# Patient Record
Sex: Male | Born: 1950
Health system: Southern US, Community
[De-identification: ages and names within clinical notes are randomized; demographics above are authoritative.]

## PROBLEM LIST (undated history)

## (undated) DIAGNOSIS — C61 Malignant neoplasm of prostate: Secondary | ICD-10-CM

## (undated) DIAGNOSIS — J189 Pneumonia, unspecified organism: Secondary | ICD-10-CM

## (undated) DIAGNOSIS — E785 Hyperlipidemia, unspecified: Secondary | ICD-10-CM

## (undated) DIAGNOSIS — Z87891 Personal history of nicotine dependence: Secondary | ICD-10-CM

## (undated) DIAGNOSIS — I2510 Atherosclerotic heart disease of native coronary artery without angina pectoris: Secondary | ICD-10-CM

## (undated) DIAGNOSIS — B019 Varicella without complication: Secondary | ICD-10-CM

## (undated) DIAGNOSIS — I219 Acute myocardial infarction, unspecified: Secondary | ICD-10-CM

## (undated) DIAGNOSIS — S022XXA Fracture of nasal bones, initial encounter for closed fracture: Secondary | ICD-10-CM

## (undated) DIAGNOSIS — I10 Essential (primary) hypertension: Secondary | ICD-10-CM

## (undated) DIAGNOSIS — R55 Syncope and collapse: Secondary | ICD-10-CM

## (undated) DIAGNOSIS — E876 Hypokalemia: Secondary | ICD-10-CM

## (undated) DIAGNOSIS — M1990 Unspecified osteoarthritis, unspecified site: Secondary | ICD-10-CM

## (undated) DIAGNOSIS — S14109A Unspecified injury at unspecified level of cervical spinal cord, initial encounter: Secondary | ICD-10-CM

## (undated) DIAGNOSIS — M5430 Sciatica, unspecified side: Secondary | ICD-10-CM

## (undated) DIAGNOSIS — Z800 Family history of malignant neoplasm of digestive organs: Secondary | ICD-10-CM

## (undated) DIAGNOSIS — Z803 Family history of malignant neoplasm of breast: Secondary | ICD-10-CM

## (undated) HISTORY — DX: Pneumonia, unspecified organism: J18.9

## (undated) HISTORY — DX: Atherosclerotic heart disease of native coronary artery without angina pectoris: I25.10

## (undated) HISTORY — DX: Family history of malignant neoplasm of breast: Z80.3

## (undated) HISTORY — DX: Hypokalemia: E87.6

## (undated) HISTORY — DX: Essential (primary) hypertension: I10

## (undated) HISTORY — DX: Hyperlipidemia, unspecified: E78.5

## (undated) HISTORY — DX: Varicella without complication: B01.9

## (undated) HISTORY — DX: Acute myocardial infarction, unspecified: I21.9

## (undated) HISTORY — PX: OTHER SURGICAL HISTORY: SHX169

## (undated) HISTORY — DX: Family history of malignant neoplasm of digestive organs: Z80.0

---

## 1987-08-01 HISTORY — PX: ACHILLES TENDON SURGERY: SHX542

## 1987-08-01 HISTORY — PX: OTHER SURGICAL HISTORY: SHX169

## 1998-06-27 ENCOUNTER — Inpatient Hospital Stay (HOSPITAL_COMMUNITY): Admission: EM | Admit: 1998-06-27 | Discharge: 1998-06-29 | Payer: Self-pay | Admitting: Emergency Medicine

## 1998-06-27 ENCOUNTER — Encounter: Payer: Self-pay | Admitting: Cardiology

## 1998-06-28 HISTORY — PX: CARDIAC CATHETERIZATION: SHX172

## 1998-09-15 ENCOUNTER — Encounter: Payer: Self-pay | Admitting: Cardiovascular Disease

## 1998-09-15 ENCOUNTER — Ambulatory Visit (HOSPITAL_COMMUNITY): Admission: RE | Admit: 1998-09-15 | Discharge: 1998-09-15 | Payer: Self-pay | Admitting: Cardiovascular Disease

## 2001-06-12 ENCOUNTER — Emergency Department (HOSPITAL_COMMUNITY): Admission: EM | Admit: 2001-06-12 | Discharge: 2001-06-12 | Payer: Self-pay | Admitting: Emergency Medicine

## 2001-07-09 ENCOUNTER — Ambulatory Visit (HOSPITAL_COMMUNITY): Admission: RE | Admit: 2001-07-09 | Discharge: 2001-07-09 | Payer: Self-pay | Admitting: Gastroenterology

## 2003-04-07 ENCOUNTER — Emergency Department (HOSPITAL_COMMUNITY): Admission: EM | Admit: 2003-04-07 | Discharge: 2003-04-07 | Payer: Self-pay

## 2008-05-15 ENCOUNTER — Emergency Department (HOSPITAL_BASED_OUTPATIENT_CLINIC_OR_DEPARTMENT_OTHER): Admission: EM | Admit: 2008-05-15 | Discharge: 2008-05-15 | Payer: Self-pay | Admitting: Emergency Medicine

## 2009-05-17 HISTORY — PX: NM MYOVIEW LTD: HXRAD82

## 2010-12-16 NOTE — Procedures (Signed)
[facility]  Patient:    Edward [name], Edward [name] Visit Number: [id number] MRN: [id number]          Service Type: END Location: ENDO Attending Physician:  Louie Bun Dictated by:   Everardo All Madilyn Fireman, M.D. Proc. Date: 07/09/01 Admit Date:  07/09/2001   CC:         Majerus Dull, M.D.   Procedure Report  PROCEDURE:  Colonoscopy.  ENDOSCOPIST:  Everardo All. Madilyn Fireman, M.D.  INDICATIONS:  Rectal bleeding in a 60 year old patient with no previous colon screening.  DESCRIPTION OF PROCEDURE:  The patient was placed in the left lateral decubitus position and placed on the pulse monitor with continuous low flow oxygen delivered by nasal cannula.  He was sedated with 70 mg of IV Demerol and 6 mg of IV Versed.  The Olympus video colonoscope was inserted into the rectum and advanced to the cecum, confirmed by transillumination of McBurneys point and visualization of the ileocecal valve and appendiceal orifice.  The prep was excellent.  The cecum, ascending, transverse, descending and sigmoid colon all appeared normal with no masses, polyps, diverticula or other mucosal abnormalities.  The rectum likewise appeared normal, and retroflexed view of the anus revealed only small internal hemorrhoids.  The colonoscope was then withdrawn, and the patient returned to the recovery room in stable condition. He tolerated the procedure well, and there were no immediate complications.  IMPRESSION:  Small internal hemorrhoids, otherwise normal colonoscopy.  PLAN: Average risk colon cancer screening with Hemoccults and flexible sigmoidoscopy in five years. Dictated by:   Everardo All Madilyn Fireman, M.D. Attending Physician:  Louie Bun DD:  07/09/01 TD:  07/10/01 Job: [id number]

## 2011-02-24 ENCOUNTER — Other Ambulatory Visit: Payer: Self-pay | Admitting: Cardiovascular Disease

## 2011-03-15 ENCOUNTER — Other Ambulatory Visit: Payer: Self-pay | Admitting: Cardiovascular Disease

## 2011-05-02 LAB — COMPREHENSIVE METABOLIC PANEL
ALT: 17
AST: 29
Albumin: 4.2
Alkaline Phosphatase: 52
BUN: 13
CO2: 28
Calcium: 9
Chloride: 102
Creatinine, Ser: 1.1
GFR calc Af Amer: >60
GFR calc non Af Amer: >60
Glucose, Bld: 121 — ABNORMAL HIGH
Potassium: 2.9 — ABNORMAL LOW
Sodium: 139
Total Bilirubin: 0.4
Total Protein: 6.9

## 2011-05-02 LAB — DIFFERENTIAL
Basophils Absolute: 0
Basophils Relative: 1
Eosinophils Absolute: 0.1
Eosinophils Relative: 3
Lymphocytes Relative: 49 — ABNORMAL HIGH
Lymphs Abs: 2.4
Monocytes Absolute: 0.4
Monocytes Relative: 9
Neutro Abs: 1.7
Neutrophils Relative %: 38 — ABNORMAL LOW

## 2011-05-02 LAB — URINALYSIS, ROUTINE W REFLEX MICROSCOPIC
Bilirubin Urine: NEGATIVE
Glucose, UA: NEGATIVE
Hgb urine dipstick: NEGATIVE
Ketones, ur: NEGATIVE
Leukocytes, UA: NEGATIVE
Nitrite: NEGATIVE
Protein, ur: 30 — AB
Specific Gravity, Urine: 1.014
Urobilinogen, UA: 1
pH: 7

## 2011-05-02 LAB — CBC
HCT: 39
Hemoglobin: 13.3
MCHC: 34.2
MCV: 92.1
Platelets: 215
RBC: 4.24
RDW: 12.3
WBC: 4.6

## 2011-05-02 LAB — URINE MICROSCOPIC-ADD ON

## 2011-05-02 LAB — LIPASE, BLOOD: Lipase: 170

## 2011-11-02 ENCOUNTER — Encounter: Payer: Self-pay | Admitting: *Deleted

## 2012-01-05 ENCOUNTER — Encounter: Payer: Self-pay | Admitting: Cardiovascular Disease

## 2012-03-10 ENCOUNTER — Encounter (HOSPITAL_COMMUNITY): Payer: Self-pay | Admitting: Physical Medicine and Rehabilitation

## 2012-03-10 ENCOUNTER — Inpatient Hospital Stay (HOSPITAL_COMMUNITY)
Admission: EM | Admit: 2012-03-10 | Discharge: 2012-03-15 | DRG: 837 | Disposition: A | Discharge status: Skilled Nursing Facility | Payer: Federal, State, Local not specified - PPO | Attending: Neurosurgery | Admitting: Neurosurgery

## 2012-03-10 ENCOUNTER — Emergency Department (HOSPITAL_COMMUNITY): Payer: Federal, State, Local not specified - PPO

## 2012-03-10 DIAGNOSIS — M79609 Pain in unspecified limb: Secondary | ICD-10-CM | POA: Diagnosis present

## 2012-03-10 DIAGNOSIS — S13161A Dislocation of C5/C6 cervical vertebrae, initial encounter: Secondary | ICD-10-CM | POA: Diagnosis present

## 2012-03-10 DIAGNOSIS — Z9861 Coronary angioplasty status: Secondary | ICD-10-CM

## 2012-03-10 DIAGNOSIS — W1809XA Striking against other object with subsequent fall, initial encounter: Secondary | ICD-10-CM | POA: Diagnosis present

## 2012-03-10 DIAGNOSIS — I10 Essential (primary) hypertension: Secondary | ICD-10-CM | POA: Diagnosis present

## 2012-03-10 DIAGNOSIS — Z87891 Personal history of nicotine dependence: Secondary | ICD-10-CM

## 2012-03-10 DIAGNOSIS — S14101A Unspecified injury at C1 level of cervical spinal cord, initial encounter: Principal | ICD-10-CM | POA: Diagnosis present

## 2012-03-10 DIAGNOSIS — R209 Unspecified disturbances of skin sensation: Secondary | ICD-10-CM | POA: Diagnosis present

## 2012-03-10 DIAGNOSIS — E785 Hyperlipidemia, unspecified: Secondary | ICD-10-CM | POA: Diagnosis present

## 2012-03-10 DIAGNOSIS — R55 Syncope and collapse: Secondary | ICD-10-CM | POA: Diagnosis present

## 2012-03-10 DIAGNOSIS — G9520 Unspecified cord compression: Secondary | ICD-10-CM

## 2012-03-10 DIAGNOSIS — S13151A Dislocation of C4/C5 cervical vertebrae, initial encounter: Secondary | ICD-10-CM | POA: Diagnosis present

## 2012-03-10 DIAGNOSIS — S022XXA Fracture of nasal bones, initial encounter for closed fracture: Secondary | ICD-10-CM | POA: Diagnosis present

## 2012-03-10 DIAGNOSIS — Y92009 Unspecified place in unspecified non-institutional (private) residence as the place of occurrence of the external cause: Secondary | ICD-10-CM

## 2012-03-10 DIAGNOSIS — S14105A Unspecified injury at C5 level of cervical spinal cord, initial encounter: Secondary | ICD-10-CM | POA: Diagnosis present

## 2012-03-10 DIAGNOSIS — I2510 Atherosclerotic heart disease of native coronary artery without angina pectoris: Secondary | ICD-10-CM | POA: Diagnosis present

## 2012-03-10 DIAGNOSIS — R197 Diarrhea, unspecified: Secondary | ICD-10-CM | POA: Diagnosis present

## 2012-03-10 DIAGNOSIS — Z8249 Family history of ischemic heart disease and other diseases of the circulatory system: Secondary | ICD-10-CM

## 2012-03-10 DIAGNOSIS — R110 Nausea: Secondary | ICD-10-CM | POA: Diagnosis present

## 2012-03-10 DIAGNOSIS — I252 Old myocardial infarction: Secondary | ICD-10-CM

## 2012-03-10 DIAGNOSIS — M47812 Spondylosis without myelopathy or radiculopathy, cervical region: Secondary | ICD-10-CM | POA: Diagnosis present

## 2012-03-10 HISTORY — DX: Personal history of nicotine dependence: Z87.891

## 2012-03-10 HISTORY — DX: Fracture of nasal bones, initial encounter for closed fracture: S02.2XXA

## 2012-03-10 HISTORY — DX: Syncope and collapse: R55

## 2012-03-10 HISTORY — DX: Unspecified injury at unspecified level of cervical spinal cord, initial encounter: S14.109A

## 2012-03-10 LAB — CBC WITH DIFFERENTIAL/PLATELET
Basophils Absolute: 0 10*3/uL (ref 0.0–0.1)
Basophils Relative: 0 % (ref 0–1)
Eosinophils Absolute: 0.1 10*3/uL (ref 0.0–0.7)
Eosinophils Relative: 1 % (ref 0–5)
HCT: 38.5 % — ABNORMAL LOW (ref 39.0–52.0)
Hemoglobin: 13.5 g/dL (ref 13.0–17.0)
Lymphocytes Relative: 21 % (ref 12–46)
Lymphs Abs: 1.3 10*3/uL (ref 0.7–4.0)
MCH: 31.8 pg (ref 26.0–34.0)
MCHC: 35.1 g/dL (ref 30.0–36.0)
MCV: 90.8 fL (ref 78.0–100.0)
Monocytes Absolute: 0.5 10*3/uL (ref 0.1–1.0)
Monocytes Relative: 7 % (ref 3–12)
Neutro Abs: 4.3 10*3/uL (ref 1.7–7.7)
Neutrophils Relative %: 70 % (ref 43–77)
Platelets: 185 10*3/uL (ref 150–400)
RBC: 4.24 MIL/uL (ref 4.22–5.81)
RDW: 12.6 % (ref 11.5–15.5)
WBC: 6.1 10*3/uL (ref 4.0–10.5)

## 2012-03-10 LAB — COMPREHENSIVE METABOLIC PANEL
ALT: 16 U/L (ref 0–53)
AST: 26 U/L (ref 0–37)
Albumin: 3.8 g/dL (ref 3.5–5.2)
Alkaline Phosphatase: 39 U/L (ref 39–117)
BUN: 16 mg/dL (ref 6–23)
CO2: 23 mEq/L (ref 19–32)
Calcium: 9.3 mg/dL (ref 8.4–10.5)
Chloride: 106 mEq/L (ref 96–112)
Creatinine, Ser: 1.35 mg/dL (ref 0.50–1.35)
GFR calc Af Amer: 63 mL/min — ABNORMAL LOW (ref >90)
GFR calc non Af Amer: 55 mL/min — ABNORMAL LOW (ref >90)
Glucose, Bld: 117 mg/dL — ABNORMAL HIGH (ref 70–99)
Potassium: 3.9 mEq/L (ref 3.5–5.1)
Sodium: 138 mEq/L (ref 135–145)
Total Bilirubin: 0.5 mg/dL (ref 0.3–1.2)
Total Protein: 6.7 g/dL (ref 6.0–8.3)

## 2012-03-10 LAB — POCT I-STAT TROPONIN I: Troponin i, poc: 0.01 ng/mL (ref 0.00–0.08)

## 2012-03-10 LAB — TROPONIN I: Troponin I: <0.3 ng/mL (ref <0.30)

## 2012-03-10 MED ORDER — PHENOL 1.4 % MT LIQD: 1.0000 | OROMUCOSAL | Status: DC | PRN

## 2012-03-10 MED ORDER — KCL IN DEXTROSE-NACL 20-5-0.45 MEQ/L-%-% IV SOLN
INTRAVENOUS | Status: DC
  Administered 2012-03-10: 23:00:00 via INTRAVENOUS
  Filled 2012-03-10 (×6): qty 1000

## 2012-03-10 MED ORDER — NITROGLYCERIN 0.4 MG SL SUBL: 0.4000 mg | SUBLINGUAL_TABLET | SUBLINGUAL | Status: DC | PRN

## 2012-03-10 MED ORDER — HYDROCODONE-ACETAMINOPHEN 5-325 MG PO TABS: 1.0000 | ORAL_TABLET | ORAL | Status: DC | PRN

## 2012-03-10 MED ORDER — ASPIRIN EC 81 MG PO TBEC
81.0000 mg | DELAYED_RELEASE_TABLET | Freq: Every day | ORAL | Status: DC
  Administered 2012-03-11 – 2012-03-12 (×2): 81 mg via ORAL
  Filled 2012-03-10 (×3): qty 1

## 2012-03-10 MED ORDER — ADULT MULTIVITAMIN W/MINERALS CH
1.0000 | ORAL_TABLET | Freq: Every day | ORAL | Status: DC
  Administered 2012-03-11 – 2012-03-12 (×2): 1 via ORAL
  Filled 2012-03-10 (×3): qty 1

## 2012-03-10 MED ORDER — DEXAMETHASONE 4 MG PO TABS
4.0000 mg | ORAL_TABLET | Freq: Four times a day (QID) | ORAL | Status: DC
  Administered 2012-03-11 – 2012-03-12 (×7): 4 mg via ORAL
  Filled 2012-03-10 (×16): qty 1

## 2012-03-10 MED ORDER — SODIUM CHLORIDE 0.9 % IJ SOLN: 3.0000 mL | INTRAMUSCULAR | Status: DC | PRN

## 2012-03-10 MED ORDER — HYDROCHLOROTHIAZIDE 25 MG PO TABS
25.0000 mg | ORAL_TABLET | Freq: Every day | ORAL | Status: DC
  Administered 2012-03-11: 25 mg via ORAL
  Filled 2012-03-10 (×2): qty 1

## 2012-03-10 MED ORDER — HYDROMORPHONE HCL PF 1 MG/ML IJ SOLN
1.0000 mg | Freq: Once | INTRAMUSCULAR | Status: AC
  Administered 2012-03-10: 1 mg via INTRAVENOUS
  Filled 2012-03-10: qty 1

## 2012-03-10 MED ORDER — LISINOPRIL-HYDROCHLOROTHIAZIDE 20-25 MG PO TABS: 1.0000 | ORAL_TABLET | Freq: Every day | ORAL | Status: DC

## 2012-03-10 MED ORDER — TETANUS-DIPHTH-ACELL PERTUSSIS 5-2.5-18.5 LF-MCG/0.5 IM SUSP
0.5000 mL | Freq: Once | INTRAMUSCULAR | Status: AC
  Administered 2012-03-10: 0.5 mL via INTRAMUSCULAR
  Filled 2012-03-10: qty 0.5

## 2012-03-10 MED ORDER — POTASSIUM CHLORIDE CRYS ER 10 MEQ PO TBCR
10.0000 meq | EXTENDED_RELEASE_TABLET | Freq: Every day | ORAL | Status: DC
  Administered 2012-03-11 – 2012-03-12 (×2): 10 meq via ORAL
  Filled 2012-03-10 (×3): qty 1

## 2012-03-10 MED ORDER — ONDANSETRON HCL 4 MG/2ML IJ SOLN
4.0000 mg | Freq: Once | INTRAMUSCULAR | Status: AC
  Administered 2012-03-10: 4 mg via INTRAVENOUS
  Filled 2012-03-10: qty 2

## 2012-03-10 MED ORDER — ACETAMINOPHEN 325 MG PO TABS: 650.0000 mg | ORAL_TABLET | ORAL | Status: DC | PRN

## 2012-03-10 MED ORDER — DIAZEPAM 5 MG PO TABS: 5.0000 mg | ORAL_TABLET | Freq: Four times a day (QID) | ORAL | Status: DC | PRN

## 2012-03-10 MED ORDER — PANTOPRAZOLE SODIUM 40 MG IV SOLR
40.0000 mg | Freq: Every day | INTRAVENOUS | Status: DC
  Administered 2012-03-11 (×2): 40 mg via INTRAVENOUS
  Filled 2012-03-10 (×3): qty 40

## 2012-03-10 MED ORDER — MORPHINE SULFATE 2 MG/ML IJ SOLN
1.0000 mg | INTRAMUSCULAR | Status: DC | PRN
  Administered 2012-03-10: 2 mg via INTRAVENOUS
  Administered 2012-03-11: 4 mg via INTRAVENOUS
  Administered 2012-03-11: 2 mg via INTRAVENOUS
  Administered 2012-03-11: 4 mg via INTRAVENOUS
  Administered 2012-03-11 (×2): 2 mg via INTRAVENOUS
  Administered 2012-03-11: 4 mg via INTRAVENOUS
  Administered 2012-03-12: 2 mg via INTRAVENOUS
  Administered 2012-03-12 – 2012-03-13 (×4): 4 mg via INTRAVENOUS
  Filled 2012-03-10: qty 2
  Filled 2012-03-10: qty 1
  Filled 2012-03-10 (×3): qty 2
  Filled 2012-03-10 (×2): qty 1
  Filled 2012-03-10: qty 2
  Filled 2012-03-10: qty 1
  Filled 2012-03-10 (×4): qty 2
  Filled 2012-03-10: qty 1
  Filled 2012-03-10: qty 2

## 2012-03-10 MED ORDER — SODIUM CHLORIDE 0.9 % IV SOLN: 250.0000 mL | INTRAVENOUS | Status: DC

## 2012-03-10 MED ORDER — MORPHINE SULFATE 4 MG/ML IJ SOLN
4.0000 mg | Freq: Once | INTRAMUSCULAR | Status: AC
  Administered 2012-03-10: 4 mg via INTRAVENOUS
  Filled 2012-03-10: qty 1

## 2012-03-10 MED ORDER — SODIUM CHLORIDE 0.9 % IJ SOLN
3.0000 mL | Freq: Two times a day (BID) | INTRAMUSCULAR | Status: DC
  Administered 2012-03-11 – 2012-03-12 (×4): 3 mL via INTRAVENOUS

## 2012-03-10 MED ORDER — SODIUM CHLORIDE 0.9 % IV BOLUS (SEPSIS)
1000.0000 mL | Freq: Once | INTRAVENOUS | Status: AC
  Administered 2012-03-10: 1000 mL via INTRAVENOUS

## 2012-03-10 MED ORDER — MENTHOL 3 MG MT LOZG: 1.0000 | LOZENGE | OROMUCOSAL | Status: DC | PRN

## 2012-03-10 MED ORDER — ATORVASTATIN CALCIUM 40 MG PO TABS
40.0000 mg | ORAL_TABLET | Freq: Every day | ORAL | Status: DC
  Administered 2012-03-11 – 2012-03-12 (×2): 40 mg via ORAL
  Filled 2012-03-10 (×3): qty 1

## 2012-03-10 MED ORDER — ONDANSETRON HCL 4 MG/2ML IJ SOLN: 4.0000 mg | INTRAMUSCULAR | Status: DC | PRN

## 2012-03-10 MED ORDER — OXYCODONE-ACETAMINOPHEN 5-325 MG PO TABS
1.0000 | ORAL_TABLET | ORAL | Status: DC | PRN
  Administered 2012-03-11 – 2012-03-12 (×6): 2 via ORAL
  Filled 2012-03-10 (×6): qty 2

## 2012-03-10 MED ORDER — DEXAMETHASONE SODIUM PHOSPHATE 4 MG/ML IJ SOLN
4.0000 mg | Freq: Four times a day (QID) | INTRAMUSCULAR | Status: DC
  Administered 2012-03-12 – 2012-03-13 (×3): 4 mg via INTRAVENOUS
  Filled 2012-03-10 (×12): qty 1

## 2012-03-10 MED ORDER — ACETAMINOPHEN 650 MG RE SUPP: 650.0000 mg | RECTAL | Status: DC | PRN

## 2012-03-10 MED ORDER — METOPROLOL SUCCINATE ER 50 MG PO TB24
50.0000 mg | ORAL_TABLET | Freq: Every day | ORAL | Status: DC
  Administered 2012-03-11 – 2012-03-12 (×2): 50 mg via ORAL
  Filled 2012-03-10 (×3): qty 1

## 2012-03-10 MED ORDER — LISINOPRIL 20 MG PO TABS
20.0000 mg | ORAL_TABLET | Freq: Every day | ORAL | Status: DC
  Administered 2012-03-11: 20 mg via ORAL
  Filled 2012-03-10 (×2): qty 1

## 2012-03-10 MED ORDER — AMLODIPINE BESYLATE 5 MG PO TABS
5.0000 mg | ORAL_TABLET | Freq: Every day | ORAL | Status: DC
  Administered 2012-03-11 – 2012-03-12 (×2): 5 mg via ORAL
  Filled 2012-03-10 (×3): qty 1

## 2012-03-10 NOTE — ED Notes (Addendum)
Pt presents to department for evaluation of fall. States he became lightheaded then fell and hit the corner of the bathtub. States "I felt my legs go out on me." pt also states he was incontinent of urine and stool. Upon arrival he is conscious alert and oriented x4. V-shaped laceration to bridge of nose, bleeding controlled. Also states pain and tingling to both hands. c-collar remains in place at the time. 5/10 pain at present.

## 2012-03-10 NOTE — ED Provider Notes (Addendum)
History     CSN: [id number]  Arrival date & time 03/10/12  1422   First MD Initiated Contact with Patient 03/10/12 1423      Chief Complaint  Patient presents with  . Fall    (Consider location/radiation/quality/duration/timing/severity/associated sxs/prior treatment) Patient is a 61 y.o. male presenting with fall and diarrhea. The history is provided by the patient and the spouse.  Fall The accident occurred less than 1 hour ago. The fall occurred while walking (felt dizzy and fell with possible LOC). He fell from a height of 1 to 2 ft. Impact surface: hit his face on the wooden rail of the bed. The volume of blood lost was minimal. The point of impact was the head and neck. The pain is present in the head and neck (bilateral hands). The pain is at a severity of 6/10. The pain is moderate. He was not ambulatory at the scene. Associated symptoms include nausea and tingling. Pertinent negatives include no fever and no abdominal pain. Associated symptoms comments: Possible LOC. The symptoms are aggravated by standing. Treatment on scene includes a c-collar and a backboard. He has tried nothing for the symptoms. The treatment provided no relief.  Diarrhea The primary symptoms include nausea and diarrhea. Primary symptoms do not include fever or abdominal pain. The illness began yesterday. The onset was sudden. The problem has not changed since onset. The diarrhea is watery. The diarrhea occurs 5 to 10 times per day.  The illness is also significant for anorexia. The illness does not include chills.    Past Medical History  Diagnosis Date  . Measles   . Mumps   . Chicken pox   . Whooping cough   . Pneumonia   . Hypertension   . Coronary artery disease   . Myocardial infarction   . Dyslipidemia   . Hypokalemia     Past Surgical History  Procedure Date  . [place] tendon surgery 1989  . Achilles tendon surgery 1989  . Cardiac catheterization 06/28/1998    single vessel CAD  involving the distal RCA/PTCA and stenting of the distal RCA//EF- 50-55%  . Nm myoview ltd 05/17/2009    normal stress nuclear study/no evidence of ischemia/EF- 68%    Family History  Problem Relation Age of Onset  . Heart attack Father   . Cancer Mother     History  Substance Use Topics  . Smoking status: Former Smoker    Types: Cigarettes    Quit date: 11/02/1986  . Smokeless tobacco: Not on file  . Alcohol Use: Yes      Review of Systems  Constitutional: Negative for fever and chills.  Gastrointestinal: Positive for nausea, diarrhea and anorexia. Negative for abdominal pain.  Neurological: Positive for dizziness and tingling.       Numbness in feet and pain in the hands  All other systems reviewed and are negative.    Allergies  Review of patient's allergies indicates no known allergies.  Home Medications   Current Outpatient Rx  Name Route Sig Dispense Refill  . AMLODIPINE BESYLATE 5 MG PO TABS Oral Take 5 mg by mouth daily.    . ASPIRIN 81 MG PO TABS Oral Take 81 mg by mouth daily.    . OMEGA-3 FATTY ACIDS 1000 MG PO CAPS Oral Take 3 g by mouth daily. Take three tablets by mouth once daily    . LISINOPRIL-HYDROCHLOROTHIAZIDE 20-25 MG PO TABS Oral Take 1 tablet by mouth daily.    Marland Kitchen METOPROLOL SUCCINATE ER  50 MG PO TB24 Oral Take 50 mg by mouth daily. Take with or immediately following a meal.    . ONE-DAILY MULTI VITAMINS PO TABS Oral Take 1 tablet by mouth daily.    Marland Kitchen POTASSIUM CHLORIDE CRYS ER 10 MEQ PO TBCR Oral Take 10 mEq by mouth daily.    Marland Kitchen SIMVASTATIN 80 MG PO TABS Oral Take 80 mg by mouth at bedtime.    Marland Kitchen NITROGLYCERIN 0.4 MG SL SUBL  place 1 tablet under the tongue if needed for chest pain 25 tablet PRN    BP 100/60  Pulse 61  Temp 97.7 F (36.5 C) (Oral)  Resp 19  SpO2 99%  Physical Exam  Nursing note and vitals reviewed. Constitutional: He is oriented to person, place, and time. He appears well-developed and well-nourished. No distress.    HENT:  Head: Normocephalic. Head is with laceration.    Nose: Sinus tenderness present.  Mouth/Throat: Oropharynx is clear and moist.       Dried blood from bilateral epistasis  Eyes: Conjunctivae and EOM are normal. Pupils are equal, round, and reactive to light.  Neck: Trachea normal. Spinous process tenderness present.  Cardiovascular: Normal rate, regular rhythm and intact distal pulses.   No murmur heard. Pulmonary/Chest: Effort normal and breath sounds normal. No respiratory distress. He has no wheezes. He has no rales.  Abdominal: Soft. He exhibits no distension. There is no tenderness. There is no rebound and no guarding.  Musculoskeletal: Normal range of motion. He exhibits no edema and no tenderness.       Right ankle: He exhibits normal pulse.       Left ankle: He exhibits normal pulse.       Cervical back: He exhibits bony tenderness.       2+ radial pulses bilaterally.  Normal cap refill.  c-collar in place  Neurological: He is alert and oriented to person, place, and time. He has normal strength. No sensory deficit.       Pt c/o of numbness in the feet but gross sensation intact bilaterally  Skin: Skin is warm and dry. No rash noted. No erythema.  Psychiatric: He has a normal mood and affect. His behavior is normal.    ED Course  Procedures (including critical care time)  Labs Reviewed  CBC WITH DIFFERENTIAL - Abnormal; Notable for the following:    HCT 38.5 (*)     All other components within normal limits  COMPREHENSIVE METABOLIC PANEL - Abnormal; Notable for the following:    Glucose, Bld 117 (*)     GFR calc non Af Amer 55 (*)     GFR calc Af Amer 63 (*)     All other components within normal limits  TROPONIN I   Ct Head Wo Contrast  03/10/2012  *RADIOLOGY REPORT*  Clinical Data:  Fall, head injury, nose laceration, history of seizures  CT HEAD WITHOUT CONTRAST CT MAXILLOFACIAL WITHOUT CONTRAST CT CERVICAL SPINE WITHOUT CONTRAST  Technique:  Multidetector CT  imaging of the head, cervical spine, and maxillofacial structures were performed using the standard protocol without intravenous contrast. Multiplanar CT image reconstructions of the cervical spine and maxillofacial structures were also generated.  Comparison:  None.  CT HEAD  Findings: No evidence of parenchymal hemorrhage or extra-axial fluid collection. No mass lesion, mass effect, or midline shift.  No CT evidence of acute infarction.  Subcortical white matter and periventricular small vessel ischemic changes.  Cerebral volume is age appropriate.  No ventriculomegaly.  The visualized  paranasal sinuses are essentially clear. The mastoid air cells are unopacified.  No evidence of calvarial fracture.  IMPRESSION: No evidence of acute intracranial abnormality.  Small vessel ischemic changes.  See below for maxillofacial findings.  CT MAXILLOFACIAL  Findings:  Comminuted, depressed bilateral nasal bone fractures. Additional fractures of the anterior/mid nasal septum, which bows to the right.  Overlying soft tissue swelling/laceration of the nasal bridge.  Small amount of layering fluid/hemorrhage in the left sphenoid sinus (series 4/image 59).  The visualized paranasal sinuses are otherwise clear.  The mastoid air cells are unopacified.  The bilateral orbits, including the retroconal soft tissues, are within normal limits.  Small bilateral cervical lymph nodes.  6 mm intraparotid lymph node on the left (series 3/image 38).  IMPRESSION: Comminuted, depressed bilateral nasal bone fractures.  Additional fractures of the anterior/mid nasal septum, which bows to the right.  Overlying soft tissue swelling/laceration of the nasal bridge.  CT CERVICAL SPINE  Findings:   Straightening of the cervical spine, positive positional.  No evidence of fracture or dislocation.  Vertebral body heights are maintained.  The dens appears intact.  No prevertebral soft tissue swelling.  Moderate multilevel degenerative changes.  Visualized  thyroid is unremarkable.  Visualized lung apices are clear.  IMPRESSION: No evidence of traumatic injury to the cervical spine.  Moderate multilevel degenerative changes.  Original Report Authenticated By: Charline Bills, M.D.   Ct Cervical Spine Wo Contrast  03/10/2012  *RADIOLOGY REPORT*  Clinical Data:  Fall, head injury, nose laceration, history of seizures  CT HEAD WITHOUT CONTRAST CT MAXILLOFACIAL WITHOUT CONTRAST CT CERVICAL SPINE WITHOUT CONTRAST  Technique:  Multidetector CT imaging of the head, cervical spine, and maxillofacial structures were performed using the standard protocol without intravenous contrast. Multiplanar CT image reconstructions of the cervical spine and maxillofacial structures were also generated.  Comparison:  None.  CT HEAD  Findings: No evidence of parenchymal hemorrhage or extra-axial fluid collection. No mass lesion, mass effect, or midline shift.  No CT evidence of acute infarction.  Subcortical white matter and periventricular small vessel ischemic changes.  Cerebral volume is age appropriate.  No ventriculomegaly.  The visualized paranasal sinuses are essentially clear. The mastoid air cells are unopacified.  No evidence of calvarial fracture.  IMPRESSION: No evidence of acute intracranial abnormality.  Small vessel ischemic changes.  See below for maxillofacial findings.  CT MAXILLOFACIAL  Findings:  Comminuted, depressed bilateral nasal bone fractures. Additional fractures of the anterior/mid nasal septum, which bows to the right.  Overlying soft tissue swelling/laceration of the nasal bridge.  Small amount of layering fluid/hemorrhage in the left sphenoid sinus (series 4/image 59).  The visualized paranasal sinuses are otherwise clear.  The mastoid air cells are unopacified.  The bilateral orbits, including the retroconal soft tissues, are within normal limits.  Small bilateral cervical lymph nodes.  6 mm intraparotid lymph node on the left (series 3/image 38).   IMPRESSION: Comminuted, depressed bilateral nasal bone fractures.  Additional fractures of the anterior/mid nasal septum, which bows to the right.  Overlying soft tissue swelling/laceration of the nasal bridge.  CT CERVICAL SPINE  Findings:   Straightening of the cervical spine, positive positional.  No evidence of fracture or dislocation.  Vertebral body heights are maintained.  The dens appears intact.  No prevertebral soft tissue swelling.  Moderate multilevel degenerative changes.  Visualized thyroid is unremarkable.  Visualized lung apices are clear.  IMPRESSION: No evidence of traumatic injury to the cervical spine.  Moderate multilevel degenerative changes.  Original Report Authenticated By: Charline Bills, M.D.   Ct Maxillofacial Wo Cm  03/10/2012  *RADIOLOGY REPORT*  Clinical Data:  Fall, head injury, nose laceration, history of seizures  CT HEAD WITHOUT CONTRAST CT MAXILLOFACIAL WITHOUT CONTRAST CT CERVICAL SPINE WITHOUT CONTRAST  Technique:  Multidetector CT imaging of the head, cervical spine, and maxillofacial structures were performed using the standard protocol without intravenous contrast. Multiplanar CT image reconstructions of the cervical spine and maxillofacial structures were also generated.  Comparison:  None.  CT HEAD  Findings: No evidence of parenchymal hemorrhage or extra-axial fluid collection. No mass lesion, mass effect, or midline shift.  No CT evidence of acute infarction.  Subcortical white matter and periventricular small vessel ischemic changes.  Cerebral volume is age appropriate.  No ventriculomegaly.  The visualized paranasal sinuses are essentially clear. The mastoid air cells are unopacified.  No evidence of calvarial fracture.  IMPRESSION: No evidence of acute intracranial abnormality.  Small vessel ischemic changes.  See below for maxillofacial findings.  CT MAXILLOFACIAL  Findings:  Comminuted, depressed bilateral nasal bone fractures. Additional fractures of the  anterior/mid nasal septum, which bows to the right.  Overlying soft tissue swelling/laceration of the nasal bridge.  Small amount of layering fluid/hemorrhage in the left sphenoid sinus (series 4/image 59).  The visualized paranasal sinuses are otherwise clear.  The mastoid air cells are unopacified.  The bilateral orbits, including the retroconal soft tissues, are within normal limits.  Small bilateral cervical lymph nodes.  6 mm intraparotid lymph node on the left (series 3/image 38).  IMPRESSION: Comminuted, depressed bilateral nasal bone fractures.  Additional fractures of the anterior/mid nasal septum, which bows to the right.  Overlying soft tissue swelling/laceration of the nasal bridge.  CT CERVICAL SPINE  Findings:   Straightening of the cervical spine, positive positional.  No evidence of fracture or dislocation.  Vertebral body heights are maintained.  The dens appears intact.  No prevertebral soft tissue swelling.  Moderate multilevel degenerative changes.  Visualized thyroid is unremarkable.  Visualized lung apices are clear.  IMPRESSION: No evidence of traumatic injury to the cervical spine.  Moderate multilevel degenerative changes.  Original Report Authenticated By: Charline Bills, M.D.     Date: 03/10/2012  Rate: 63  Rhythm: normal sinus rhythm  QRS Axis: normal  Intervals: normal  ST/T Wave abnormalities: normal  Conduction Disutrbances: none  Narrative Interpretation: unremarkable      No diagnosis found.    MDM   Patient with an episode of near-syncope today that occurred after he felt dizzy. Patient has had diarrhea for the last 24 hours and was coming back from the bathroom when he started to feel lightheaded and dizzy. He abruptly fell hitting his face on the bed. His wife was present and states that he was able to speak with her at all times however when EMS arrived patient was mildly confused and had urinated on himself. Wife states that in the past he has had 2  seizures with hypokalemia. Patient denies any chest pain or shortness of breath but is complaining of bilateral and pain and states his feet feel numb. When EMS arrived the patient was mildly hypotensive which has resolved with IV fluids. EKG is within normal limits. Bilateral extremities have normal capillary refill and pulses without any evidence of injury and unclear why they are painful. Concern for possible c-spine injury. Concern for possible electrolyte abnormality versus dehydration and hypotension causing near syncope. Did not appear to be a cardiac cause as patient  had no chest pain, shortness of breath has a normal EKG.  CBC, CMP, troponin, CT of the head and neck pending.  Patient given IV fluids, Zofran and morphine.  If CT neg then will get MRI.  3:50 PM All labs wnl.  Plain films showed comminuted nasal bone fracture but neg head and neck.  However based on the symptoms will get MRI of the spine for further evaluation.    Gwyneth Sprout, MD 03/10/12 1559  Gwyneth Sprout, MD 03/10/12 [phone]

## 2012-03-10 NOTE — ED Notes (Signed)
Pt remains in CT scan, feeling nauseated at the time. RN to CT to medicate.

## 2012-03-10 NOTE — ED Notes (Addendum)
Placed aspen collar to c-spine per neurosurgeon with assistance of Magda Paganini, [employer]. Pt tolerated well and reports comfortable fit.

## 2012-03-10 NOTE — ED Notes (Signed)
Report called to Magda Paganini, [employer] fund[employer]. Pt to be moved to CDU 3.

## 2012-03-10 NOTE — ED Notes (Signed)
Neuro MD at bedside

## 2012-03-10 NOTE — ED Notes (Signed)
Pt presents to department via [facility] for evaluation of fall. Pt was walking out of bathroom when he fell and stuck head on bed. States he became lightheaded then fell to floor. Bladder and bowel incontinence. History of x2 seizures related to hypokalemia. Pt is alert and able to answer questions upon arrival to ED. 18g L forearm. Received NS, BP 100/64. CBG 135. States pain to both hands.

## 2012-03-10 NOTE — H&P (Signed)
History    Patient is a 61 y.o. male presenting with fall and diarrhea. The history is provided by the patient and the spouse.  Fall  The accident occurred less than 1 hour ago. The fall occurred while walking (felt dizzy and fell with possible LOC). He fell from a height of 1 to 2 ft. Impact surface: hit his face on the wooden rail of the bed. The volume of blood lost was minimal. The point of impact was the head and neck. The pain is present in the head and neck (bilateral hands). The pain is at a severity of 6/10. The pain is moderate. He was not ambulatory at the scene. Associated symptoms include nausea and tingling. Pertinent negatives include no fever and no abdominal pain. Associated symptoms comments: Possible LOC. The symptoms are aggravated by standing. Treatment on scene includes a c-collar and a backboard. He has tried nothing for the symptoms. The treatment provided no relief.  Diarrhea  The primary symptoms include nausea and diarrhea. Primary symptoms do not include fever or abdominal pain. The illness began yesterday. The onset was sudden. The problem has not changed since onset.  The diarrhea is watery. The diarrhea occurs 5 to 10 times per day.  The illness is also significant for anorexia. The illness does not include chills.   Past Medical History   Diagnosis  Date   .  Measles    .  Mumps    .  Chicken pox    .  Whooping cough    .  Pneumonia    .  Hypertension    .  Coronary artery disease    .  Myocardial infarction    .  Dyslipidemia    .  Hypokalemia     Past Surgical History   Procedure  Date   .  [place] tendon surgery  1989   .  Achilles tendon surgery  1989   .  Cardiac catheterization  06/28/1998     single vessel CAD involving the distal RCA/PTCA and stenting of the distal RCA//EF- 50-55%   .  [facility] myoview ltd  05/17/2009     normal stress nuclear study/no evidence of ischemia/EF- 68%    Family History   Problem  Relation  Age of Onset   .  Heart attack   Father    .  Cancer  Mother     History   Substance Use Topics   .  Smoking status:  Former Smoker     Types:  Cigarettes     Quit date:  11/02/1986   .  Smokeless tobacco:  Not on file   .  Alcohol Use:  Yes     Review of Systems  Constitutional: Negative for fever and chills.  Gastrointestinal: Positive for nausea, diarrhea and anorexia. Negative for abdominal pain.  Neurological: Positive for dizziness and tingling.  Numbness in feet and pain in the hands  All other systems reviewed and are negative.   Allergies   Review of patient's allergies indicates no known allergies.  Home Medications    Current Outpatient Rx   Name  Route  Sig  Dispense  Refill   .  AMLODIPINE BESYLATE 5 MG PO TABS  Oral  Take 5 mg by mouth daily.     .  ASPIRIN 81 MG PO TABS  Oral  Take 81 mg by mouth daily.     .  OMEGA-3 FATTY ACIDS 1000 MG PO CAPS  Oral  Take 3 g  by mouth daily. Take three tablets by mouth once daily     .  LISINOPRIL-HYDROCHLOROTHIAZIDE 20-25 MG PO TABS  Oral  Take 1 tablet by mouth daily.     Marland Kitchen  METOPROLOL SUCCINATE ER 50 MG PO TB24  Oral  Take 50 mg by mouth daily. Take with or immediately following a meal.     .  ONE-DAILY MULTI VITAMINS PO TABS  Oral  Take 1 tablet by mouth daily.     Marland Kitchen  POTASSIUM CHLORIDE CRYS ER 10 MEQ PO TBCR  Oral  Take 10 mEq by mouth daily.     Marland Kitchen  SIMVASTATIN 80 MG PO TABS  Oral  Take 80 mg by mouth at bedtime.     Marland Kitchen  NITROGLYCERIN 0.4 MG SL SUBL   place 1 tablet under the tongue if needed for chest pain  25 tablet  PRN    BP 100/60  Pulse 61  Temp 97.7 F (36.5 C) (Oral)  Resp 19  SpO2 99%  Physical Exam  Nursing note and vitals reviewed.  Constitutional: He is oriented to person, place, and time. He appears well-developed and well-nourished. No distress.  HENT:  Head: Normocephalic. Head is with laceration, which has been sutured. Neck is nontender, currently immobilized in a cervical collar. Nose: Sinus tenderness present.  Mouth/Throat:  Oropharynx is clear and moist.  Dried blood from bilateral epistasis  Eyes: Conjunctivae and EOM are normal. Pupils are equal, round, and reactive to light.  Neck: Trachea normal. Spinous process tenderness present.  Cardiovascular: Normal rate, regular rhythm and intact distal pulses.  No murmur heard.  Pulmonary/Chest: Effort normal and breath sounds normal. No respiratory distress. He has no wheezes. He has no rales.  Abdominal: Soft. He exhibits no distension. There is no tenderness. There is no rebound and no guarding.  Musculoskeletal: Normal range of motion. He exhibits no edema and no tenderness.  Right ankle: He exhibits normal pulse.  Left ankle: He exhibits normal pulse.  Cervical back: He exhibits minimal bony tenderness.  2+ radial pulses bilaterally. Normal cap refill. c-collar in place  Neurological: He is alert and oriented to person, place, and time. He has full strength in all motor groups with the exception of bilateral triceps 0/5 and hand intrinsics (also 0/5).  He has no movement in either hand.  He cannot grip.  His lower extremity strength is full in all motor groups.  He notes numbness in both arms, most densely in the hands.  He describes this as burning and also that he has "poor circulation." He denies numbness in his lower extremities gross sensation intact bilaterally. Rectal tone and perineal sensation is normal. Skin: Skin is warm and dry. No rash noted. No erythema.  Psychiatric: He has a normal mood and affect. His behavior is normal.   ED Course   Procedures (including critical care time)  Labs Reviewed   CBC WITH DIFFERENTIAL - Abnormal; Notable for the following:    HCT  38.5 (*)      All other components within normal limits   COMPREHENSIVE METABOLIC PANEL - Abnormal; Notable for the following:    Glucose, Bld  117 (*)      GFR calc non Af Amer  55 (*)      GFR calc Af Amer  63 (*)      All other components within normal limits   TROPONIN I    Ct  Head Wo Contrast  03/10/2012 *RADIOLOGY REPORT* Clinical Data: Fall, head injury,  nose laceration, history of seizures CT HEAD WITHOUT CONTRAST CT MAXILLOFACIAL WITHOUT CONTRAST CT CERVICAL SPINE WITHOUT CONTRAST Technique: Multidetector CT imaging of the head, cervical spine, and maxillofacial structures were performed using the standard protocol without intravenous contrast. Multiplanar CT image reconstructions of the cervical spine and maxillofacial structures were also generated. Comparison: None. CT HEAD Findings: No evidence of parenchymal hemorrhage or extra-axial fluid collection. No mass lesion, mass effect, or midline shift. No CT evidence of acute infarction. Subcortical white matter and periventricular small vessel ischemic changes. Cerebral volume is age appropriate. No ventriculomegaly. The visualized paranasal sinuses are essentially clear. The mastoid air cells are unopacified. No evidence of calvarial fracture. IMPRESSION: No evidence of acute intracranial abnormality. Small vessel ischemic changes. See below for maxillofacial findings. CT MAXILLOFACIAL Findings: Comminuted, depressed bilateral nasal bone fractures. Additional fractures of the anterior/mid nasal septum, which bows to the right. Overlying soft tissue swelling/laceration of the nasal bridge. Small amount of layering fluid/hemorrhage in the left sphenoid sinus (series 4/image 59). The visualized paranasal sinuses are otherwise clear. The mastoid air cells are unopacified. The bilateral orbits, including the retroconal soft tissues, are within normal limits. Small bilateral cervical lymph nodes. 6 mm intraparotid lymph node on the left (series 3/image 38). IMPRESSION: Comminuted, depressed bilateral nasal bone fractures. Additional fractures of the anterior/mid nasal septum, which bows to the right. Overlying soft tissue swelling/laceration of the nasal bridge. CT CERVICAL SPINE Findings: Straightening of the cervical spine, positive  positional. No evidence of fracture or dislocation. Vertebral body heights are maintained. The dens appears intact. No prevertebral soft tissue swelling. Moderate multilevel degenerative changes. Visualized thyroid is unremarkable. Visualized lung apices are clear. IMPRESSION: No evidence of traumatic injury to the cervical spine. Moderate multilevel degenerative changes. Original Report Authenticated By: Charline Bills, M.D.  Ct Cervical Spine Wo Contrast  03/10/2012 *RADIOLOGY REPORT* Clinical Data: Fall, head injury, nose laceration, history of seizures CT HEAD WITHOUT CONTRAST CT MAXILLOFACIAL WITHOUT CONTRAST CT CERVICAL SPINE WITHOUT CONTRAST Technique: Multidetector CT imaging of the head, cervical spine, and maxillofacial structures were performed using the standard protocol without intravenous contrast. Multiplanar CT image reconstructions of the cervical spine and maxillofacial structures were also generated. Comparison: None. CT HEAD Findings: No evidence of parenchymal hemorrhage or extra-axial fluid collection. No mass lesion, mass effect, or midline shift. No CT evidence of acute infarction. Subcortical white matter and periventricular small vessel ischemic changes. Cerebral volume is age appropriate. No ventriculomegaly. The visualized paranasal sinuses are essentially clear. The mastoid air cells are unopacified. No evidence of calvarial fracture. IMPRESSION: No evidence of acute intracranial abnormality. Small vessel ischemic changes. See below for maxillofacial findings. CT MAXILLOFACIAL Findings: Comminuted, depressed bilateral nasal bone fractures. Additional fractures of the anterior/mid nasal septum, which bows to the right. Overlying soft tissue swelling/laceration of the nasal bridge. Small amount of layering fluid/hemorrhage in the left sphenoid sinus (series 4/image 59). The visualized paranasal sinuses are otherwise clear. The mastoid air cells are unopacified. The bilateral orbits,  including the retroconal soft tissues, are within normal limits. Small bilateral cervical lymph nodes. 6 mm intraparotid lymph node on the left (series 3/image 38). IMPRESSION: Comminuted, depressed bilateral nasal bone fractures. Additional fractures of the anterior/mid nasal septum, which bows to the right. Overlying soft tissue swelling/laceration of the nasal bridge. CT CERVICAL SPINE Findings: Straightening of the cervical spine, positive positional. No evidence of fracture or dislocation. Vertebral body heights are maintained. The dens appears intact. No prevertebral soft tissue swelling. Moderate multilevel degenerative changes. Visualized  thyroid is unremarkable. Visualized lung apices are clear. IMPRESSION: No evidence of traumatic injury to the cervical spine. Moderate multilevel degenerative changes. Original Report Authenticated By: Charline Bills, M.D.  Ct Maxillofacial Wo Cm  03/10/2012 *RADIOLOGY REPORT* Clinical Data: Fall, head injury, nose laceration, history of seizures CT HEAD WITHOUT CONTRAST CT MAXILLOFACIAL WITHOUT CONTRAST CT CERVICAL SPINE WITHOUT CONTRAST Technique: Multidetector CT imaging of the head, cervical spine, and maxillofacial structures were performed using the standard protocol without intravenous contrast. Multiplanar CT image reconstructions of the cervical spine and maxillofacial structures were also generated. Comparison: None. CT HEAD Findings: No evidence of parenchymal hemorrhage or extra-axial fluid collection. No mass lesion, mass effect, or midline shift. No CT evidence of acute infarction. Subcortical white matter and periventricular small vessel ischemic changes. Cerebral volume is age appropriate. No ventriculomegaly. The visualized paranasal sinuses are essentially clear. The mastoid air cells are unopacified. No evidence of calvarial fracture. IMPRESSION: No evidence of acute intracranial abnormality. Small vessel ischemic changes. See below for maxillofacial  findings. CT MAXILLOFACIAL Findings: Comminuted, depressed bilateral nasal bone fractures. Additional fractures of the anterior/mid nasal septum, which bows to the right. Overlying soft tissue swelling/laceration of the nasal bridge. Small amount of layering fluid/hemorrhage in the left sphenoid sinus (series 4/image 59). The visualized paranasal sinuses are otherwise clear. The mastoid air cells are unopacified. The bilateral orbits, including the retroconal soft tissues, are within normal limits. Small bilateral cervical lymph nodes. 6 mm intraparotid lymph node on the left (series 3/image 38). IMPRESSION: Comminuted, depressed bilateral nasal bone fractures. Additional fractures of the anterior/mid nasal septum, which bows to the right. Overlying soft tissue swelling/laceration of the nasal bridge. CT CERVICAL SPINE Findings: Straightening of the cervical spine, positive positional. No evidence of fracture or dislocation. Vertebral body heights are maintained. The dens appears intact. No prevertebral soft tissue swelling. Moderate multilevel degenerative changes. Visualized thyroid is unremarkable. Visualized lung apices are clear. IMPRESSION: No evidence of traumatic injury to the cervical spine. Moderate multilevel degenerative changes. Original Report Authenticated By: Charline Bills, M.D.   Date: 03/10/2012  Rate: 63  Rhythm: normal sinus rhythm  QRS Axis: normal  Intervals: normal  ST/T Wave abnormalities: normal  Conduction Disutrbances: none  Narrative Interpretation: unremarkable  No diagnosis found.  MDM   Patient with an episode of near-syncope today that occurred after he felt dizzy. Patient has had diarrhea for the last 24 hours and was coming back from the bathroom when he started to feel lightheaded and dizzy. He abruptly fell hitting his face on the bed. His wife was present and states that he was able to speak with her at all times however when EMS arrived patient was mildly confused  and had urinated on himself. Wife states that in the past he has had 2 seizures with hypokalemia. Patient denies any chest pain or shortness of breath but is complaining of bilateral and pain and states his feet feel numb. When EMS arrived the patient was mildly hypotensive which has resolved with IV fluids.  EKG is within normal limits. Bilateral extremities have normal capillary refill and pulses without any evidence of injury and unclear why they are painful. Concern for possible c-spine injury.  Concern for possible electrolyte abnormality versus dehydration and hypotension causing near syncope. Did not appear to be a cardiac cause as patient had no chest pain, shortness of breath has a normal EKG.  CBC, CMP, troponin, CT of the head and neck pending. Patient given IV fluids, Zofran and morphine. If CT  neg then will get MRI.  3:50 PM  All labs wnl. Plain films showed comminuted nasal bone fracture but neg head and neck. However based on the symptoms will get MRI of the spine for further evaluation.   Cervical MRI shows canal stenosis at C4/5 with cord compression and ligamentous injury with posterior soft tissue edema C3-7 levels.  He has spondylosis at multiple other levels in the cervical spine, but without cord compression.  There is high signal in the disc at C6/7, of unclear significance.  This is worrisome for ligamentous injury at this level.  There does not appear to be a fracture of the cervical spine at any level on the CT scan.  Based on his examination, the patient has a central cervical cord injury with significant bilateral upper extremity weakness.  He will be immobilized in a cervical collar and will require surgery at the C4/5 level with decompression of his cervical spinal cord.  I will admit him to the NICU.  Dr. Kelly Splinter will assess him in the morning with regard to his nasal fracture.  He has had a previous MI and Dr. Elease Hashimoto is his Cardiologist.  I will ask him to assess the safety of  progressing with cervical spinal surgery from a cardiac standpoint.

## 2012-03-10 NOTE — ED Provider Notes (Signed)
Patient to be moved to CDU for holding, MRI brain.  Sign out received from Dr Anitra Lauth.  Pt with diarrhea x 24 hrs, dizziness, fall, near syncope.  Complaining of bilateral hand pain, bilateral foot numbness.  Plan is for MRI c-spine, suture laceration, pain control.    6:39 PM Pt currently in MRI.    7:05 PM Pain now 9/10 in bilateral hands.  Requests pain medication.  States numbness in his feet is improving.    7:58 PM Pain well controlled with dilaudid.  Received call from Dr Constance Goltz stating pt does have some injury around C4-5, will publish in report.    8:30 PM I discussed MRI results with Dr Weldon Inches who has now seen and examined the patient.  I have spoken with Dr Venetia Maxon who will admit the patient.  Dr Weldon Inches has discussed the results with both patient and has wife.  9:13 PM Dr Venetia Maxon has seen and will admit the patient.  He has asked me to call maxillofacial on call, which I have done.  I have spoken with Dr Kelly Splinter about the patient and his results and she states she will see the patient in the morning.  Dr Venetia Maxon agrees to this plan.    10:38 PM I have spoken with [facility] at Dr Fredrich Birks request.  He would like the patient seen tomorrow and cleared for surgery, which I have asked the on call [facility] cardiology fellow to do.  He states that he will have a hard time passing that message on in the morning but he will try and asks that we also try to recontact [place] [facility] in the morning to ask them to come consult.  I have spoken with the nurse who is currently taking care of the patient ([id number], Tresa Endo) and she will pass this on to her relief in the morning.     LACERATION REPAIR Performed by: Trixie Dredge B Authorized by: Trixie Dredge B Consent: Verbal consent obtained. Risks and benefits: risks, benefits and alternatives were discussed Consent given by: patient Patient identity confirmed: provided demographic data Prepped and Draped in normal sterile fashion Wound  explored  Laceration Location: nose  Laceration Length: 1.5cm, irregular  No Foreign Bodies seen or palpated  Anesthesia: local infiltration  Local anesthetic: lidocaine 2% with epinephrine  Anesthetic total: 3 ml  Irrigation method: syringe Amount of cleaning: standard  Skin closure: ethilon 5-0 and 6-0  Number of sutures: 6  Technique: simple interrupted  Patient tolerance: Patient tolerated the procedure well with no immediate complications.   Results for orders placed during the hospital encounter of 03/10/12  CBC WITH DIFFERENTIAL      Component Value Range   WBC 6.1  4.0 - 10.5 K/uL   RBC 4.24  4.22 - 5.81 MIL/uL   Hemoglobin 13.5  13.0 - 17.0 g/dL   HCT 16.1 (*) 09.6 - 04.5 %   MCV 90.8  78.0 - 100.0 fL   MCH 31.8  26.0 - 34.0 pg   MCHC 35.1  30.0 - 36.0 g/dL   RDW 40.9  81.1 - 91.4 %   Platelets 185  150 - 400 K/uL   Neutrophils Relative 70  43 - 77 %   Neutro Abs 4.3  1.7 - 7.7 K/uL   Lymphocytes Relative 21  12 - 46 %   Lymphs Abs 1.3  0.7 - 4.0 K/uL   Monocytes Relative 7  3 - 12 %   Monocytes Absolute 0.5  0.1 - 1.0 K/uL  Eosinophils Relative 1  0 - 5 %   Eosinophils Absolute 0.1  0.0 - 0.7 K/uL   Basophils Relative 0  0 - 1 %   Basophils Absolute 0.0  0.0 - 0.1 K/uL  COMPREHENSIVE METABOLIC PANEL      Component Value Range   Sodium 138  135 - 145 mEq/L   Potassium 3.9  3.5 - 5.1 mEq/L   Chloride 106  96 - 112 mEq/L   CO2 23  19 - 32 mEq/L   Glucose, Bld 117 (*) 70 - 99 mg/dL   BUN 16  6 - 23 mg/dL   Creatinine, Ser 1.61  0.50 - 1.35 mg/dL   Calcium 9.3  8.4 - 09.6 mg/dL   Total Protein 6.7  6.0 - 8.3 g/dL   Albumin 3.8  3.5 - 5.2 g/dL   AST 26  0 - 37 U/L   ALT 16  0 - 53 U/L   Alkaline Phosphatase 39  39 - 117 U/L   Total Bilirubin 0.5  0.3 - 1.2 mg/dL   GFR calc non Af Amer 55 (*) >90 mL/min   GFR calc Af Amer 63 (*) >90 mL/min  TROPONIN I      Component Value Range   Troponin I <0.30  <0.30 ng/mL   Ct Head Wo Contrast  03/10/2012   *RADIOLOGY REPORT*  Clinical Data:  Fall, head injury, nose laceration, history of seizures  CT HEAD WITHOUT CONTRAST CT MAXILLOFACIAL WITHOUT CONTRAST CT CERVICAL SPINE WITHOUT CONTRAST  Technique:  Multidetector CT imaging of the head, cervical spine, and maxillofacial structures were performed using the standard protocol without intravenous contrast. Multiplanar CT image reconstructions of the cervical spine and maxillofacial structures were also generated.  Comparison:  None.  CT HEAD  Findings: No evidence of parenchymal hemorrhage or extra-axial fluid collection. No mass lesion, mass effect, or midline shift.  No CT evidence of acute infarction.  Subcortical white matter and periventricular small vessel ischemic changes.  Cerebral volume is age appropriate.  No ventriculomegaly.  The visualized paranasal sinuses are essentially clear. The mastoid air cells are unopacified.  No evidence of calvarial fracture.  IMPRESSION: No evidence of acute intracranial abnormality.  Small vessel ischemic changes.  See below for maxillofacial findings.  CT MAXILLOFACIAL  Findings:  Comminuted, depressed bilateral nasal bone fractures. Additional fractures of the anterior/mid nasal septum, which bows to the right.  Overlying soft tissue swelling/laceration of the nasal bridge.  Small amount of layering fluid/hemorrhage in the left sphenoid sinus (series 4/image 59).  The visualized paranasal sinuses are otherwise clear.  The mastoid air cells are unopacified.  The bilateral orbits, including the retroconal soft tissues, are within normal limits.  Small bilateral cervical lymph nodes.  6 mm intraparotid lymph node on the left (series 3/image 38).  IMPRESSION: Comminuted, depressed bilateral nasal bone fractures.  Additional fractures of the anterior/mid nasal septum, which bows to the right.  Overlying soft tissue swelling/laceration of the nasal bridge.  CT CERVICAL SPINE  Findings:   Straightening of the cervical spine,  positive positional.  No evidence of fracture or dislocation.  Vertebral body heights are maintained.  The dens appears intact.  No prevertebral soft tissue swelling.  Moderate multilevel degenerative changes.  Visualized thyroid is unremarkable.  Visualized lung apices are clear.  IMPRESSION: No evidence of traumatic injury to the cervical spine.  Moderate multilevel degenerative changes.  Original Report Authenticated By: Charline Bills, M.D.   Ct Cervical Spine Wo Contrast  03/10/2012  *  RADIOLOGY REPORT*  Clinical Data:  Fall, head injury, nose laceration, history of seizures  CT HEAD WITHOUT CONTRAST CT MAXILLOFACIAL WITHOUT CONTRAST CT CERVICAL SPINE WITHOUT CONTRAST  Technique:  Multidetector CT imaging of the head, cervical spine, and maxillofacial structures were performed using the standard protocol without intravenous contrast. Multiplanar CT image reconstructions of the cervical spine and maxillofacial structures were also generated.  Comparison:  None.  CT HEAD  Findings: No evidence of parenchymal hemorrhage or extra-axial fluid collection. No mass lesion, mass effect, or midline shift.  No CT evidence of acute infarction.  Subcortical white matter and periventricular small vessel ischemic changes.  Cerebral volume is age appropriate.  No ventriculomegaly.  The visualized paranasal sinuses are essentially clear. The mastoid air cells are unopacified.  No evidence of calvarial fracture.  IMPRESSION: No evidence of acute intracranial abnormality.  Small vessel ischemic changes.  See below for maxillofacial findings.  CT MAXILLOFACIAL  Findings:  Comminuted, depressed bilateral nasal bone fractures. Additional fractures of the anterior/mid nasal septum, which bows to the right.  Overlying soft tissue swelling/laceration of the nasal bridge.  Small amount of layering fluid/hemorrhage in the left sphenoid sinus (series 4/image 59).  The visualized paranasal sinuses are otherwise clear.  The mastoid air  cells are unopacified.  The bilateral orbits, including the retroconal soft tissues, are within normal limits.  Small bilateral cervical lymph nodes.  6 mm intraparotid lymph node on the left (series 3/image 38).  IMPRESSION: Comminuted, depressed bilateral nasal bone fractures.  Additional fractures of the anterior/mid nasal septum, which bows to the right.  Overlying soft tissue swelling/laceration of the nasal bridge.  CT CERVICAL SPINE  Findings:   Straightening of the cervical spine, positive positional.  No evidence of fracture or dislocation.  Vertebral body heights are maintained.  The dens appears intact.  No prevertebral soft tissue swelling.  Moderate multilevel degenerative changes.  Visualized thyroid is unremarkable.  Visualized lung apices are clear.  IMPRESSION: No evidence of traumatic injury to the cervical spine.  Moderate multilevel degenerative changes.  Original Report Authenticated By: Charline Bills, M.D.   Mr Cervical Spine Wo Contrast  03/10/2012  *RADIOLOGY REPORT*  Clinical Data: Fall.  Hyperextension injury.  Hand and feet pain.  MRI CERVICAL SPINE WITHOUT CONTRAST  Technique:  Multiplanar and multiecho pulse sequences of the cervical spine, to include the craniocervical junction and cervicothoracic junction, were obtained according to standard protocol without intravenous contrast.  Comparison: CT cervical spine 03/10/2012.  Findings: Intraspinous edema C3-C7 suggesting soft tissue injury. Additionally, there is slight increased signal within the anterior aspect of the C6-7 disc space and injury at this level is not excluded.  Multilevel cervical spondylotic changes most prominent C4-5 level as detailed below.  Within the compressed C4-5 cord, there is increased signal suggestive of edema and / or gliosis.  This does not appear hemorrhagic on gradient sequence.  This may contribute to the patient's findings.  Both vertebral arteries are patent.  C2-3:  No significant spinal  stenosis or foraminal narrowing.  C3-4:  Broad-based disc osteophyte.  Spinal stenosis with mild cord contact/flattening.  Uncinate bony overgrowth with moderate foraminal narrowing.  C4-5:  Prominent broad-based disc osteophyte complex.  Cord compression.  Increased signal within the compressed cord as discussed above.  Uncinate hypertrophy.  Marked bilateral foraminal narrowing greater on the right.  C5-6:  Broad-based disc osteophyte complex slightly greater to the right.  Minimal cord contact.  Minimal foraminal narrowing.  C6-7:  Broad-based disc osteophyte slightly  more notable to the left.  No cord compression.  Uncinate hypertrophy greater on the left.  Marked left foraminal narrowing.  Minimal right foraminal narrowing.  C7-T1:  Disc degeneration.  Broad-based disc osteophyte complex. Mild spinal stenosis.  Uncinate hypertrophy greater on the left. Moderate to marked left-sided  and minimal right-sided foraminal narrowing.  IMPRESSION: Intraspinous edema C3-C7 suggesting soft tissue injury. Additionally, there is slight increased signal within the anterior aspect of the C6-7 disc space and injury at this level is not excluded.  Multilevel cervical spondylotic changes most prominent C4-5 level as detailed below.  Within the compressed C4-5 cord, there is increased signal suggestive of edema and / or gliosis.  This does not appear hemorrhagic on gradient sequence.  This may contribute to the patient's findings.  C3-4:  Broad-based disc osteophyte.  Spinal stenosis with mild cord contact/flattening.  Uncinate bony overgrowth with moderate foraminal narrowing.  C4-5:  Prominent broad-based disc osteophyte complex.  Cord compression.  Increased signal within the compressed cord as discussed above.  Uncinate hypertrophy.  Marked bilateral foraminal narrowing greater on the right.  C5-6:  Broad-based disc osteophyte complex slightly greater to the right.  Minimal cord contact.  Minimal foraminal narrowing.  C6-7:   Broad-based disc osteophyte slightly more notable to the left.  No cord compression.  Uncinate hypertrophy greater on the left.  Marked left foraminal narrowing.  Minimal right foraminal narrowing.  C7-T1:  Disc degeneration.  Broad-based disc osteophyte complex. Mild spinal stenosis.  Uncinate hypertrophy greater on the left. Moderate to marked left-sided  and minimal right-sided foraminal narrowing.  Critical Value/emergent results were called by telephone at the time of interpretation on 03/10/2012 at 7:58 p.m. to [facility] physician's assistant., who verbally acknowledged these results.  Original Report Authenticated By: Fuller Canada, M.D.   Ct Maxillofacial Wo Cm  03/10/2012  *RADIOLOGY REPORT*  Clinical Data:  Fall, head injury, nose laceration, history of seizures  CT HEAD WITHOUT CONTRAST CT MAXILLOFACIAL WITHOUT CONTRAST CT CERVICAL SPINE WITHOUT CONTRAST  Technique:  Multidetector CT imaging of the head, cervical spine, and maxillofacial structures were performed using the standard protocol without intravenous contrast. Multiplanar CT image reconstructions of the cervical spine and maxillofacial structures were also generated.  Comparison:  None.  CT HEAD  Findings: No evidence of parenchymal hemorrhage or extra-axial fluid collection. No mass lesion, mass effect, or midline shift.  No CT evidence of acute infarction.  Subcortical white matter and periventricular small vessel ischemic changes.  Cerebral volume is age appropriate.  No ventriculomegaly.  The visualized paranasal sinuses are essentially clear. The mastoid air cells are unopacified.  No evidence of calvarial fracture.  IMPRESSION: No evidence of acute intracranial abnormality.  Small vessel ischemic changes.  See below for maxillofacial findings.  CT MAXILLOFACIAL  Findings:  Comminuted, depressed bilateral nasal bone fractures. Additional fractures of the anterior/mid nasal septum, which bows to the right.  Overlying soft tissue  swelling/laceration of the nasal bridge.  Small amount of layering fluid/hemorrhage in the left sphenoid sinus (series 4/image 59).  The visualized paranasal sinuses are otherwise clear.  The mastoid air cells are unopacified.  The bilateral orbits, including the retroconal soft tissues, are within normal limits.  Small bilateral cervical lymph nodes.  6 mm intraparotid lymph node on the left (series 3/image 38).  IMPRESSION: Comminuted, depressed bilateral nasal bone fractures.  Additional fractures of the anterior/mid nasal septum, which bows to the right.  Overlying soft tissue swelling/laceration of the nasal bridge.  CT CERVICAL  SPINE  Findings:   Straightening of the cervical spine, positive positional.  No evidence of fracture or dislocation.  Vertebral body heights are maintained.  The dens appears intact.  No prevertebral soft tissue swelling.  Moderate multilevel degenerative changes.  Visualized thyroid is unremarkable.  Visualized lung apices are clear.  IMPRESSION: No evidence of traumatic injury to the cervical spine.  Moderate multilevel degenerative changes.  Original Report Authenticated By: Charline Bills, M.D.      [place], [place] 03/10/12 [id number]

## 2012-03-10 NOTE — ED Notes (Signed)
I gave the patient a half a cup of ice chips.

## 2012-03-11 ENCOUNTER — Encounter (HOSPITAL_COMMUNITY): Payer: Self-pay | Admitting: Plastic Surgery

## 2012-03-11 DIAGNOSIS — I2510 Atherosclerotic heart disease of native coronary artery without angina pectoris: Secondary | ICD-10-CM | POA: Diagnosis present

## 2012-03-11 DIAGNOSIS — M4712 Other spondylosis with myelopathy, cervical region: Secondary | ICD-10-CM

## 2012-03-11 DIAGNOSIS — S022XXA Fracture of nasal bones, initial encounter for closed fracture: Secondary | ICD-10-CM | POA: Diagnosis present

## 2012-03-11 NOTE — Consult Note (Signed)
CARDIOLOGY CONSULT NOTE  Patient ID: Edward [name] MRN: [id number], DOB/AGE: May 16, 1951   Admit date: 03/10/2012 Date of Consult: 03/11/2012   Primary Physician: Hollice Espy, MD Primary Cardiologist: Katherina Right, MD  Pt. Profile  61 y/o male with h/o CAD s/p MI and stenting in 1999 whom we've been asked to eval secondary to fall with traumatic cervical spine injury pending surgical decompression.  Problem List  Past Medical History  Diagnosis Date  . Measles   . Mumps   . Chicken pox   . Whooping cough   . Pneumonia   . Hypertension   . Coronary artery disease     a. 1999 s/p MI with cath/PCI;  b. 05/1999 Ex Cardiolite EF 68%, no ishcemia.  . Dyslipidemia   . Hypokalemia   . History of tobacco abuse   . Near syncope   . Nasal bones, closed fracture     a. 02/2012 in setting of presyncope/fall  . Injury of cervical spine     a. 02/2012 C4/5    Past Surgical History  Procedure Date  . [place] tendon surgery 1989  . Achilles tendon surgery 1989  . Cardiac catheterization 06/28/1998    single vessel CAD involving the distal RCA/PTCA and stenting of the distal RCA//EF- 50-55%  . Nm myoview ltd 05/17/2009    normal stress nuclear study/no evidence of ischemia/EF- 68%     Allergies  No Known Allergies  HPI   61 y/o male with the above problem list.  He is s/p MI and stenting in 1999.  His last stress test was in 2000 and was non-ischemic.  He is very active @ home.  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  Further, he uses an elliptical 2-3x/wk (last used - last Monday).  He has not had any chest pain or doe to speak of.  Beginning on Saturday, pt noted intermittent diarrhea.  This became more frequent overnight and into Sunday morning.  On Sunday, while sitting on the commode, following a loose BM, pt began to feel dizzy/lightheaded.  He denies c/p, sob, diaphoresis.  He got up and began walking back to his bed.  His  dizziness worsened and he says his "legs gave-out."  He remembers falling and does not believe that he lost consciousness.  He tried putting his arms out but says he couldn't do it quickly enough.  As a result, he fell face-first and struck the wooden rail on his bed with resultant facial, nasal, and neck pain with upper extremity weakness.  EMS was called and a C-collar was applied.  He was taken to the [facility] ED where CT's/MRI were performed and revealed  canal stenosis at C4/5 with cord compression and ligamentous injury with posterior soft tissue edema C3-7 levels.  It is felt that he has a central cervical cord injury and will require c4/5 decompression along with reduction of a nasal fracture.  We have been asked to eval for cardiac preoperative risk assessment.  Inpatient Medications    . amLODipine  5 mg Oral Daily  . aspirin EC  81 mg Oral Daily  . atorvastatin  40 mg Oral q1800  . dexamethasone  4 mg Intravenous Q6H   Or  . dexamethasone  4 mg Oral Q6H  . lisinopril  20 mg Oral Daily   And  . hydrochlorothiazide  25 mg Oral Daily  .  HYDROmorphone (DILAUDID) injection  1 mg Intravenous Once  .  HYDROmorphone (DILAUDID) injection  1 mg Intravenous Once  . metoprolol succinate  50 mg Oral Daily  .  morphine injection  4 mg Intravenous Once  . multivitamin with minerals  1 tablet Oral Daily  . ondansetron  4 mg Intravenous Once  . pantoprazole (PROTONIX) IV  40 mg Intravenous QHS  . potassium chloride  10 mEq Oral Daily  . sodium chloride  1,000 mL Intravenous Once  . sodium chloride  3 mL Intravenous Q12H  . TDaP  0.5 mL Intramuscular Once  . DISCONTD: lisinopril-hydrochlorothiazide  1 tablet Oral Daily    Family History Family History  Problem Relation Age of Onset  . Heart attack Father     71  . Cancer Mother     74    Social History History   Social History  . Marital Status: Married    Spouse Name: N/A    Number of Children: N/A  . Years of Education: N/A    Occupational History  . Paramedic    Social History Main Topics  . Smoking status: Former Smoker -- 1.0 packs/day for 20 years    Types: Cigarettes    Quit date: 11/02/1986  . Smokeless tobacco: Not on file  . Alcohol Use: Yes     occasional alcoholic beverage.  . Drug Use: No  . Sexually Active: Yes   Other Topics Concern  . Not on file   Social History Narrative   Married and lives with wife in [place].  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  Exercises 2-3 days/wk without limitations.     Review of Systems  General:  No chills, fever, night sweats or weight changes.  + dizziness as outlined above. Cardiovascular:  No chest pain, dyspnea on exertion, edema, orthopnea, palpitations, paroxysmal nocturnal dyspnea. Dermatological: No rash, lesions/masses Respiratory: No cough, dyspnea Urologic: No hematuria, dysuria Abdominal:   +++ diarrhea as outlined above.  No nausea, vomiting, bright red blood per rectum, melena, or hematemesis Neurologic:  +++ bilat UE wkns.  Dizziness as outlined above - now resolved.  No visual changes, changes in mental status. All other systems reviewed and are otherwise negative except as noted above.  Physical Exam  Blood pressure 145/78, pulse 89, temperature 98.7 F (37.1 C), temperature source Oral, resp. rate 14, height 6' (1.829 m), weight 208 lb (94.348 kg), SpO2 99.00%.  General: Pleasant, NAD Psych: Normal affect. Neuro: Alert and oriented X 3. bilat UE wkns. HEENT: Normal  Neck: Supple - unable to fully evaluate 2/2 C collar in place. Lungs:  Resp regular and unlabored, CTA. Heart: RRR no s3, s4, or murmurs. Abdomen: Soft, non-tender, non-distended, BS + x 4.  Extremities: No clubbing, cyanosis or edema. DP/PT/Radials 2+ and equal bilaterally.  Labs   Basename 03/10/12 1430  CKTOTAL --  CKMB --  TROPONINI <0.30   Lab Results  Component Value Date   WBC 6.1 03/10/2012   HGB 13.5 03/10/2012   HCT 38.5* 03/10/2012   MCV 90.8  03/10/2012   PLT 185 03/10/2012     Lab 03/10/12 1430  NA 138  K 3.9  CL 106  CO2 23  BUN 16  CREATININE 1.35  CALCIUM 9.3  PROT 6.7  BILITOT 0.5  ALKPHOS 39  ALT 16  AST 26  GLUCOSE 117*   Radiology/Studies  Mr Cervical Spine Wo Contrast  03/10/2012  *RADIOLOGY REPORT*  Clinical Data: Fall.  Hyperextension injury.  Hand and feet pain.  MRI CERVICAL SPINE WITHOUT CONTRAST  IMPRESSION: Intraspinous edema C3-C7 suggesting soft tissue injury. Additionally, there is slight increased signal within the anterior aspect of the C6-7 disc space and injury at this level is not excluded.  Multilevel cervical spondylotic changes most prominent C4-5 level as detailed below.  Within the compressed C4-5 cord, there is increased signal suggestive of edema and / or gliosis.  This does not appear hemorrhagic on gradient sequence.  This may contribute to the patient's findings.  C3-4:  Broad-based disc osteophyte.  Spinal stenosis with mild cord contact/flattening.  Uncinate bony overgrowth with moderate foraminal narrowing.  C4-5:  Prominent broad-based disc osteophyte complex.  Cord compression.  Increased signal within the compressed cord as discussed above.  Uncinate hypertrophy.  Marked bilateral foraminal narrowing greater on the right.  C5-6:  Broad-based disc osteophyte complex slightly greater to the right.  Minimal cord contact.  Minimal foraminal narrowing.  C6-7:  Broad-based disc osteophyte slightly more notable to the left.  No cord compression.  Uncinate hypertrophy greater on the left.  Marked left foraminal narrowing.  Minimal right foraminal narrowing.  C7-T1:  Disc degeneration.  Broad-based disc osteophyte complex. Mild spinal stenosis.  Uncinate hypertrophy greater on the left. Moderate to marked left-sided  and minimal right-sided foraminal narrowing.  Critical Value/emergent results were called by telephone at the time of interpretation on 03/10/2012 at 7:58 p.m. to [facility] physician's  assistant., who verbally acknowledged these results.  Original Report Authenticated By: Fuller Canada, M.D.   Ct Maxillofacial Wo Cm  03/10/2012  *RADIOLOGY REPORT*  Clinical Data:  Fall, head injury, nose laceration, history of seizures  CT HEAD WITHOUT CONTRAST CT MAXILLOFACIAL WITHOUT CONTRAST CT CERVICAL SPINE WITHOUT CONTRAST   None.  CT HEAD  Findings:   IMPRESSION: No evidence of acute intracranial abnormality.  Small vessel ischemic changes.  See below for maxillofacial findings.  CT MAXILLOFACIAL  Findings:  IMPRESSION: Comminuted, depressed bilateral nasal bone fractures.  Additional fractures of the anterior/mid nasal septum, which bows to the right.  Overlying soft tissue swelling/laceration of the nasal bridge.  CT CERVICAL SPINE  Findings:     IMPRESSION: No evidence of traumatic injury to the cervical spine.  Moderate multilevel degenerative changes.  Original Report Authenticated By: Charline Bills, M.D.    ECG  RSR, 63, pac, twi III  ASSESSMENT AND PLAN  1.  Cervical Spine injury pending decompression:  Per NSU.  From cardiac standpoint, pt has been doing exceptionally well and will not require further cardiac testing prior to surgery.  Cont bb/statin throughout peri-operative period.  Resume ASA post-op when felt to be feasible.  2.  Nasal Fracture:  Pending reduction tomorrow.  As above, cont bb/statin.  3.  CAD:  He exercises regularly w/o c/p or limitations.  No further cardiac work-up prior to surgery.  Cont bb/statin throughout peri-operative period.  Resume ASA post-op when felt to be feasible.  4.  HTN:  bp's trending up.  Follow today.  If this trend persists, would push Toprol XL to 100mg  daily (HR's in 80's).  5.  HL:  Cont statin.  Signed, Nicolasa Ducking, NP 03/11/2012, 11:31 AM Patient seen and examined. I agree with the assessment and plan as detailed above. See also my additional thoughts below.   Saw the patient and discussed the entire case with  Edward [name] before he completed the note above. The patient's cardiac status is stable. He has been exercising regularly and has not had symptoms. His fall was not a cardiac event. He was clearly dehydrated  and had a fall from this. He is stable for this surgical procedures that are proposed for his nose and C-spine. No further cardiac workup is needed. He is cleared for his surgery. Willa Rough, MD, [facility] 03/11/2012 12:32 PM

## 2012-03-11 NOTE — Consult Note (Signed)
Reason for Consult:Facial trauma Referring Physician: Neurosurgery  Edward [name] is an 61 y.o. male.  HPI: The patient is a 61 yrs old bm presented to the ED after he fell from 1-2 feet.  His face hit the wooden bed frame when he became dizzy.  LOC is unclear.  There was a small amount of blood lost. He complained of head and neck pain, nausea and tingling in his hands.  He was not ambulatory at the scene. He denied any fever or abdominal pain but states he had diarrhea for the past day. or recent illness.  The symptoms are worse with standing. Treatment on scene included a c-collar and a backboard.     Past Medical History  Diagnosis Date  . Measles   . Mumps   . Chicken pox   . Whooping cough   . Pneumonia   . Hypertension   . Coronary artery disease   . Myocardial infarction   . Dyslipidemia   . Hypokalemia     Past Surgical History  Procedure Date  . [place] tendon surgery 1989  . Achilles tendon surgery 1989  . Cardiac catheterization 06/28/1998    single vessel CAD involving the distal RCA/PTCA and stenting of the distal RCA//EF- 50-55%  . Nm myoview ltd 05/17/2009    normal stress nuclear study/no evidence of ischemia/EF- 68%    Family History  Problem Relation Age of Onset  . Heart attack Father   . Cancer Mother     Social History:  reports that he quit smoking about 25 years ago. His smoking use included Cigarettes. He does not have any smokeless tobacco history on file. He reports that he drinks alcohol. His drug history not on file.  Allergies: No Known Allergies  Medications: I have reviewed the patient's current medications.  Results for orders placed during the hospital encounter of 03/10/12 (from the past 48 hour(s))  CBC WITH DIFFERENTIAL     Status: Abnormal   Collection Time   03/10/12  2:30 PM      Component Value Range Comment   WBC 6.1  4.0 - 10.5 K/uL    RBC 4.24  4.22 - 5.81 MIL/uL    Hemoglobin 13.5  13.0 - 17.0 g/dL    HCT 19.1 (*) 47.8  - 52.0 %    MCV 90.8  78.0 - 100.0 fL    MCH 31.8  26.0 - 34.0 pg    MCHC 35.1  30.0 - 36.0 g/dL    RDW 29.5  62.1 - 30.8 %    Platelets 185  150 - 400 K/uL    Neutrophils Relative 70  43 - 77 %    Neutro Abs 4.3  1.7 - 7.7 K/uL    Lymphocytes Relative 21  12 - 46 %    Lymphs Abs 1.3  0.7 - 4.0 K/uL    Monocytes Relative 7  3 - 12 %    Monocytes Absolute 0.5  0.1 - 1.0 K/uL    Eosinophils Relative 1  0 - 5 %    Eosinophils Absolute 0.1  0.0 - 0.7 K/uL    Basophils Relative 0  0 - 1 %    Basophils Absolute 0.0  0.0 - 0.1 K/uL   COMPREHENSIVE METABOLIC PANEL     Status: Abnormal   Collection Time   03/10/12  2:30 PM      Component Value Range Comment   Sodium 138  135 - 145 mEq/L    Potassium 3.9  3.5 - 5.1 mEq/L    Chloride 106  96 - 112 mEq/L    CO2 23  19 - 32 mEq/L    Glucose, Bld 117 (*) 70 - 99 mg/dL    BUN 16  6 - 23 mg/dL    Creatinine, Ser 1.61  0.50 - 1.35 mg/dL    Calcium 9.3  8.4 - 09.6 mg/dL    Total Protein 6.7  6.0 - 8.3 g/dL    Albumin 3.8  3.5 - 5.2 g/dL    AST 26  0 - 37 U/L    ALT 16  0 - 53 U/L    Alkaline Phosphatase 39  39 - 117 U/L    Total Bilirubin 0.5  0.3 - 1.2 mg/dL    GFR calc non Af Amer 55 (*) >90 mL/min    GFR calc Af Amer 63 (*) >90 mL/min   TROPONIN I     Status: Normal   Collection Time   03/10/12  2:30 PM      Component Value Range Comment   Troponin I <0.30  <0.30 ng/mL   POCT I-STAT TROPONIN I     Status: Normal   Collection Time   03/10/12 10:03 PM      Component Value Range Comment   Troponin i, poc 0.01  0.00 - 0.08 ng/mL    Comment 3              Ct Head Wo Contrast  03/10/2012  *RADIOLOGY REPORT*  Clinical Data:  Fall, head injury, nose laceration, history of seizures  CT HEAD WITHOUT CONTRAST CT MAXILLOFACIAL WITHOUT CONTRAST CT CERVICAL SPINE WITHOUT CONTRAST  Technique:  Multidetector CT imaging of the head, cervical spine, and maxillofacial structures were performed using the standard protocol without intravenous contrast.  Multiplanar CT image reconstructions of the cervical spine and maxillofacial structures were also generated.  Comparison:  None.  CT HEAD  Findings: No evidence of parenchymal hemorrhage or extra-axial fluid collection. No mass lesion, mass effect, or midline shift.  No CT evidence of acute infarction.  Subcortical white matter and periventricular small vessel ischemic changes.  Cerebral volume is age appropriate.  No ventriculomegaly.  The visualized paranasal sinuses are essentially clear. The mastoid air cells are unopacified.  No evidence of calvarial fracture.  IMPRESSION: No evidence of acute intracranial abnormality.  Small vessel ischemic changes.  See below for maxillofacial findings.  CT MAXILLOFACIAL  Findings:  Comminuted, depressed bilateral nasal bone fractures. Additional fractures of the anterior/mid nasal septum, which bows to the right.  Overlying soft tissue swelling/laceration of the nasal bridge.  Small amount of layering fluid/hemorrhage in the left sphenoid sinus (series 4/image 59).  The visualized paranasal sinuses are otherwise clear.  The mastoid air cells are unopacified.  The bilateral orbits, including the retroconal soft tissues, are within normal limits.  Small bilateral cervical lymph nodes.  6 mm intraparotid lymph node on the left (series 3/image 38).  IMPRESSION: Comminuted, depressed bilateral nasal bone fractures.  Additional fractures of the anterior/mid nasal septum, which bows to the right.  Overlying soft tissue swelling/laceration of the nasal bridge.  CT CERVICAL SPINE  Findings:   Straightening of the cervical spine, positive positional.  No evidence of fracture or dislocation.  Vertebral body heights are maintained.  The dens appears intact.  No prevertebral soft tissue swelling.  Moderate multilevel degenerative changes.  Visualized thyroid is unremarkable.  Visualized lung apices are clear.  IMPRESSION: No evidence of traumatic injury to the cervical  spine.  Moderate  multilevel degenerative changes.  Original Report Authenticated By: Charline Bills, M.D.   Ct Cervical Spine Wo Contrast  03/10/2012  *RADIOLOGY REPORT*  Clinical Data:  Fall, head injury, nose laceration, history of seizures  CT HEAD WITHOUT CONTRAST CT MAXILLOFACIAL WITHOUT CONTRAST CT CERVICAL SPINE WITHOUT CONTRAST  Technique:  Multidetector CT imaging of the head, cervical spine, and maxillofacial structures were performed using the standard protocol without intravenous contrast. Multiplanar CT image reconstructions of the cervical spine and maxillofacial structures were also generated.  Comparison:  None.  CT HEAD  Findings: No evidence of parenchymal hemorrhage or extra-axial fluid collection. No mass lesion, mass effect, or midline shift.  No CT evidence of acute infarction.  Subcortical white matter and periventricular small vessel ischemic changes.  Cerebral volume is age appropriate.  No ventriculomegaly.  The visualized paranasal sinuses are essentially clear. The mastoid air cells are unopacified.  No evidence of calvarial fracture.  IMPRESSION: No evidence of acute intracranial abnormality.  Small vessel ischemic changes.  See below for maxillofacial findings.  CT MAXILLOFACIAL  Findings:  Comminuted, depressed bilateral nasal bone fractures. Additional fractures of the anterior/mid nasal septum, which bows to the right.  Overlying soft tissue swelling/laceration of the nasal bridge.  Small amount of layering fluid/hemorrhage in the left sphenoid sinus (series 4/image 59).  The visualized paranasal sinuses are otherwise clear.  The mastoid air cells are unopacified.  The bilateral orbits, including the retroconal soft tissues, are within normal limits.  Small bilateral cervical lymph nodes.  6 mm intraparotid lymph node on the left (series 3/image 38).  IMPRESSION: Comminuted, depressed bilateral nasal bone fractures.  Additional fractures of the anterior/mid nasal septum, which bows to the right.   Overlying soft tissue swelling/laceration of the nasal bridge.  CT CERVICAL SPINE  Findings:   Straightening of the cervical spine, positive positional.  No evidence of fracture or dislocation.  Vertebral body heights are maintained.  The dens appears intact.  No prevertebral soft tissue swelling.  Moderate multilevel degenerative changes.  Visualized thyroid is unremarkable.  Visualized lung apices are clear.  IMPRESSION: No evidence of traumatic injury to the cervical spine.  Moderate multilevel degenerative changes.  Original Report Authenticated By: Charline Bills, M.D.   Mr Cervical Spine Wo Contrast  03/10/2012  *RADIOLOGY REPORT*  Clinical Data: Fall.  Hyperextension injury.  Hand and feet pain.  MRI CERVICAL SPINE WITHOUT CONTRAST  Technique:  Multiplanar and multiecho pulse sequences of the cervical spine, to include the craniocervical junction and cervicothoracic junction, were obtained according to standard protocol without intravenous contrast.  Comparison: CT cervical spine 03/10/2012.  Findings: Intraspinous edema C3-C7 suggesting soft tissue injury. Additionally, there is slight increased signal within the anterior aspect of the C6-7 disc space and injury at this level is not excluded.  Multilevel cervical spondylotic changes most prominent C4-5 level as detailed below.  Within the compressed C4-5 cord, there is increased signal suggestive of edema and / or gliosis.  This does not appear hemorrhagic on gradient sequence.  This may contribute to the patient's findings.  Both vertebral arteries are patent.  C2-3:  No significant spinal stenosis or foraminal narrowing.  C3-4:  Broad-based disc osteophyte.  Spinal stenosis with mild cord contact/flattening.  Uncinate bony overgrowth with moderate foraminal narrowing.  C4-5:  Prominent broad-based disc osteophyte complex.  Cord compression.  Increased signal within the compressed cord as discussed above.  Uncinate hypertrophy.  Marked bilateral  foraminal narrowing greater on the right.  C5-6:  Broad-based disc osteophyte complex slightly greater to the right.  Minimal cord contact.  Minimal foraminal narrowing.  C6-7:  Broad-based disc osteophyte slightly more notable to the left.  No cord compression.  Uncinate hypertrophy greater on the left.  Marked left foraminal narrowing.  Minimal right foraminal narrowing.  C7-T1:  Disc degeneration.  Broad-based disc osteophyte complex. Mild spinal stenosis.  Uncinate hypertrophy greater on the left. Moderate to marked left-sided  and minimal right-sided foraminal narrowing.  IMPRESSION: Intraspinous edema C3-C7 suggesting soft tissue injury. Additionally, there is slight increased signal within the anterior aspect of the C6-7 disc space and injury at this level is not excluded.  Multilevel cervical spondylotic changes most prominent C4-5 level as detailed below.  Within the compressed C4-5 cord, there is increased signal suggestive of edema and / or gliosis.  This does not appear hemorrhagic on gradient sequence.  This may contribute to the patient's findings.  C3-4:  Broad-based disc osteophyte.  Spinal stenosis with mild cord contact/flattening.  Uncinate bony overgrowth with moderate foraminal narrowing.  C4-5:  Prominent broad-based disc osteophyte complex.  Cord compression.  Increased signal within the compressed cord as discussed above.  Uncinate hypertrophy.  Marked bilateral foraminal narrowing greater on the right.  C5-6:  Broad-based disc osteophyte complex slightly greater to the right.  Minimal cord contact.  Minimal foraminal narrowing.  C6-7:  Broad-based disc osteophyte slightly more notable to the left.  No cord compression.  Uncinate hypertrophy greater on the left.  Marked left foraminal narrowing.  Minimal right foraminal narrowing.  C7-T1:  Disc degeneration.  Broad-based disc osteophyte complex. Mild spinal stenosis.  Uncinate hypertrophy greater on the left. Moderate to marked left-sided   and minimal right-sided foraminal narrowing.  Critical Value/emergent results were called by telephone at the time of interpretation on 03/10/2012 at 7:58 p.m. to [facility] physician's assistant., who verbally acknowledged these results.  Original Report Authenticated By: Fuller Canada, M.D.   Ct Maxillofacial Wo Cm  03/10/2012  *RADIOLOGY REPORT*  Clinical Data:  Fall, head injury, nose laceration, history of seizures  CT HEAD WITHOUT CONTRAST CT MAXILLOFACIAL WITHOUT CONTRAST CT CERVICAL SPINE WITHOUT CONTRAST  Technique:  Multidetector CT imaging of the head, cervical spine, and maxillofacial structures were performed using the standard protocol without intravenous contrast. Multiplanar CT image reconstructions of the cervical spine and maxillofacial structures were also generated.  Comparison:  None.  CT HEAD  Findings: No evidence of parenchymal hemorrhage or extra-axial fluid collection. No mass lesion, mass effect, or midline shift.  No CT evidence of acute infarction.  Subcortical white matter and periventricular small vessel ischemic changes.  Cerebral volume is age appropriate.  No ventriculomegaly.  The visualized paranasal sinuses are essentially clear. The mastoid air cells are unopacified.  No evidence of calvarial fracture.  IMPRESSION: No evidence of acute intracranial abnormality.  Small vessel ischemic changes.  See below for maxillofacial findings.  CT MAXILLOFACIAL  Findings:  Comminuted, depressed bilateral nasal bone fractures. Additional fractures of the anterior/mid nasal septum, which bows to the right.  Overlying soft tissue swelling/laceration of the nasal bridge.  Small amount of layering fluid/hemorrhage in the left sphenoid sinus (series 4/image 59).  The visualized paranasal sinuses are otherwise clear.  The mastoid air cells are unopacified.  The bilateral orbits, including the retroconal soft tissues, are within normal limits.  Small bilateral cervical lymph nodes.  6 mm  intraparotid lymph node on the left (series 3/image 38).  IMPRESSION: Comminuted, depressed bilateral nasal bone fractures.  Additional fractures of the anterior/mid nasal septum, which bows to the right.  Overlying soft tissue swelling/laceration of the nasal bridge.  CT CERVICAL SPINE  Findings:   Straightening of the cervical spine, positive positional.  No evidence of fracture or dislocation.  Vertebral body heights are maintained.  The dens appears intact.  No prevertebral soft tissue swelling.  Moderate multilevel degenerative changes.  Visualized thyroid is unremarkable.  Visualized lung apices are clear.  IMPRESSION: No evidence of traumatic injury to the cervical spine.  Moderate multilevel degenerative changes.  Original Report Authenticated By: Charline Bills, M.D.    Review of Systems  Constitutional: Negative.   HENT: Positive for nosebleeds and neck pain. Negative for hearing loss, ear pain, tinnitus and ear discharge.   Eyes: Negative.   Cardiovascular: Negative.   Gastrointestinal: Negative.   Genitourinary: Negative.   Skin: Negative.   Psychiatric/Behavioral: Negative.    Blood pressure 166/81, pulse 83, temperature 98.7 F (37.1 C), temperature source Oral, resp. rate 19, height 6' (1.829 m), weight 94.348 kg (208 lb), SpO2 99.00%. Physical Exam  Constitutional: He is oriented to person, place, and time. He appears well-developed and well-nourished.  HENT:  Head: Normocephalic.    Right Ear: External ear normal.  Left Ear: External ear normal.  Eyes: Conjunctivae and EOM are normal. Pupils are equal, round, and reactive to light.  Cardiovascular: Normal rate.   Respiratory: Effort normal.  GI: Soft.  Neurological: He is alert and oriented to person, place, and time.  Skin: Skin is warm.  Psychiatric: He has a normal mood and affect.    Assessment/Plan: Bilateral nasal fracture - patient is waiting on cardiac clearance for neurosurgery.  If able, we can reduce the  nasal fracture in coordination with the neuro case.  No nose blowing and keep head of the bed elevated as able.  SANGER,Stephie Xu 03/11/2012, 7:56 AM

## 2012-03-11 NOTE — Progress Notes (Signed)
Therapy postoperatively will need to be initiated so that I can seek authorization with insurance for an inpt rehab admission prior to d/c home. I will meet with patient and wife tomorrow to discuss rehab venue. Please call me with any questions. [phone]

## 2012-03-11 NOTE — Progress Notes (Signed)
Trish Cardmaster notified of patients location and need for cardiology clearance. She stated that someone would see him today.

## 2012-03-11 NOTE — Evaluation (Signed)
Occupational Therapy Evaluation Patient Details Name: Edward [name] MRN: [id number] DOB: [birth year] Today's Date: 03/11/2012 Time: 4098-1191 OT Time Calculation (min): 37 min  OT Assessment / Plan / Recommendation Clinical Impression  61 yo male s/p fall Cervical MRI shows canal stenosis at C4/5 with cord compression and ligamentous injury with posterior soft tissue edema C3-7 levels. Pt with pending surg for stablization and bil nasal fxs. Ot to follow acutely and recommend CIR    OT Assessment  Patient needs continued OT Services    Follow Up Recommendations  Inpatient Rehab    Barriers to Discharge      Equipment Recommendations  Defer to next venue    Recommendations for Other Services Rehab consult  Frequency  Min 3X/week    Precautions / Restrictions Precautions Precautions: Cervical Required Braces or Orthoses: Cervical Brace Restrictions Weight Bearing Restrictions: No   Pertinent Vitals/Pain 8 out 10 ue pain    ADL  Grooming: Performed;Wash/dry face;Moderate assistance Where Assessed - Grooming: Unsupported sitting Lower Body Dressing: Performed;+1 Total assistance Where Assessed - Lower Body Dressing: Supine, head of bed up Toilet Transfer: Simulated;Maximal assistance Toilet Transfer Method: Squat pivot Toilet Transfer Equipment: Regular height toilet Toileting - Clothing Manipulation and Hygiene: Simulated;+1 Total assistance Where Assessed - Toileting Clothing Manipulation and Hygiene: Sit to stand from 3-in-1 or toilet Equipment Used: Gait belt Transfers/Ambulation Related to ADLs: Pt is unable to ambulate safely at this time due to motor planning deficits. Pt with strong anterior lean. ADL Comments: Pt supine on arrival and Ox4. pt c/o tingling stinging and burning in BIL UE. Pt completed supine<>sit and grooming adl sitting. Pt requires (A) for shoulder flexion against gravity to wipe face and hold wash cloth. Pt with slight posterior lean with shoulder  flexion. pt sitting eob with supervision . Pt completed sit<>stand with knee buckling first attempt and required blocking. Pt naturally attempting to widen base of support and requires hand over hand to facilitate good LE alignment. Pt with knee extension and hip flexion with standing. Pt with facilitation at sternum for upright posture and using bed against bil LE to provided tactile input. Pt static standing with total +2 (A).    OT Diagnosis: Generalized weakness;Acute pain  OT Problem List: Decreased strength;Decreased range of motion;Decreased activity tolerance;Impaired balance (sitting and/or standing);Decreased coordination;Decreased safety awareness;Decreased knowledge of use of DME or AE;Pain;Impaired UE functional use OT Treatment Interventions: Self-care/ADL training;Therapeutic exercise;Neuromuscular education;DME and/or AE instruction;Therapeutic activities;Balance training;Patient/family education   OT Goals Acute Rehab OT Goals OT Goal Formulation: With patient Time For Goal Achievement: 03/25/12 Potential to Achieve Goals: Good ADL Goals Pt Will Perform Grooming: with min assist;Sitting, chair;Supported Pt Will Perform Upper Body Bathing: with mod assist;Sitting, chair;Supported Pt Will Perform Upper Body Dressing: with mod assist;Sitting, chair;Supported;with cueing (comment type and amount) Pt Will Transfer to Toilet: with mod assist;Stand pivot transfer;3-in-1 Pt Will Perform Toileting - Clothing Manipulation: with max assist;Sitting on 3-in-1 or toilet Miscellaneous OT Goals Miscellaneous OT Goal #1: Pt will perform UE HEP x 10 reps (shoulder flexion, supination/pronation, elbow flexion / extension)  Visit Information  Last OT Received On: 03/11/12 Assistance Needed: +2 PT/OT Co-Evaluation/Treatment: Yes    Subjective Data  Subjective: "my hands and my feet" - describing where pain is located Patient Stated Goal: Cervical MRI shows canal stenosis at C4/5 with cord  compression and ligamentous injury with posterior soft tissue edema C3-7 levels.   Prior Functioning  Vision/Perception  Home Living Lives With: Spouse;Other (Comment) (x3 kids) Available Help at  Discharge: Family Type of Home: House Home Access: Stairs to enter [employer] of Steps: 10 Entrance Stairs-Rails: Can reach both Home Layout: Two level;1/2 bath on main level Alternate Level Stairs-Number of Steps: 15 Alternate Level Stairs-Rails: Right Bathroom Shower/Tub: Walk-in shower;Door [employer]: Standard (small room for bathroom) Bathroom Accessibility: Yes How Accessible: Accessible via walker Home Adaptive Equipment: Straight cane;Shower chair without back;Walker - rolling Additional Comments: work description: Doctor, hospital, drive fork lift,  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 Prior Function Level of Independence: Independent Able to Take Stairs?: Yes Driving: Yes Vocation: Full time employment Communication Communication: No difficulties Dominant Hand: Right      Cognition  Overall Cognitive Status: Appears within functional limits for tasks assessed/performed Arousal/Alertness: Awake/alert Orientation Level: Appears intact for tasks assessed Behavior During Session: [facility] for tasks performed    Extremity/Trunk Assessment Right Upper Extremity Assessment RUE ROM/Strength/Tone: Deficits RUE ROM/Strength/Tone Deficits: shoulder flexion 90 degrees (WFL not assessed high due to cervical injury), wrist extension present slightly less than WFL, elbow flexion 4 out 5 AROM, elbow extension 2- out 5 using compensatory strategies to extend., tricep 4 out 5  RUE Sensation: WFL - Light Touch RUE Coordination: Deficits RUE Coordination Deficits: grasp of hand 2 - out 5 and unable to perform fine motor task Left Upper Extremity Assessment LUE ROM/Strength/Tone: Deficits LUE ROM/Strength/Tone Deficits: shoulder flexion 90 degrees (WFL not assessed high due to cervical injury),  wrist extension presents WFL, elbow flexion 4 out 5 AROM, elbow extension 2- out 5 using compensatory strategies to extend., tricep 4 out 5  LUE Sensation: WFL - Light Touch LUE Coordination: Deficits LUE Coordination Deficits: grasp 2- out 5 and deficits with fine motor Right Lower Extremity Assessment RLE ROM/Strength/Tone: [facility] for tasks assessed;Deficits RLE ROM/Strength/Tone Deficits: weakness at Bil hip flexors at 4/5 otherwise>=4+/5 ;ankles and intrinsics appear less coordinated RLE Sensation: Deficits RLE Sensation Deficits: "pins and needles" Left Lower Extremity Assessment LLE ROM/Strength/Tone: Deficits Trunk Assessment Trunk Assessment: Normal   Mobility Bed Mobility Bed Mobility: Rolling Right;Right Sidelying to Sit;Sitting - Scoot to [employer] of Bed Rolling Right: 3: Mod assist Right Sidelying to Sit: 3: Mod assist Sitting - Scoot to Edge of Bed: 4: Min guard Details for Bed Mobility Assistance:   vc tactile cues to assist through roll with coordination of UE's ; truncal assist  Transfers Transfers: Sit to Stand Sit to Stand: 1: +2 Total assist;Without upper extremity assist;From bed Sit to Stand: Patient Percentage: 50% Stand to Sit: 1: +2 Total assist;To bed Stand to Sit: Patient Percentage: 50% Details for Transfer Assistance: multimodal cues for safe technique;   vc/tc's to assist through roll with coordination of UE's ; truncal assist    Exercise    Balance Balance Balance Assessed: Yes Static Sitting Balance Static Sitting - Balance Support: No upper extremity supported Static Sitting - Level of Assistance: 5: Stand by assistance Dynamic Sitting Balance Dynamic Sitting - Balance Support: Feet supported Dynamic Sitting - Level of Assistance: 5: Stand by assistance Dynamic Sitting - Balance Activities: Forward lean/weight shifting;Lateral lean/weight shifting  End of Session OT - End of Session Equipment Utilized During Treatment: Gait belt Activity Tolerance:  Patient tolerated treatment well Patient left: in chair;with call bell/phone within reach Nurse Communication: Mobility status  GO     Lucile Shutters 03/11/2012, 1:35 PM Pager: [phone]

## 2012-03-11 NOTE — Consult Note (Signed)
Physical Medicine and Rehabilitation Consult Reason for Consult: SCI with central cord syndrome Referring Physician:  Dr. Venetia Maxon.   HPI: Edward [name] is a 61 y.o. male with history of CAD, 24 hour history of diarrhea who got dizzy with near syncope and fall on 03/10/12. Patient with hypotension and complaints of pain in head and neck with tingling bilateral hands.  MRI cervical spine revealed intraspinous edema C3-C7 with multilevel spondylosis most prominent at C4-C5 with edema/and or gliosis and cord compression.  CT head negative for acute changes.  CT maxillofacial  With comminuted depressed bilateral nasal bone fractues with bowing of nasal septum to right. Drs Venetia Maxon and Sanger consulted for input. Patient with central cord syndrome and will require cervical decompression pending cardiac clearance.  Nasal fracture repair to be coordinated with NS. PT/OT evaluations ordered. MD recommending CIR.   Review of Systems  HENT: Positive for neck pain. Negative for hearing loss.   Eyes: Negative for blurred vision and double vision.  Respiratory: Negative for shortness of breath.   Cardiovascular: Negative for chest pain and palpitations.  Gastrointestinal: Negative for nausea, vomiting, diarrhea and constipation.  Genitourinary: Negative for dysuria and urgency.  Musculoskeletal: Positive for myalgias.  Neurological: Positive for tingling (bilateral hands) and focal weakness. Negative for dizziness and headaches.       Chronic intermittent RLE instability.   Psychiatric/Behavioral: The patient is not nervous/anxious and does not have insomnia.    Past Medical History  Diagnosis Date  . Measles   . Mumps   . Chicken pox   . Whooping cough   . Pneumonia   . Hypertension   . Coronary artery disease     a. 1999 s/p MI with cath/PCI;  b. 05/1999 Ex Cardiolite EF 68%, no ishcemia.  . Dyslipidemia   . Hypokalemia   . History of tobacco abuse   . Near syncope   . Nasal bones, closed  fracture     a. 02/2012 in setting of presyncope/fall  . Injury of cervical spine     a. 02/2012 C4/5   Past Surgical History  Procedure Date  . [place] tendon surgery 1989  . Achilles tendon surgery 1989  . Cardiac catheterization 06/28/1998    single vessel CAD involving the distal RCA/PTCA and stenting of the distal RCA//EF- 50-55%  . Nm myoview ltd 05/17/2009    normal stress nuclear study/no evidence of ischemia/EF- 68%   Family History  Problem Relation Age of Onset  . Heart attack Father   . Cancer Mother    Social History:  Married.  Works as a Doctor, hospital.  He reports that he quit smoking about 25 years ago. His smoking use included Cigarettes. He has a 20 pack-year smoking history. He does not have any smokeless tobacco history on file. He reports that he drinks alcohol- 1-2 glasses of wine at nights.  His drug history not on file. Wife does not work and can provide supervision past discharge.   Allergies: No Known Allergies  Medications Prior to Admission  Medication Sig Dispense Refill  . amLODipine (NORVASC) 5 MG tablet Take 5 mg by mouth daily.      Marland Kitchen aspirin 81 MG tablet Take 81 mg by mouth daily.      . fish oil-omega-3 fatty acids 1000 MG capsule Take 3 g by mouth daily. Take three tablets by mouth once daily      . lisinopril-hydrochlorothiazide (PRINZIDE,ZESTORETIC) 20-25 MG per tablet Take 1 tablet by mouth daily.      Marland Kitchen  metoprolol succinate (TOPROL-XL) 50 MG 24 hr tablet Take 50 mg by mouth daily. Take with or immediately following a meal.      . Multiple Vitamin (MULTIVITAMIN) tablet Take 1 tablet by mouth daily.      . potassium chloride (K-DUR,KLOR-CON) 10 MEQ tablet Take 10 mEq by mouth daily.      . simvastatin (ZOCOR) 80 MG tablet Take 80 mg by mouth at bedtime.      . nitroGLYCERIN (NITROSTAT) 0.4 MG SL tablet place 1 tablet under the tongue if needed for chest pain  25 tablet  PRN    Home: Home Living Lives With: Spouse;Other (Comment) (x3 kids) Type  of Home: House Home Access: Stairs to enter [employer] of Steps: 10 Entrance Stairs-Rails: Can reach both Home Layout: Two level;1/2 bath on main level Alternate Level Stairs-Number of Steps: 15 Alternate Level Stairs-Rails: Right Bathroom Shower/Tub: Walk-in shower;Door [employer]: Standard (small room for bathroom) Bathroom Accessibility: Yes How Accessible: Accessible via walker Home Adaptive Equipment: Straight cane;Shower chair without back;Walker - rolling Additional Comments: work description: Doctor, hospital, drive fork lift,  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 rate 14, height 6' (1.829 m), weight 94.348 kg (208 lb), SpO2 99.00%. Physical Exam  Nursing note and vitals reviewed. Constitutional: He is oriented to person, place, and time. He appears well-developed and well-nourished.  HENT:  Head: Normocephalic and atraumatic.       Sutured laceration nasal bridge.   Eyes: Pupils are equal, round, and reactive to light.  Neck:       Cervical collar in place.   Cardiovascular: Normal rate and regular rhythm.   Pulmonary/Chest: Effort normal and breath sounds normal.  Abdominal: Soft. Bowel sounds are normal.  Musculoskeletal: Normal range of motion. He exhibits no edema.       Well healed old incisions bilateral patella.   Neurological: He is alert and oriented to person, place, and time.       Distal  weakness BUE.  Skin: Skin is warm and dry.  Psychiatric: He has a normal mood and affect. His behavior is normal. Judgment and thought content normal.  motor strength: 2 minus/5 in biceps triceps finger flexors finger extensors 3 minus/5 in the deltoids 3/5 in the right hip flexor 4/5 knee extensor 5/5 ankle dorsiflexor and plantar flexor bilateral 4/5 left hip flexor and knee extensor Sensation is intact to light touch in both upper and lower extremities  Results for orders placed during the hospital encounter of 03/10/12 (from the past 24 hour(s))  CBC WITH DIFFERENTIAL     Status: Abnormal   Collection Time   03/10/12  2:30 PM      Component Value Range   WBC 6.1  4.0 - 10.5 K/uL   RBC 4.24  4.22 - 5.81 MIL/uL   Hemoglobin 13.5  13.0 - 17.0 g/dL   HCT 40.9 (*) 81.1 - 91.4 %   MCV 90.8  78.0 - 100.0 fL   MCH 31.8  26.0 - 34.0 pg   MCHC  35.1  30.0 - 36.0 g/dL   RDW 16.1  09.6 - 04.5 %   Platelets 185  150 - 400 K/uL   Neutrophils Relative 70  43 - 77 %   Neutro Abs 4.3  1.7 - 7.7 K/uL   Lymphocytes Relative 21  12 - 46 %   Lymphs Abs 1.3  0.7 - 4.0 K/uL   Monocytes Relative 7  3 - 12 %   Monocytes Absolute 0.5  0.1 - 1.0 K/uL   Eosinophils Relative 1  0 - 5 %   Eosinophils Absolute 0.1  0.0 - 0.7 K/uL   Basophils Relative 0  0 - 1 %   Basophils Absolute 0.0  0.0 - 0.1 K/uL  COMPREHENSIVE METABOLIC PANEL     Status: Abnormal   Collection Time   03/10/12  2:30 PM      Component Value Range   Sodium 138  135 - 145 mEq/L   Potassium 3.9  3.5 - 5.1 mEq/L   Chloride 106  96 - 112 mEq/L   CO2 23  19 - 32 mEq/L   Glucose, Bld 117 (*) 70 - 99 mg/dL   BUN 16  6 - 23 mg/dL   Creatinine, Ser 4.09  0.50 - 1.35 mg/dL   Calcium 9.3  8.4 - 81.1 mg/dL   Total Protein 6.7  6.0 - 8.3 g/dL   Albumin 3.8  3.5 - 5.2 g/dL   AST 26  0 - 37 U/L   ALT 16  0 - 53 U/L   Alkaline Phosphatase 39  39 - 117 U/L   Total Bilirubin 0.5  0.3 - 1.2 mg/dL   GFR calc non Af Amer 55 (*) >90 mL/min   GFR  calc Af Amer 63 (*) >90 mL/min  TROPONIN I     Status: Normal   Collection Time   03/10/12  2:30 PM      Component Value Range   Troponin I <0.30  <0.30 ng/mL  POCT I-STAT TROPONIN I     Status: Normal   Collection Time   03/10/12 10:03 PM      Component Value Range   Troponin i, poc 0.01  0.00 - 0.08 ng/mL   Comment 3               Assessment/Plan: Diagnosis: cervical myelopathy with central cord syndrome 1. Does the need for close, 24 hr/day medical supervision in concert with the patient's rehab needs make it unreasonable for this patient to be served in a less intensive setting? Potentially 2. Co-Morbidities requiring supervision/potential complications: hypertension, pain control, 3. Due to bladder management, bowel management, safety, skin/wound care, medication administration and pain management, does the patient require 24 hr/day rehab nursing? Potentially 4. Does the patient require coordinated care of a physician, rehab nurse, PT (1-2 hrs/day, 55 days/week) and OT (1-2 hrs/day, 5 days/week) to address physical and functional deficits in the context of the above medical diagnosis(es)? Potentially Addressing deficits in the following areas: balance, endurance, locomotion, strength, transferring, bowel/bladder control, bathing, dressing and toileting 5. Can the patient actively participate in an intensive therapy program of at least 3 hrs of therapy per day at least 5 days per week? Yes 6. The potential for patient to make measurable gains while on inpatient rehab is good 7. Anticipated functional outcomes upon discharge from inpatient rehab are supervision mobility with PT, min assist with ADLs with OT, not applicable with SLP. 8. Estimated rehab length of stay to reach the above  functional goals is: 2 weeks 9. Does the patient have adequate social supports to accommodate these discharge functional goals? Potentially 10. Anticipated D/C setting: Home 11. Anticipated post D/C  treatments: Outpt therapy 12. Overall Rehab/Functional Prognosis: excellent  RECOMMENDATIONS: This patient's condition is appropriate for continued rehabilitative care in the following setting: the patient will undergo anterior cervical decompression and fusion on Wednesday, August 14. Rehabilitation RN will reevaluate postoperative. It is anticipated that he will require rehabilitation at a CIR level once he recovers from his surgery. Patient has agreed to participate in recommended program. Yes Note that insurance prior authorization may be required for reimbursement for recommended care.  Comment:    03/11/2012

## 2012-03-11 NOTE — Progress Notes (Signed)
Subjective: Patient reports a bit better in right arm  Objective: Vital signs in last 24 hours: Temp:  [97 F (36.1 C)-99.6 F (37.6 C)] 98.7 F (37.1 C) (08/12 0740) Pulse Rate:  [61-95] 89  (08/12 0800) Resp:  [13-24] 14  (08/12 0800) BP: (100-166)/(60-91) 145/78 mmHg (08/12 0800) SpO2:  [93 %-100 %] 99 % (08/12 0800) Weight:  [94.348 kg (208 lb)] 94.348 kg (208 lb) (08/11 2300)  Intake/Output from previous day: 08/11 0701 - 08/12 0700 In: 2065 [P.O.:480; I.V.:1575; IV Piggyback:10] Out: 375 [Urine:375] Intake/Output this shift: Total I/O In: 120 [P.O.:120] Out: -   Physical Exam: Some right triceps strength has returned, although still poor (2/5), left arm strength stable.  Still no useful hand function.  Lab Results:  Basename 03/10/12 1430  WBC 6.1  HGB 13.5  HCT 38.5*  PLT 185   BMET  Basename 03/10/12 1430  NA 138  K 3.9  CL 106  CO2 23  GLUCOSE 117*  BUN 16  CREATININE 1.35  CALCIUM 9.3    Studies/Results: Ct Head Wo Contrast  03/10/2012  *RADIOLOGY REPORT*  Clinical Data:  Fall, head injury, nose laceration, history of seizures  CT HEAD WITHOUT CONTRAST CT MAXILLOFACIAL WITHOUT CONTRAST CT CERVICAL SPINE WITHOUT CONTRAST  Technique:  Multidetector CT imaging of the head, cervical spine, and maxillofacial structures were performed using the standard protocol without intravenous contrast. Multiplanar CT image reconstructions of the cervical spine and maxillofacial structures were also generated.  Comparison:  None.  CT HEAD  Findings: No evidence of parenchymal hemorrhage or extra-axial fluid collection. No mass lesion, mass effect, or midline shift.  No CT evidence of acute infarction.  Subcortical white matter and periventricular small vessel ischemic changes.  Cerebral volume is age appropriate.  No ventriculomegaly.  The visualized paranasal sinuses are essentially clear. The mastoid air cells are unopacified.  No evidence of calvarial fracture.   IMPRESSION: No evidence of acute intracranial abnormality.  Small vessel ischemic changes.  See below for maxillofacial findings.  CT MAXILLOFACIAL  Findings:  Comminuted, depressed bilateral nasal bone fractures. Additional fractures of the anterior/mid nasal septum, which bows to the right.  Overlying soft tissue swelling/laceration of the nasal bridge.  Small amount of layering fluid/hemorrhage in the left sphenoid sinus (series 4/image 59).  The visualized paranasal sinuses are otherwise clear.  The mastoid air cells are unopacified.  The bilateral orbits, including the retroconal soft tissues, are within normal limits.  Small bilateral cervical lymph nodes.  6 mm intraparotid lymph node on the left (series 3/image 38).  IMPRESSION: Comminuted, depressed bilateral nasal bone fractures.  Additional fractures of the anterior/mid nasal septum, which bows to the right.  Overlying soft tissue swelling/laceration of the nasal bridge.  CT CERVICAL SPINE  Findings:   Straightening of the cervical spine, positive positional.  No evidence of fracture or dislocation.  Vertebral body heights are maintained.  The dens appears intact.  No prevertebral soft tissue swelling.  Moderate multilevel degenerative changes.  Visualized thyroid is unremarkable.  Visualized lung apices are clear.  IMPRESSION: No evidence of traumatic injury to the cervical spine.  Moderate multilevel degenerative changes.  Original Report Authenticated By: Charline Bills, M.D.   Ct Cervical Spine Wo Contrast  03/10/2012  *RADIOLOGY REPORT*  Clinical Data:  Fall, head injury, nose laceration, history of seizures  CT HEAD WITHOUT CONTRAST CT MAXILLOFACIAL WITHOUT CONTRAST CT CERVICAL SPINE WITHOUT CONTRAST  Technique:  Multidetector CT imaging of the head, cervical spine, and maxillofacial structures were  performed using the standard protocol without intravenous contrast. Multiplanar CT image reconstructions of the cervical spine and maxillofacial  structures were also generated.  Comparison:  None.  CT HEAD  Findings: No evidence of parenchymal hemorrhage or extra-axial fluid collection. No mass lesion, mass effect, or midline shift.  No CT evidence of acute infarction.  Subcortical white matter and periventricular small vessel ischemic changes.  Cerebral volume is age appropriate.  No ventriculomegaly.  The visualized paranasal sinuses are essentially clear. The mastoid air cells are unopacified.  No evidence of calvarial fracture.  IMPRESSION: No evidence of acute intracranial abnormality.  Small vessel ischemic changes.  See below for maxillofacial findings.  CT MAXILLOFACIAL  Findings:  Comminuted, depressed bilateral nasal bone fractures. Additional fractures of the anterior/mid nasal septum, which bows to the right.  Overlying soft tissue swelling/laceration of the nasal bridge.  Small amount of layering fluid/hemorrhage in the left sphenoid sinus (series 4/image 59).  The visualized paranasal sinuses are otherwise clear.  The mastoid air cells are unopacified.  The bilateral orbits, including the retroconal soft tissues, are within normal limits.  Small bilateral cervical lymph nodes.  6 mm intraparotid lymph node on the left (series 3/image 38).  IMPRESSION: Comminuted, depressed bilateral nasal bone fractures.  Additional fractures of the anterior/mid nasal septum, which bows to the right.  Overlying soft tissue swelling/laceration of the nasal bridge.  CT CERVICAL SPINE  Findings:   Straightening of the cervical spine, positive positional.  No evidence of fracture or dislocation.  Vertebral body heights are maintained.  The dens appears intact.  No prevertebral soft tissue swelling.  Moderate multilevel degenerative changes.  Visualized thyroid is unremarkable.  Visualized lung apices are clear.  IMPRESSION: No evidence of traumatic injury to the cervical spine.  Moderate multilevel degenerative changes.  Original Report Authenticated By: Charline Bills, M.D.   Mr Cervical Spine Wo Contrast  03/10/2012  *RADIOLOGY REPORT*  Clinical Data: Fall.  Hyperextension injury.  Hand and feet pain.  MRI CERVICAL SPINE WITHOUT CONTRAST  Technique:  Multiplanar and multiecho pulse sequences of the cervical spine, to include the craniocervical junction and cervicothoracic junction, were obtained according to standard protocol without intravenous contrast.  Comparison: CT cervical spine 03/10/2012.  Findings: Intraspinous edema C3-C7 suggesting soft tissue injury. Additionally, there is slight increased signal within the anterior aspect of the C6-7 disc space and injury at this level is not excluded.  Multilevel cervical spondylotic changes most prominent C4-5 level as detailed below.  Within the compressed C4-5 cord, there is increased signal suggestive of edema and / or gliosis.  This does not appear hemorrhagic on gradient sequence.  This may contribute to the patient's findings.  Both vertebral arteries are patent.  C2-3:  No significant spinal stenosis or foraminal narrowing.  C3-4:  Broad-based disc osteophyte.  Spinal stenosis with mild cord contact/flattening.  Uncinate bony overgrowth with moderate foraminal narrowing.  C4-5:  Prominent broad-based disc osteophyte complex.  Cord compression.  Increased signal within the compressed cord as discussed above.  Uncinate hypertrophy.  Marked bilateral foraminal narrowing greater on the right.  C5-6:  Broad-based disc osteophyte complex slightly greater to the right.  Minimal cord contact.  Minimal foraminal narrowing.  C6-7:  Broad-based disc osteophyte slightly more notable to the left.  No cord compression.  Uncinate hypertrophy greater on the left.  Marked left foraminal narrowing.  Minimal right foraminal narrowing.  C7-T1:  Disc degeneration.  Broad-based disc osteophyte complex. Mild spinal stenosis.  Uncinate hypertrophy greater  on the left. Moderate to marked left-sided  and minimal right-sided foraminal  narrowing.  IMPRESSION: Intraspinous edema C3-C7 suggesting soft tissue injury. Additionally, there is slight increased signal within the anterior aspect of the C6-7 disc space and injury at this level is not excluded.  Multilevel cervical spondylotic changes most prominent C4-5 level as detailed below.  Within the compressed C4-5 cord, there is increased signal suggestive of edema and / or gliosis.  This does not appear hemorrhagic on gradient sequence.  This may contribute to the patient's findings.  C3-4:  Broad-based disc osteophyte.  Spinal stenosis with mild cord contact/flattening.  Uncinate bony overgrowth with moderate foraminal narrowing.  C4-5:  Prominent broad-based disc osteophyte complex.  Cord compression.  Increased signal within the compressed cord as discussed above.  Uncinate hypertrophy.  Marked bilateral foraminal narrowing greater on the right.  C5-6:  Broad-based disc osteophyte complex slightly greater to the right.  Minimal cord contact.  Minimal foraminal narrowing.  C6-7:  Broad-based disc osteophyte slightly more notable to the left.  No cord compression.  Uncinate hypertrophy greater on the left.  Marked left foraminal narrowing.  Minimal right foraminal narrowing.  C7-T1:  Disc degeneration.  Broad-based disc osteophyte complex. Mild spinal stenosis.  Uncinate hypertrophy greater on the left. Moderate to marked left-sided  and minimal right-sided foraminal narrowing.  Critical Value/emergent results were called by telephone at the time of interpretation on 03/10/2012 at 7:58 p.m. to [facility] physician's assistant., who verbally acknowledged these results.  Original Report Authenticated By: Fuller Canada, M.D.   Ct Maxillofacial Wo Cm  03/10/2012  *RADIOLOGY REPORT*  Clinical Data:  Fall, head injury, nose laceration, history of seizures  CT HEAD WITHOUT CONTRAST CT MAXILLOFACIAL WITHOUT CONTRAST CT CERVICAL SPINE WITHOUT CONTRAST  Technique:  Multidetector CT imaging of the head,  cervical spine, and maxillofacial structures were performed using the standard protocol without intravenous contrast. Multiplanar CT image reconstructions of the cervical spine and maxillofacial structures were also generated.  Comparison:  None.  CT HEAD  Findings: No evidence of parenchymal hemorrhage or extra-axial fluid collection. No mass lesion, mass effect, or midline shift.  No CT evidence of acute infarction.  Subcortical white matter and periventricular small vessel ischemic changes.  Cerebral volume is age appropriate.  No ventriculomegaly.  The visualized paranasal sinuses are essentially clear. The mastoid air cells are unopacified.  No evidence of calvarial fracture.  IMPRESSION: No evidence of acute intracranial abnormality.  Small vessel ischemic changes.  See below for maxillofacial findings.  CT MAXILLOFACIAL  Findings:  Comminuted, depressed bilateral nasal bone fractures. Additional fractures of the anterior/mid nasal septum, which bows to the right.  Overlying soft tissue swelling/laceration of the nasal bridge.  Small amount of layering fluid/hemorrhage in the left sphenoid sinus (series 4/image 59).  The visualized paranasal sinuses are otherwise clear.  The mastoid air cells are unopacified.  The bilateral orbits, including the retroconal soft tissues, are within normal limits.  Small bilateral cervical lymph nodes.  6 mm intraparotid lymph node on the left (series 3/image 38).  IMPRESSION: Comminuted, depressed bilateral nasal bone fractures.  Additional fractures of the anterior/mid nasal septum, which bows to the right.  Overlying soft tissue swelling/laceration of the nasal bridge.  CT CERVICAL SPINE  Findings:   Straightening of the cervical spine, positive positional.  No evidence of fracture or dislocation.  Vertebral body heights are maintained.  The dens appears intact.  No prevertebral soft tissue swelling.  Moderate multilevel degenerative changes.  Visualized  thyroid is  unremarkable.  Visualized lung apices are clear.  IMPRESSION: No evidence of traumatic injury to the cervical spine.  Moderate multilevel degenerative changes.  Original Report Authenticated By: Charline Bills, M.D.    Assessment/Plan: Awaiting cardiology clearance for surgery. Plan ACDF C4/5 on 03/13/2012. Begin PT.  Will also need Rehab.    LOS: 1 day    Dorian Heckle, MD 03/11/2012, 8:33 AM

## 2012-03-11 NOTE — ED Provider Notes (Signed)
Medical screening examination/treatment/procedure(s) were conducted as a shared visit with non-physician practitioner(s) and myself.  I personally evaluated the patient during the encounter   Gwyneth Sprout, MD 03/11/12 [id number]

## 2012-03-11 NOTE — Progress Notes (Signed)
  Unable to complete MRSA pcr upon admission d/t bilateral nasal fx.    Will continue to monitor.

## 2012-03-11 NOTE — Progress Notes (Addendum)
Attempt to reach Dr Kelly Splinter. Given # [phone]. Left message at that #. Operator does not have another number for this MD. Attempted to get # of Shawn Rayburn, PA, and was given her old # with trauma services. Unable to contact this MD, no # on Amion. I wanted to let her know that Dr Venetia Maxon now plans to rake pt. To OR Wed instead of Tues, as the patient states Dr Kelly Splinter wanted to perform her surgery in tandem with Dr. Rush Farmer, if possible. I called neuro OR, they state pt is scheduled for both neck and nasal surgeries on Wed 03/13/12 morning.

## 2012-03-11 NOTE — Progress Notes (Signed)
Initial review for inpatient status is complete. 

## 2012-03-11 NOTE — Progress Notes (Signed)
On repeat exam, patient's motor exam is unchanged.  His sensory exam is also unchanged.

## 2012-03-12 ENCOUNTER — Encounter (HOSPITAL_COMMUNITY): Payer: Self-pay | Admitting: Anesthesiology

## 2012-03-12 MED ORDER — HYDROCHLOROTHIAZIDE 25 MG PO TABS
25.0000 mg | ORAL_TABLET | Freq: Every day | ORAL | Status: DC
  Filled 2012-03-12 (×2): qty 1

## 2012-03-12 MED ORDER — PANTOPRAZOLE SODIUM 40 MG PO TBEC
40.0000 mg | DELAYED_RELEASE_TABLET | Freq: Every day | ORAL | Status: DC
  Administered 2012-03-12: 40 mg via ORAL
  Filled 2012-03-12: qty 1

## 2012-03-12 MED ORDER — LISINOPRIL 20 MG PO TABS
20.0000 mg | ORAL_TABLET | Freq: Every day | ORAL | Status: DC
  Filled 2012-03-12 (×2): qty 1

## 2012-03-12 NOTE — Progress Notes (Signed)
Physical Therapy Treatment Patient Details Name: Edward [name] MRN: [id number] DOB: 03-13-1951 Today's Date: 03/12/2012 Time: 4098-1191 PT Time Calculation (min): 24 min  PT Assessment / Plan / Recommendation Comments on Treatment Session  pt's mobility improved from eval in bed mobility standing and his ability to participate in pregait and gait activities today    Follow Up Recommendations  Inpatient Rehab    Barriers to Discharge        Equipment Recommendations  Defer to next venue    Recommendations for Other Services Rehab consult  Frequency Min 3X/week   Plan Discharge plan remains appropriate    Precautions / Restrictions Precautions Precautions: Cervical Required Braces or Orthoses: Cervical Brace   Pertinent Vitals/Pain     Mobility  Bed Mobility Bed Mobility: Rolling Right;Right Sidelying to Sit;Sitting - Scoot to Edge of Bed Rolling Right: 4: Min assist;With rail Right Sidelying to Sit: 4: Min guard;With rails;HOB flat Sitting - Scoot to Edge of Bed: 4: Min assist;With rail Details for Bed Mobility Assistance: pt initiated movement this session and only required (A) side<>sit to get Lt UE onto elbow to push up. Pt with proprioception deficits affecting bed mobility and hand placement Transfers Transfers: Sit to Stand;Stand to Sit Sit to Stand: 1: +2 Total assist;Without upper extremity assist;From bed Sit to Stand: Patient Percentage: 70% Stand to Sit: 1: +2 Total assist;To bed Stand to Sit: Patient Percentage: 70% Details for Transfer Assistance: pt with multimodal cue and safety. pt able to position bil le in correct position with min v/c Ambulation/Gait Ambulation/Gait Assistance: 1: +2 Total assist Ambulation/Gait: Patient Percentage: 60% Ambulation Distance (Feet): 20 Feet Assistive device: 2 person hand held assist Ambulation/Gait Assistance Details: Ataxic/incoordinated gait with variable BOS and some scissoring.  Moderate support of trunk and  assist through arms needed to control his gait and balance in an upright posture Gait Pattern: Step-through pattern;Decreased step length - right;Decreased step length - left;Decreased stride length;Ataxic;Scissoring Stairs: No    Exercises     PT Diagnosis:    PT Problem List:   PT Treatment Interventions:     PT Goals Acute Rehab PT Goals Time For Goal Achievement: 03/18/12 Potential to Achieve Goals: Good PT Goal: Supine/Side to Sit - Progress: Partly met PT Goal: Sit to Stand - Progress: Progressing toward goal PT Transfer Goal: Bed to Chair/Chair to Bed - Progress: Progressing toward goal PT Goal: Ambulate - Progress: Progressing toward goal  Visit Information  Last PT Received On: 03/12/12 Assistance Needed: +2 PT/OT Co-Evaluation/Treatment: Yes    Subjective Data  Subjective: My hands and feet are about the same   Cognition  Overall Cognitive Status: Appears within functional limits for tasks assessed/performed Arousal/Alertness: Awake/alert Orientation Level: Appears intact for tasks assessed Behavior During Session: [facility] for tasks performed    Balance  Balance Balance Assessed: Yes Static Sitting Balance Static Sitting - Balance Support: No upper extremity supported Static Sitting - Level of Assistance: 5: Stand by assistance Static Standing Balance Static Standing - Balance Support: Bilateral upper extremity supported Static Standing - Level of Assistance: 1: +2 Total assist;Patient percentage (comment);Other (comment) (60%) Static Standing - Comment/# of Minutes: pt using UE's to press against therapist to stabilize himself Dynamic Standing Balance Dynamic Standing - Balance Support: Bilateral upper extremity supported;During functional activity (pregait activity incl. w/shift and stepping) Dynamic Standing - Level of Assistance: 1: +2 Total assist;Patient percentage (comment);Other (comment) (60%)  End of Session PT - End of Session Equipment Utilized During  Treatment: Gait belt  Activity Tolerance: Patient tolerated treatment well Patient left: in chair;with call bell/phone within reach Nurse Communication: Mobility status   GP     Tiarah Shisler, Eliseo Gum 03/12/2012, 2:47 PM

## 2012-03-12 NOTE — Progress Notes (Signed)
03/11/12 1100  PT Visit Information  Last PT Received On 03/11/12  Assistance Needed +2  PT/OT Co-Evaluation/Treatment Yes  PT Time Calculation  PT Start Time 0949  PT Stop Time 1026  PT Time Calculation (min) 37 min  Subjective Data  Subjective My hand and feet feel like "pins and needles"  Patient Stated Goal back to my normal [place]  Precautions  Precautions Cervical  Required Braces or Orthoses Cervical Brace  Restrictions  Weight Bearing Restrictions No  Home Living  Lives With Spouse;Other (Comment) (x3 kids)  Available Help at Discharge Family  Type of Home House  Home Access Stairs to enter  Entrance Stairs-Number of Steps 10  Entrance Stairs-Rails Can reach both  Home Layout Two level;1/2 bath on main level  Alternate Level Stairs-Number of Steps 15  Alternate Level Stairs-Rails Right  Bathroom Shower/Tub Walk-in shower;Door  Horticulturist, commercial Yes  How Accessible Accessible via walker  Home Adaptive Equipment Straight cane;Shower chair without back;Walker - rolling  Additional Comments work description: Doctor, hospital, drive fork lift,  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 Yes  Driving Yes  Vocation Full time employment  Communication  Communication No difficulties  Cognition  Overall Cognitive Status Appears within functional limits for tasks assessed/performed  Arousal/Alertness Awake/alert  Orientation Level Appears intact for tasks assessed  Behavior During Session [facility] for tasks performed  Right Lower Extremity Assessment  RLE ROM/Strength/Tone [facility] for tasks assessed;Deficits  RLE ROM/Strength/Tone Deficits weakness at Bil hip flexors at 4/5 otherwise>=4+/5 ;ankles and intrinsics appear less coordinated  RLE Sensation Deficits  RLE Sensation Deficits "pins and needles"  Left Lower Extremity Assessment  LLE ROM/Strength/Tone Deficits  Trunk Assessment  Trunk  Assessment Normal  Bed Mobility  Bed Mobility Rolling Right;Right Sidelying to Sit;Sitting - Scoot to Edge of Bed  Rolling Right 3: Mod assist  Right Sidelying to Sit 3: Mod assist  Sitting - Scoot to Edge of Bed 4: Min guard  Details for Bed Mobility Assistance vc/tc's to assist through roll with coordination of UE's ; truncal assist  Transfers  Transfers Sit to Stand;Stand to Sit;Squat Pivot Transfers (x3)  Sit to Stand 1: +2 Total assist;Without upper extremity assist;From bed  Sit to Stand: Patient Percentage 50%  Stand to Sit 1: +2 Total assist;To bed  Stand to Sit: Patient Percentage 50%  Squat Pivot Transfers 3: Mod assist;Without upper extremity assistance  Details for Transfer Assistance v/tc's for safe technique ; stability assist, bil knee and truncal support to attain erect posture; pt had difficulty coordinating truncal and extremities to stand in midline and had tendency to list forward greater than posterior  Ambulation/Gait  Ambulation/Gait Assistance Not tested (comment)  Stairs No  Balance  Balance Assessed Yes  Static Sitting Balance  Static Sitting - Balance Support No upper extremity supported;Feet supported  Static Sitting - Level of Assistance 5: Stand by assistance  Dynamic Sitting Balance  Dynamic Sitting - Balance Support Feet supported;No upper extremity supported;During functional activity  Dynamic Sitting - Level of Assistance 5: Stand by assistance  Dynamic Sitting - Balance Activities Forward lean/weight shifting;Lateral lean/weight shifting  PT - End of Session  Equipment Utilized During Treatment Gait belt  Activity Tolerance Patient tolerated treatment well  Patient left in chair;with call bell/phone within reach  Nurse Communication Mobility status  PT Assessment  Clinical Impression Statement pt adm after fall sustaining nasal bone fx and central cord injury,  scheduled for [place] 8/14.  Mobility significantly afffected by UE weakness and  inccordination esp at distal extremities as well as inccordination at distal > proximal LE's affecting standing balance/stability. Recommend CIR.  PT Recommendation/Assessment Patient needs continued PT services  PT Problem List Decreased strength;Decreased activity tolerance;Decreased balance;Decreased mobility;Decreased coordination;Decreased knowledge of use of DME;Impaired sensation  PT Therapy Diagnosis  Difficulty walking;Generalized weakness;Acute pain  PT Plan  PT Frequency Min 3X/week  PT Treatment/Interventions DME instruction;Gait training;Functional mobility training;Therapeutic activities;Balance training;Neuromuscular re-education;Patient/family education  PT Recommendation  Recommendations for Other Services Rehab consult  Follow Up Recommendations Inpatient Rehab  Equipment Recommended Defer to next venue  Individuals Consulted  Consulted and Agree with Results and Recommendations Patient  Acute Rehab PT Goals  PT Goal Formulation With patient  Time For Goal Achievement 03/11/12  Potential to Achieve Goals Good  Pt will go Supine/Side to Sit with min assist  PT Goal: Supine/Side to Sit - Progress Goal set today  Pt will go Sit to Stand with min assist  PT Goal: Sit to Stand - Progress Goal set today  Pt will Transfer Bed to Chair/Chair to Bed with min assist  PT Transfer Goal: Bed to Chair/Chair to Bed - Progress Goal set today  Pt will Ambulate 1 - 15 feet;with min assist;with least restrictive assistive device  PT Goal: Ambulate - Progress Goal set today  PT General Charges  $$ ACUTE PT VISIT 1 Procedure  PT Evaluation  $Initial PT Evaluation Tier II 1 Procedure  PT Treatments  $Therapeutic Activity 23-37 mins  Written Expression  Dominant Hand Right   03/12/2012   Bing, PT [phone] (pager)

## 2012-03-12 NOTE — Progress Notes (Signed)
Occupational Therapy Treatment Patient Details Name: Edward [name] MRN: [id number] DOB: [birth year] Today's Date: 03/12/2012 Time: 2130-8657 OT Time Calculation (min): 24 min  OT Assessment / Plan / Recommendation Comments on Treatment Session Pt progressing well this session and has increase independence with bed mobility. Recommend CIR for d/c    Follow Up Recommendations  Inpatient Rehab    Barriers to Discharge       Equipment Recommendations  Defer to next venue    Recommendations for Other Services Rehab consult  Frequency Min 3X/week   Plan Discharge plan remains appropriate    Precautions / Restrictions Precautions Precautions: Cervical Required Braces or Orthoses: Cervical Brace   Pertinent Vitals/Pain Pain in bil hands    ADL  Grooming: Performed;Teeth care;Maximal assistance (used a built up handle on tooth brush hand over hand) Where Assessed - Grooming: Supported sitting (pillows to (A) with elevating BIL UEs) Toilet Transfer: Simulated;+2 Total assistance Toilet Transfer: Patient Percentage: 70% Toilet Transfer Method: Sit to Barista: Raised toilet seat with arms (or 3-in-1 over toilet) Equipment Used: Gait belt Transfers/Ambulation Related to ADLs: Pt ambulated ~ 20 ft around the bed. pt with increased ability to motor plan and initiate movement this session compared to evaluation 03/11/12 ADL Comments: Pt with increased bed mobility and initiation this session. Pt completed bed mobility, sit<>Stand x 2 ambulation and grooming sitting support in chair         OT Goals Acute Rehab OT Goals OT Goal Formulation: With patient Time For Goal Achievement: 03/25/12 Potential to Achieve Goals: Good ADL Goals Pt Will Perform Grooming: with min assist;Sitting, chair;Supported ADL Goal: Grooming - Progress: Progressing toward goals Pt Will Perform Upper Body Bathing: with mod assist;Sitting, chair;Supported ADL Goal: Product manager -  Progress: Progressing toward goals Pt Will Perform Upper Body Dressing: with mod assist;Sitting, chair;Supported;with cueing (comment type and amount) Pt Will Transfer to Toilet: with mod assist;Stand pivot transfer;3-in-1 ADL Goal: Toilet Transfer - Progress: Progressing toward goals Pt Will Perform Toileting - Clothing Manipulation: with max assist;Sitting on 3-in-1 or toilet Miscellaneous OT Goals Miscellaneous OT Goal #1: Pt will perform UE HEP x 10 reps (shoulder flexion, supination/pronation, elbow flexion / extension)  Visit Information  Last OT Received On: 03/12/12 Assistance Needed: +2 PT/OT Co-Evaluation/Treatment: Yes    Subjective Data      Prior Functioning       Cognition  Overall Cognitive Status: Appears within functional limits for tasks assessed/performed Arousal/Alertness: Awake/alert Orientation Level: Appears intact for tasks assessed    Mobility Bed Mobility Bed Mobility: Rolling Right;Right Sidelying to Sit;Sitting - Scoot to Edge of Bed Rolling Right: 4: Min assist;With rail Right Sidelying to Sit: 4: Min guard;With rails;HOB flat Sitting - Scoot to Edge of Bed: 4: Min assist;With rail Details for Bed Mobility Assistance: pt initiated movement this session and only required (A) side<>sit to get Lt UE onto elbow to push up. Pt with proprioception deficits affecting bed mobility adn hand placement Transfers Sit to Stand: 1: +2 Total assist;Without upper extremity assist;From bed Sit to Stand: Patient Percentage: 70% Stand to Sit: 1: +2 Total assist;To bed Stand to Sit: Patient Percentage: 70% Details for Transfer Assistance: pt with multimodal cue and safety. pt able to position bil le in correct position with min v/c   Exercises    Balance Static Standing Balance Static Standing - Balance Support: Bilateral upper extremity supported Static Standing - Level of Assistance: 1: +2 Total assist;Patient percentage (comment) (70) Static Standing -  Comment/# of  Minutes: 8  End of Session OT - End of Session Activity Tolerance: Patient tolerated treatment well Patient left: in chair;with call bell/phone within reach Nurse Communication: Mobility status  GO     Lucile Shutters 03/12/2012, 1:59 PM Pager: [phone]

## 2012-03-12 NOTE — Progress Notes (Signed)
Clinical Social Worker received referral for new snf. Clinical Social Worker reviewed chart and noted that pt planned for surgery tomorrow. Clinical Social Worker noted PT and OT evaluation anticipating that pt will be good CIR candidate. Clinical Social Worker will complete full psychosocial assessment following surgery when pt is re-evaluated for rehab needs.   Jacklynn Lewis, MSW, LCSWA  Clinical Social Work [phone]

## 2012-03-12 NOTE — Progress Notes (Signed)
I met with patient and wife at bedside. I will follow pt's progress postoperatively and pursue insurance approval for inpt rehab. [phone].

## 2012-03-12 NOTE — Progress Notes (Signed)
Subjective: Patient reports "I'm doing ok, my hands just hurt."  Objective: Vital signs in last 24 hours: Temp:  [97.6 F (36.4 C)-98.6 F (37 C)] 97.6 F (36.4 C) (08/13 0700) Pulse Rate:  [57-86] 63  (08/13 0700) Resp:  [8-22] 17  (08/13 0700) BP: (114-165)/(60-110) 165/86 mmHg (08/13 0700) SpO2:  [83 %-100 %] 96 % (08/13 0700)  Intake/Output from previous day: 08/12 0701 - 08/13 0700 In: 2400 [P.O.:600; I.V.:1800] Out: 3525 [Urine:3525] Intake/Output this shift:    Alert, conversant. R>L UE weakness persists, with no hand function bilaterally.  Lab Results:  Basename 03/10/12 1430  WBC 6.1  HGB 13.5  HCT 38.5*  PLT 185   BMET  Basename 03/10/12 1430  NA 138  K 3.9  CL 106  CO2 23  GLUCOSE 117*  BUN 16  CREATININE 1.35  CALCIUM 9.3    Studies/Results: Ct Head Wo Contrast  03/10/2012  *RADIOLOGY REPORT*  Clinical Data:  Fall, head injury, nose laceration, history of seizures  CT HEAD WITHOUT CONTRAST CT MAXILLOFACIAL WITHOUT CONTRAST CT CERVICAL SPINE WITHOUT CONTRAST  Technique:  Multidetector CT imaging of the head, cervical spine, and maxillofacial structures were performed using the standard protocol without intravenous contrast. Multiplanar CT image reconstructions of the cervical spine and maxillofacial structures were also generated.  Comparison:  None.  CT HEAD  Findings: No evidence of parenchymal hemorrhage or extra-axial fluid collection. No mass lesion, mass effect, or midline shift.  No CT evidence of acute infarction.  Subcortical white matter and periventricular small vessel ischemic changes.  Cerebral volume is age appropriate.  No ventriculomegaly.  The visualized paranasal sinuses are essentially clear. The mastoid air cells are unopacified.  No evidence of calvarial fracture.  IMPRESSION: No evidence of acute intracranial abnormality.  Small vessel ischemic changes.  See below for maxillofacial findings.  CT MAXILLOFACIAL  Findings:  Comminuted,  depressed bilateral nasal bone fractures. Additional fractures of the anterior/mid nasal septum, which bows to the right.  Overlying soft tissue swelling/laceration of the nasal bridge.  Small amount of layering fluid/hemorrhage in the left sphenoid sinus (series 4/image 59).  The visualized paranasal sinuses are otherwise clear.  The mastoid air cells are unopacified.  The bilateral orbits, including the retroconal soft tissues, are within normal limits.  Small bilateral cervical lymph nodes.  6 mm intraparotid lymph node on the left (series 3/image 38).  IMPRESSION: Comminuted, depressed bilateral nasal bone fractures.  Additional fractures of the anterior/mid nasal septum, which bows to the right.  Overlying soft tissue swelling/laceration of the nasal bridge.  CT CERVICAL SPINE  Findings:   Straightening of the cervical spine, positive positional.  No evidence of fracture or dislocation.  Vertebral body heights are maintained.  The dens appears intact.  No prevertebral soft tissue swelling.  Moderate multilevel degenerative changes.  Visualized thyroid is unremarkable.  Visualized lung apices are clear.  IMPRESSION: No evidence of traumatic injury to the cervical spine.  Moderate multilevel degenerative changes.  Original Report Authenticated By: Charline Bills, M.D.   Ct Cervical Spine Wo Contrast  03/10/2012  *RADIOLOGY REPORT*  Clinical Data:  Fall, head injury, nose laceration, history of seizures  CT HEAD WITHOUT CONTRAST CT MAXILLOFACIAL WITHOUT CONTRAST CT CERVICAL SPINE WITHOUT CONTRAST  Technique:  Multidetector CT imaging of the head, cervical spine, and maxillofacial structures were performed using the standard protocol without intravenous contrast. Multiplanar CT image reconstructions of the cervical spine and maxillofacial structures were also generated.  Comparison:  None.  CT HEAD  Findings: No evidence of parenchymal hemorrhage or extra-axial fluid collection. No mass lesion, mass effect, or  midline shift.  No CT evidence of acute infarction.  Subcortical white matter and periventricular small vessel ischemic changes.  Cerebral volume is age appropriate.  No ventriculomegaly.  The visualized paranasal sinuses are essentially clear. The mastoid air cells are unopacified.  No evidence of calvarial fracture.  IMPRESSION: No evidence of acute intracranial abnormality.  Small vessel ischemic changes.  See below for maxillofacial findings.  CT MAXILLOFACIAL  Findings:  Comminuted, depressed bilateral nasal bone fractures. Additional fractures of the anterior/mid nasal septum, which bows to the right.  Overlying soft tissue swelling/laceration of the nasal bridge.  Small amount of layering fluid/hemorrhage in the left sphenoid sinus (series 4/image 59).  The visualized paranasal sinuses are otherwise clear.  The mastoid air cells are unopacified.  The bilateral orbits, including the retroconal soft tissues, are within normal limits.  Small bilateral cervical lymph nodes.  6 mm intraparotid lymph node on the left (series 3/image 38).  IMPRESSION: Comminuted, depressed bilateral nasal bone fractures.  Additional fractures of the anterior/mid nasal septum, which bows to the right.  Overlying soft tissue swelling/laceration of the nasal bridge.  CT CERVICAL SPINE  Findings:   Straightening of the cervical spine, positive positional.  No evidence of fracture or dislocation.  Vertebral body heights are maintained.  The dens appears intact.  No prevertebral soft tissue swelling.  Moderate multilevel degenerative changes.  Visualized thyroid is unremarkable.  Visualized lung apices are clear.  IMPRESSION: No evidence of traumatic injury to the cervical spine.  Moderate multilevel degenerative changes.  Original Report Authenticated By: Charline Bills, M.D.   Mr Cervical Spine Wo Contrast  03/10/2012  *RADIOLOGY REPORT*  Clinical Data: Fall.  Hyperextension injury.  Hand and feet pain.  MRI CERVICAL SPINE WITHOUT  CONTRAST  Technique:  Multiplanar and multiecho pulse sequences of the cervical spine, to include the craniocervical junction and cervicothoracic junction, were obtained according to standard protocol without intravenous contrast.  Comparison: CT cervical spine 03/10/2012.  Findings: Intraspinous edema C3-C7 suggesting soft tissue injury. Additionally, there is slight increased signal within the anterior aspect of the C6-7 disc space and injury at this level is not excluded.  Multilevel cervical spondylotic changes most prominent C4-5 level as detailed below.  Within the compressed C4-5 cord, there is increased signal suggestive of edema and / or gliosis.  This does not appear hemorrhagic on gradient sequence.  This may contribute to the patient's findings.  Both vertebral arteries are patent.  C2-3:  No significant spinal stenosis or foraminal narrowing.  C3-4:  Broad-based disc osteophyte.  Spinal stenosis with mild cord contact/flattening.  Uncinate bony overgrowth with moderate foraminal narrowing.  C4-5:  Prominent broad-based disc osteophyte complex.  Cord compression.  Increased signal within the compressed cord as discussed above.  Uncinate hypertrophy.  Marked bilateral foraminal narrowing greater on the right.  C5-6:  Broad-based disc osteophyte complex slightly greater to the right.  Minimal cord contact.  Minimal foraminal narrowing.  C6-7:  Broad-based disc osteophyte slightly more notable to the left.  No cord compression.  Uncinate hypertrophy greater on the left.  Marked left foraminal narrowing.  Minimal right foraminal narrowing.  C7-T1:  Disc degeneration.  Broad-based disc osteophyte complex. Mild spinal stenosis.  Uncinate hypertrophy greater on the left. Moderate to marked left-sided  and minimal right-sided foraminal narrowing.  IMPRESSION: Intraspinous edema C3-C7 suggesting soft tissue injury. Additionally, there is slight increased signal within the  anterior aspect of the C6-7 disc space  and injury at this level is not excluded.  Multilevel cervical spondylotic changes most prominent C4-5 level as detailed below.  Within the compressed C4-5 cord, there is increased signal suggestive of edema and / or gliosis.  This does not appear hemorrhagic on gradient sequence.  This may contribute to the patient's findings.  C3-4:  Broad-based disc osteophyte.  Spinal stenosis with mild cord contact/flattening.  Uncinate bony overgrowth with moderate foraminal narrowing.  C4-5:  Prominent broad-based disc osteophyte complex.  Cord compression.  Increased signal within the compressed cord as discussed above.  Uncinate hypertrophy.  Marked bilateral foraminal narrowing greater on the right.  C5-6:  Broad-based disc osteophyte complex slightly greater to the right.  Minimal cord contact.  Minimal foraminal narrowing.  C6-7:  Broad-based disc osteophyte slightly more notable to the left.  No cord compression.  Uncinate hypertrophy greater on the left.  Marked left foraminal narrowing.  Minimal right foraminal narrowing.  C7-T1:  Disc degeneration.  Broad-based disc osteophyte complex. Mild spinal stenosis.  Uncinate hypertrophy greater on the left. Moderate to marked left-sided  and minimal right-sided foraminal narrowing.  Critical Value/emergent results were called by telephone at the time of interpretation on 03/10/2012 at 7:58 p.m. to [facility] physician's assistant., who verbally acknowledged these results.  Original Report Authenticated By: Fuller Canada, M.D.   Ct Maxillofacial Wo Cm  03/10/2012  *RADIOLOGY REPORT*  Clinical Data:  Fall, head injury, nose laceration, history of seizures  CT HEAD WITHOUT CONTRAST CT MAXILLOFACIAL WITHOUT CONTRAST CT CERVICAL SPINE WITHOUT CONTRAST  Technique:  Multidetector CT imaging of the head, cervical spine, and maxillofacial structures were performed using the standard protocol without intravenous contrast. Multiplanar CT image reconstructions of the cervical spine and  maxillofacial structures were also generated.  Comparison:  None.  CT HEAD  Findings: No evidence of parenchymal hemorrhage or extra-axial fluid collection. No mass lesion, mass effect, or midline shift.  No CT evidence of acute infarction.  Subcortical white matter and periventricular small vessel ischemic changes.  Cerebral volume is age appropriate.  No ventriculomegaly.  The visualized paranasal sinuses are essentially clear. The mastoid air cells are unopacified.  No evidence of calvarial fracture.  IMPRESSION: No evidence of acute intracranial abnormality.  Small vessel ischemic changes.  See below for maxillofacial findings.  CT MAXILLOFACIAL  Findings:  Comminuted, depressed bilateral nasal bone fractures. Additional fractures of the anterior/mid nasal septum, which bows to the right.  Overlying soft tissue swelling/laceration of the nasal bridge.  Small amount of layering fluid/hemorrhage in the left sphenoid sinus (series 4/image 59).  The visualized paranasal sinuses are otherwise clear.  The mastoid air cells are unopacified.  The bilateral orbits, including the retroconal soft tissues, are within normal limits.  Small bilateral cervical lymph nodes.  6 mm intraparotid lymph node on the left (series 3/image 38).  IMPRESSION: Comminuted, depressed bilateral nasal bone fractures.  Additional fractures of the anterior/mid nasal septum, which bows to the right.  Overlying soft tissue swelling/laceration of the nasal bridge.  CT CERVICAL SPINE  Findings:   Straightening of the cervical spine, positive positional.  No evidence of fracture or dislocation.  Vertebral body heights are maintained.  The dens appears intact.  No prevertebral soft tissue swelling.  Moderate multilevel degenerative changes.  Visualized thyroid is unremarkable.  Visualized lung apices are clear.  IMPRESSION: No evidence of traumatic injury to the cervical spine.  Moderate multilevel degenerative changes.  Original Report Authenticated  By: Charline Bills, M.D.    Assessment/Plan: Without change.  LOS: 2 days  ACDF C4-5 planned for tomorrow am. Pt aware.    Georgiann Cocker 03/12/2012, 8:06 AM

## 2012-03-12 NOTE — Progress Notes (Signed)
As above.

## 2012-03-13 ENCOUNTER — Inpatient Hospital Stay (HOSPITAL_COMMUNITY): Payer: Federal, State, Local not specified - PPO

## 2012-03-13 ENCOUNTER — Encounter (HOSPITAL_COMMUNITY): Payer: Self-pay | Admitting: Plastic Surgery

## 2012-03-13 ENCOUNTER — Encounter (HOSPITAL_COMMUNITY): Payer: Self-pay | Admitting: Anesthesiology

## 2012-03-13 ENCOUNTER — Inpatient Hospital Stay (HOSPITAL_COMMUNITY): Payer: Federal, State, Local not specified - PPO | Admitting: Anesthesiology

## 2012-03-13 ENCOUNTER — Encounter (HOSPITAL_COMMUNITY)
Admission: EM | Disposition: A | Discharge status: Rehabilitation Facility | Payer: Self-pay | Source: Home / Self Care | Attending: Neurosurgery

## 2012-03-13 HISTORY — PX: CLOSED REDUCTION NASAL FRACTURE: SHX5365

## 2012-03-13 HISTORY — PX: ANTERIOR CERVICAL DECOMP/DISCECTOMY FUSION: SHX1161

## 2012-03-13 SURGERY — ANTERIOR CERVICAL DECOMPRESSION/DISCECTOMY FUSION 1 LEVEL
Anesthesia: General | Site: Spine Cervical | Wound class: Clean

## 2012-03-13 MED ORDER — EPHEDRINE SULFATE 50 MG/ML IJ SOLN
INTRAMUSCULAR | Status: DC | PRN
  Administered 2012-03-13 (×2): 5 mg via INTRAVENOUS

## 2012-03-13 MED ORDER — VECURONIUM BROMIDE 10 MG IV SOLR
INTRAVENOUS | Status: DC | PRN
  Administered 2012-03-13: 7 mg via INTRAVENOUS

## 2012-03-13 MED ORDER — BACITRACIN 50000 UNITS IM SOLR
INTRAMUSCULAR | Status: AC
  Filled 2012-03-13: qty 1

## 2012-03-13 MED ORDER — THROMBIN 5000 UNITS EX SOLR
CUTANEOUS | Status: DC | PRN
  Administered 2012-03-13 (×2): 5000 [IU] via TOPICAL

## 2012-03-13 MED ORDER — NEOSTIGMINE METHYLSULFATE 1 MG/ML IJ SOLN
INTRAMUSCULAR | Status: DC | PRN
  Administered 2012-03-13: 4 mg via INTRAVENOUS

## 2012-03-13 MED ORDER — BACITRACIN ZINC 500 UNIT/GM EX OINT
TOPICAL_OINTMENT | CUTANEOUS | Status: DC | PRN
  Administered 2012-03-13: 1 via TOPICAL

## 2012-03-13 MED ORDER — ONDANSETRON HCL 4 MG/2ML IJ SOLN: 4.0000 mg | Freq: Once | INTRAMUSCULAR | Status: DC | PRN

## 2012-03-13 MED ORDER — SUCCINYLCHOLINE CHLORIDE 20 MG/ML IJ SOLN
INTRAMUSCULAR | Status: DC | PRN
  Administered 2012-03-13: 100 mg via INTRAVENOUS

## 2012-03-13 MED ORDER — PANTOPRAZOLE SODIUM 40 MG IV SOLR
40.0000 mg | Freq: Every day | INTRAVENOUS | Status: DC
  Administered 2012-03-13 – 2012-03-14 (×2): 40 mg via INTRAVENOUS
  Filled 2012-03-13 (×4): qty 40

## 2012-03-13 MED ORDER — HYDROCODONE-ACETAMINOPHEN 5-325 MG PO TABS
1.0000 | ORAL_TABLET | ORAL | Status: DC | PRN
  Administered 2012-03-13 (×2): 1 via ORAL
  Administered 2012-03-15 (×2): 2 via ORAL
  Filled 2012-03-13: qty 2
  Filled 2012-03-13 (×2): qty 1
  Filled 2012-03-13: qty 2

## 2012-03-13 MED ORDER — PHENOL 1.4 % MT LIQD: 1.0000 | OROMUCOSAL | Status: DC | PRN

## 2012-03-13 MED ORDER — CEFAZOLIN SODIUM 1-5 GM-% IV SOLN
1.0000 g | Freq: Three times a day (TID) | INTRAVENOUS | Status: AC
  Administered 2012-03-13 (×2): 1 g via INTRAVENOUS
  Filled 2012-03-13 (×2): qty 50

## 2012-03-13 MED ORDER — MORPHINE SULFATE 2 MG/ML IJ SOLN
1.0000 mg | INTRAMUSCULAR | Status: DC | PRN
  Administered 2012-03-13: 2 mg via INTRAVENOUS
  Administered 2012-03-13 (×2): 4 mg via INTRAVENOUS
  Administered 2012-03-14 – 2012-03-15 (×8): 2 mg via INTRAVENOUS
  Filled 2012-03-13: qty 2
  Filled 2012-03-13 (×3): qty 1
  Filled 2012-03-13: qty 2
  Filled 2012-03-13 (×6): qty 1
  Filled 2012-03-13: qty 2
  Filled 2012-03-13: qty 1

## 2012-03-13 MED ORDER — FENTANYL CITRATE 0.05 MG/ML IJ SOLN
INTRAMUSCULAR | Status: DC | PRN
  Administered 2012-03-13 (×2): 50 ug via INTRAVENOUS
  Administered 2012-03-13: 150 ug via INTRAVENOUS
  Administered 2012-03-13: 50 ug via INTRAVENOUS
  Administered 2012-03-13: 100 ug via INTRAVENOUS

## 2012-03-13 MED ORDER — ACETAMINOPHEN 650 MG RE SUPP: 650.0000 mg | RECTAL | Status: DC | PRN

## 2012-03-13 MED ORDER — CEFAZOLIN SODIUM-DEXTROSE 2-3 GM-% IV SOLR
INTRAVENOUS | Status: AC
  Filled 2012-03-13: qty 50

## 2012-03-13 MED ORDER — BUPIVACAINE HCL (PF) 0.5 % IJ SOLN
INTRAMUSCULAR | Status: DC | PRN
  Administered 2012-03-13: 10 mL

## 2012-03-13 MED ORDER — SODIUM CHLORIDE 0.9 % IJ SOLN
3.0000 mL | Freq: Two times a day (BID) | INTRAMUSCULAR | Status: DC
  Administered 2012-03-13: 3 mL via INTRAVENOUS

## 2012-03-13 MED ORDER — LABETALOL HCL 5 MG/ML IV SOLN
5.0000 mg | INTRAVENOUS | Status: AC | PRN
  Administered 2012-03-14: 5 mg via INTRAVENOUS
  Filled 2012-03-13: qty 4

## 2012-03-13 MED ORDER — OXYCODONE-ACETAMINOPHEN 5-325 MG PO TABS
1.0000 | ORAL_TABLET | ORAL | Status: DC | PRN
  Administered 2012-03-13 – 2012-03-15 (×6): 2 via ORAL
  Filled 2012-03-13 (×6): qty 2

## 2012-03-13 MED ORDER — HYDROMORPHONE HCL PF 1 MG/ML IJ SOLN
0.2500 mg | INTRAMUSCULAR | Status: DC | PRN
  Administered 2012-03-13: 0.5 mg via INTRAVENOUS

## 2012-03-13 MED ORDER — GLYCOPYRROLATE 0.2 MG/ML IJ SOLN
INTRAMUSCULAR | Status: DC | PRN
  Administered 2012-03-13: .4 mg via INTRAVENOUS
  Administered 2012-03-13: .7 mg via INTRAVENOUS

## 2012-03-13 MED ORDER — LIDOCAINE HCL 4 % MT SOLN
OROMUCOSAL | Status: DC | PRN
  Administered 2012-03-13: 4 mL via TOPICAL

## 2012-03-13 MED ORDER — CEFAZOLIN SODIUM 1-5 GM-% IV SOLN
INTRAVENOUS | Status: AC
  Filled 2012-03-13: qty 50

## 2012-03-13 MED ORDER — LACTATED RINGERS IV SOLN
INTRAVENOUS | Status: DC | PRN
  Administered 2012-03-13 (×2): via INTRAVENOUS

## 2012-03-13 MED ORDER — DEXAMETHASONE SODIUM PHOSPHATE 4 MG/ML IJ SOLN
4.0000 mg | Freq: Four times a day (QID) | INTRAMUSCULAR | Status: DC
  Administered 2012-03-13 (×2): 4 mg via INTRAVENOUS
  Filled 2012-03-13 (×4): qty 1

## 2012-03-13 MED ORDER — ACETAMINOPHEN 325 MG PO TABS: 650.0000 mg | ORAL_TABLET | ORAL | Status: DC | PRN

## 2012-03-13 MED ORDER — DOCUSATE SODIUM 100 MG PO CAPS
100.0000 mg | ORAL_CAPSULE | Freq: Two times a day (BID) | ORAL | Status: DC
  Administered 2012-03-13 – 2012-03-15 (×4): 100 mg via ORAL
  Filled 2012-03-13 (×6): qty 1

## 2012-03-13 MED ORDER — HEMOSTATIC AGENTS (NO CHARGE) OPTIME
TOPICAL | Status: DC | PRN
  Administered 2012-03-13: 1 via TOPICAL

## 2012-03-13 MED ORDER — MENTHOL 3 MG MT LOZG: 1.0000 | LOZENGE | OROMUCOSAL | Status: DC | PRN

## 2012-03-13 MED ORDER — SODIUM CHLORIDE 0.9 % IV SOLN
INTRAVENOUS | Status: AC
  Filled 2012-03-13: qty 500

## 2012-03-13 MED ORDER — PROPOFOL 10 MG/ML IV EMUL
INTRAVENOUS | Status: DC | PRN
  Administered 2012-03-13: 200 mg via INTRAVENOUS

## 2012-03-13 MED ORDER — ONDANSETRON HCL 4 MG/2ML IJ SOLN: 4.0000 mg | INTRAMUSCULAR | Status: DC | PRN

## 2012-03-13 MED ORDER — DEXAMETHASONE 4 MG PO TABS
4.0000 mg | ORAL_TABLET | Freq: Four times a day (QID) | ORAL | Status: DC
  Administered 2012-03-13 – 2012-03-14 (×2): 4 mg via ORAL
  Filled 2012-03-13 (×5): qty 1

## 2012-03-13 MED ORDER — SODIUM CHLORIDE 0.9 % IR SOLN
Status: DC | PRN
  Administered 2012-03-13: 10:00:00

## 2012-03-13 MED ORDER — ONDANSETRON HCL 4 MG/2ML IJ SOLN
INTRAMUSCULAR | Status: DC | PRN
  Administered 2012-03-13: 4 mg via INTRAVENOUS

## 2012-03-13 MED ORDER — SODIUM CHLORIDE 0.9 % IV SOLN: 250.0000 mL | INTRAVENOUS | Status: DC

## 2012-03-13 MED ORDER — LIDOCAINE HCL (CARDIAC) 20 MG/ML IV SOLN
INTRAVENOUS | Status: DC | PRN
  Administered 2012-03-13: 80 mg via INTRAVENOUS

## 2012-03-13 MED ORDER — SODIUM CHLORIDE 0.9 % IJ SOLN: 3.0000 mL | INTRAMUSCULAR | Status: DC | PRN

## 2012-03-13 MED ORDER — HYDROMORPHONE HCL PF 1 MG/ML IJ SOLN
INTRAMUSCULAR | Status: AC
  Filled 2012-03-13: qty 1

## 2012-03-13 MED ORDER — ROCURONIUM BROMIDE 100 MG/10ML IV SOLN
INTRAVENOUS | Status: DC | PRN
  Administered 2012-03-13: 30 mg via INTRAVENOUS
  Administered 2012-03-13: 20 mg via INTRAVENOUS

## 2012-03-13 MED ORDER — LIDOCAINE-EPINEPHRINE 1 %-1:100000 IJ SOLN
INTRAMUSCULAR | Status: DC | PRN
  Administered 2012-03-13: 10 mL
  Administered 2012-03-13: 4 mL

## 2012-03-13 MED ORDER — OXYMETAZOLINE HCL 0.05 % NA SOLN
NASAL | Status: DC | PRN
  Administered 2012-03-13: 1 via NASAL

## 2012-03-13 MED ORDER — DEXAMETHASONE SODIUM PHOSPHATE 4 MG/ML IJ SOLN
INTRAMUSCULAR | Status: DC | PRN
  Administered 2012-03-13: 10 mg via INTRAVENOUS

## 2012-03-13 MED ORDER — DIAZEPAM 5 MG PO TABS
5.0000 mg | ORAL_TABLET | Freq: Four times a day (QID) | ORAL | Status: DC | PRN
  Administered 2012-03-13 – 2012-03-15 (×5): 5 mg via ORAL
  Filled 2012-03-13 (×5): qty 1

## 2012-03-13 MED ORDER — KCL IN DEXTROSE-NACL 20-5-0.45 MEQ/L-%-% IV SOLN
INTRAVENOUS | Status: DC
  Administered 2012-03-13 – 2012-03-15 (×2): via INTRAVENOUS
  Filled 2012-03-13 (×8): qty 1000

## 2012-03-13 MED ORDER — DEXTROSE 5 % IV SOLN
3.0000 g | INTRAVENOUS | Status: DC | PRN
  Administered 2012-03-13: 3 g via INTRAVENOUS

## 2012-03-13 MED ORDER — ZOLPIDEM TARTRATE 5 MG PO TABS
5.0000 mg | ORAL_TABLET | Freq: Every evening | ORAL | Status: DC | PRN
  Administered 2012-03-13 – 2012-03-14 (×2): 5 mg via ORAL
  Filled 2012-03-13 (×2): qty 1

## 2012-03-13 MED ORDER — 0.9 % SODIUM CHLORIDE (POUR BTL) OPTIME
TOPICAL | Status: DC | PRN
  Administered 2012-03-13 (×2): 1000 mL

## 2012-03-13 SURGICAL SUPPLY — 85 items
BAG DECANTER FOR FLEXI CONT (MISCELLANEOUS) ×3 IMPLANT
BANDAGE GAUZE ELAST BULKY 4 IN (GAUZE/BANDAGES/DRESSINGS) IMPLANT
BENZOIN TINCTURE PRP APPL 2/3 (GAUZE/BANDAGES/DRESSINGS) IMPLANT
BIT DRILL 14MM (INSTRUMENTS) ×2 IMPLANT
BIT DRILL NEURO 2X3.1 SFT TUCH (MISCELLANEOUS) ×2 IMPLANT
BLADE ULTRA TIP 2M (BLADE) IMPLANT
BUR BARREL STRAIGHT FLUTE 4.0 (BURR) ×3 IMPLANT
CAGE CERVICAL 8 (Cage) ×1 IMPLANT
CANISTER SUCTION 2500CC (MISCELLANEOUS) ×6 IMPLANT
CLOTH BEACON ORANGE TIMEOUT ST (SAFETY) ×6 IMPLANT
CONT SPEC 4OZ CLIKSEAL STRL BL (MISCELLANEOUS) ×3 IMPLANT
COVER MAYO STAND STRL (DRAPES) ×6 IMPLANT
COVER TABLE BACK 60X90 (DRAPES) ×3 IMPLANT
DENVER SPLINT ×3 IMPLANT
DERMABOND ADVANCED (GAUZE/BANDAGES/DRESSINGS) ×1
DERMABOND ADVANCED .7 DNX12 (GAUZE/BANDAGES/DRESSINGS) ×2 IMPLANT
DRAPE LAPAROTOMY 100X72 PEDS (DRAPES) ×3 IMPLANT
DRAPE MICROSCOPE LEICA (MISCELLANEOUS) ×3 IMPLANT
DRAPE POUCH INSTRU U-SHP 10X18 (DRAPES) ×3 IMPLANT
DRAPE PROXIMA HALF (DRAPES) IMPLANT
DRESSING TELFA 8X3 (GAUZE/BANDAGES/DRESSINGS) IMPLANT
DRILL 14MM (INSTRUMENTS) ×3
DRILL NEURO 2X3.1 SOFT TOUCH (MISCELLANEOUS) ×3
DURAPREP 6ML APPLICATOR 50/CS (WOUND CARE) ×3 IMPLANT
ELECT COATED BLADE 2.86 ST (ELECTRODE) ×3 IMPLANT
ELECT REM PT RETURN 9FT ADLT (ELECTROSURGICAL) ×3
ELECTRODE REM PT RTRN 9FT ADLT (ELECTROSURGICAL) ×2 IMPLANT
GAUZE SPONGE 2X2 8PLY STRL LF (GAUZE/BANDAGES/DRESSINGS) ×2 IMPLANT
GAUZE SPONGE 4X4 16PLY XRAY LF (GAUZE/BANDAGES/DRESSINGS) ×3 IMPLANT
GLOVE BIO SURGEON STRL SZ 6.5 (GLOVE) ×3 IMPLANT
GLOVE BIO SURGEON STRL SZ8 (GLOVE) ×3 IMPLANT
GLOVE BIOGEL PI IND STRL 7.0 (GLOVE) ×4 IMPLANT
GLOVE BIOGEL PI IND STRL 8 (GLOVE) ×4 IMPLANT
GLOVE BIOGEL PI IND STRL 8.5 (GLOVE) ×2 IMPLANT
GLOVE BIOGEL PI INDICATOR 7.0 (GLOVE) ×2
GLOVE BIOGEL PI INDICATOR 8 (GLOVE) ×2
GLOVE BIOGEL PI INDICATOR 8.5 (GLOVE) ×1
GLOVE ECLIPSE 7.5 STRL STRAW (GLOVE) ×3 IMPLANT
GLOVE ECLIPSE 8.0 STRL XLNG CF (GLOVE) ×3 IMPLANT
GLOVE EXAM NITRILE LRG STRL (GLOVE) IMPLANT
GLOVE EXAM NITRILE MD LF STRL (GLOVE) IMPLANT
GLOVE EXAM NITRILE XL STR (GLOVE) IMPLANT
GLOVE EXAM NITRILE XS STR PU (GLOVE) IMPLANT
GLOVE SURG SS PI 6.5 STRL IVOR (GLOVE) ×9 IMPLANT
GLOVE SURG SS PI 8.0 STRL IVOR (GLOVE) ×6 IMPLANT
GOWN BRE IMP SLV AUR LG STRL (GOWN DISPOSABLE) ×9 IMPLANT
GOWN BRE IMP SLV AUR XL STRL (GOWN DISPOSABLE) ×6 IMPLANT
GOWN STRL NON-REIN LRG LVL3 (GOWN DISPOSABLE) ×6 IMPLANT
GOWN STRL REIN 2XL LVL4 (GOWN DISPOSABLE) ×6 IMPLANT
HEAD HALTER (SOFTGOODS) ×3 IMPLANT
KIT BASIN OR (CUSTOM PROCEDURE TRAY) ×6 IMPLANT
KIT ROOM TURNOVER OR (KITS) ×6 IMPLANT
NEEDLE HYPO 18GX1.5 BLUNT FILL (NEEDLE) IMPLANT
NEEDLE HYPO 25GX1X1/2 BEV (NEEDLE) ×3 IMPLANT
NEEDLE HYPO 25X1 1.5 SAFETY (NEEDLE) ×3 IMPLANT
NEEDLE SPNL 22GX3.5 QUINCKE BK (NEEDLE) ×3 IMPLANT
NS IRRIG 1000ML POUR BTL (IV SOLUTION) ×6 IMPLANT
PACK LAMINECTOMY NEURO (CUSTOM PROCEDURE TRAY) ×3 IMPLANT
PAD ARMBOARD 7.5X6 YLW CONV (MISCELLANEOUS) ×15 IMPLANT
PATTIES SURGICAL .5 X3 (DISPOSABLE) ×3 IMPLANT
PIN DISTRACTION 14MM (PIN) ×6 IMPLANT
PLATE 14MM (Plate) ×3 IMPLANT
PROFUSE BONE SIZE 2 (Bone Implant) ×3 IMPLANT
RUBBERBAND STERILE (MISCELLANEOUS) ×6 IMPLANT
SCREW 14MM (Screw) ×3 IMPLANT
SPACER SPNL 7D LRG 16X14X8XNS (Cage) ×2 IMPLANT
SPCR SPNL 7D LRG 16X14X8XNS (Cage) ×2 IMPLANT
SPLINT NASAL DOYLE BI-VL (GAUZE/BANDAGES/DRESSINGS) ×3 IMPLANT
SPLINT NASAL THERMO PLAST (MISCELLANEOUS) IMPLANT
SPONGE GAUZE 2X2 STER 10/PKG (GAUZE/BANDAGES/DRESSINGS) ×1
SPONGE GAUZE 4X4 12PLY (GAUZE/BANDAGES/DRESSINGS) IMPLANT
SPONGE INTESTINAL PEANUT (DISPOSABLE) ×3 IMPLANT
SPONGE SURGIFOAM ABS GEL SZ50 (HEMOSTASIS) ×3 IMPLANT
STAPLER SKIN PROX WIDE 3.9 (STAPLE) IMPLANT
STRIP CLOSURE SKIN 1/2X4 (GAUZE/BANDAGES/DRESSINGS) IMPLANT
SUT ETHILON 3 0 FSL (SUTURE) ×3 IMPLANT
SUT VIC AB 3-0 SH 8-18 (SUTURE) ×6 IMPLANT
SYR 20ML ECCENTRIC (SYRINGE) ×3 IMPLANT
SYR 3ML LL SCALE MARK (SYRINGE) IMPLANT
SYR CONTROL 10ML LL (SYRINGE) ×3 IMPLANT
TOWEL OR 17X24 6PK STRL BLUE (TOWEL DISPOSABLE) ×6 IMPLANT
TOWEL OR 17X26 10 PK STRL BLUE (TOWEL DISPOSABLE) ×3 IMPLANT
TRAP SPECIMEN MUCOUS 40CC (MISCELLANEOUS) ×3 IMPLANT
TUBE CONNECTING 12X1/4 (SUCTIONS) ×3 IMPLANT
WATER STERILE IRR 1000ML POUR (IV SOLUTION) ×6 IMPLANT

## 2012-03-13 NOTE — OR Nursing (Signed)
Restless/ up-down/ side to side with legs coming off bed thru rails as sits up / managed to keep in bed/safe....repositioned in bed with assist or tech [facility]

## 2012-03-13 NOTE — Preoperative (Signed)
Beta Blockers   Reason not to administer Beta Blockers:Not Applicable, given at 1000 03/12/12

## 2012-03-13 NOTE — Anesthesia Postprocedure Evaluation (Signed)
  Anesthesia Post-op Note  Patient: Edward [name]  Procedure(s) Performed: Procedure(s) (LRB): ANTERIOR CERVICAL DECOMPRESSION/DISCECTOMY FUSION 1 LEVEL (N/A) CLOSED REDUCTION NASAL FRACTURE (N/A)  Patient Location: PACU  Anesthesia Type: General  Level of Consciousness: awake, alert , oriented and patient cooperative  Airway and Oxygen Therapy: Patient Spontanous Breathing and Patient connected to nasal cannula oxygen  Post-op Pain: mild  Post-op Assessment: Post-op Vital signs reviewed, Patient's Cardiovascular Status Stable, Respiratory Function Stable, Patent Airway, No signs of Nausea or vomiting and Pain level controlled  Post-op Vital Signs: stable  Complications: No apparent anesthesia complications

## 2012-03-13 NOTE — Brief Op Note (Signed)
03/10/2012 - 03/13/2012  12:24 PM  PATIENT:  [name]  61 y.o. male  PRE-OPERATIVE DIAGNOSIS:  Nasal bone fracture   POST-OPERATIVE DIAGNOSIS:  Nasal bone fracture  PROCEDURE:  Procedure(s) (LRB): CLOSED REDUCTION NASAL FRACTURE (N/A) WITH INTERNAL AND EXTERNAL SPLINTING  SURGEON:  Surgeon(s) and Role:   * Tracy Gerken Sanger, DO - Primary  PHYSICIAN ASSISTANT: None  ASSISTANTS: none   ANESTHESIA:   general  EBL:  Total I/O In: 1275 [I.V.:1275] Out: 260 [Urine:260]  BLOOD ADMINISTERED:none  DRAINS: none   LOCAL MEDICATIONS USED:  LIDOCAINE   SPECIMEN:  No Specimen  DISPOSITION OF SPECIMEN:  N/A  COUNTS:  YES  TOURNIQUET:  * No tourniquets in log *  DICTATION: dictated  PLAN OF CARE: return to the unit  PATIENT DISPOSITION:  PACU - hemodynamically stable.   Delay start of Pharmacological VTE agent (>24hrs) due to surgical blood loss or risk of bleeding: no

## 2012-03-13 NOTE — OR Nursing (Signed)
Restless, asking to sit up then immed thereafter to lie down,  Legs out of bed/ repositionmed, reassured of surroundins and safety

## 2012-03-13 NOTE — Op Note (Signed)
03/10/2012 - 03/13/2012  10:48 AM  PATIENT:  Edward [name]  61 y.o. male  PRE-OPERATIVE DIAGNOSIS:  Spinal Cord Injury with central cord injury C4/5 with HNP, spondylosis, stenosis  POST-OPERATIVE DIAGNOSIS:  Spinal Cord Injury with central cord injury C4/5 with HNP, spondylosis, stenosis  PROCEDURE:  Procedure(s) (LRB): ANTERIOR CERVICAL DECOMPRESSION/DISCECTOMY FUSION 1 LEVEL C4/5 with PEEK cage, autograft and allograft, anterior cervical plate (N/A) CLOSED REDUCTION NASAL FRACTURE (N/A)  SURGEON:  Surgeon(s) and Role: Panel 1:    * Maeola Harman, MD - Primary    * Hewitt Shorts, MD - Assisting  Panel 2:    * Alan Ripper Sanger, DO - Primary  PHYSICIAN ASSISTANT:   ASSISTANTS: [place], RN   ANESTHESIA:   general  EBL:  Total I/O In: 1075 [I.V.:1075] Out: 260 [Urine:260]  BLOOD ADMINISTERED:none  DRAINS: none   LOCAL MEDICATIONS USED:  LIDOCAINE   SPECIMEN:  No Specimen  DISPOSITION OF SPECIMEN:  N/A  COUNTS:  YES  TOURNIQUET:  * No tourniquets in log *  DICTATION:   Patient was brought to operating room and following the smooth and uncomplicated induction of general endotracheal anesthesia, Dr. Kelly Splinter performed reduction and fixation of nasal bone fracture, which she will dictate separately. After the conclusion of her portion of the procedure, patient's head was placed on a horseshoe head holder he was placed in 5 pounds of Holter traction and his anterior neck was prepped and draped in usual sterile fashion. An incision was made on the left side of midline after infiltrating the skin and subcutaneous tissues with local lidocaine. The platysmal layer was incised and subplatysmal dissection was performed exposing the anterior border sternocleidomastoid muscle. Using blunt dissection the carotid sheath was kept lateral and trachea and esophagus kept medial exposing the anterior cervical spine. A bent spinal needle was placed it was felt to be the C4/5 level and this was  confirmed on intraoperative x-ray. Longus coli muscles were taken down from the anterior cervical spine using electrocautery and key elevator and self-retaining retractor was placed. Ventral osteophytes were harvested for later bone grafting. The interspace was incised and a thorough discectomy was performed. Distraction pins were placed. Uncinate spurs and central spondylitic ridges were drilled down with a high-speed drill. The spinal cord dura and both C5 nerve roots were decompressed. The significant spondylotic ridges causing spinal cord compression were removed with resultant decompression of the cervical spinal cord dura. Hemostasis was assured. After trial sizing an 8 mm peek interbody cage was selected and packed with profuse block and autograft. Tamped into position and countersunk appropriately. Distraction weight was removed. A 14 mm trestle luxe anterior cervical plate was affixed to the cervical spine with 14 mm variable-angle screws 2 at C4 and 2 at C5. All screws were well-positioned and locking mechanisms were engaged. Soft tissues were inspected and found to be in good repair. The wound was irrigated. The platysma layer was closed with 3-0 Vicryl stitches and the skin was reapproximated with 3-0 Vicryl subcuticular stitches. The wound was dressed with Dermabond. Counts were correct at the end of the case. Patient was extubated and taken to recovery in stable and satisfactory condition.  PLAN OF CARE: Admit to inpatient   PATIENT DISPOSITION:  PACU - hemodynamically stable.   Delay start of Pharmacological VTE agent (>24hrs) due to surgical blood loss or risk of bleeding: yes

## 2012-03-13 NOTE — Transfer of Care (Signed)
Immediate Anesthesia Transfer of Care Note  Patient: Edward [name]  Procedure(s) Performed: Procedure(s) (LRB): ANTERIOR CERVICAL DECOMPRESSION/DISCECTOMY FUSION 1 LEVEL (N/A) CLOSED REDUCTION NASAL FRACTURE (N/A)  Patient Location: PACU  Anesthesia Type: General  Level of Consciousness: patient cooperative and responds to stimulation  Airway & Oxygen Therapy: Patient Spontanous Breathing and Patient connected to face mask oxygen  Post-op Assessment: Report given to PACU RN, Post -op Vital signs reviewed and stable and Patient moving all extremities X 4  Post vital signs: Reviewed and stable  Complications: No apparent anesthesia complications

## 2012-03-13 NOTE — Progress Notes (Signed)
Pt transported to PACU, report given to CRNA.

## 2012-03-13 NOTE — H&P (Signed)
Edward [name] is an 61 y.o. male.   Chief Complaint: nasal fracture HPI: The patient is a 61 yrs old bm here after a fall.  He hit his face on a bed frame and sustained a nasal fracture.  He is going to have cervical spine surgery and we will repair his nose at the same time.  Past Medical History  Diagnosis Date  . Measles   . Mumps   . Chicken pox   . Whooping cough   . Pneumonia   . Hypertension   . Coronary artery disease     a. 1999 s/p MI with cath/PCI;  b. 05/1999 Ex Cardiolite EF 68%, no ishcemia.  . Dyslipidemia   . Hypokalemia   . History of tobacco abuse   . Near syncope   . Nasal bones, closed fracture     a. 02/2012 in setting of presyncope/fall  . Injury of cervical spine     a. 02/2012 C4/5    Past Surgical History  Procedure Date  . [place] tendon surgery 1989  . Achilles tendon surgery 1989  . Cardiac catheterization 06/28/1998    single vessel CAD involving the distal RCA/PTCA and stenting of the distal RCA//EF- 50-55%  . Nm myoview ltd 05/17/2009    normal stress nuclear study/no evidence of ischemia/EF- 68%    Family History  Problem Relation Age of Onset  . Heart attack Father     36  . Cancer Mother     27   Social History:  reports that he quit smoking about 25 years ago. His smoking use included Cigarettes. He has a 20 pack-year smoking history. He does not have any smokeless tobacco history on file. He reports that he drinks alcohol. He reports that he does not use illicit drugs.  Allergies: No Known Allergies  Medications Prior to Admission  Medication Sig Dispense Refill  . amLODipine (NORVASC) 5 MG tablet Take 5 mg by mouth daily.      Marland Kitchen aspirin 81 MG tablet Take 81 mg by mouth daily.      . fish oil-omega-3 fatty acids 1000 MG capsule Take 3 g by mouth daily. Take three tablets by mouth once daily      . lisinopril-hydrochlorothiazide (PRINZIDE,ZESTORETIC) 20-25 MG per tablet Take 1 tablet by mouth daily.      . metoprolol succinate  (TOPROL-XL) 50 MG 24 hr tablet Take 50 mg by mouth daily. Take with or immediately following a meal.      . Multiple Vitamin (MULTIVITAMIN) tablet Take 1 tablet by mouth daily.      . potassium chloride (K-DUR,KLOR-CON) 10 MEQ tablet Take 10 mEq by mouth daily.      . simvastatin (ZOCOR) 80 MG tablet Take 80 mg by mouth at bedtime.      . nitroGLYCERIN (NITROSTAT) 0.4 MG SL tablet place 1 tablet under the tongue if needed for chest pain  25 tablet  PRN    No results found for this or any previous visit (from the past 48 hour(s)). No results found.  Review of Systems  Constitutional: Negative.   HENT: Negative.   Eyes: Negative.   Respiratory: Negative.   Cardiovascular: Negative.   Gastrointestinal: Negative.   Genitourinary: Negative.   Skin: Negative.     Blood pressure 147/89, pulse 58, temperature 98.2 F (36.8 C), temperature source Oral, resp. rate 8, height 6' (1.829 m), weight 94.348 kg (208 lb), SpO2 98.00%. Physical Exam  Constitutional: He appears well-developed and well-nourished.  HENT:  Head: Normocephalic.  Eyes: Conjunctivae and EOM are normal. Pupils are equal, round, and reactive to light.  Cardiovascular: Normal rate.   Respiratory: Effort normal.  GI: Soft.  Neurological: He is alert.  Skin: Skin is warm.  Psychiatric: He has a normal mood and affect. His behavior is normal. Judgment and thought content normal.     Assessment/Plan Nasal fracture - closed nasal reduction with internal and external splinting.  Consent obtained and risks discussed and included bleeding, pain, scar, risk of anesthesia.  SANGER,Meloney Feld 03/13/2012, 8:09 AM

## 2012-03-13 NOTE — Progress Notes (Signed)
PT Cancellation Note  Treatment cancelled today due to pt has been taken to sx for cord decompression. 03/13/2012  [place] Bing, PT [phone] (pager)   Kristopher Delk, Eliseo Gum 03/13/2012, 9:24 AM

## 2012-03-13 NOTE — Anesthesia Preprocedure Evaluation (Addendum)
Anesthesia Evaluation  Patient identified by MRN, date of birth, ID band Patient awake    Reviewed: Allergy & Precautions, H&P , NPO status , Patient's Chart, lab work & pertinent test results  Airway Mallampati: I TM Distance: >3 FB Neck ROM: full    Dental  (+) Dental Advisory Given and Teeth Intact   Pulmonary former smoker,          Cardiovascular hypertension, + CAD and + Cardiac Stents Rhythm:regular Rate:Normal     Neuro/Psych    GI/Hepatic   Endo/Other    Renal/GU      Musculoskeletal   Abdominal   Peds  Hematology   Anesthesia Other Findings   Reproductive/Obstetrics                          Anesthesia Physical Anesthesia Plan  ASA: III  Anesthesia Plan: General   Post-op Pain Management:    Induction: Intravenous  Airway Management Planned: Oral ETT  Additional Equipment:   Intra-op Plan:   Post-operative Plan: Extubation in OR  Informed Consent: I have reviewed the patients History and Physical, chart, labs and discussed the procedure including the risks, benefits and alternatives for the proposed anesthesia with the patient or authorized representative who has indicated his/her understanding and acceptance.     Plan Discussed with: CRNA, Anesthesiologist and Surgeon  Anesthesia Plan Comments:         Anesthesia Quick Evaluation

## 2012-03-14 ENCOUNTER — Encounter (HOSPITAL_COMMUNITY): Payer: Self-pay | Admitting: Neurosurgery

## 2012-03-14 DIAGNOSIS — G959 Disease of spinal cord, unspecified: Secondary | ICD-10-CM

## 2012-03-14 MED ORDER — GABAPENTIN 100 MG PO CAPS
100.0000 mg | ORAL_CAPSULE | Freq: Three times a day (TID) | ORAL | Status: DC
  Administered 2012-03-14 – 2012-03-15 (×4): 100 mg via ORAL
  Filled 2012-03-14 (×6): qty 1

## 2012-03-14 NOTE — Progress Notes (Signed)
Physical Therapy Treatment Patient Details Name: Edward [name] MRN: [id number] DOB: 1950/08/21 Today's Date: 03/14/2012 Time: 4098-1191 PT Time Calculation (min): 16 min  PT Assessment / Plan / Recommendation Comments on Treatment Session  Mobility, specifically due to decr R>L knee/LE control has declined minutely.  Pt still has lots of potential. Great rehab candidate.    Follow Up Recommendations  Inpatient Rehab    Barriers to Discharge        Equipment Recommendations  Defer to next venue    Recommendations for Other Services Rehab consult  Frequency Min 3X/week   Plan Discharge plan remains appropriate    Precautions / Restrictions Precautions Precautions: Cervical Required Braces or Orthoses: Cervical Brace Cervical Brace: Hard collar;Applied in supine position Restrictions Weight Bearing Restrictions: No   Pertinent Vitals/Pain     Mobility  Bed Mobility Bed Mobility: Not assessed Transfers Transfers: Sit to Stand;Stand to Sit Sit to Stand: 1: +2 Total assist;Without upper extremity assist;From chair/3-in-1 Sit to Stand: Patient Percentage: 50% (to 60%) Stand to Sit: 1: +2 Total assist;Without upper extremity assist;To chair/3-in-1 Stand to Sit: Patient Percentage: 50% Details for Transfer Assistance: vc/tc's for technique and safety Ambulation/Gait Stairs: No    Exercises     PT Diagnosis:    PT Problem List:   PT Treatment Interventions:     PT Goals Acute Rehab PT Goals PT Goal: Sit to Stand - Progress: Progressing toward goal PT Transfer Goal: Bed to Chair/Chair to Bed - Progress: Progressing toward goal  Visit Information  Last PT Received On: 03/14/12 Assistance Needed: +2    Subjective Data  Subjective: my hands are still hurting   Cognition  Overall Cognitive Status: Appears within functional limits for tasks assessed/performed Arousal/Alertness: Awake/alert Orientation Level: Appears intact for tasks assessed Behavior During  Session: [facility] for tasks performed    Balance  Static Sitting Balance Static Sitting - Balance Support: No upper extremity supported Static Sitting - Level of Assistance: 5: Stand by assistance Static Standing Balance Static Standing - Balance Support: Bilateral upper extremity supported Static Standing - Level of Assistance: 1: +2 Total assist;Patient percentage (comment);Other (comment) (pt =50%) Static Standing - Comment/# of Minutes: 12 minuest working on knee control and w/shifting  End of Session PT - End of Session Activity Tolerance: Patient tolerated treatment well Patient left: in chair;with call bell/phone within reach Nurse Communication: Mobility status   GP     Allicia Culley, Eliseo Gum 03/14/2012, 11:02 AM  03/14/2012  [place] Bing, PT [phone] (pager)

## 2012-03-14 NOTE — Progress Notes (Signed)
Clinical Social Worker received referral for new snf placement. Clinical Social Worker reviewed chart and spoke with inpatient rehab RN, Ottie Glazier. Pt has been recommended and accepted to inpatient rehab and per Ottie Glazier plan is to sent PT/OT notes to pt insurance for authorization for rehab stay. Per Ottie Glazier, inpatient rehab should not have difficulty gaining approval from pt insurance therefore no need to pursue SNF as secondary option. No further social work needs identified at this time. Please re-consult if needs arise. Clinical Social Worker signing off.   Jacklynn Lewis, MSW, LCSWA  Clinical Social Work [phone]

## 2012-03-14 NOTE — Progress Notes (Addendum)
I await PT and OT post op so that I can get insurance authorization with [employer] to admit to inpt [phone] I await insurance approval to admit patient to inpt rehab hopefully tomorrow.

## 2012-03-14 NOTE — Progress Notes (Signed)
Occupational Therapy Treatment Patient Details Name: Edward [name] MRN: [id number] DOB: 23-Feb-1951 Today's Date: 03/14/2012 Time: 8295-6213 OT Time Calculation (min): 16 min  OT Assessment / Plan / Recommendation Comments on Treatment Session Pt progressing well this session s/p ACDF. Pt with decreased coordination and motor planning. Recommend using BIL UE plateform RW next session to allow more facilitation of neutral trunk tacitle cues.    Follow Up Recommendations  Inpatient Rehab    Barriers to Discharge       Equipment Recommendations  Defer to next venue    Recommendations for Other Services Rehab consult  Frequency Min 3X/week   Plan Discharge plan remains appropriate    Precautions / Restrictions Precautions Precautions: Cervical Required Braces or Orthoses: Cervical Brace Cervical Brace: Hard collar;Applied in supine position Restrictions Weight Bearing Restrictions: No   Pertinent Vitals/Pain 8 out 10 hands    ADL  Upper Body Dressing: Performed;Maximal assistance Where Assessed - Upper Body Dressing: Supported sitting (don new gown) Toilet Transfer: Chief of Staff: Patient Percentage: 50% Statistician Method: Sit to Barista: Raised toilet seat with arms (or 3-in-1 over toilet) ADL Comments: Pt positioned in chair on arrival. Pt with rotation of trunk to the Rt side. Pt sit<>stand with hand over hand to allow pt to push up with BIL hands for input. Pt in static standing with Rt LE hyperextended and trunk rotated at hips toward rt side. Pt with facilitation of NDT for neutral trunk and blocking RT LE to allow pt to bend Rt LE to prevent hyperextension. Pt with anterior lean and LOB. Pt demonstrates ataxic movement      OT Goals Acute Rehab OT Goals OT Goal Formulation: With patient Time For Goal Achievement: 03/25/12 Potential to Achieve Goals: Good ADL Goals Pt Will Perform Grooming: with min  assist;Sitting, chair;Supported Pt Will Perform Upper Body Bathing: with mod assist;Sitting, chair;Supported Pt Will Perform Upper Body Dressing: with mod assist;Sitting, chair;Supported;with cueing (comment type and amount) ADL Goal: Upper Body Dressing - Progress: Progressing toward goals Pt Will Transfer to Toilet: with mod assist;Stand pivot transfer;3-in-1 ADL Goal: Toilet Transfer - Progress: Progressing toward goals Pt Will Perform Toileting - Clothing Manipulation: with max assist;Sitting on 3-in-1 or toilet Miscellaneous OT Goals Miscellaneous OT Goal #1: Pt will perform UE HEP x 10 reps (shoulder flexion, supination/pronation, elbow flexion / extension)  Visit Information  Last OT Received On: 03/14/12 Assistance Needed: +2 PT/OT Co-Evaluation/Treatment: Yes               Cognition  Overall Cognitive Status: Appears within functional limits for tasks assessed/performed Arousal/Alertness: Awake/alert Orientation Level: Appears intact for tasks assessed Behavior During Session: [facility] for tasks performed    Mobility Transfers Sit to Stand: 1: +2 Total assist;Without upper extremity assist;From chair/3-in-1 Sit to Stand: Patient Percentage: 50% Stand to Sit: 1: +2 Total assist;To chair/3-in-1;With upper extremity assist Stand to Sit: Patient Percentage: 50% Details for Transfer Assistance: Pt required tactile cues and hand over hand for hand placement. Pt required facilitation for upright posture. Pt with widen base of support and required facilitaiton of weight shifting to adjust BIL LE. Pt unsafe to ambulate this session   Exercises    Balance Static Standing Balance Static Standing - Balance Support: Bilateral upper extremity supported Static Standing - Level of Assistance: 1: +2 Total assist;Patient percentage (comment);Other (comment) (50%) Static Standing - Comment/# of Minutes: 12  End of Session OT - End of Session Activity Tolerance: Patient tolerated treatment  well Patient left: in chair;with call bell/phone within reach Nurse Communication: Mobility status  GO     Excellent rehab potential- RECOMMEND CIR  Lucile Shutters 03/14/2012, 10:59 AM Pager: [phone]

## 2012-03-14 NOTE — Progress Notes (Signed)
Subjective: Patient reports doing well  Objective: Vital signs in last 24 hours: Temp:  [97.4 F (36.3 C)-98.1 F (36.7 C)] 98.1 F (36.7 C) (08/14 2000) Pulse Rate:  [61-92] 68  (08/15 0800) Resp:  [7-20] 12  (08/15 0800) BP: (137-198)/(60-135) 156/60 mmHg (08/15 0800) SpO2:  [95 %-100 %] 98 % (08/15 0800)  Intake/Output from previous day: 08/14 0701 - 08/15 0700 In: 2250 [I.V.:2250] Out: 1365 [Urine:1365] Intake/Output this shift: Total I/O In: 75 [I.V.:75] Out: -   Physical Exam: Full Bilateral D/B/LE's.  Persistent weakness in both triceps and hand intrinsics.  A bit better in terms of strength today from preop.  Complains of burning dysesthesias in right> left hand.  Dressing C/DI.  Lab Results: No results found for this basename: WBC:2,HGB:2,HCT:2,PLT:2 in the last 72 hours BMET No results found for this basename: NA:2,K:2,CL:2,CO2:2,GLUCOSE:2,BUN:2,CREATININE:2,CALCIUM:2 in the last 72 hours  Studies/Results: Dg Cervical Spine 2-3 Views  03/13/2012  *RADIOLOGY REPORT*  Clinical Data: ACDF C4-5.  OPERATIVE CERVICAL SPINE - 2-3 VIEW  Comparison: Cervical spine MRI 03/10/2012.  CT cervical spine 03/10/2012.  Findings: Images were submitted for interpretation post- operatively.  Initial image at 0955 hours demonstrates the localizer needle projected over the anterior C4-5 disc space. Second image at 1030 hours demonstrates ACDF with hardware and interbody fusion with plugs at C4-5.  Anatomic alignment through C5.  Fusion plugs appropriately positioned in the disc space.  IMPRESSION: ACDF C4-5.  Original Report Authenticated By: Arnell Sieving, M.D.    Assessment/Plan: Transfer to floor or Rehab today.    LOS: 4 days    Dorian Heckle, MD 03/14/2012, 8:56 AM

## 2012-03-15 ENCOUNTER — Encounter (HOSPITAL_COMMUNITY): Payer: Self-pay

## 2012-03-15 ENCOUNTER — Inpatient Hospital Stay (HOSPITAL_COMMUNITY)
Admission: RE | Admit: 2012-03-15 | Discharge: 2012-04-03 | DRG: 462 | Disposition: A | Discharge status: Home / Self Care | Payer: Federal, State, Local not specified - PPO | Source: Ambulatory Visit | Attending: Physical Medicine & Rehabilitation | Admitting: Physical Medicine & Rehabilitation

## 2012-03-15 DIAGNOSIS — Z9889 Other specified postprocedural states: Secondary | ICD-10-CM

## 2012-03-15 DIAGNOSIS — S14125A Central cord syndrome at C5 level of cervical spinal cord, initial encounter: Secondary | ICD-10-CM

## 2012-03-15 DIAGNOSIS — Z79899 Other long term (current) drug therapy: Secondary | ICD-10-CM

## 2012-03-15 DIAGNOSIS — S14121A Central cord syndrome at C1 level of cervical spinal cord, initial encounter: Secondary | ICD-10-CM

## 2012-03-15 DIAGNOSIS — E785 Hyperlipidemia, unspecified: Secondary | ICD-10-CM

## 2012-03-15 DIAGNOSIS — G959 Disease of spinal cord, unspecified: Secondary | ICD-10-CM

## 2012-03-15 DIAGNOSIS — I10 Essential (primary) hypertension: Secondary | ICD-10-CM

## 2012-03-15 DIAGNOSIS — Z5189 Encounter for other specified aftercare: Principal | ICD-10-CM

## 2012-03-15 DIAGNOSIS — Z87891 Personal history of nicotine dependence: Secondary | ICD-10-CM

## 2012-03-15 DIAGNOSIS — I2510 Atherosclerotic heart disease of native coronary artery without angina pectoris: Secondary | ICD-10-CM

## 2012-03-15 DIAGNOSIS — I959 Hypotension, unspecified: Secondary | ICD-10-CM

## 2012-03-15 DIAGNOSIS — IMO0002 Reserved for concepts with insufficient information to code with codable children: Secondary | ICD-10-CM

## 2012-03-15 DIAGNOSIS — K5900 Constipation, unspecified: Secondary | ICD-10-CM

## 2012-03-15 DIAGNOSIS — S022XXA Fracture of nasal bones, initial encounter for closed fracture: Secondary | ICD-10-CM | POA: Diagnosis present

## 2012-03-15 DIAGNOSIS — G8250 Quadriplegia, unspecified: Secondary | ICD-10-CM

## 2012-03-15 DIAGNOSIS — W19XXXA Unspecified fall, initial encounter: Secondary | ICD-10-CM

## 2012-03-15 DIAGNOSIS — R209 Unspecified disturbances of skin sensation: Secondary | ICD-10-CM

## 2012-03-15 DIAGNOSIS — M47812 Spondylosis without myelopathy or radiculopathy, cervical region: Secondary | ICD-10-CM

## 2012-03-15 DIAGNOSIS — Z981 Arthrodesis status: Secondary | ICD-10-CM

## 2012-03-15 DIAGNOSIS — I252 Old myocardial infarction: Secondary | ICD-10-CM

## 2012-03-15 MED ORDER — GABAPENTIN 100 MG PO CAPS
100.0000 mg | ORAL_CAPSULE | Freq: Three times a day (TID) | ORAL | Status: DC
  Administered 2012-03-15 – 2012-03-19 (×11): 100 mg via ORAL
  Filled 2012-03-15 (×14): qty 1

## 2012-03-15 MED ORDER — PHENOL 1.4 % MT LIQD
1.0000 | OROMUCOSAL | Status: DC | PRN
  Filled 2012-03-15: qty 177

## 2012-03-15 MED ORDER — METOPROLOL TARTRATE 12.5 MG HALF TABLET
12.5000 mg | ORAL_TABLET | Freq: Two times a day (BID) | ORAL | Status: DC
  Administered 2012-03-15 – 2012-03-19 (×8): 12.5 mg via ORAL
  Filled 2012-03-15 (×11): qty 1

## 2012-03-15 MED ORDER — MENTHOL 3 MG MT LOZG
1.0000 | LOZENGE | OROMUCOSAL | Status: DC | PRN
  Filled 2012-03-15: qty 9

## 2012-03-15 MED ORDER — DOCUSATE SODIUM 100 MG PO CAPS
100.0000 mg | ORAL_CAPSULE | Freq: Two times a day (BID) | ORAL | Status: DC
  Administered 2012-03-15 – 2012-03-16 (×2): 100 mg via ORAL
  Filled 2012-03-15 (×4): qty 1

## 2012-03-15 MED ORDER — METOPROLOL SUCCINATE ER 50 MG PO TB24
50.0000 mg | ORAL_TABLET | Freq: Every day | ORAL | Status: DC
  Filled 2012-03-15: qty 1

## 2012-03-15 MED ORDER — OXYCODONE HCL 5 MG PO TABS
5.0000 mg | ORAL_TABLET | ORAL | Status: DC | PRN
  Administered 2012-03-15 – 2012-03-22 (×31): 10 mg via ORAL
  Administered 2012-03-22: 5 mg via ORAL
  Administered 2012-03-23 – 2012-04-03 (×52): 10 mg via ORAL
  Filled 2012-03-15 (×4): qty 2
  Filled 2012-03-15: qty 1
  Filled 2012-03-15 (×28): qty 2
  Filled 2012-03-15: qty 1
  Filled 2012-03-15 (×11): qty 2
  Filled 2012-03-15: qty 1
  Filled 2012-03-15 (×21): qty 2
  Filled 2012-03-15: qty 1
  Filled 2012-03-15 (×19): qty 2

## 2012-03-15 MED ORDER — ACETAMINOPHEN 325 MG PO TABS
650.0000 mg | ORAL_TABLET | ORAL | Status: DC | PRN
  Administered 2012-03-15 – 2012-03-20 (×2): 650 mg via ORAL
  Filled 2012-03-15 (×2): qty 2

## 2012-03-15 MED ORDER — AMLODIPINE BESYLATE 5 MG PO TABS
5.0000 mg | ORAL_TABLET | Freq: Every day | ORAL | Status: DC
  Filled 2012-03-15: qty 1

## 2012-03-15 MED ORDER — TRAZODONE HCL 50 MG PO TABS: 50.0000 mg | ORAL_TABLET | Freq: Every evening | ORAL | Status: DC | PRN

## 2012-03-15 MED ORDER — ALUM & MAG HYDROXIDE-SIMETH 200-200-20 MG/5ML PO SUSP: 30.0000 mL | ORAL | Status: DC | PRN

## 2012-03-15 MED ORDER — LISINOPRIL 20 MG PO TABS
20.0000 mg | ORAL_TABLET | Freq: Every day | ORAL | Status: DC
  Filled 2012-03-15 (×2): qty 1

## 2012-03-15 NOTE — Progress Notes (Signed)
I continue to await insurance approval to admit patient to CIR today. Please clal for questions. [phone]

## 2012-03-15 NOTE — H&P (Signed)
Physical Medicine and Rehabilitation Admission H&P    Chief Complaint  Patient presents with  . Fall with BUE weakness and tingling Bilateral hands  : HPI: Edward [name] is a 61 y.o. male with history of CAD, 24 hour history of diarrhea who got dizzy with near syncope and fall on 03/10/12. Patient with hypotension and complaints of pain in head and neck with tingling bilateral hands. MRI cervical spine revealed intraspinous edema C3-C7 with multilevel spondylosis most prominent at C4-C5 with edema/and or gliosis and cord compression. CT head negative for acute changes. CT maxillofacial With comminuted depressed bilateral nasal bone fractues with bowing of nasal septum to right. Drs Venetia Maxon and Sanger consulted for input. Cervical decompression recommended for central cord syndrome. Treated with steroid protocol.  Cleared for surgery by Dr. Myrtis Ser.  Patient underwent ACDF with PEEK cages C4/5 by Dr. Venetia Maxon and CR of nasal fracture by Dr. Kelly Splinter on 03/13/12. Post op continues with decreased coordination and motor planning BUE with persistent weakness in triceps and hand intrinsics with dysesthesias.  BLE instability continues.  Therapy team recommending CIR.   Pt denies pain but has persistent numbness in hands greater than feet  Review of Systems  HENT: Positive for neck pain. Negative for hearing loss.   Eyes: Negative for blurred vision and double vision.  Respiratory: Negative for cough and shortness of breath.   Cardiovascular: Negative for chest pain and palpitations.  Gastrointestinal: Positive for constipation. Negative for heartburn and nausea.  Genitourinary: Negative for dysuria and urgency.  Musculoskeletal:       Bilateral shoulder pain  Neurological: Positive for sensory change and focal weakness. Negative for headaches.  Psychiatric/Behavioral: The patient is not nervous/anxious and does not have insomnia.    Past Medical History  Diagnosis Date  . Measles   . Mumps   . Chicken  pox   . Whooping cough   . Pneumonia   . Hypertension   . Coronary artery disease     a. 1999 s/p MI with cath/PCI;  b. 05/1999 Ex Cardiolite EF 68%, no ishcemia.  . Dyslipidemia   . Hypokalemia   . History of tobacco abuse   . Near syncope   . Nasal bones, closed fracture     a. 02/2012 in setting of presyncope/fall  . Injury of cervical spine     a. 02/2012 C4/5   Past Surgical History  Procedure Date  . Pet[place] tendon surgery 1989  . Achilles tendon surgery 1989  . Cardiac catheterization 06/28/1998    single vessel CAD involving the distal RCA/PTCA and stenting of the distal RCA//EF- 50-55%  . Nm myoview ltd 05/17/2009    normal stress nuclear study/no evidence of ischemia/EF- 68%  . Anterior cervical decomp/discectomy fusion 03/13/2012    Procedure: ANTERIOR CERVICAL DECOMPRESSION/DISCECTOMY FUSION 1 LEVEL;  Surgeon: Maeola Harman, MD;  Location: [facility];  Service: Neurosurgery;  Laterality: N/A;  Anterior Cervical Decompression/Fusion. Cervical four-five.  . Closed reduction nasal fracture 03/13/2012    Procedure: CLOSED REDUCTION NASAL FRACTURE;  Surgeon: Wayland Denis, DO;  Location: [facility];  Service: Plastics;  Laterality: N/A;  Internal and external splinting of nasal fracture   Family History  Problem Relation Age of Onset  . Heart attack Father     80  . Cancer Mother     24   Social History: Married. Works as a Doctor, hospital. He reports that he quit smoking about 25 years ago. His smoking use included Cigarettes. He has a 20  pack-year smoking history. He does not have any smokeless tobacco history on file. He reports that he drinks alcohol- 1-2 glasses of wine at nights. His drug history not on file. Wife does not work and can provide supervision past discharge.   Allergies: No Known Allergies  Scheduled Meds:    . docusate sodium  100 mg Oral BID  . gabapentin  100 mg Oral TID  . metoprolol tartrate  12.5 mg Oral BID   Medications Prior to Admission    Medication Sig Dispense Refill  . amLODipine (NORVASC) 5 MG tablet Take 5 mg by mouth daily.      Marland Kitchen aspirin 81 MG tablet Take 81 mg by mouth daily.      . fish oil-omega-3 fatty acids 1000 MG capsule Take 3 g by mouth daily. Take three tablets by mouth once daily      . lisinopril-hydrochlorothiazide (PRINZIDE,ZESTORETIC) 20-25 MG per tablet Take 1 tablet by mouth daily.      . metoprolol succinate (TOPROL-XL) 50 MG 24 hr tablet Take 50 mg by mouth daily. Take with or immediately following a meal.      . Multiple Vitamin (MULTIVITAMIN) tablet Take 1 tablet by mouth daily.      . nitroGLYCERIN (NITROSTAT) 0.4 MG SL tablet place 1 tablet under the tongue if needed for chest pain  25 tablet  PRN  . potassium chloride (K-DUR,KLOR-CON) 10 MEQ tablet Take 10 mEq by mouth daily.      . simvastatin (ZOCOR) 80 MG tablet Take 80 mg by mouth at bedtime.        Home:     Functional History:    Functional Status:  Mobility:          ADL:    Cognition:       There were no vitals taken for this visit. Physical Exam  Nursing note and vitals reviewed. Constitutional: He is oriented to person, place, and time. He appears well-developed and well-nourished.  HENT:  Head: Normocephalic and atraumatic.       Nasal splint on nose.   Eyes: Pupils are equal, round, and reactive to light.  Neck:       Cervical incision clean and dry. Cervical collar in place.   Cardiovascular: Normal rate and regular rhythm.   Pulmonary/Chest: Effort normal and breath sounds normal.  Abdominal: Soft. Bowel sounds are normal.  Neurological: He is alert and oriented to person, place, and time. Coordination abnormal.       Decreased sensation BUE and bilateral feet. BUE with distal weakness intrinsics>triceps>biceps.  Follows commands without difficulty.  Voice slightly hoarse.   Skin: Skin is warm and dry.  Motor strength 2-/5 wrist ext, 0/5 finger flexor, 3-/5 in B delt and biceps, 4/5 B HF,KE,Ankle  DF/PF Able to distinguish lt touch to hands and feet but indicate hands feel less sensitive than feet  No results found for this or any previous visit (from the past 48 hour(s)). No results found.  Post Admission Physician Evaluation: 1. Functional deficits secondary  to central cord syndrome, incomplete spinal cord injury following a fall now s/p ACDF C4-5 POD #2. 2. Patient is admitted to receive collaborative, interdisciplinary care between the physiatrist, rehab nursing staff, and therapy team. 3. Patient's level of medical complexity and substantial therapy needs in context of that medical necessity cannot be provided at a lesser intensity of care such as a SNF. 4. Patient has experienced substantial functional loss from his/her baseline which was documented above under the "  Functional History" and "Functional Status" headings.  Judging by the patient's diagnosis, physical exam, and functional history, the patient has potential for functional progress which will result in measurable gains while on inpatient rehab.  These gains will be of substantial and practical use upon discharge  in facilitating mobility and self-care at the household level. 5. Physiatrist will provide 24 hour management of medical needs as well as oversight of the therapy plan/treatment and provide guidance as appropriate regarding the interaction of the two. 6. 24 hour rehab nursing will assist with bladder management, bowel management, safety, skin/wound care, disease management, medication administration, pain management and patient education  and help integrate therapy concepts, techniques,education, etc. 7. PT will assess and treat for:  Pre gait, gait, endurance safety, equipment, N-M re education.  Goals are: Mod I mobility. 8. OT will assess and treat for: ADL,Neuromuscular re education, safety , endurance , balance during ADL equipment.   Goals are: Supervision ADLs. 9. SLP will assess and treat for: NA.  Goals are:  NA. 10. Case Management and Social Worker will assess and treat for psychological issues and discharge planning. 11. Team conference will be held weekly to assess progress toward goals and to determine barriers to discharge. 12. Patient will receive at least 3 hours of therapy per day at least 5 days per week. 13. ELOS: 10-14 days      Prognosis:  good   Medical Problem List and Plan: 1. DVT Prophylaxis/Anticoagulation: Mechanical: Sequential compression devices, below knee Bilateral lower extremities 2. Pain Management: reasonable on prn medications.  3. Mood: motivated. Monitor for now.  4. Neuropsych: This patient is capable of making decisions on his/her own behalf. 5. HTN: BP has been variable.  Monitor with bid checks.  Resume BB at lower dose to avoid rebound tachycardia.  Hold off on resuming  prinizide and Norvasc.  6. CAD: monitor for symptoms. Resume low dose toprol and zocor.  7. Dysesthesias: continue low dose neurontin and titrate as needed.  8. Constipation: start miralax daily.   03/15/2012, 7:10 PM

## 2012-03-15 NOTE — Progress Notes (Signed)
Report given to rehab nurse. Pt transferred. Pt under no s/s distress.

## 2012-03-15 NOTE — Progress Notes (Signed)
Subjective: Patient reports "I'm ok...some better"  Objective: Vital signs in last 24 hours: Temp:  [97.4 F (36.3 C)-98.8 F (37.1 C)] 97.6 F (36.4 C) (08/16 0602) Pulse Rate:  [61-68] 66  (08/16 0602) Resp:  [8-18] 18  (08/16 0602) BP: (143-174)/(86-100) 143/98 mmHg (08/16 0602) SpO2:  [98 %-100 %] 98 % (08/16 0602)  Intake/Output from previous day: 08/15 0701 - 08/16 0700 In: 150 [I.V.:150] Out: 150 [Urine:150] Intake/Output this shift:    Alert, conversant. Good strength BLE. Weakness bilat triceps persists, some improvement AROM (gross motor only)both wrists/hands. Dermabond intact cervical incision. No erythema, swelling, or drainage. Nasal splints intact.    Lab Results: No results found for this basename: WBC:2,HGB:2,HCT:2,PLT:2 in the last 72 hours BMET No results found for this basename: NA:2,K:2,CL:2,CO2:2,GLUCOSE:2,BUN:2,CREATININE:2,CALCIUM:2 in the last 72 hours  Studies/Results: Dg Cervical Spine 2-3 Views  03/13/2012  *RADIOLOGY REPORT*  Clinical Data: ACDF C4-5.  OPERATIVE CERVICAL SPINE - 2-3 VIEW  Comparison: Cervical spine MRI 03/10/2012.  CT cervical spine 03/10/2012.  Findings: Images were submitted for interpretation post- operatively.  Initial image at 0955 hours demonstrates the localizer needle projected over the anterior C4-5 disc space. Second image at 1030 hours demonstrates ACDF with hardware and interbody fusion with plugs at C4-5.  Anatomic alignment through C5.  Fusion plugs appropriately positioned in the disc space.  IMPRESSION: ACDF C4-5.  Original Report Authenticated By: Arnell Sieving, M.D.    Assessment/Plan: Improving  LOS: 5 days  Hopeful of CIR - pending insurance approval.    Georgiann Cocker 03/15/2012, 9:14 AM

## 2012-03-15 NOTE — Progress Notes (Signed)
Physical Therapy Treatment Patient Details Name: Edward [name] MRN: [id number] DOB: 07/03/1951 Today's Date: 03/15/2012 Time: 4098-1191 PT Time Calculation (min): 15 min  PT Assessment / Plan / Recommendation Comments on Treatment Session  Focused session on control of quads concentrically and eccentrically during sit/stand. Pt with improvements with repetitions. Pt still anteriorly leaning. Continue per plan.     Follow Up Recommendations    Inpatient Rehab    Barriers to Discharge        Equipment Recommendations  Defer to next venue    Recommendations for Other Services Rehab consult  Frequency Min 3X/week   Plan Discharge plan remains appropriate    Precautions / Restrictions Precautions Precautions: Cervical Required Braces or Orthoses: Cervical Brace Cervical Brace: Hard collar;Applied in supine position Restrictions Weight Bearing Restrictions: No   Pertinent Vitals/Pain No pain throughout session    Mobility  Bed Mobility Bed Mobility: Supine to Sit;Sitting - Scoot to Edge of Bed Supine to Sit: 4: Min assist;With rails Sitting - Scoot to [employer] of Bed: 4: Min assist;With rail Details for Bed Mobility Assistance: Min assist through trunk into sitting as pt had difficulty bearing weight through UEs.  Transfers Transfers: Sit to Stand;Stand to [employer] Transfers Sit to Stand: 3: Mod assist;4: Min assist;With upper extremity assist;From bed;From chair/3-in-1 Stand to Sit: 4: Min assist;3: Mod assist;With upper extremity assist;To chair/3-in-1 Stand Pivot Transfers: 1: +2 Total assist Stand Pivot Transfers: Patient Percentage: 70% Details for Transfer Assistance: VC for safe technique as well as hand placement throughout transfer. Completed sit to/from stand 5 x for upright posture as pt with anterior lean upon standing. Min-Mod assist for controlled ascent/descent Ambulation/Gait Ambulation/Gait Assistance: Not tested (comment)     PT Goals Acute Rehab PT  Goals PT Goal: Supine/Side to Sit - Progress: Met PT Goal: Sit to Stand - Progress: Progressing toward goal PT Transfer Goal: Bed to Chair/Chair to Bed - Progress: Progressing toward goal  Visit Information  Last PT Received On: 03/15/12 Assistance Needed: +2       Cognition  Overall Cognitive Status: Appears within functional limits for tasks assessed/performed Arousal/Alertness: Awake/alert Orientation Level: Appears intact for tasks assessed Behavior During Session: [facility] for tasks performed       End of Session PT - End of Session Equipment Utilized During Treatment: Gait belt Activity Tolerance: Patient tolerated treatment well Patient left: in chair;with call bell/phone within reach;with family/visitor present Nurse Communication: Mobility status     [name] 03/15/2012, 4:29 PM

## 2012-03-15 NOTE — PMR Pre-admission (Signed)
PMR Admission Coordinator Pre-Admission Assessment  Patient: Edward [name] is an 61 y.o., male MRN: [id number] DOB: [birth year] Height: 6' (182.9 cm) Weight: 94.348 kg (208 lb)  Insurance Information HMO:     PPO: yes     PCP:      IPA:      80/20:      OTHER:  PRIMARY: Federal BCBS      Policy#: [id number]      Subscriber: pt CM Name: Amy      Phone#: [phone]     Fax#: [phone] Pre-Cert#: [id number] cert through 8/27 after team conference    Employer: [id number] Benefits:  Phone #: [phone]     Name: Purnell Shoemaker 03/12/12 [phone] Eff. Date: 08/07/92 active     Deduct: $700    met  Out of Pocket Max: $5000 met $1984.71      Life Max: unlimited CIR: $250 copay per admit then 85% coverage      SNF: no standard benefit Outpatient: $20 copay per visit with 75 visits combined      Home Health: 85%      Co-Pay: 15% 25 visits combined DME: 85%     Co-Pay: 15% Providers: in network  SECONDARY: none       Medicaid Application Date:       Case Manager:  Disability Application Date:       Case Worker:   Emergency Conservator, museum/gallery Information    Name Relation Home Work Mobile   [place] Spouse [phone]     [place], Carpenter [phone]       Current Medical History  Patient Admitting Diagnosis: cervical myelopathy with central cord syndrome  History of Present Illness:[name] is a 61 y.o. male with history of CAD, 24 hour history of diarrhea who got dizzy with near syncope and fall on 03/10/12. Patient with hypotension and complaints of pain in head and neck with tingling bilateral hands. MRI cervical spine revealed intraspinous edema C3-C7 with multilevel spondylosis most prominent at C4-C5 with edema/and or gliosis and cord compression. CT head negative for acute changes. CT maxillofacial With comminuted depressed bilateral nasal bone fractues with bowing of nasal septum to right. Drs Venetia Maxon and Sanger consulted for input. Patient with central cord syndrome  and will require cervical decompression pending cardiac clearance. Nasal fracture repair to be coordinated with NS. 8/13/13ANTERIOR CERVICAL DECOMPRESSION/DISCECTOMY FUSION 1 LEVEL C4/5 with PEEK cage, autograft and allograft, anterior cervical plate  CLOSED REDUCTION NASAL FRACTURE    Past Medical History  Past Medical History  Diagnosis Date  . Measles   . Mumps   . Chicken pox   . Whooping cough   . Pneumonia   . Hypertension   . Coronary artery disease     a. 1999 s/p MI with cath/PCI;  b. 05/1999 Ex Cardiolite EF 68%, no ishcemia.  . Dyslipidemia   . Hypokalemia   . History of tobacco abuse   . Near syncope   . Nasal bones, closed fracture     a. 02/2012 in setting of presyncope/fall  . Injury of cervical spine     a. 02/2012 C4/5    Family History  family history includes Cancer in his mother and Heart attack in his father.  Prior Rehab/Hospitalizations: none  Current Medications  Current facility-administered medications:acetaminophen (TYLENOL) suppository 650 mg, 650 mg, Rectal, Q4H PRN, Maeola Harman, MD;  acetaminophen (TYLENOL) tablet 650 mg, 650 mg, Oral, Q4H PRN, Maeola Harman, MD;  amLODipine (NORVASC) tablet 5 mg, 5 mg,  Oral, Daily, Maeola Harman, MD;  dextrose 5 % and 0.45 % NaCl with KCl 20 mEq/L infusion, , Intravenous, Continuous, Maeola Harman, MD, Last Rate: 75 mL/hr at 03/15/12 0945 diazepam (VALIUM) tablet 5 mg, 5 mg, Oral, Q6H PRN, Maeola Harman, MD, 5 mg at 03/15/12 1142;  docusate sodium (COLACE) capsule 100 mg, 100 mg, Oral, BID, Maeola Harman, MD, 100 mg at 03/15/12 0945;  gabapentin (NEURONTIN) capsule 100 mg, 100 mg, Oral, TID, Maeola Harman, MD, 100 mg at 03/15/12 0945;  HYDROcodone-acetaminophen (NORCO/VICODIN) 5-325 MG per tablet 1-2 tablet, 1-2 tablet, Oral, Q4H PRN, Maeola Harman, MD, 2 tablet at 03/15/12 1142 labetalol (NORMODYNE,TRANDATE) injection 5-20 mg, 5-20 mg, Intravenous, Q1H PRN, Hewitt Shorts, MD, 5 mg at 03/14/12 0203;  lisinopril  (PRINIVIL,ZESTRIL) tablet 20 mg, 20 mg, Oral, Daily, Maeola Harman, MD;  menthol-cetylpyridinium (CEPACOL) lozenge 3 mg, 1 lozenge, Oral, PRN, Maeola Harman, MD;  metoprolol succinate (TOPROL-XL) 24 hr tablet 50 mg, 50 mg, Oral, Daily, Maeola Harman, MD morphine 2 MG/ML injection 1-4 mg, 1-4 mg, Intravenous, Q3H PRN, Maeola Harman, MD, 2 mg at 03/15/12 0950;  ondansetron [facility]) injection 4 mg, 4 mg, Intravenous, Q4H PRN, Maeola Harman, MD;  oxyCODONE-acetaminophen (PERCOCET/ROXICET) 5-325 MG per tablet 1-2 tablet, 1-2 tablet, Oral, Q4H PRN, Maeola Harman, MD, 2 tablet at 03/15/12 4098;  pantoprazole (PROTONIX) injection 40 mg, 40 mg, Intravenous, QHS, Maeola Harman, MD, 40 mg at 03/14/12 2335 phenol (CHLORASEPTIC) mouth spray 1 spray, 1 spray, Mouth/Throat, PRN, Maeola Harman, MD;  zolpidem (AMBIEN) tablet 5 mg, 5 mg, Oral, QHS PRN,MR X 1, Maeola Harman, MD, 5 mg at 03/14/12 0207  Patients Current Diet: Clear Liquid  Precautions / Restrictions Precautions Precautions: Cervical Cervical Brace: Hard collar;Applied in supine position Restrictions Weight Bearing Restrictions: No   Prior Activity Level Community (5-7x/wk): [employer] employee Journalist, newspaper / Equipment Home Assistive Devices/Equipment: None Home Adaptive Equipment: Straight cane;Shower chair without back;Walker - rolling  Prior Functional Level Prior Function Level of Independence: Independent Able to Take Stairs?: Yes Driving: Yes Vocation: Full time employment  Current Functional Level Cognition  Arousal/Alertness: Awake/alert Overall Cognitive Status: Appears within functional limits for tasks assessed/performed Orientation Level: Oriented X4    Extremity Assessment (includes Sensation/Coordination)  RUE ROM/Strength/Tone: Deficits RUE ROM/Strength/Tone Deficits: shoulder flexion 90 degrees (WFL not assessed high due to cervical injury), wrist extension present slightly less than WFL, elbow flexion 4 out 5 AROM,  elbow extension 2- out 5 using compensatory strategies to extend., tricep 4 out 5  RUE Sensation: WFL - Light Touch RUE Coordination: Deficits RUE Coordination Deficits: grasp of hand 2 - out 5 and unable to perform fine motor task  RLE ROM/Strength/Tone: [facility] for tasks assessed;Deficits RLE ROM/Strength/Tone Deficits: weakness at Bil hip flexors at 4/5 otherwise>=4+/5 ;ankles and intrinsics appear less coordinated RLE Sensation: Deficits RLE Sensation Deficits: "pins and needles"    ADLs  Grooming: Performed;Teeth care;Maximal assistance (used a built up handle on tooth brush hand over hand) Where Assessed - Grooming: Supported sitting (pillows to (A) with elevating BIL UEs) Upper Body Dressing: Performed;Maximal assistance Where Assessed - Upper Body Dressing: Supported sitting (don new gown) Lower Body Dressing: Performed;+1 Total assistance Where Assessed - Lower Body Dressing: Supine, head of bed up Toilet Transfer: Simulated;+2 Total assistance Toilet Transfer: Patient Percentage: 50% Toilet Transfer Method: Sit to Barista: Raised toilet seat with arms (or 3-in-1 over toilet) Toileting - Clothing Manipulation and Hygiene: Simulated;+1 Total assistance Where Assessed - Glass blower/designer Manipulation and Hygiene: Sit  to stand from 3-in-1 or toilet Equipment Used: Gait belt Transfers/Ambulation Related to ADLs: Pt ambulated ~ 20 ft around the bed. pt with increased ability to motor plan and initiate movement this session compared to evaluation 03/11/12 ADL Comments: Pt positioned in chair on arrival. Pt with rotation of trunk to the Rt side. Pt sit<>stand with hand over hand to allow pt to push up with BIL hands for input. Pt in static standing with Rt LE hyperextended and trunk rotated at hips toward rt side. Pt with facilitation of NDT for neutral trunk and blocking RT LE to allow pt to bend Rt LE to prevent hyperextension. Pt with anterior lean and LOB. Pt  demonstrates ataxic movement     Mobility  Bed Mobility: Not assessed Rolling Right: 4: Min assist;With rail Right Sidelying to Sit: 4: Min guard;With rails;HOB flat Sitting - Scoot to Edge of Bed: 4: Min assist;With rail    Transfers  Transfers: Sit to Stand;Stand to Sit Sit to Stand: 1: +2 Total assist;Without upper extremity assist;From chair/3-in-1 Sit to Stand: Patient Percentage: 50% (to 60%) Stand to Sit: 1: +2 Total assist;Without upper extremity assist;To chair/3-in-1 Stand to Sit: Patient Percentage: 50% Squat Pivot Transfers: 3: Mod assist;Without upper extremity assistance    Ambulation / Gait / Stairs / Wheelchair Mobility  Ambulation/Gait Ambulation/Gait Assistance: 1: +2 Total assist Ambulation/Gait: Patient Percentage: 60% Ambulation Distance (Feet): 20 Feet Assistive device: 2 person hand held assist Ambulation/Gait Assistance Details: Ataxic/incoordinated gait with variable BOS and some scissoring.  Moderate support of trunk and assist through arms needed to control his gait and balance in an upright posture Gait Pattern: Step-through pattern;Decreased step length - right;Decreased step length - left;Decreased stride length;Ataxic;Scissoring Stairs: No    Posture / Balance Static Sitting Balance Static Sitting - Balance Support: No upper extremity supported Static Sitting - Level of Assistance: 5: Stand by assistance Dynamic Sitting Balance Dynamic Sitting - Balance Support: Feet supported Dynamic Sitting - Level of Assistance: 5: Stand by assistance Dynamic Sitting - Balance Activities: Forward lean/weight shifting;Lateral lean/weight shifting Static Standing Balance Static Standing - Balance Support: Bilateral upper extremity supported Static Standing - Level of Assistance: 1: +2 Total assist;Patient percentage (comment);Other (comment) (pt =50%) Static Standing - Comment/# of Minutes: 12 minuest working on knee control and w/shifting Dynamic Standing  Balance Dynamic Standing - Balance Support: Bilateral upper extremity supported;During functional activity (pregait activity incl. w/shift and stepping) Dynamic Standing - Level of Assistance: 1: +2 Total assist;Patient percentage (comment);Other (comment) (60%)     Previous Home Environment Living Arrangements: Spouse/significant other Lives With: Spouse;Other (Comment) (x3 kids) Available Help at Discharge: Family Type of Home: House Home Layout: Two level;1/2 bath on main level Alternate Level Stairs-Rails: Right Alternate Level Stairs-Number of Steps: 15 Home Access: Stairs to enter Entrance Stairs-Rails: Can reach both Entrance Stairs-Number of Steps: 10 Bathroom Shower/Tub: Walk-in shower;Door [employer]: Standard (small room for bathroom) Bathroom Accessibility: Yes How Accessible: Accessible via walker Home Care Services: No Additional Comments: work description: Doctor, hospital, drive fork lift,  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  Discharge Living Setting Plans for Discharge Living Setting: Patient's home;Lives with (comment) (wife) Type of Home at Discharge: House Discharge Home Layout: Two level;1/2 bath on main level Alternate Level Stairs-Rails: Right Alternate Level Stairs-Number of Steps: 15 steps Discharge Home Access: Stairs to enter Entrance Stairs-Rails: Can reach both Entrance Stairs-Number of Steps: 10 steps Discharge Bathroom Shower/Tub: Walk-in shower Discharge Bathroom Toilet: Standard Discharge Bathroom Accessibility: Yes How Accessible: Accessible via  walker Do you have any problems obtaining your medications?: No  Social/Family/Support Systems Patient Roles: Spouse;Parent;Other (Comment) (employee) Contact Information: [place] , wife Anticipated Caregiver: wife Anticipated Caregiver's Contact Information: see above Ability/Limitations of Caregiver: none Caregiver Availability: 24/7 Discharge Plan Discussed with Primary Caregiver: Yes Is Caregiver  In Agreement with Plan?: Yes Does Caregiver/Family have Issues with Lodging/Transportation while Pt is in Rehab?: No  Goals/Additional Needs Patient/Family Goal for Rehab: supervision with PT, supervision to min assist OT Expected length of stay: ELOS 2 to 3 weeks Pt/Family Agrees to Admission and willing to participate: Yes Program Orientation Provided & Reviewed with Pt/Caregiver Including Roles  & Responsibilities: Yes  Patient Condition: Please see physician update to information in consult dated 03/11/12. Preadmission Screen Completed By:  Clois Dupes, 03/15/2012 4:18 PM ______________________________________________________________________   Discussed status with Dr. Wynn Banker on 03/15/12 at  1616 and received telephone approval for admission today.  Admission Coordinator:  Clois Dupes, time  1324 Date 03/15/12.

## 2012-03-15 NOTE — Progress Notes (Signed)
As above.

## 2012-03-15 NOTE — Op Note (Signed)
NAME:  Edward [name], Edward [name] NO.:  [id number]  MEDICAL RECORD NO.:  [id number]  LOCATION: [facility]        FACILITY:[facility]  PHYSICIAN:  Wayland Denis, DO      DATE OF BIRTH:  Apr 10, 1951  DATE OF PROCEDURE:  03/13/2012 DATE OF DISCHARGE:                              OPERATIVE REPORT   PREOPERATIVE DIAGNOSIS:  Bilateral nasal fractures.  POSTOPERATIVE DIAGNOSIS:  Bilateral nasal fractures.  PROCEDURE:  Closed nasal reduction with internal and external fixation for closed nasal fracture.  ATTENDING:  Wayland Denis, DO.  ANESTHESIA:  General.  INDICATION FOR PROCEDURE:  The patient is a 61 year old gentleman who states that he fell and hit his head board with his face sustaining a nasal fracture and cervical spine injuries.  He is being worked up for the spinal by Dr. Venetia Maxon in Neurosurgery, consent was signed and confirmed.  The risks and complications were reviewed and included bleeding, scar, risk of anesthesia, the nasal bleeding.  Description of procedure, the patient taken to the operating room, placed on the operating room table in the supine position.  General anesthesia was administered.  Once adequate, a time-out was called.  All information was confirmed to be correct.  He was prepped and draped in the usual sterile fashion.  Lidocaine 1% with epinephrine was injected around the nose for intraoperative hemostasis and postop pain management.  Afrin pledgets were placed in the nose after waiting several minutes for the Afrin and epinephrine to take effect, the pledgets were removed.  The speculum and knife handle were used to reduce the nasal fracture with lifting and outfracturing from the injury location.  The septum was repositioned as well.  Internal splints were placed and secured with 3-0 nylon.  An external splint was applied after the Steri-Strips.  The patient tolerated the procedure well.  There were no complications.  He was awoken and  taken to recovery room in stable condition.     Wayland Denis, DO     CS/MEDQ  D:  03/14/2012  T:  03/14/2012  Job:  [id number]

## 2012-03-15 NOTE — Progress Notes (Signed)
Overall Plan of Care [facility]) Patient Details Name: Edward [name] MRN: [id number] DOB: March 14, 1951  Diagnosis:  Rehabilitation for central cord syndrome after a fall  Primary Diagnosis:    Quadriparesis due to central cord syndrome status post ACDF C4-C5 Co-morbidities: Pneumonia  .  Hypertension  .  Coronary artery disease  a. 1999 s/p MI with cath/PCI; b. 05/1999 Ex Cardiolite EF 68%, no ishcemia.  .  Dyslipidemia  .  Hypokalemia  .  History of tobacco abuse  .  Near syncope  .  Nasal bones, closed fracture  a. 02/2012 in setting    Functional Problem List  Patient demonstrates impairments in the following areas: Balance, Bladder, Bowel, Motor, Pain and Sensory   Basic ADL's: eating, grooming, bathing, dressing and toileting Advanced ADL's: no current advanced ADL goals  Transfers:  bed mobility, bed to chair, toilet, tub/shower and car Locomotion:  ambulation and stairs  Additional Impairments:  Functional use of upper extremity  Anticipated Outcomes Item Anticipated Outcome  Eating/Swallowing    Basic self-care    Tolieting  Mod assist  Bowel/Bladder  Urinal need help holding urinal  Transfers    Locomotion    Communication    Cognition    Pain  < 3  Safety/Judgment  Rail x 3  Other     Therapy Plan:         Team Interventions: Item RN PT OT SLP SW TR Other  Self Care/Advanced ADL Retraining         Neuromuscular Re-Education         Therapeutic Activities         UE/LE Strength Training/ROM         UE/LE Coordination Activities         Visual/Perceptual Remediation/Compensation         DME/Adaptive Equipment Instruction         Therapeutic Exercise         Balance/Vestibular Training         Patient/Family Education         Cognitive Remediation/Compensation         Functional Mobility Training         Ambulation/Gait Tour manager Facilitation         Bladder Management x        Bowel Management x        Disease Management/Prevention         Pain Management x        Medication Management x        Skin Care/Wound Management         Splinting/Orthotics         Discharge Planning         Psychosocial Support                            Team Discharge Planning: Destination:  Home Projected Follow-up:  Nursing, PT, OT and SLP Projected Equipment Needs:  Elevated Engineer, drilling Patient/family involved in discharge planning:  Yes  MD ELOS: 2 weeks Medical Rehab Prognosis:  Excellent Assessment: 61 year old male fell  sustaining a neck injury with onset of quadriparesis. Underwent ACDF at C4-C5 levels. Now requiring 24 7 rehabilitation M.D., RN, CIR level PT, OT

## 2012-03-15 NOTE — Progress Notes (Signed)
Pt arrived to room 4007 from 4No.01 via W/C, accompanied by daughter; alert and oriented x4.  Max assist of 3, scoot pvt transfer w/c-bed per NT report. No fine motor. VSS. C/O pain all ext's,neck,upper back; 10mg  Oxy IR given; report to 7pm shift.

## 2012-03-16 ENCOUNTER — Inpatient Hospital Stay (HOSPITAL_COMMUNITY): Payer: Federal, State, Local not specified - PPO | Admitting: Physical Therapy

## 2012-03-16 ENCOUNTER — Inpatient Hospital Stay (HOSPITAL_COMMUNITY): Payer: Federal, State, Local not specified - PPO

## 2012-03-16 DIAGNOSIS — G8250 Quadriplegia, unspecified: Secondary | ICD-10-CM

## 2012-03-16 DIAGNOSIS — Z5189 Encounter for other specified aftercare: Secondary | ICD-10-CM

## 2012-03-16 MED ORDER — SORBITOL 70 % SOLN
30.0000 mL | Freq: Every day | Status: DC | PRN
  Administered 2012-03-16 – 2012-03-30 (×6): 30 mL via ORAL
  Filled 2012-03-16 (×6): qty 30

## 2012-03-16 MED ORDER — BISACODYL 10 MG RE SUPP
10.0000 mg | Freq: Every day | RECTAL | Status: DC | PRN
  Administered 2012-03-21 – 2012-04-01 (×3): 10 mg via RECTAL
  Filled 2012-03-16 (×3): qty 1

## 2012-03-16 MED ORDER — SENNOSIDES-DOCUSATE SODIUM 8.6-50 MG PO TABS
2.0000 | ORAL_TABLET | Freq: Two times a day (BID) | ORAL | Status: DC
  Administered 2012-03-16 – 2012-04-03 (×34): 2 via ORAL
  Filled 2012-03-16 (×4): qty 2
  Filled 2012-03-16: qty 1
  Filled 2012-03-16 (×22): qty 2
  Filled 2012-03-16: qty 1
  Filled 2012-03-16 (×10): qty 2

## 2012-03-16 NOTE — Evaluation (Signed)
Occupational Therapy Assessment and Plan  Patient Details  Name: Edward [name] MRN: [id number] Date of Birth: Nov 20, 1950  OT Diagnosis: abnormal posture and muscle weakness (generalized) Rehab Potential: Rehab Potential: Good ELOS: 2-3 weeks   Today's Date: 03/16/2012 Time: 0905-1005 Time Calculation (min): 60 min  Problem List:  Patient Active Problem List  Diagnosis  . Nasal bone fracture  . CAD (coronary artery disease)    Past Medical History:  Past Medical History  Diagnosis Date  . Measles   . Mumps   . Chicken pox   . Whooping cough   . Pneumonia   . Hypertension   . Coronary artery disease     a. 1999 s/p MI with cath/PCI;  b. 05/1999 Ex Cardiolite EF 68%, no ishcemia.  . Dyslipidemia   . Hypokalemia   . History of tobacco abuse   . Near syncope   . Nasal bones, closed fracture     a. 02/2012 in setting of presyncope/fall  . Injury of cervical spine     a. 02/2012 C4/5   Past Surgical History:  Past Surgical History  Procedure Date  . [place] tendon surgery 1989  . Achilles tendon surgery 1989  . Cardiac catheterization 06/28/1998    single vessel CAD involving the distal RCA/PTCA and stenting of the distal RCA//EF- 50-55%  . Nm myoview ltd 05/17/2009    normal stress nuclear study/no evidence of ischemia/EF- 68%  . Anterior cervical decomp/discectomy fusion 03/13/2012    Procedure: ANTERIOR CERVICAL DECOMPRESSION/DISCECTOMY FUSION 1 LEVEL;  Surgeon: Maeola Harman, MD;  Location: [facility];  Service: Neurosurgery;  Laterality: N/A;  Anterior Cervical Decompression/Fusion. Cervical four-five.  . Closed reduction nasal fracture 03/13/2012    Procedure: CLOSED REDUCTION NASAL FRACTURE;  Surgeon: Wayland Denis, DO;  Location: [facility];  Service: Plastics;  Laterality: N/A;  Internal and external splinting of nasal fracture    Assessment & Plan Clinical Impression: Patient is a 61 y.o. male with history of CAD, 24 hour history of diarrhea who got dizzy  with near syncope and fall on 03/10/12.   Patient complained of pain in head and neck with tingling bilateral hands. MRI cervical spine revealed intraspinous edema C3-C7 with multilevel spondylosis most prominent at C4-C5 with edema/and or gliosis and cord compression. CT head negative for acute changes. CT maxillofacial With comminuted depressed bilateral nasal bone fractues with bowing of nasal septum to right. Drs Venetia Maxon and Sanger consulted for input. Cervical decompression recommended for central cord syndrome. Treated with steroid protocol. Cleared for surgery by Dr. Myrtis Ser. Patient underwent ACDF with PEEK cages C4/5 by Dr. Venetia Maxon and CR of nasal fracture by Dr. Kelly Splinter on 03/13/12. Post op continues with decreased coordination and motor planning BUE with persistent weakness in triceps and hand intrinsics with dysesthesias. BLE instability continues. Therapy team recommending CIR.  Patient transferred to CIR on 03/15/2012 .    Patient currently requires max with basic self-care skills secondary to unbalanced muscle activation and decreased coordination.  Prior to hospitalization, patient could complete BADL and IADL independently.  Patient will benefit from skilled intervention to increase independence with basic self-care skills and increase level of independence with iADL prior to discharge home with care partner.  Anticipate patient will require minimal physical assistance and follow up outpatient.  OT - End of Session Activity Tolerance: Improving Endurance Deficit: No OT Assessment Rehab Potential: Good Barriers to Discharge: Inaccessible home environment Barriers to Discharge Comments: 10 steps in to home. OT Plan OT Frequency: 1-2 X/day,  60-90 minutes Estimated Length of Stay: 2-3 weeks OT Treatment/Interventions: Balance/vestibular training;DME/adaptive equipment instruction;Functional mobility training;Patient/family education;Pain management;Neuromuscular  re-education;Splinting/orthotics;UE/LE Strength taining/ROM;Therapeutic Exercise;Therapeutic Activities;UE/LE Coordination activities;Self Care/advanced ADL retraining OT Recommendation Equipment Recommended: Wheelchair cushion (measurements)  OT Evaluation Precautions/Restrictions  Precautions Precautions: Cervical;Fall Precaution Comments: s/p ACDF and has weakness in B UE's and  Required Braces or Orthoses: Cervical Brace Cervical Brace: Hard collar Restrictions Weight Bearing Restrictions: No General Chart Reviewed: Yes  Vital Signs Therapy Vitals Temp: 98.5 F (36.9 C) Temp src: Oral Pulse Rate: 65  Resp: 16  BP: 135/78 mmHg Patient Position, if appropriate: Lying Oxygen Therapy SpO2: 98 % O2 Device: None (Room air)  Pain Pain Assessment Pain Assessment: 0-10 Pain Score:   7 Pain Type: Neuropathic pain Pain Location: Hand Pain Orientation: Right;Left Pain Descriptors: Sharp Pain Onset: On-going Pain Intervention(s): Medication (See eMAR)  Home Living/Prior Functioning Home Living Lives With: Spouse Available Help at Discharge: Other (Comment) (wife and 3 adult children at home able to assist) Type of Home: House Home Access: Stairs to enter [employer] of Steps: 10 Entrance Stairs-Rails: Right Home Layout: Two level Alternate Level Stairs-Number of Steps: 15 Alternate Level Stairs-Rails: Right Bathroom Shower/Tub: Health visitor: Standard Bathroom Accessibility: Yes How Accessible: Accessible via walker Home Adaptive Equipment: Shower chair without back;Walker - rolling Additional Comments: equipment at hme was wife's from spinal surgery for scoliosis. IADL History Homemaking Responsibilities: Yes Meal Prep Responsibility: Secondary Laundry Responsibility: Secondary Cleaning Responsibility: Secondary Bill Paying/Finance Responsibility: Primary Shopping Responsibility: Secondary Child Care Responsibility:  Secondary Homemaking Comments: grandchild at home Current License: Yes Mode of Transportation: Car Occupation: Full time employment Type of Occupation: Doctor, hospital, Radiographer, therapeutic Leisure and Hobbies: exercises at [employer] "workout", ride bike Prior Function Level of Independence: Independent with basic ADLs;Independent with homemaking with ambulation;Independent with gait;Independent with transfers Able to Take Stairs?: Yes Driving: Yes Vocation: Full time employment 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 Leisure: Hobbies-yes (Comment) (goes to gym to ride bike and works out with [employer])  Vision/Perception  Vision - History Baseline Vision: Bifocals Patient Visual Report: No change from baseline Vision - Assessment Eye Alignment: Within Functional Limits Perception Perception: Within Functional Limits Praxis Praxis: Intact   Cognition Overall Cognitive Status: Appears within functional limits for tasks assessed Arousal/Alertness: Awake/alert Orientation Level: Oriented X4 Attention: Alternating Alternating Attention: Appears intact Memory: Appears intact Awareness: Appears intact Problem Solving: Appears intact Executive Function: Initiating Initiating: Appears intact Safety/Judgment: Appears intact  Sensation Sensation Light Touch: Impaired by gross assessment Stereognosis: Impaired by gross assessment Proprioception: Impaired by gross assessment Coordination Gross Motor Movements are Fluid and Coordinated: No Fine Motor Movements are Fluid and Coordinated: No Coordination and Movement Description: impaired dexterity, unable to grasp/release bilaterally  Motor  Motor Motor: Abnormal postural alignment and control Motor - Skilled Clinical Observations: during standing at sink  Mobility      Trunk/Postural Assessment  Cervical Assessment Cervical Assessment: Exceptions to [facility] (wearing hard c-collar) Postural Control Postural  Control: Deficits on evaluation (standing with trunk and knee flexion)   Balance Balance Balance Assessed: Yes Static Sitting Balance Static Sitting - Balance Support: No upper extremity supported;Feet supported Static Sitting - Level of Assistance: 5: Stand by assistance Dynamic Sitting Balance Dynamic Sitting - Balance Support: Feet supported Dynamic Sitting - Level of Assistance: 5: Stand by assistance Dynamic Sitting - Balance Activities: Forward lean/weight shifting;Lateral lean/weight shifting Static Standing Balance Static Standing - Balance Support: Bilateral upper extremity supported Static Standing - Level  of Assistance: 1: +2 Total assist Static Standing - Comment/# of Minutes: 2 minutes Dynamic Standing Balance Dynamic Standing - Balance Support: Bilateral upper extremity supported Dynamic Standing - Level of Assistance: 1: +2 Total assist  Extremity/Trunk Assessment RUE Strength Right Wrist Flexion: 2+/5 Right Wrist Extension: 2/5 Right Wrist Radial Deviation: 2/5 Right Wrist Ulnar Deviation: 2/5 Gross Grasp: Impaired LUE Strength Left Wrist Flexion: 2+/5 Left Wrist Extension: 2+/5 Left Wrist Radial Deviation: 2/5 Left Wrist Ulnar Deviation: 2/5 Gross Grasp: Impaired  See FIM for current functional status Refer to Care Plan for Long Term Goals  Recommendations for other services: None  Discharge Criteria: Patient will be discharged from OT if patient refuses treatment 3 consecutive times without medical reason, if treatment goals not met, if there is a change in medical status, if patient makes no progress towards goals or if patient is discharged from hospital.  The above assessment, treatment plan, treatment alternatives and goals were discussed and mutually agreed upon: by patient and by family  SESSION NOTES  Session #1  0905-1005 - 60 Minutes  Individual Therapy   Patient with 8/10 complaints of pain; RN aware  Initial 1:1 occupational therapy  evaluation completed. Engaged in bed mobility, UB/LB bathing & dressing at sink level, functional use of bilateral UEs, washing face at sink level, functional transfer training. Patient functioning at an overall mod assist ->max assist  for functional tasks at this time.   Session #2  1610-9604 - 28 Minutes  Individual Therapy  Patient with 7/10 complaint of pain  Patient educated and trained on functional hand exercises, safe transfers with family (daughter and spouse present; dtr is CNA). Wife states that her father had similar condition but did not recovery lower extremity function.   OT provided initial training on 2 person family-assisted transfers (dtr and spouse) but skills demonstrated were not endorsed as adequate for independently assisting patient due to fall risk from incomplete performance of transfers.  OT noted that patient's energy was quite depleted and spouse clarified that patient is use to working 3rd shift and usually sleeps in early afternoon.  Georgeanne Nim 03/16/2012, 4:07 PM

## 2012-03-16 NOTE — Progress Notes (Signed)
Physical Therapy Session Note  Patient Details  Name: Edward [name] MRN: [id number] Date of Birth: May 13, 1951  Today's Date: 03/16/2012 Time: 7829-5621 Time Calculation (min): 45 min  Short Term Goals: Week 1:  PT Short Term Goal 1 (Week 1): Patient will be able to perform bed mobility with min-A PT Short Term Goal 2 (Week 1): Patient will be able to perform transfers with min-A PT Short Term Goal 3 (Week 1): Patient will be able to ambulate x 30' using LRAD with Mod-Assist x 2  Skilled Therapeutic Interventions/Progress Updates:  Ambulation/gait training;Balance/vestibular training;DME/adaptive equipment instruction;Functional mobility training;Pain management;Patient/family education;Stair training;Therapeutic Activities;Therapeutic Exercise;UE/LE Strength taining/ROM;UE/LE Coordination activities   Therapy Documentation Precautions:  Precautions Precautions: Cervical;Fall Precaution Comments: s/p ACDF and has weakness in B UE's and  Required Braces or Orthoses: Cervical Brace Cervical Brace: Hard collar Restrictions Weight Bearing Restrictions: No Pain: Pain Score:   7 Pain Type: Neuropathic pain in B hands and notes pain in shoulders Patients Stated Pain Goal: 3 Pain Intervention(s): Medication (See eMAR) Multiple Pain Sites: Yes  Therapeutic Exercise:(15') B LE's in supine and in sitting Therapeutic Activity:(30') bed mobility with emphasis on logroll but patient finding it hard to perform in sequence but is demonstrating safety with in/out of bed                                          Transfers sit<->stand with handheld/Mod-Assist with verbal cues to stand upright and straighten knees and when sitting to control descent                                                           Multiple sit<->stands with standing mini-squats x 3 reps at a time.                                                           Bed<->w/c with 4 steps while pivoting (6 x with rest breaks in  between) Patient tends to step/ambulate with flexed knees and is unsure of strength to sustain weight bearing.  Recommend extra person assist with ambulation away from w/c or bed initially.  See FIM for current functional status  Therapy/Group: Individual Therapy  Rex Kras 03/16/2012, 2:39 PM

## 2012-03-16 NOTE — Plan of Care (Signed)
Problem: RH PAIN MANAGEMENT Goal: RH STG PAIN MANAGED AT OR BELOW PT'S PAIN GOAL Keep pain at less than 3 out of 10  Outcome: Not Progressing Patient lowest pain score today was 7 our of 10. Patient pain reported mostly in hands- sharp tingling.

## 2012-03-16 NOTE — Evaluation (Signed)
Physical Therapy Assessment and Plan  Patient Details  Name: Edward [name] MRN: [id number] Date of Birth: 02-27-1951  PT Diagnosis: Abnormal posture, Abnormality of gait, Ataxia, Difficulty walking and Muscle weakness Rehab Potential: Good ELOS: 2-3 weeks   Today's Date: 03/16/2012 Time: 1100-1200 Time Calculation (min): 60 min  Problem List:  Patient Active Problem List  Diagnosis  . Nasal bone fracture  . CAD (coronary artery disease)    Past Medical History:  Past Medical History  Diagnosis Date  . Measles   . Mumps   . Chicken pox   . Whooping cough   . Pneumonia   . Hypertension   . Coronary artery disease     a. 1999 s/p MI with cath/PCI;  b. 05/1999 Ex Cardiolite EF 68%, no ishcemia.  . Dyslipidemia   . Hypokalemia   . History of tobacco abuse   . Near syncope   . Nasal bones, closed fracture     a. 02/2012 in setting of presyncope/fall  . Injury of cervical spine     a. 02/2012 C4/5   Past Surgical History:  Past Surgical History  Procedure Date  . [place] tendon surgery 1989  . Achilles tendon surgery 1989  . Cardiac catheterization 06/28/1998    single vessel CAD involving the distal RCA/PTCA and stenting of the distal RCA//EF- 50-55%  . Nm myoview ltd 05/17/2009    normal stress nuclear study/no evidence of ischemia/EF- 68%  . Anterior cervical decomp/discectomy fusion 03/13/2012    Procedure: ANTERIOR CERVICAL DECOMPRESSION/DISCECTOMY FUSION 1 LEVEL;  Surgeon: Maeola Harman, MD;  Location: [facility];  Service: Neurosurgery;  Laterality: N/A;  Anterior Cervical Decompression/Fusion. Cervical four-five.  . Closed reduction nasal fracture 03/13/2012    Procedure: CLOSED REDUCTION NASAL FRACTURE;  Surgeon: Wayland Denis, DO;  Location: [facility];  Service: Plastics;  Laterality: N/A;  Internal and external splinting of nasal fracture    Assessment & Plan Clinical Impression:Edward [name] is a 61 y.o. male with history of CAD, 24 hour history of  diarrhea who got dizzy with near syncope and fall on 03/10/12. Patient with hypotension and complaints of pain in head and neck with tingling bilateral hands. MRI cervical spine revealed intraspinous edema C3-C7 with multilevel spondylosis most prominent at C4-C5 with edema/and or gliosis and cord compression. CT head negative for acute changes. CT maxillofacial With comminuted depressed bilateral nasal bone fractues with bowing of nasal septum to right. Drs Venetia Maxon and Sanger consulted for input. Cervical decompression recommended for central cord syndrome. Treated with steroid protocol. Cleared for surgery by Dr. Myrtis Ser. Patient underwent ACDF with PEEK cages C4/5 by Dr. Venetia Maxon and CR of nasal fracture by Dr. Kelly Splinter on 03/13/12. Post op continues with decreased coordination and motor planning BUE with persistent weakness in triceps and hand intrinsics with dysesthesias. BLE instability continues.    Patient transferred to CIR on 03/15/2012 .   Patient currently requires min with mobility secondary to ataxia and weakness.  Prior to hospitalization, patient was Independent with mobility and lived with Spouse in a House home.  Home access is 10Stairs to enter.  Patient will benefit from skilled PT intervention to maximize safe functional mobility, minimize fall risk and decrease caregiver burden for planned discharge home with 24 hour assist.  Anticipate patient will benefit from follow up [facility] at discharge.  PT - End of Session Endurance Deficit: No PT Assessment Rehab Potential: Good PT Plan PT Frequency: 1-2 X/day, 60-90 minutes PT Treatment/Interventions: Ambulation/gait training;Balance/vestibular training;DME/adaptive equipment instruction;Functional  mobility training;Pain management;Patient/family education;Stair training;Therapeutic Activities;Therapeutic Exercise;UE/LE Strength taining/ROM;UE/LE Coordination activities PT Recommendation Follow Up Recommendations: Home health PT  PT  Evaluation Precautions/Restrictions Precautions Precautions: Cervical;Fall Precaution Comments: s/p ACDF and has weakness in B UE's and  Required Braces or Orthoses: Cervical Brace Cervical Brace: Hard collar Restrictions Weight Bearing Restrictions: No Pain Pain Assessment Pain Assessment: 0-10 Pain Score:   8 Pain Type: Neuropathic pain Pain Location: Hand Pain Orientation: Right;Left Pain Descriptors: Sharp Pain Onset: On-going Patients Stated Pain Goal: 3 Pain Intervention(s): Medication (See eMAR);Repositioned Multiple Pain Sites: Yes 2nd Pain Site Pain Location: Scapula (B shoulder blade pains from lying on back too long) Home Living/Prior Functioning Home Living Lives With: Spouse Available Help at Discharge: Other (Comment) (wife and 3 adult children at home able to assist) Type of Home: House Home Access: Stairs to enter [employer] of Steps: 10 Entrance Stairs-Rails: Right Home Layout: Two level Alternate Level Stairs-Number of Steps: 15 Alternate Level Stairs-Rails: Right Bathroom Shower/Tub: Health visitor: Standard Bathroom Accessibility: Yes How Accessible: Accessible via walker Home Adaptive Equipment: Shower chair without back;Walker - rolling Additional Comments: equipment at hme was wife's from spinal surgery for scoliosis. 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 Leisure: Hobbies-yes (Comment) (goes to gym to ride bike and works out with [employer]) Vision/Perception  Vision - History Baseline Vision: Bifocals Patient Visual Report: No change from baseline  Cognition Overall Cognitive Status: Appears within functional limits for tasks assessed Arousal/Alertness:  Awake/alert Orientation Level: Oriented X4 Sensation Sensation Light Touch: Impaired by gross assessment (diminished sensation B hands and fingers) Coordination Heel Shin Test: mildly impaired due to weakness Motor  Motor Motor: Within Functional Limits  Mobility Bed Mobility Bed Mobility: Rolling Left;Left Sidelying to Sit;Sitting - Scoot to Edge of Bed Rolling Left: 4: Min assist Rolling Left Details: Verbal cues for technique;Manual facilitation for placement Left Sidelying to Sit: 4: Min guard;With rails;HOB flat Supine to Sit: 4: Min assist;With rails;HOB flat Supine to Sit Details: Verbal cues for precautions/safety;Tactile cues for placement Sitting - Scoot to Edge of Bed: 4: Min assist Sitting - Scoot to [place] of Bed Details: Tactile cues for placement;Verbal cues for precautions/safety Transfers Sit to Stand: 3: Mod assist;With upper extremity assist;From bed Sit to Stand Details: Tactile cues for weight shifting;Tactile cues for placement;Verbal cues for technique;Verbal cues for precautions/safety Stand to Sit: 4: Min assist Stand Pivot Transfers: 1: +1 Total assist Stand Pivot Transfer Details: Tactile cues for placement;Verbal cues for precautions/safety Squat Pivot Transfers: Not tested (comment) Locomotion  Ambulation Ambulation: Yes Ambulation/Gait Assistance: 2: Max assist Ambulation Distance (Feet): 3 Feet Assistive device: Eva walker Ambulation/Gait Assistance Details: Tactile cues for weight shifting;Tactile cues for posture;Verbal cues for sequencing;Verbal cues for technique;Verbal cues for precautions/safety;Verbal cues for gait pattern;Manual facilitation for weight shifting;Manual facilitation for placement Ambulation/Gait Assistance Details: ataxic gait with variable BOS. trunk and knees tend to flex but will straighten with verbal cues.  Will recommend 2-person assist for ambulation. Gait Gait: Yes Gait Pattern: Impaired Gait Pattern: Step-through  pattern;Decreased step length - right;Decreased step length - left;Ataxic Stairs / Additional Locomotion Stairs: No Wheelchair Mobility Wheelchair Mobility: No (unable to grip with hands B and triceps 3-/5)  Trunk/Postural Assessment  Cervical Assessment Cervical Assessment: Exceptions to [facility] (wearing hard c-collar) Postural Control Postural Control: Deficits on evaluation (standing  with trunk and knee flexion)  Balance Balance Balance Assessed: Yes Static Sitting Balance Static Sitting - Balance Support: No upper extremity supported;Feet supported Static Sitting - Level of Assistance: 5: Stand by assistance Dynamic Sitting Balance Dynamic Sitting - Balance Support: Feet supported Dynamic Sitting - Level of Assistance: 5: Stand by assistance Dynamic Sitting - Balance Activities: Forward lean/weight shifting;Lateral lean/weight shifting Static Standing Balance Static Standing - Balance Support: Bilateral upper extremity supported Static Standing - Level of Assistance: 1: +2 Total assist Static Standing - Comment/# of Minutes: 2 minutes Dynamic Standing Balance Dynamic Standing - Balance Support: Bilateral upper extremity supported Dynamic Standing - Level of Assistance: 1: +2 Total assist Extremity Assessment  RLE Assessment RLE Assessment: Within Functional Limits LLE Assessment LLE Assessment: Within Functional Limits  See FIM for current functional status Refer to Care Plan for Long Term Goals  Recommendations for other services: None  Discharge Criteria: Patient will be discharged from PT if patient refuses treatment 3 consecutive times without medical reason, if treatment goals not met, if there is a change in medical status, if patient makes no progress towards goals or if patient is discharged from hospital.  The above assessment, treatment plan, treatment alternatives and goals were discussed and mutually agreed upon: by patient  Rex Kras 03/16/2012, 12:23  PM

## 2012-03-16 NOTE — Progress Notes (Signed)
Patient ID: Edward [name], male   DOB: 1951-06-08, 61 y.o.   MRN: [id number]  Subjective/Complaints: Mouth is dry. No other c/o  Objective: Vital Signs: Blood pressure 160/91, pulse 61, temperature 97.3 F (36.3 C), temperature source Oral, resp. rate 18, weight 100 kg (220 lb 7.4 oz), SpO2 99.00%. No results found. No results found for this or any previous visit (from the past 72 hour(s)).   HEENT: normal Cardio: RRR Resp: CTA B/L GI: BS positive Extremity:  No Edema Skin:   Intact Neuro: Alert/Oriented, Abnormal Sensory reduced to lt touch in hands, Abnormal Motor 2-/5 in wrist ext,0/5 in grtip, 3/5 in biceps and triceps, 4/5 in BLE, Abnormal FMC Tone  increased ext tone in LEs reduce dtome in arms and Reflexes: 3+ Musc/Skel:  Swelling dorsum of hands   Assessment/Plan: 1. Functional deficits secondary to Central cord syndrome after fall s/p ACDF 4-5 POD #3 which require 3+ hours per day of interdisciplinary therapy in a comprehensive inpatient rehab setting. Physiatrist is providing close team supervision and 24 hour management of active medical problems listed below. Physiatrist and rehab team continue to assess barriers to discharge/monitor patient progress toward functional and medical goals. FIM:       FIM - Toileting Toileting: 1: Two helpers           Comprehension Comprehension Mode: Auditory Comprehension: 6-Follows complex conversation/direction: With extra time/assistive device  Expression Expression Mode: Verbal Expression: 6-Expresses complex ideas: With extra time/assistive device  Social Interaction Social Interaction: 6-Interacts appropriately with others with medication or extra time (anti-anxiety, antidepressant).  Problem Solving Problem Solving: 5-Solves complex 90% of the time/cues < 10% of the time  Memory Memory: 5-Recognizes or recalls 90% of the time/requires cueing < 10% of the time   Medical Problem List and Plan:  1. DVT  Prophylaxis/Anticoagulation: Mechanical: Sequential compression devices, below knee Bilateral lower extremities  2. Pain Management: reasonable on prn medications.  3. Mood: motivated. Monitor for now.  4. Neuropsych: This patient is capable of making decisions on his/her own behalf.  5. HTN: BP has been variable. Monitor with bid checks. Resume BB at lower dose to avoid rebound tachycardia. Hold off on resuming prinizide and Norvasc.  6. CAD: monitor for symptoms. Resume low dose toprol and zocor.  7. Dysesthesias: continue low dose neurontin and titrate as needed.  8. Constipation: start miralax    LOS (Days) 1 A FACE TO FACE EVALUATION WAS PERFORMED  Yehya Brendle E 03/16/2012, 6:40 AM

## 2012-03-17 ENCOUNTER — Inpatient Hospital Stay (HOSPITAL_COMMUNITY): Payer: Federal, State, Local not specified - PPO

## 2012-03-17 MED ORDER — AMLODIPINE BESYLATE 2.5 MG PO TABS
2.5000 mg | ORAL_TABLET | Freq: Every day | ORAL | Status: DC
  Administered 2012-03-17 – 2012-03-22 (×6): 2.5 mg via ORAL
  Filled 2012-03-17 (×8): qty 1

## 2012-03-17 NOTE — Progress Notes (Signed)
Patient ID: Edward [name], male   DOB: Oct 25, 1950, 61 y.o.   MRN: [id number]  Subjective/Complaints: Mouth is dry. Burning pain but not bad enough to increase meds-per pt  Objective: Vital Signs: Blood pressure 162/91, pulse 70, temperature 98.2 F (36.8 C), temperature source Oral, resp. rate 18, weight 100 kg (220 lb 7.4 oz), SpO2 98.00%. No results found. No results found for this or any previous visit (from the past 72 hour(s)).   HEENT: normal Cardio: RRR Resp: CTA B/L GI: BS positive Extremity:  No Edema Skin:   Intact Neuro: Alert/Oriented, Abnormal Sensory reduced to lt touch in hands, Abnormal Motor 2-/5 in wrist ext,0/5 in grtip, 3/5 in biceps and triceps, 4/5 in BLE, Abnormal FMC Tone  increased ext tone in LEs reduce dtome in arms and Reflexes: 3+ Musc/Skel:  Swelling dorsum of hands   Assessment/Plan: 1. Functional deficits secondary to Central cord syndrome after fall s/p ACDF 4-5 POD #5 which require 3+ hours per day of interdisciplinary therapy in a comprehensive inpatient rehab setting. Physiatrist is providing close team supervision and 24 hour management of active medical problems listed below. Physiatrist and rehab team continue to assess barriers to discharge/monitor patient progress toward functional and medical goals. FIM: FIM - Bathing Bathing Steps Patient Completed: Abdomen;Chest Bathing: 1: Total-Patient completes 0-2 of 10 parts or less than 25%  FIM - Upper Body Dressing/Undressing Upper body dressing/undressing: 0: Wears gown/pajamas-no public clothing FIM - Lower Body Dressing/Undressing Lower body dressing/undressing: 0: Wears gown/pajamas-no public clothing  FIM - Toileting Toileting: 0: Activity did not occur  FIM - Diplomatic Services operational officer Devices: Grab bars Toilet Transfers: 3-To toilet/BSC: Mod A (lift or lower assist);3-From toilet/BSC: Mod A (lift or lower assist)  FIM - Bed/Chair Transfer Bed/Chair Transfer  Assistive Devices: Bed rails Bed/Chair Transfer: 5: Supine > Sit: Supervision (verbal cues/safety issues);5: Sit > Supine: Supervision (verbal cues/safety issues);2: Bed > Chair or W/C: Max A (lift and lower assist);2: Chair or W/C > Bed: Max A (lift and lower assist)  FIM - Locomotion: Wheelchair Locomotion: Wheelchair: 0: Activity did not occur (unable to grip with hands/fingers) FIM - Locomotion: Ambulation Locomotion: Ambulation Assistive Devices: Other (comment) (hand held assist x 3 feet) Ambulation/Gait Assistance: 2: Max assist  Comprehension Comprehension Mode: Auditory Comprehension: 6-Follows complex conversation/direction: With extra time/assistive device  Expression Expression Mode: Verbal Expression: 6-Expresses complex ideas: With extra time/assistive device  Social Interaction Social Interaction: 6-Interacts appropriately with others with medication or extra time (anti-anxiety, antidepressant).  Problem Solving Problem Solving: 5-Solves complex 90% of the time/cues < 10% of the time  Memory Memory: 5-Recognizes or recalls 90% of the time/requires cueing < 10% of the time   Medical Problem List and Plan:  1. DVT Prophylaxis/Anticoagulation: Mechanical: Sequential compression devices, below knee Bilateral lower extremities  2. Pain Management: reasonable on prn medications.  3. Mood: motivated. Monitor for now.  4. Neuropsych: This patient is capable of making decisions on his/her own behalf.  5. HTN: BP has been variable. Monitor with bid checks. Resume BB at lower dose to avoid rebound tachycardia. Resume low dose amlodipine, monitor orthostatics 6. CAD: monitor for symptoms. Resume low dose toprol and zocor.  7. Dysesthesias: continue low dose neurontin and titrate as needed.  8. Constipation: start senna S, supp today if no BM    LOS (Days) 2 A FACE TO FACE EVALUATION WAS PERFORMED  Tahmid Stonehocker E 03/17/2012, 7:21 AM

## 2012-03-17 NOTE — Plan of Care (Signed)
Problem: RH BOWEL ELIMINATION Goal: RH STG MANAGE BOWEL WITH ASSISTANCE STG Manage Bowel with mod Assistance.  Outcome: Not Progressing LBM 03/10/12 per patient

## 2012-03-17 NOTE — Progress Notes (Signed)
Occupational Therapy Session Note  Patient Details  Name: Edward [name] MRN: [id number] Date of Birth: 1950/12/02  Today's Date: 03/17/2012 Time: 2841-3244 Time Calculation (min): 45 min  Short Term Goals: Week 1:  OT Short Term Goal 1 (Week 1): Patient will complete functional transfers on/off toilet with min assist (steadying) OT Short Term Goal 2 (Week 1): Patient will complete upper body dressing with modified independence OT Short Term Goal 3 (Week 1): Patient will complete lower body dressing with min assist OT Short Term Goal 4 (Week 1): Patient will demo ability to complete functional hand exercises with supervision. OT Short Term Goal 5 (Week 1): Patient will complete bathing, sitting and standing, with modified independence  Skilled Therapeutic Intervention: Therapeutic activities with emphasis on general conditioning, 15 min for LE strengthening using NuStep, level 3-4, 5 min using SCIFIT (acewrap placed in pt's w/c bag), and 20 min patient/family ed on use of assistive devices for ADL and self-feeding. OT provided bath mitt and loaner universal cuff this date (from [employer] cart).   Advised patient to purchase cuff as advised by IR staff. Patient reported exertion as 11 on BORG scale.  Spouse available for training on use of devices and encouraged to prompt patient to self-feed. Recommend to [employer] for reinforcement of self-feeding skills.     Therapy Documentation Precautions:  Precautions Precautions: Cervical;Fall Precaution Comments: s/p ACDF and has weakness in B UE's and  Required Braces or Orthoses: Cervical Brace Cervical Brace: Hard collar Restrictions Weight Bearing Restrictions: No  Pain: Pain Assessment Pain Assessment: 0-10 Pain Score:   7 ("getting better") Pain Type: Neuropathic pain Pain Location: Hand Pain Orientation: Right;Left Pain Descriptors: Tingling;Aching Pain Frequency: Intermittent Pain Onset: On-going Pain  Intervention(s): Medication (See eMAR)  See FIM for current functional status  Therapy/Group: Individual Therapy  Georgeanne Nim 03/17/2012, 4:09 PM

## 2012-03-17 NOTE — Plan of Care (Signed)
Overall Plan of Care [facility]) Patient Details Name: Edward [name] MRN: [id number] DOB: 10/28/1950  Diagnosis: Cervical central cord syndrome   Primary Diagnosis:    <principal problem not specified> Co-morbidities: htn, cad, pain, neurogenic bowel  Functional Problem List  Patient demonstrates impairments in the following areas: Balance, Motor and Safety  Basic ADL's: eating, grooming, bathing, dressing and toileting Advanced ADL's: simple meal preparation  Transfers:  bed mobility, bed to chair, toilet, tub/shower, car, furniture and floor Locomotion:  ambulation and stairs  Additional Impairments:  Leisure Awareness  Anticipated Outcomes Item Anticipated Outcome  Eating/Swallowing    Basic self-care  Modified independent  Tolieting  Modified independent  Bowel/Bladder    Transfers  Modified independent  Locomotion    Communication    Cognition    Pain    Safety/Judgment  supervision  Other     Therapy Plan: PT Frequency: 1-2 X/day, 60-90 minutes OT Frequency: 1-2 X/day, 60-90 minutes     Team Interventions: Item RN PT OT SLP SW TR Other  Self Care/Advanced ADL Retraining   x      Neuromuscular Re-Education   x      Therapeutic Activities   x   x   UE/LE Strength Training/ROM   x   x   UE/LE Coordination Activities   x   x   Visual/Perceptual Remediation/Compensation         DME/Adaptive Equipment Instruction   x   x   Therapeutic Exercise   x   x   Balance/Vestibular Training   x   x   Patient/Family Education   x   x   Cognitive Remediation/Compensation         Functional Mobility Training      x   Ambulation/Gait Water quality scientist Reintegration      x   Dysphagia/Aspiration Film/video editor         Bladder Management         Bowel Management         Disease Management/Prevention          Pain Management   x      Medication Management         Skin Care/Wound Management         Splinting/Orthotics         Discharge Planning      x   Psychosocial Support      x                      Team Discharge Planning: Destination:  Home Projected Follow-up:  Outpatient Projected Equipment Needs:  Information systems manager Patient/family involved in discharge planning:  Yes  MD ELOS: 2-3 weeks Medical Rehab Prognosis:  Excellent Assessment: Pt admitted for CIR therapies with focus on self-care, rom, fxnl mobility, dexterity, adaptive equipment, pain control. Goals are set at mod I for basic mobility and self=care.

## 2012-03-18 ENCOUNTER — Inpatient Hospital Stay (HOSPITAL_COMMUNITY): Payer: Federal, State, Local not specified - PPO | Admitting: Occupational Therapy

## 2012-03-18 ENCOUNTER — Inpatient Hospital Stay (HOSPITAL_COMMUNITY): Payer: Federal, State, Local not specified - PPO | Admitting: *Deleted

## 2012-03-18 ENCOUNTER — Inpatient Hospital Stay (HOSPITAL_COMMUNITY): Payer: Federal, State, Local not specified - PPO

## 2012-03-18 DIAGNOSIS — G8250 Quadriplegia, unspecified: Secondary | ICD-10-CM

## 2012-03-18 DIAGNOSIS — I10 Essential (primary) hypertension: Secondary | ICD-10-CM

## 2012-03-18 DIAGNOSIS — IMO0002 Reserved for concepts with insufficient information to code with codable children: Secondary | ICD-10-CM

## 2012-03-18 DIAGNOSIS — G959 Disease of spinal cord, unspecified: Secondary | ICD-10-CM

## 2012-03-18 DIAGNOSIS — Z5189 Encounter for other specified aftercare: Secondary | ICD-10-CM

## 2012-03-18 LAB — CBC WITH DIFFERENTIAL/PLATELET
Basophils Absolute: 0 10*3/uL (ref 0.0–0.1)
Basophils Relative: 0 % (ref 0–1)
Eosinophils Absolute: 0.4 10*3/uL (ref 0.0–0.7)
Eosinophils Relative: 5 % (ref 0–5)
HCT: 39.8 % (ref 39.0–52.0)
Hemoglobin: 14.1 g/dL (ref 13.0–17.0)
Lymphocytes Relative: 25 % (ref 12–46)
Lymphs Abs: 2 10*3/uL (ref 0.7–4.0)
MCH: 31.8 pg (ref 26.0–34.0)
MCHC: 35.4 g/dL (ref 30.0–36.0)
MCV: 89.6 fL (ref 78.0–100.0)
Monocytes Absolute: 0.8 10*3/uL (ref 0.1–1.0)
Monocytes Relative: 11 % (ref 3–12)
Neutro Abs: 4.7 10*3/uL (ref 1.7–7.7)
Neutrophils Relative %: 60 % (ref 43–77)
Platelets: 227 10*3/uL (ref 150–400)
RBC: 4.44 MIL/uL (ref 4.22–5.81)
RDW: 12.3 % (ref 11.5–15.5)
WBC: 7.8 10*3/uL (ref 4.0–10.5)

## 2012-03-18 LAB — COMPREHENSIVE METABOLIC PANEL
ALT: 38 U/L (ref 0–53)
AST: 28 U/L (ref 0–37)
Albumin: 3.3 g/dL — ABNORMAL LOW (ref 3.5–5.2)
Alkaline Phosphatase: 51 U/L (ref 39–117)
BUN: 14 mg/dL (ref 6–23)
CO2: 26 mEq/L (ref 19–32)
Calcium: 8.9 mg/dL (ref 8.4–10.5)
Chloride: 101 mEq/L (ref 96–112)
Creatinine, Ser: 0.94 mg/dL (ref 0.50–1.35)
GFR calc Af Amer: >90 mL/min (ref >90)
GFR calc non Af Amer: 88 mL/min — ABNORMAL LOW (ref >90)
Glucose, Bld: 89 mg/dL (ref 70–99)
Potassium: 3.6 mEq/L (ref 3.5–5.1)
Sodium: 136 mEq/L (ref 135–145)
Total Bilirubin: 0.4 mg/dL (ref 0.3–1.2)
Total Protein: 6.7 g/dL (ref 6.0–8.3)

## 2012-03-18 NOTE — Progress Notes (Signed)
Occupational Therapy Session Note  Patient Details  Name: Edward [name] MRN: [id number] Date of Birth: 11-Sep-1950  Today's Date: 03/18/2012 Time: 8295-6213 Time Calculation (min): 45 min  Short Term Goals: Week 1:  OT Short Term Goal 1 (Week 1): Patient will complete functional transfers on/off toilet with min assist (steadying) OT Short Term Goal 2 (Week 1): Patient will complete upper body dressing with modified independence OT Short Term Goal 3 (Week 1): Patient will complete lower body dressing with min assist OT Short Term Goal 4 (Week 1): Patient will demo ability to complete functional hand exercises with supervision. OT Short Term Goal 5 (Week 1): Patient will complete bathing, sitting and standing, with modified independence  Skilled Therapeutic Interventions:  Patient in bed upon arrival and wife at bedside.  RUE ROM and resistance exercises, reviewed tenodesis grasp concept and patient practiced using the technique to pick up items and release them.  Patient practiced don and doff palm based U-cuff with D ring, needing min assist and vc initially.  Patient to practice use of tenodesis grasp with feeding self finger type foods and don/doff U-cuff.  Reviewed rehab process related to expectation of wearing street clothes during the day.  Patient reports difficulty using urinal or task of BM if wearing pant/shorts.  Reviewed that he can certainly wear hospital gown at night and BSC to be placed in is room this evening by rehab tech.  Patient and wife appreciative of explanation.   Therapy Documentation Precautions:  Precautions Precautions: Cervical;Fall Precaution Comments: s/p ACDF and has weakness in B UE's and  Required Braces or Orthoses: Cervical Brace Cervical Brace: Hard collar Restrictions Weight Bearing Restrictions: No Pain: No c/o pain  Therapy/Group: Individual Therapy and patient's wife present  [name] 03/18/2012, 3:30 PM

## 2012-03-18 NOTE — Evaluation (Signed)
Recreational Therapy Assessment and Plan  Patient Details  Name: Edward [name] MRN: [id number] Date of Birth: Apr 30, 1951 Today's Date: 03/18/2012  Rehab Potential: Good ELOS: 2-3 weeks   Assessment Clinical Impression: Problem List:  Patient Active Problem List   Diagnosis   .  Nasal bone fracture   .  CAD (coronary artery disease)    Past Medical History:  Past Medical History   Diagnosis  Date   .  Measles    .  Mumps    .  Chicken pox    .  Whooping cough    .  Pneumonia    .  Hypertension    .  Coronary artery disease      a. 1999 s/p MI with cath/PCI; b. 05/1999 Ex Cardiolite EF 68%, no ishcemia.   .  Dyslipidemia    .  Hypokalemia    .  History of tobacco abuse    .  Near syncope    .  Nasal bones, closed fracture      a. 02/2012 in setting of presyncope/fall   .  Injury of cervical spine      a. 02/2012 C4/5    Past Surgical History:  Past Surgical History   Procedure  Date   .  [place] tendon surgery  1989   .  Achilles tendon surgery  1989   .  Cardiac catheterization  06/28/1998     single vessel CAD involving the distal RCA/PTCA and stenting of the distal RCA//EF- 50-55%   .  Nm myoview ltd  05/17/2009     normal stress nuclear study/no evidence of ischemia/EF- 68%   .  Anterior cervical decomp/discectomy fusion  03/13/2012     Procedure: ANTERIOR CERVICAL DECOMPRESSION/DISCECTOMY FUSION 1 LEVEL; Surgeon: Maeola Harman, MD; Location: [facility]; Service: Neurosurgery; Laterality: N/A; Anterior Cervical Decompression/Fusion. Cervical four-five.   .  Closed reduction nasal fracture  03/13/2012     Procedure: CLOSED REDUCTION NASAL FRACTURE; Surgeon: Wayland Denis, DO; Location: [facility]; Service: Plastics; Laterality: N/A; Internal and external splinting of nasal fracture    Assessment & Plan  Clinical Impression:Edward [name] is a 61 y.o. male with history of CAD, 24 hour history of diarrhea who got dizzy with near syncope and fall on 03/10/12.  Patient with hypotension and complaints of pain in head and neck with tingling bilateral hands. MRI cervical spine revealed intraspinous edema C3-C7 with multilevel spondylosis most prominent at C4-C5 with edema/and or gliosis and cord compression. CT head negative for acute changes. CT maxillofacial With comminuted depressed bilateral nasal bone fractues with bowing of nasal septum to right. Drs Venetia Maxon and Sanger consulted for input. Cervical decompression recommended for central cord syndrome. Treated with steroid protocol. Cleared for surgery by Dr. Myrtis Ser. Patient underwent ACDF with PEEK cages C4/5 by Dr. Venetia Maxon and CR of nasal fracture by Dr. Kelly Splinter on 03/13/12. Post op continues with decreased coordination and motor planning BUE with persistent weakness in triceps and hand intrinsics with dysesthesias. BLE instability continues. Patient transferred to CIR on 03/15/2012 .   Patient presents with decreased activity tolerance, decreased functional mobility, decreased balance, decreased coordination, decreased functional use of BUEs limiting pt's independence with leisure/community pursuits.   Leisure History/Participation Premorbid leisure interest/current participation: Games - Cards;Sports - Baseball;Sports - Basketball;Sports - Exercise (Comment);Nature - Lawn care;Community - Travel (Comment);Community - Chief Operating Officer (like all sports, work out at gym) Other Leisure Interests: Television Leisure Participation Style: With Family/Friends Awareness of Community  Resources: Good-identify 3 post discharge leisure resources Psychosocial / Spiritual Does patient have pets?: Yes Social interaction - Mood/Behavior: Cooperative Film/video editor for Education?: Yes Patient Agreeable to Outing?: Yes Recreational Therapy Orientation Orientation -Reviewed with patient: Available activity resources Strengths/Weaknesses Patient Strengths/Abilities: Willingness to  participate;Active premorbidly Patient weaknesses: Physical limitations  Plan Rec Therapy Plan Is patient appropriate for Therapeutic Recreation?: Yes Rehab Potential: Good Treatment times per week: min 1 time per week >20 minutes Estimated Length of Stay: 2-3 weeks TR Treatment/Interventions: Adaptive equipment instruction;1:1 session;Balance/vestibular training;Community reintegration;Functional mobility training;Patient/family education;Therapeutic exercise;Recreation/leisure participation;Therapeutic activities;UE/LE Coordination activities  Recommendations for other services: None  Discharge Criteria: Patient will be discharged from TR if patient refuses treatment 3 consecutive times without medical reason.  If treatment goals not met, if there is a change in medical status, if patient makes no progress towards goals or if patient is discharged from hospital.  The above assessment, treatment plan, treatment alternatives and goals were discussed and mutually agreed upon: by patient  [name] 03/18/2012, 9:58 AM

## 2012-03-18 NOTE — Progress Notes (Signed)
Physical Therapy Session Note  Patient Details  Name: Edward [name] MRN: [id number] Date of Birth: 1950/11/07  Today's Date: 03/18/2012 Time: 1191-4782 Time Calculation (min): 55 min  Short Term Goals: Week 1:  PT Short Term Goal 1 (Week 1): Patient will be able to perform bed mobility with min-A PT Short Term Goal 2 (Week 1): Patient will be able to perform transfers with min-A PT Short Term Goal 3 (Week 1): Patient will be able to ambulate x 30' using LRAD with Mod-Assist x 2  Skilled Therapeutic Interventions/Progress Updates:  Tx focused on safe transfer training, functional LE strengthening, and gait training with +2 HHA.  Pt was able to perform supine>sit with S only, and Min A for bed>WC transfer for steadying due to bil LE weakness R>L. Pt adjusts technique well with cues for hand placement and efficiency.  Pt educated on [facility] management for brakes and propulsion, but unable to grip wheels without theraband. With theraband for grip, pt able to propel WC x20', limited by UE weakness and fatigue, needing Min A for steering.  WC>mat and sit<>stand with Min A and cues to scoot forward and use UEs as able.  Sitting edge of mat with S only: LAQ with 5 sec holds bil 2x10, marching x20, and ankle DF/PF x20. Serial sit<>stand x10 with focus on posterior translation as pt tends to lean anteriorly, likely in part due to decreased sensation in bil feet. Static standing 3x41min with Mod A to correct balance and cues to lift toes, and tactile cues to adjust posteriorly.  Standing in [place] walker, performed marching bil x10, squats, and heel/toe raises.  Gait training 1x12' in [place] walker with Max A for steadying and controlling device with close WC follow. Cues for tightening quads in stance.  Pt able to perform gait training 1x100' with +2 bil HHA, pt 75% with intermittent knee buckling, R>L and anterior lean.  Pt reporting 5/10 bil hand pain, nursing aware.        Therapy  Documentation Precautions:  Precautions Precautions: Cervical;Fall Precaution Comments: s/p ACDF and has weakness in B UE's and  Required Braces or Orthoses: Cervical Brace Cervical Brace: Hard collar Restrictions Weight Bearing Restrictions: No    Locomotion : Ambulation Ambulation/Gait Assistance: 1: +2 Total assist Wheelchair Mobility Distance: 20   See FIM for current functional status  Therapy/Group: Individual Therapy  Virl Cagey, PT 03/18/2012, 9:20 AM

## 2012-03-18 NOTE — Progress Notes (Signed)
Occupational Therapy Session Note  Patient Details  Name: Edward [name] MRN: [id number] Date of Birth: [birth year]  Today's Date: 03/18/2012 Time: 1010-1105 Time Calculation (min): 55 min  Short Term Goals: Week 1:  OT Short Term Goal 1 (Week 1): Patient will complete functional transfers on/off toilet with min assist (steadying) OT Short Term Goal 2 (Week 1): Patient will complete upper body dressing with modified independence OT Short Term Goal 3 (Week 1): Patient will complete lower body dressing with min assist OT Short Term Goal 4 (Week 1): Patient will demo ability to complete functional hand exercises with supervision. OT Short Term Goal 5 (Week 1): Patient will complete bathing, sitting and standing, with modified independence  Skilled Therapeutic Interventions/Progress Updates:  Pt scheduled for B/D with a focus on active use of UE with and without adaptive aids.  Pt was very insistent that he did not want to get dressed and keep shorts on as he was very worried about clothing management with toileting.  He used wash mit for bathing at sink and also tried grasping wash cloth with tenodesis support.  Toilet transfer to toilet with mod assist. Pt was impulsive with sitting back in wheelchair and only sat partially on chair.  Needed cues to readjust position.   BUE AROM with shoulder flexion and abd to 90, full pron/supin, elbow/ flexion/ extension. He worked on a/arom of wrist extensors.     Therapy Documentation Precautions:  Precautions Precautions: Cervical;Fall Precaution Comments: s/p ACDF and has weakness in B UE's and  Required Braces or Orthoses: Cervical Brace Cervical Brace: Hard collar Restrictions Weight Bearing Restrictions: No   Pain: Pain Assessment Pain Score:   3 Pain Type: Neuropathic pain Pain Location: Hand Pain Orientation: Right;Left Pain Descriptors: Pins and needles Pain Frequency: Intermittent Pain Onset: On-going Patients Stated Pain Goal:  2 Pain Intervention(s): Medication (See eMAR)  See FIM for current functional status  Therapy/Group: Individual Therapy  Cohen Doleman 03/18/2012, 12:16 PM
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
## 2012-03-18 NOTE — Progress Notes (Signed)
Patient ID: Edward [name], male   DOB: Oct 25, 1950, 61 y.o.   MRN: [id number] Patient ID: Edward [name], male   DOB: [birth year], 61 y.o.   MRN: [id number]  Subjective/Complaints: Mouth is dry. Hand pain is better.  Objective: Vital Signs: Blood pressure 133/91, pulse 72, temperature 97.9 F (36.6 C), temperature source Oral, resp. rate 18, weight 100 kg (220 lb 7.4 oz), SpO2 96.00%. No results found. No results found for this or any previous visit (from the past 72 hour(s)).    General: Alert and oriented x 3, No apparent distress HEENT: Head is normocephalic, atraumatic, PERRLA, EOMI, sclera anicteric, oral mucosa pink and moist, dentition intact, ext ear canals clear, nose dressed, sutures in place Neck: Supple without JVD or lymphadenopathy Heart: Reg rate and rhythm. No murmurs rubs or gallops Chest: CTA bilaterally without wheezes, rales, or rhonchi; no distress Abdomen: Soft, non-tender, non-distended, bowel sounds positive. Extremities: No clubbing, cyanosis, or edema. Pulses are 2+ Skin: Clean and intact without signs of breakdown Neuro: Pt is cognitively appropriate with normal insight, memory, and awareness. Cranial nerves 2-12 are intact. Sensory exam is 1/2 in hands and feet.  Biceps and deltoids are 3 to 3+. Triceps 2-3, WE and WF are 1-2. HI are trace to absent. Lower ext are 3-4/5.  Musculoskeletal: Full ROM, No pain with AROM or PROM in the neck, trunk, or extremities. Posture appropriate Psych: Pt's affect is appropriate. Pt is cooperative     Assessment/Plan: 1. Functional deficits secondary to Central cord syndrome after fall s/p ACDF 4-5 which require 3+ hours per day of interdisciplinary therapy in a comprehensive inpatient rehab setting. Physiatrist is providing close team supervision and 24 hour management of active medical problems listed below. Physiatrist and rehab team continue to assess barriers to discharge/monitor patient progress toward functional and  medical goals. FIM: FIM - Bathing Bathing Steps Patient Completed: Front perineal area (Patient just washed face at sink) Bathing: 5: Set-up assist to: Obtain items  FIM - Upper Body Dressing/Undressing Upper body dressing/undressing: 0: Wears gown/pajamas-no public clothing FIM - Lower Body Dressing/Undressing Lower body dressing/undressing: 0: Wears gown/pajamas-no public clothing  FIM - Toileting Toileting steps completed by patient: Performs perineal hygiene;Adjust clothing prior to toileting;Adjust clothing after toileting (Patient wearing a gown so can move out of the way to sit ) Toileting Assistive Devices: Grab bar or rail for support Toileting: 4: Steadying assist  FIM - Diplomatic Services operational officer Devices: Grab bars Toilet Transfers: 3-To toilet/BSC: Mod A (lift or lower assist);3-From toilet/BSC: Mod A (lift or lower assist)  FIM - Bed/Chair Transfer Bed/Chair Transfer Assistive Devices: Bed rails Bed/Chair Transfer: 3: Bed > Chair or W/C: Mod A (lift or lower assist);3: Chair or W/C > Bed: Mod A (lift or lower assist)  FIM - Locomotion: Wheelchair Locomotion: Wheelchair: 0: Activity did not occur (unable to grip with hands/fingers) FIM - Locomotion: Ambulation Locomotion: Ambulation Assistive Devices: Other (comment) (hand held assist x 3 feet) Ambulation/Gait Assistance: 2: Max assist  Comprehension Comprehension Mode: Auditory Comprehension: 6-Follows complex conversation/direction: With extra time/assistive device  Expression Expression Mode: Verbal Expression: 6-Expresses complex ideas: With extra time/assistive device  Social Interaction Social Interaction: 7-Interacts appropriately with others - No medications needed.  Problem Solving Problem Solving: 5-Solves complex 90% of the time/cues < 10% of the time  Memory Memory: 5-Recognizes or recalls 90% of the time/requires cueing < 10% of the time   Medical Problem List and Plan:  1.  DVT Prophylaxis/Anticoagulation: Mechanical: Sequential compression  devices, below knee Bilateral lower extremities, movibility 2. Pain Management: reasonable on prn medications.  3. Mood: motivated. Monitor for now.  4. Neuropsych: This patient is capable of making decisions on his/her own behalf.  5. HTN: BP has been variable. Monitor with bid checks. Resume BB at lower dose to avoid rebound tachycardia. Resume low dose amlodipine, monitor orthostatics 6. CAD: monitor for symptoms. Resume low dose toprol and zocor.  7. Dysesthesias: neurontin seems to be helping. Continue current dosing for now..  8. Constipation: start senna S, moving bowels now   LOS (Days) 3 A FACE TO FACE EVALUATION WAS PERFORMED  SWARTZ,ZACHARY T 03/18/2012, 8:24 AM

## 2012-03-18 NOTE — Progress Notes (Signed)
Patient information reviewed and entered into [facility] system by Little Bashore, RN, CRRN, PPS Coordinator.  Information including medical coding and functional independence measure will be reviewed and updated through discharge.    

## 2012-03-18 NOTE — Progress Notes (Signed)
Physical Therapy Session Note  Patient Details  Name: Edward [name] MRN: [id number] Date of Birth: 03-Dec-1950  Today's Date: 03/18/2012 Time: 3086-5784 Time Calculation (min): 30 min  Short Term Goals: Week 1:  PT Short Term Goal 1 (Week 1): Patient will be able to perform bed mobility with min-A PT Short Term Goal 2 (Week 1): Patient will be able to perform transfers with min-A PT Short Term Goal 3 (Week 1): Patient will be able to ambulate x 30' using LRAD with Mod-Assist x 2  Skilled Therapeutic Interventions/Progress Updates:    Pt in supine, assisted with putting pants on EOB and min A stand pivot transfer to w/c. W/c propulsion on unit for endurance and UE strengthening to gym with min A needed for turning. Standing balance activity to work on standing balance and UE function with Ring Arc for ROM and fine motor with R and L UE. Required min A overall for balance and pt tending to sink down after a few seconds needing tactile and verbal cueing for posture.   Therapy Documentation Precautions:  Precautions Precautions: Cervical;Fall Precaution Comments: s/p ACDF and has weakness in B UE's and  Required Braces or Orthoses: Cervical Brace Cervical Brace: Hard collar Restrictions Weight Bearing Restrictions: No    Pain: Denies pain - discomfort in hands: premedicated.  See FIM for current functional status  Therapy/Group: Individual Therapy  Karolee Stamps [facility] 03/18/2012, 4:22 PM

## 2012-03-18 NOTE — Care Management Note (Signed)
[facility]patient [facility] Individual Statement of Services  Patient Name:  Edward [name]  Date:  03/18/2012  Welcome to the [facility].  Our goal is to provide you with an individualized program based on your diagnosis and situation, designed to meet your specific needs.  With this comprehensive rehabilitation program, you will be expected to participate in at least 3 hours of rehabilitation therapies Monday-Friday, with modified therapy programming on the weekends.  Your rehabilitation program will include the following services:  Physical Therapy (PT), Occupational Therapy (OT), 24 hour per day rehabilitation nursing, Therapeutic Recreaction (TR), Case Management (RN and Child psychotherapist), Rehabilitation Medicine, Nutrition Services and Pharmacy Services  Weekly team conferences will be held on Tuesday to discuss your progress.  Your RN Case Designer, television/film set will talk with you frequently to get your input and to update you on team discussions.  Team conferences with you and your family in attendance may also be held.  Expected length of stay: 2-3 weeks Overall anticipated outcome: supervision-min level  Depending on your progress and recovery, your program may change.  Your RN Case Estate agent will coordinate services and will keep you informed of any changes.  Your RN Sports coach and SW names and contact numbers are listed  below.  The following services may also be recommended but are not provided by the [facility]:   Driving Evaluations  Home Health Rehabiltiation Services  Outpatient Rehabilitatation [facility]  Vocational Rehabilitation   Arrangements will be made to provide these services after discharge if needed.  Arrangements include referral to agencies that provide these services.  Your insurance has been verified to be:  FED BCBS Your primary doctor is:  Dr.Donna Kevan Ny  Pertinent information will be  shared with your doctor and your insurance company.   Social Worker:  Dossie Der, [place] [phone]  Information discussed with and copy given to patient by: Lucy Chris, 03/18/2012, 9:19 AM

## 2012-03-19 ENCOUNTER — Inpatient Hospital Stay (HOSPITAL_COMMUNITY): Payer: Federal, State, Local not specified - PPO

## 2012-03-19 ENCOUNTER — Inpatient Hospital Stay (HOSPITAL_COMMUNITY): Payer: Federal, State, Local not specified - PPO | Admitting: Occupational Therapy

## 2012-03-19 MED ORDER — GABAPENTIN 100 MG PO CAPS
200.0000 mg | ORAL_CAPSULE | Freq: Three times a day (TID) | ORAL | Status: DC
  Administered 2012-03-19 – 2012-04-03 (×45): 200 mg via ORAL
  Filled 2012-03-19 (×50): qty 2

## 2012-03-19 MED ORDER — POLYETHYLENE GLYCOL 3350 17 G PO PACK
17.0000 g | PACK | Freq: Every day | ORAL | Status: DC
  Administered 2012-03-19 – 2012-03-20 (×2): 17 g via ORAL
  Filled 2012-03-19 (×3): qty 1

## 2012-03-19 MED ORDER — METOPROLOL TARTRATE 25 MG PO TABS
25.0000 mg | ORAL_TABLET | Freq: Two times a day (BID) | ORAL | Status: DC
  Administered 2012-03-19 – 2012-04-03 (×29): 25 mg via ORAL
  Filled 2012-03-19 (×35): qty 1

## 2012-03-19 NOTE — Progress Notes (Signed)
Physical Therapy Session Note  Patient Details  Name: Edward [name] MRN: [id number] Date of Birth: [birth year]  Today's Date: 03/19/2012 Time: 1478-2956 Time Calculation (min): 31 min  Short Term Goals: Week 1:  PT Short Term Goal 1 (Week 1): Patient will be able to perform bed mobility with min-A PT Short Term Goal 2 (Week 1): Patient will be able to perform transfers with min-A PT Short Term Goal 3 (Week 1): Patient will be able to ambulate x 30' using LRAD with Mod-Assist x 2  Skilled Therapeutic Interventions/Progress Updates:   Focus on stand pivot transfers with use of UEs to push up from w/c or seated surface and get erect posture before stepping with min A; pt tend to lean anterior and requires assist to maintain balance. Nustep for functional strengthening of UEs and LEs (hand grip on LUE) and overall endurance x 12 minutes on level 4. Therapist adjusted and set up bilateral PFRW to attempt with transfers and gait tomorrow instead of EVA walker for more control.   Therapy Documentation Precautions:  Precautions Precautions: Cervical;Fall Precaution Comments: s/p ACDF and has weakness in B UE's and  Required Braces or Orthoses: Cervical Brace Cervical Brace: Hard collar Restrictions Weight Bearing Restrictions: No  Pain No complaints of pain.  See FIM for current functional status  Therapy/Group: Individual Therapy  Karolee Stamps [facility] 03/19/2012, 3:43 PM

## 2012-03-19 NOTE — Progress Notes (Signed)
Occupational Therapy Session Note  Patient Details  Name: Edward [name] MRN: [id number] Date of Birth: [birth year]  Today's Date: 03/19/2012 Time: 1300-1400 Time Calculation (min): 60 min  Short Term Goals: Week 1:  OT Short Term Goal 1 (Week 1): Patient will complete functional transfers on/off toilet with min assist (steadying) OT Short Term Goal 2 (Week 1): Patient will complete upper body dressing with modified independence OT Short Term Goal 3 (Week 1): Patient will complete lower body dressing with min assist OT Short Term Goal 4 (Week 1): Patient will demo ability to complete functional hand exercises with supervision. OT Short Term Goal 5 (Week 1): Patient will complete bathing, sitting and standing, with modified independence  Skilled Therapeutic Interventions/Progress Updates:  No complaints of pain Therapeutic activities focusing on stand pivot transfers, fine motor tasks using bilateral hands, gross motor tasks using bilateral hands, family education to wife regarding safety with shoulders and arm movements, bilateral LE strengthening exercises, trunk/core control, and overall activity tolerance/endurance. Patient's wife present throughout session with supportive attitude. Patient propelled self back to room with wife.   Precautions:  Precautions Precautions: Cervical;Fall Precaution Comments: s/p ACDF and has weakness in B UE's and  Required Braces or Orthoses: Cervical Brace Cervical Brace: Hard collar Restrictions Weight Bearing Restrictions: No  See FIM for current functional status  Therapy/Group: Individual Therapy  Jamee Keach 03/19/2012, 2:10 PM

## 2012-03-19 NOTE — Progress Notes (Signed)
Social Work Patient ID: Edward [name], male   DOB: Feb 20, 1951, 61 y.o.   MRN: [id number] Met with pt, wife and daughter to inform of team conference goals-supervision/min level and discharge 9/6. Pt is pleased with his progress thus far but feels he has a long way to go.  Wife is here daily and observes in therapies. Aware may need to stay on first floor if not able to negoiatate steps.  He wants to be out of all of the commotion.   Continue to work toward discharge and progress.

## 2012-03-19 NOTE — Progress Notes (Signed)
Subjective/Complaints: Hands are both hurting this am. Complains of constipation also A 12 point review of systems has been performed and if not noted above is otherwise negative.   Objective: Vital Signs: Blood pressure 151/92, pulse 83, temperature 99.2 F (37.3 C), temperature source Oral, resp. rate 18, weight 100 kg (220 lb 7.4 oz), SpO2 98.00%. No results found. Results for orders placed during the hospital encounter of 03/15/12 (from the past 72 hour(s))  CBC WITH DIFFERENTIAL     Status: Normal   Collection Time   03/18/12 10:00 AM      Component Value Range Comment   WBC 7.8  4.0 - 10.5 K/uL    RBC 4.44  4.22 - 5.81 MIL/uL    Hemoglobin 14.1  13.0 - 17.0 g/dL    HCT 16.1  09.6 - 04.5 %    MCV 89.6  78.0 - 100.0 fL    MCH 31.8  26.0 - 34.0 pg    MCHC 35.4  30.0 - 36.0 g/dL    RDW 40.9  81.1 - 91.4 %    Platelets 227  150 - 400 K/uL    Neutrophils Relative 60  43 - 77 %    Neutro Abs 4.7  1.7 - 7.7 K/uL    Lymphocytes Relative 25  12 - 46 %    Lymphs Abs 2.0  0.7 - 4.0 K/uL    Monocytes Relative 11  3 - 12 %    Monocytes Absolute 0.8  0.1 - 1.0 K/uL    Eosinophils Relative 5  0 - 5 %    Eosinophils Absolute 0.4  0.0 - 0.7 K/uL    Basophils Relative 0  0 - 1 %    Basophils Absolute 0.0  0.0 - 0.1 K/uL   COMPREHENSIVE METABOLIC PANEL     Status: Abnormal   Collection Time   03/18/12 10:00 AM      Component Value Range Comment   Sodium 136  135 - 145 mEq/L    Potassium 3.6  3.5 - 5.1 mEq/L    Chloride 101  96 - 112 mEq/L    CO2 26  19 - 32 mEq/L    Glucose, Bld 89  70 - 99 mg/dL    BUN 14  6 - 23 mg/dL    Creatinine, Ser 7.82  0.50 - 1.35 mg/dL    Calcium 8.9  8.4 - 95.6 mg/dL    Total Protein 6.7  6.0 - 8.3 g/dL    Albumin 3.3 (*) 3.5 - 5.2 g/dL    AST 28  0 - 37 U/L    ALT 38  0 - 53 U/L    Alkaline Phosphatase 51  39 - 117 U/L    Total Bilirubin 0.4  0.3 - 1.2 mg/dL    GFR calc non Af Amer 88 (*) >90 mL/min    GFR calc Af Amer >90  >90 mL/min        General: Alert and oriented x 3, No apparent distress HEENT: Head is normocephalic, atraumatic, PERRLA, EOMI, sclera anicteric, oral mucosa pink and moist, dentition intact, ext ear canals clear, nose dressed, sutures in place Neck: Supple without JVD or lymphadenopathy Heart: Reg rate and rhythm. No murmurs rubs or gallops Chest: CTA bilaterally without wheezes, rales, or rhonchi; no distress Abdomen: Soft, non-tender, non-distended, bowel sounds positive. Extremities: No clubbing, cyanosis, or edema. Pulses are 2+ Skin: Clean and intact without signs of breakdown Neuro: Pt is cognitively appropriate with normal insight, memory, and  awareness. Cranial nerves 2-12 are intact. Sensory exam is 1/2 in hands and feet.  Biceps and deltoids are 3 to 3+. Triceps 2-3, WE and WF are 1-2. HI are trace to absent. Lower ext are 3-4/5.  Musculoskeletal: Full ROM, No pain with AROM or PROM in the neck, trunk, or extremities. Posture appropriate Psych: Pt's affect is appropriate. Pt is cooperative     Assessment/Plan: 1. Functional deficits secondary to Central cord syndrome after fall s/p ACDF 4-5 which require 3+ hours per day of interdisciplinary therapy in a comprehensive inpatient rehab setting. Physiatrist is providing close team supervision and 24 hour management of active medical problems listed below. Physiatrist and rehab team continue to assess barriers to discharge/monitor patient progress toward functional and medical goals. FIM: FIM - Bathing Bathing Steps Patient Completed: Chest;Abdomen;Front perineal area;Left upper leg;Right upper leg (used wash mit) Bathing: 3: Mod-Patient completes 5-7 11f 10 parts or 50-74%  FIM - Upper Body Dressing/Undressing Upper body dressing/undressing: 0: Wears gown/pajamas-no public clothing FIM - Lower Body Dressing/Undressing Lower body dressing/undressing: 0: Wears gown/pajamas-no public clothing  FIM - Toileting Toileting steps completed by  patient: Performs perineal hygiene;Adjust clothing prior to toileting;Adjust clothing after toileting (Patient wearing a gown so can move out of the way to sit ) Toileting Assistive Devices: Grab bar or rail for support Toileting: 0: Activity did not occur  FIM - Diplomatic Services operational officer Devices: Grab bars Toilet Transfers: 3-To toilet/BSC: Mod A (lift or lower assist);3-From toilet/BSC: Mod A (lift or lower assist)  FIM - Bed/Chair Transfer Bed/Chair Transfer Assistive Devices: Bed rails;Arm rests Bed/Chair Transfer: 3: Chair or W/C > Bed: Mod A (lift or lower assist)  FIM - Locomotion: Wheelchair Distance: 20 Locomotion: Wheelchair: 1: Travels less than 50 ft with minimal assistance (Pt.>75%) FIM - Locomotion: Ambulation Locomotion: Ambulation Assistive Devices: Other (comment) (HHA) Ambulation/Gait Assistance: 1: +2 Total assist (pt 75%) Locomotion: Ambulation: 1: Two helpers (100')  Comprehension Comprehension Mode: Auditory Comprehension: 6-Follows complex conversation/direction: With extra time/assistive device  Expression Expression Mode: Verbal Expression: 6-Expresses complex ideas: With extra time/assistive device  Social Interaction Social Interaction: 7-Interacts appropriately with others - No medications needed.  Problem Solving Problem Solving: 6-Solves complex problems: With extra time  Memory Memory: 6-More than reasonable amt of time   Medical Problem List and Plan:  1. DVT Prophylaxis/Anticoagulation: Mechanical: Sequential compression devices, below knee Bilateral lower extremities, movibility 2. Pain Management: reasonable on prn medications.  3. Mood: motivated. Monitor for now.  4. Neuropsych: This patient is capable of making decisions on his/her own behalf.  5. HTN: BP has been variable. Monitor with bid checks. Resumed BB at lower dose to avoid rebound tachycardia--will increase further today. Resumed low dose amlodipine, monitor  orthostatics 6. CAD: monitor for symptoms. Resume low dose toprol and zocor.  7. Dysesthesias: neurontin seems to be helping-will further titrate today..  8. Constipation: started senna S,  Schedule miralax    LOS (Days) 4 A FACE TO FACE EVALUATION WAS PERFORMED  SWARTZ,ZACHARY T 03/19/2012, 7:50 AM

## 2012-03-19 NOTE — Patient Care Conference (Signed)
Inpatient RehabilitationTeam Conference Note Date: 03/19/2012   Time: 2:00PM    Patient Name: Edward [name]      Medical Record Number: [id number]  Date of Birth: 02/14/1951 Sex: Male         Room/Bed: 4007/4007-01 Payor Info: Payor: BLUE CROSS BLUE SHIELD  Plan: BCBS/[employer] PPO  Product Type: *No Product type*     Admitting Diagnosis: CENTRAL CORD SYNDROME  Admit Date/Time:  03/15/2012  6:46 PM Admission Comments: No comment available   Primary Diagnosis:  SCI (spinal cord injury) Principal Problem: SCI (spinal cord injury)  Patient Active Problem List   Diagnosis Date Noted  . Cervical myelopathy 03/18/2012  . SCI (spinal cord injury) 03/18/2012  . HTN (hypertension) 03/18/2012  . Nasal bone fracture 03/11/2012  . CAD (coronary artery disease) 03/11/2012    Expected Discharge Date: Expected Discharge Date: 04/05/12  Team Members Present: Physician: Dr. Faith Rogue Social Worker Present: Dossie Der, LCSW Nurse Present: Other (comment) Kennon Portela, RN) PT Present: Karolee Stamps, PT OT Present: Bretta Bang, OT;Patricia Mat Carne, OT     Current Status/Progress Goal Weekly Team Focus  Medical   cervical myelopathy with central cord picture  pain mgt, education  adjusting pain mgt, working on postural control   Bowel/Bladder   continent of bowel and bladder  No incontinent episodes      Swallow/Nutrition/ Hydration             ADL's   min A/mod A overall  supervision/mod I overall  BUE use, compensatory strategies, transfers   Mobility   min A transfers, +2 gait, min A w/c mobility   supervision/min A overall  gait training, functional strengthening, dynamic standing balance, activity tolerance   Communication             Safety/Cognition/ Behavioral Observations  No unsafe behaviors  remain free from fall/injury      Pain   Pain controlled with oxy 10 mg q 3hrs  Keep pain level at <3  assess pain level q shift   Skin   Incision to neck with  dermabond OTA  No new skin break down         *See Interdisciplinary Assessment and Plan and progress notes for long and short-term goals  Barriers to Discharge: neuro deficits, stairs in home, "buying in" to therapy, DJD of hips, knees?    Possible Resolutions to Barriers:  education, NMR, family ed    Discharge Planning/Teaching Needs:  Home with wife who can provide limited assist, due to back issues.  Has grown children willing to help also.      Team Discussion:  Pt progressing well, knee and hip issues from prior to admission.  Main goal is for pt to go up flight of stairs to stay in bedroom at home. MD managing pt's pain.  Hand pain bothering pt.  Revisions to Treatment Plan:  None   Continued Need for Acute Rehabilitation Level of Care: The patient requires daily medical management by a physician with specialized training in physical medicine and rehabilitation for the following conditions: Daily direction of a multidisciplinary physical rehabilitation program to ensure safe treatment while eliciting the highest outcome that is of practical value to the patient.: Yes Daily medical management of patient stability for increased activity during participation in an intensive rehabilitation regime.: Yes Daily analysis of laboratory values and/or radiology reports with any subsequent need for medication adjustment of medical intervention for : Post surgical problems;Neurological problems  [name], [name] 03/19/2012, 2:22  PM  

## 2012-03-19 NOTE — Progress Notes (Signed)
Physical Therapy Session Note  Patient Details  Name: Edward [name] MRN: [id number] Date of Birth: [birth year]  Today's Date: 03/19/2012 Time: 9629-5284 Time Calculation (min): 45 min  Short Term Goals: Week 1:  PT Short Term Goal 1 (Week 1): Patient will be able to perform bed mobility with min-A PT Short Term Goal 2 (Week 1): Patient will be able to perform transfers with min-A PT Short Term Goal 3 (Week 1): Patient will be able to ambulate x 30' using LRAD with Mod-Assist x 2  Skilled Therapeutic Interventions/Progress Updates:    Discussed home situation with patient and has multiple steps regardless of entrance and would like to be able to go up to second floor where bedroom is because pt reports first floor is "too busy." Practiced up/down 3 steps with bilateral rails with max A, after 3rd step demonstrate improved posture but initially requiring cueing for posture and mod/max A needed to keep upright and go up step due to decreased ability to use Bilateral UEs and decreased strength in LEs. Discussed possibility of putting a chair on landing on steps at home for rest breaks. Gait with Carley Hammed walker, requiring mod/max A due to assist needed for steering Carley Hammed and maintaining balance. Pt continues to tend to "sink down" and requires manual facilitation and verbal cueing for posture. W/c propulsion on unit with bilateral UEs and LEs for mobility, functional use of UEs, and endurance. Returned to bed at end of session with min A to rest.  Therapy Documentation Precautions:  Precautions Precautions: Cervical;Fall Precaution Comments: s/p ACDF and has weakness in B UE's and  Required Braces or Orthoses: Cervical Brace Cervical Brace: Hard collar Restrictions Weight Bearing Restrictions: No   Pain C/o discomfort in hands - premedicated. Reports not sleeping well at all last night.  Locomotion : Ambulation Ambulation/Gait Assistance: 2: Max assist   See FIM for current functional  status  Therapy/Group: Individual Therapy  Karolee Stamps [facility] 03/19/2012, 9:45 AM

## 2012-03-19 NOTE — Discharge Summary (Signed)
Physician Discharge Summary  Patient ID: Edward [name] MRN: [id number] DOB/AGE: 62-Dec-1952 62 y.o.  Admit date: 03/10/2012 Discharge date: 03/15/2012  Admission Diagnoses: Spinal Cord Injury with central cord injury C4/5 with HNP, spondylosis, stenosis; Nasal fracture   Discharge Diagnoses: Spinal Cord Injury with central cord injury C4/5 with HNP, spondylosis, stenosis; nasal fracture; S/P Nasal fracture reduction (closed) and ANTERIOR CERVICAL DECOMPRESSION/DISCECTOMY FUSION 1 LEVEL C4/5 with PEEK cage, autograft and allograft, anterior cervical plate   Principal Problem:  *SCI (spinal cord injury) Active Problems:  Nasal bone fracture  Cervical myelopathy  HTN (hypertension)   Discharged Condition: fair  Hospital Course: Edward [name] was admitted through the Emergency Department after a fall in his home requiring EMS transport.   Bilateral upper extremity weakness from central cervical cord injury required surgical repair. After admission, Decadron to reduce intraspinal pressures, and cardiac clearance, pt underwent anterior cervical decompression and fusion at C4-5 by Dr. Venetia Maxon.  During the same operative time, his nasal fracture was reduced and splinted by Dr. Kelly Splinter. Following uncomplicated surgery and recovery in [facility], pt transferred to [facility] for nursing and therapy care. He progressed well, understanding lengthy rehabilitation will be required to maximize his recovery. He was discharged to CIR.  Consults: cardiology, rehabilitation medicine and plastic surgery  Significant Diagnostic Studies: radiology: X-Ray: intra-operative  Treatments: surgery: ANTERIOR CERVICAL DECOMPRESSION/DISCECTOMY FUSION 1 LEVEL C4/5 with PEEK cage, autograft and allograft, anterior cervical plate (N/A) CLOSED REDUCTION NASAL FRACTURE    Discharge Exam: [Late entry - VS from 03-15-12 visit: Temp:  [97.4 F (36.3 C)-98.8 F (37.1 C)] 97.6 F (36.4 C) (08/16 0602) Pulse Rate:  [61-68] 66   (08/16 0602) Resp:  [8-18] 18  (08/16 0602) BP: (143-174)/(86-100) 143/98 mmHg (08/16 0602) SpO2:  [98 %-100 %] 98 % (08/16 0602)  Intake/Output from previous day: 08/15 0701 - 08/16 0700 In: 150 [I.V.:150] Out: 150 [Urine:150] Intake/Output this shift:   Alert, conversant. Good strength BLE. Weakness bilat triceps persists, some improvement AROM (gross motor only)both wrists/hands. Dermabond intact cervical incision. No erythema, swelling, or drainage. Nasal splints intact.     Disposition: 62-Rehab Facility  Discharge Orders    Future Appointments: Provider: Department: Dept Phone: Center:   03/19/2012 1:00 PM Coralyn Mark, OT [facility] [phone] None   03/19/2012 2:45 PM Philip Aspen, PT [facility] [phone] None     Medication List  As of 03/19/2012 12:08 PM   ASK your doctor about these medications         amLODipine 5 MG tablet   Commonly known as: NORVASC   Take 5 mg by mouth daily.      aspirin 81 MG tablet   Take 81 mg by mouth daily.      fish oil-omega-3 fatty acids 1000 MG capsule   Take 3 g by mouth daily. Take three tablets by mouth once daily      lisinopril-hydrochlorothiazide 20-25 MG per tablet   Commonly known as: PRINZIDE,ZESTORETIC   Take 1 tablet by mouth daily.      metoprolol succinate 50 MG 24 hr tablet   Commonly known as: TOPROL-XL   Take 50 mg by mouth daily. Take with or immediately following a meal.      multivitamin tablet   Take 1 tablet by mouth daily.      nitroGLYCERIN 0.4 MG SL tablet   Commonly known as: NITROSTAT   place 1 tablet under the tongue if needed for chest pain  potassium chloride 10 MEQ tablet   Commonly known as: K-DUR,KLOR-CON   Take 10 mEq by mouth daily.      simvastatin 80 MG tablet   Commonly known as: ZOCOR   Take 80 mg by mouth at bedtime.             Signed: Georgiann Cocker 03/19/2012, 12:08 PM

## 2012-03-19 NOTE — Discharge Summary (Deleted)
Physician Discharge Summary  Patient ID: Edward [name] MRN: [id number] DOB/AGE: [birth year] 62 y.o.  Admit date: 03/15/2012 Discharge date: 03/19/2012  Admission Diagnoses: Spinal Cord Injury with central cord injury C4/5 with HNP, spondylosis, stenosis; Nasal fracture   Discharge Diagnoses: Spinal Cord Injury with central cord injury C4/5 with HNP, spondylosis, stenosis; nasal fracture; S/P Nasal fracture reduction (closed) and ANTERIOR CERVICAL DECOMPRESSION/DISCECTOMY FUSION 1 LEVEL C4/5 with PEEK cage, autograft and allograft, anterior cervical plate   Principal Problem:  *SCI (spinal cord injury) Active Problems:  Nasal bone fracture  Cervical myelopathy  HTN (hypertension)   Discharged Condition: fair  Hospital Course: Edward [name] was admitted through the Emergency Department after a fall in his home requiring EMS transport.   Bilateral upper extremity weakness from central cervical cord injury required surgical repair. After admission, Decadron to reduce intraspinal pressures, and cardiac clearance, pt underwent anterior cervical decompression and fusion at C4-5 by Dr. Venetia Maxon.  During the same operative time, his nasal fracture was reduced and splinted by Dr. Kelly Splinter. Following uncomplicated surgery and recovery in [facility], pt transferred to [facility] for nursing and therapy care. He progressed well, understanding lengthy rehabilitation will be required to maximize his recovery. He was discharged to CIR.  Consults: cardiology, rehabilitation medicine and plastic surgery  Significant Diagnostic Studies: radiology: X-Ray: intra-operative  Treatments: surgery: ANTERIOR CERVICAL DECOMPRESSION/DISCECTOMY FUSION 1 LEVEL C4/5 with PEEK cage, autograft and allograft, anterior cervical plate (N/A) CLOSED REDUCTION NASAL FRACTURE    Discharge Exam: [Late entry - VS from 03-15-12 visit: Temp:  [97.4 F (36.3 C)-98.8 F (37.1 C)] 97.6 F (36.4 C) (08/16 0602) Pulse Rate:  [61-68] 66   (08/16 0602) Resp:  [8-18] 18  (08/16 0602) BP: (143-174)/(86-100) 143/98 mmHg (08/16 0602) SpO2:  [98 %-100 %] 98 % (08/16 0602)  Intake/Output from previous day: 08/15 0701 - 08/16 0700 In: 150 [I.V.:150] Out: 150 [Urine:150] Intake/Output this shift:   Alert, conversant. Good strength BLE. Weakness bilat triceps persists, some improvement AROM (gross motor only)both wrists/hands. Dermabond intact cervical incision. No erythema, swelling, or drainage. Nasal splints intact.     Disposition: 62-Rehab Facility  Discharge Orders    Future Appointments: Provider: Department: Dept Phone: Center:   03/19/2012 1:00 PM Coralyn Mark, OT [facility] [phone] None   03/19/2012 2:45 PM Philip Aspen, PT [facility] [phone] None     Medication List  As of 03/19/2012 12:08 PM   ASK your doctor about these medications         amLODipine 5 MG tablet   Commonly known as: NORVASC   Take 5 mg by mouth daily.      aspirin 81 MG tablet   Take 81 mg by mouth daily.      fish oil-omega-3 fatty acids 1000 MG capsule   Take 3 g by mouth daily. Take three tablets by mouth once daily      lisinopril-hydrochlorothiazide 20-25 MG per tablet   Commonly known as: PRINZIDE,ZESTORETIC   Take 1 tablet by mouth daily.      metoprolol succinate 50 MG 24 hr tablet   Commonly known as: TOPROL-XL   Take 50 mg by mouth daily. Take with or immediately following a meal.      multivitamin tablet   Take 1 tablet by mouth daily.      nitroGLYCERIN 0.4 MG SL tablet   Commonly known as: NITROSTAT   place 1 tablet under the tongue if needed for chest pain  potassium chloride 10 MEQ tablet   Commonly known as: K-DUR,KLOR-CON   Take 10 mEq by mouth daily.      simvastatin 80 MG tablet   Commonly known as: ZOCOR   Take 80 mg by mouth at bedtime.             Signed: Georgiann Cocker 03/19/2012, 12:08 PM

## 2012-03-19 NOTE — Progress Notes (Signed)
Occupational Therapy Session Note  Patient Details  Name: Edward [name] MRN: [id number] Date of Birth: Feb 25, 1951  Today's Date: 03/19/2012 Time: 1100-1200 Time Calculation (min):  Short Term Goals: Week 1:  OT Short Term Goal 1 (Week 1): Patient will complete functional transfers on/off toilet with min assist (steadying) OT Short Term Goal 2 (Week 1): Patient will complete upper body dressing with modified independence OT Short Term Goal 3 (Week 1): Patient will complete lower body dressing with min assist OT Short Term Goal 4 (Week 1): Patient will demo ability to complete functional hand exercises with supervision. OT Short Term Goal 5 (Week 1): Patient will complete bathing, sitting and standing, with modified independence  Skilled Therapeutic Interventions/Progress Updates:    Pt engaged in bathing and dressing tasks w/c level at sink.  Pt transferred bed>w/c with stand pivot and min A.  Pt required assistance to don bath mitt to complete UB bathing.  Pt declined to perform LB bathing.  Pt required assistance placing toothbrush into universal cuff (pt donned cuff independently).  Pt brushed teeth without assistance.  Pt declined donning shirt preferring to wear hospital gown.  Pt issued cup with handle to increase independence with self feeding.  Pt practiced tenodesis with bilateral hands.  Pt exhibiting increased finger flexion with right hand.  Pt motivated and pleased with progress since admission.  Therapy Documentation Precautions:  Precautions Precautions: Cervical;Fall Precaution Comments: s/p ACDF and has weakness in B UE's and  Required Braces or Orthoses: Cervical Brace Cervical Brace: Hard collar Restrictions Weight Bearing Restrictions: No General:   Pain: Pain Assessment Pain Score:   6 Pain Type: Neuropathic pain Pain Location: Hand Pain Orientation: Right;Left Pain Descriptors: Pins and needles Pain Intervention(s): RN aware  See FIM for current  functional status  Therapy/Group: Individual Therapy  Rich Brave 03/19/2012, 3:59 PM

## 2012-03-20 ENCOUNTER — Inpatient Hospital Stay (HOSPITAL_COMMUNITY): Payer: Federal, State, Local not specified - PPO | Admitting: Physical Therapy

## 2012-03-20 ENCOUNTER — Encounter (HOSPITAL_COMMUNITY): Payer: Federal, State, Local not specified - PPO

## 2012-03-20 ENCOUNTER — Inpatient Hospital Stay (HOSPITAL_COMMUNITY): Payer: Federal, State, Local not specified - PPO

## 2012-03-20 DIAGNOSIS — G319 Degenerative disease of nervous system, unspecified: Secondary | ICD-10-CM

## 2012-03-20 MED ORDER — POLYETHYLENE GLYCOL 3350 17 G PO PACK
17.0000 g | PACK | Freq: Two times a day (BID) | ORAL | Status: DC
  Administered 2012-03-20 – 2012-04-03 (×26): 17 g via ORAL
  Filled 2012-03-20 (×32): qty 1

## 2012-03-20 NOTE — Progress Notes (Signed)
Subjective/Complaints: Still  Complains of constipation. Hands feel better A 12 point review of systems has been performed and if not noted above is otherwise negative.   Objective: Vital Signs: Blood pressure 133/83, pulse 88, temperature 98.7 F (37.1 C), temperature source Oral, resp. rate 18, weight 100 kg (220 lb 7.4 oz), SpO2 97.00%. No results found. Results for orders placed during the hospital encounter of 03/15/12 (from the past 72 hour(s))  CBC WITH DIFFERENTIAL     Status: Normal   Collection Time   03/18/12 10:00 AM      Component Value Range Comment   WBC 7.8  4.0 - 10.5 K/uL    RBC 4.44  4.22 - 5.81 MIL/uL    Hemoglobin 14.1  13.0 - 17.0 g/dL    HCT 16.1  09.6 - 04.5 %    MCV 89.6  78.0 - 100.0 fL    MCH 31.8  26.0 - 34.0 pg    MCHC 35.4  30.0 - 36.0 g/dL    RDW 40.9  81.1 - 91.4 %    Platelets 227  150 - 400 K/uL    Neutrophils Relative 60  43 - 77 %    Neutro Abs 4.7  1.7 - 7.7 K/uL    Lymphocytes Relative 25  12 - 46 %    Lymphs Abs 2.0  0.7 - 4.0 K/uL    Monocytes Relative 11  3 - 12 %    Monocytes Absolute 0.8  0.1 - 1.0 K/uL    Eosinophils Relative 5  0 - 5 %    Eosinophils Absolute 0.4  0.0 - 0.7 K/uL    Basophils Relative 0  0 - 1 %    Basophils Absolute 0.0  0.0 - 0.1 K/uL   COMPREHENSIVE METABOLIC PANEL     Status: Abnormal   Collection Time   03/18/12 10:00 AM      Component Value Range Comment   Sodium 136  135 - 145 mEq/L    Potassium 3.6  3.5 - 5.1 mEq/L    Chloride 101  96 - 112 mEq/L    CO2 26  19 - 32 mEq/L    Glucose, Bld 89  70 - 99 mg/dL    BUN 14  6 - 23 mg/dL    Creatinine, Ser 7.82  0.50 - 1.35 mg/dL    Calcium 8.9  8.4 - 95.6 mg/dL    Total Protein 6.7  6.0 - 8.3 g/dL    Albumin 3.3 (*) 3.5 - 5.2 g/dL    AST 28  0 - 37 U/L    ALT 38  0 - 53 U/L    Alkaline Phosphatase 51  39 - 117 U/L    Total Bilirubin 0.4  0.3 - 1.2 mg/dL    GFR calc non Af Amer 88 (*) >90 mL/min    GFR calc Af Amer >90  >90 mL/min       General: Alert  and oriented x 3, No apparent distress HEENT: Head is normocephalic, atraumatic, PERRLA, EOMI, sclera anicteric, oral mucosa pink and moist, dentition intact, ext ear canals clear, nose dressed, sutures in place Neck: Supple without JVD or lymphadenopathy Heart: Reg rate and rhythm. No murmurs rubs or gallops Chest: CTA bilaterally without wheezes, rales, or rhonchi; no distress Abdomen: Soft, non-tender, non-distended, bowel sounds positive. Extremities: No clubbing, cyanosis, or edema. Pulses are 2+ Skin: Clean and intact without signs of breakdown Neuro: Pt is cognitively appropriate with normal insight, memory, and awareness. Cranial  nerves 2-12 are intact. Sensory exam is 1/2 in hands and feet.  Biceps and deltoids are 3 to 3+. Triceps 2-3, WE and WF are 1-2. HI are trace to absent. Lower ext are 3-4/5.  Musculoskeletal: Full ROM, No pain with AROM or PROM in the neck, trunk, or extremities. Posture appropriate Psych: Pt's affect is appropriate. Pt is cooperative     Assessment/Plan: 1. Functional deficits secondary to Central cord syndrome after fall s/p ACDF 4-5 which require 3+ hours per day of interdisciplinary therapy in a comprehensive inpatient rehab setting. Physiatrist is providing close team supervision and 24 hour management of active medical problems listed below. Physiatrist and rehab team continue to assess barriers to discharge/monitor patient progress toward functional and medical goals.  Needs work on balance and trunk control.   FIM: FIM - Bathing Bathing Steps Patient Completed: Chest;Right Arm;Left Arm;Abdomen Bathing: 3: Mod-Patient completes 5-7 23f 10 parts or 50-74%  FIM - Upper Body Dressing/Undressing Upper body dressing/undressing: 0: Wears gown/pajamas-no public clothing FIM - Lower Body Dressing/Undressing Lower body dressing/undressing: 0: Activity did not occur  FIM - Toileting Toileting steps completed by patient: Performs perineal hygiene;Adjust  clothing prior to toileting;Adjust clothing after toileting (Patient wearing a gown so can move out of the way to sit ) Toileting Assistive Devices: Grab bar or rail for support Toileting: 0: Activity did not occur  FIM - Diplomatic Services operational officer Devices: Grab bars Toilet Transfers: 3-To toilet/BSC: Mod A (lift or lower assist);3-From toilet/BSC: Mod A (lift or lower assist)  FIM - Bed/Chair Transfer Bed/Chair Transfer Assistive Devices: Bed rails;Arm rests Bed/Chair Transfer: 5: Supine > Sit: Supervision (verbal cues/safety issues);4: Bed > Chair or W/C: Min A (steadying Pt. > 75%);4: Chair or W/C > Bed: Min A (steadying Pt. > 75%);5: Sit > Supine: Supervision (verbal cues/safety issues)  FIM - Locomotion: Wheelchair Distance: 20 Locomotion: Wheelchair: 2: Travels 50 - 149 ft with supervision, cueing or coaxing FIM - Locomotion: Ambulation Locomotion: Ambulation Assistive Devices: TEFL teacher (bilateral) Ambulation/Gait Assistance: 3: Mod assist;4: Min assist Locomotion: Ambulation: 2: Travels 50 - 149 ft with moderate assistance (Pt: 50 - 74%)  Comprehension Comprehension Mode: Auditory Comprehension: 6-Follows complex conversation/direction: With extra time/assistive device  Expression Expression Mode: Verbal Expression: 6-Expresses complex ideas: With extra time/assistive device  Social Interaction Social Interaction: 7-Interacts appropriately with others - No medications needed.  Problem Solving Problem Solving: 6-Solves complex problems: With extra time  Memory Memory: 6-More than reasonable amt of time   Medical Problem List and Plan:  1. DVT Prophylaxis/Anticoagulation: Mechanical: Sequential compression devices, below knee Bilateral lower extremities, movibility 2. Pain Management: neurontin change helped.  3. Mood: motivated. Monitor for now.  4. Neuropsych: This patient is capable of making decisions on his/her own behalf.  5. HTN: BP has  been variable. Monitor with bid checks. Resumed BB at lower dose to avoid rebound tachycardia--will increase further today. Resumed low dose amlodipine, monitor orthostatics 6. CAD: monitor for symptoms. Resume low dose toprol and zocor.  7. Dysesthesias: neurontin 200 tid 8. Constipation: started senna S,  Schedule miralax but increase to bid. Pt seems to be alitte fixated on this.   LOS (Days) 5 A FACE TO FACE EVALUATION WAS PERFORMED  Anjelita Sheahan T 03/20/2012, 10:00 AM

## 2012-03-20 NOTE — Progress Notes (Signed)
Physical Therapy Session Note  Patient Details  Name: Edward [name] MRN: [id number] Date of Birth: 01/22/1951  Today's Date: 03/20/2012 Time:  - 1000-1055 (55 minutes) individual    Short Term Goals: Week 1:  PT Short Term Goal 1 (Week 1): Patient will be able to perform bed mobility with min-A PT Short Term Goal 2 (Week 1): Patient will be able to perform transfers with min-A PT Short Term Goal 3 (Week 1): Patient will be able to ambulate x 30' using LRAD with Mod-Assist x 2  Skilled Therapeutic Interventions/Progress Updates: Therapeutic exercises for general bilateral LE strengthening ; gait training with appropriate assistive device     Therapy Documentation Precautions:  Precautions Precautions: Cervical;Fall Precaution Comments: s/p ACDF and has weakness in B UE's and  Required Braces or Orthoses: Cervical Brace Cervical Brace: Hard collar Restrictions Weight Bearing Restrictions: No General: Pt in wc upon arrival      Pain: Pain Assessment Pain Assessment: 0-10 Pain Score:   7 Pain Type: Acute pain Pain Location: Hand Pain Orientation: Left;Right Pain Descriptors: Pins and needles Pain Frequency: Intermittent Pain Onset: Gradual Patients Stated Pain Goal: 2 Pain Intervention(s): Medication (See eMAR) Mobility: Sit to stand min assist for balance   Locomotion : wc propulsion - 150 feet level using bilateral LEs SBA X 2; gait 50 feet X 2 bilateral PFRW min/mod assist with RT knee hyperextension in stance;    :       Exercises: Sit to stand 2 X 5 (blocking RT knee to prevent hyperextension) min assist   Other Treatments:  Nustep Level 4 X 10 minutes, level 5 X 5 minutes  1535-1625 (50 minutes) individual ([place] with RT) Pain: Pt reports unrated pain bilateral hands/premedicated Focus of treatment: Gait training with appropriate assistive device; standing tolerance for trunk/knee /hip control performing activity using bilateral UEs  Treatment:  Standing to table performing pipe tree X 10 minutes X 12 minutes min assist with mod vcs to facilitate bilateral hip extension . RT knee blocked to prevent hyperextension in stance.; gait gym to room with bilateral platform RW mod assist (steering) 120 feet followed by wc for safety. Hyperextension RT knee 90 % in stance.    See FIM for current functional status  Therapy/Group: Individual Therapy  Angellee Cohill,JIM 03/20/2012, 7:18 AM  1

## 2012-03-20 NOTE — Progress Notes (Signed)
Recreational Therapy Session Note  Patient Details  Name: GURLEY CLIMER MRN: [id number] Date of Birth: 1951/01/18 Today's Date: 03/20/2012 Time:  7829-5621 Pain: c/o pain in bilateral hands-premedicated Skilled Therapeutic Interventions/Progress Updates: Pt stood EOT working on "pipe tree" with focus on dynamic standing balance, bilateral knee control, functional use of BUE for 10 min x1 and then 12 min x1.  Ambulated with bilateral PFRW from gym to room with Mod assist  Therapy/Group: Co-Treatment  Level of assist: Min Assist  Chelcie Estorga 03/20/2012, 5:08 PM

## 2012-03-20 NOTE — Progress Notes (Signed)
Occupational Therapy Note  Patient Details  Name: Edward [name] MRN: [id number] Date of Birth: 1951/01/01 Today's Date: 03/20/2012  Pt missed 60 mins skilled OT services secondary complaint of nausea and vomiting.  Pt stated he was constipated and had been administered a laxative which made him nauseous.  Pt stated he couldn't do anything until he felt better.    Discussed with patient the importance of participating in therapy. [name] [facility] 03/20/2012, 7:56 AM

## 2012-03-20 NOTE — Progress Notes (Signed)
Physical Therapy Note  Patient Details  Name: Edward [name] MRN: [id number] Date of Birth: [birth year] Today's Date: 03/20/2012  13:00-13:30 individual therapy pt denied pain.   performed sit to stand at sink with min assist with vc for hand placement, performed squatting x 15 with education provided not to lock knees into hyper-extension, performed side steps in squat position x 3 each before LEs started to give away, LAQ with 2 #weight and shoulder flexion while squeezing ball x 20 each, performed isometric hip adduction and abduction with towel roll and belt x 10 each. Squat/stand trasfer min asssit.Marland Kitchen  Julian Reil 03/20/2012, 1:12 PM

## 2012-03-21 ENCOUNTER — Inpatient Hospital Stay (HOSPITAL_COMMUNITY): Payer: Federal, State, Local not specified - PPO | Admitting: Physical Therapy

## 2012-03-21 ENCOUNTER — Inpatient Hospital Stay (HOSPITAL_COMMUNITY): Payer: Federal, State, Local not specified - PPO | Admitting: *Deleted

## 2012-03-21 ENCOUNTER — Inpatient Hospital Stay (HOSPITAL_COMMUNITY): Payer: Federal, State, Local not specified - PPO

## 2012-03-21 MED ORDER — BISACODYL 10 MG RE SUPP
10.0000 mg | Freq: Once | RECTAL | Status: DC
  Filled 2012-03-21: qty 1

## 2012-03-21 NOTE — Consult Note (Signed)
NEUROCOGNITIVE TESTING - [facility]IDENTIAL [facility]   [name] is a 61 year old, African American man, who was seen for a brief neuropsychological evaluation to assess his cognitive functioning post fall with subsequent bilateral upper extremity weakness and bilateral tingling in his hands.  According to his medical record, he had an episode of near syncope with a fall on 03/10/2012.  He now has complaints of headache and neck pain with bilateral tingling in his hands.  An MRI of his cervical spine revealed intraspinous edema C3-C7 as well as multilevel spondylosis most prominent at C4-C5 with edema and/or gliosis and cord compression.  A CT of his head was unremarkable.  He was treated with steroid protocol for central cord syndrome and he also underwent surgery for nasal fracture.  He continues to endorse decreased coordination and motor planning with BUE weakness and BLE instability.  Owing to his reported headache and sensory changes, he was referred for brief neuropsychological evaluation to evaluate cognitive abilities and to assist in treatment planning.    PROCEDURES: [3 units of 16109 on 03/20/12]  The following tests were performed during today's visit: Repeatable Battery for the Assessment of Neuropsychological Status (RBANS, form A), Beck Anxiety Inventory, and the Beck Depression Inventory.  An objective measure of performance validity was also administered.  Test results are as follows:    RBANS Indices Scaled Score Percentile Description  Immediate Memory  49 < 1 Profoundly Impaired  Visuospatial/Constructional n/a n/a n/a  Language 85 16 Below Average  Attention n/a n/a n/a  Delayed Memory n/a n/a n/a  Total Score n/a n/a n/a    RBANS Subtests Raw Score Percentile Description  List Learning 12 < 1 Profoundly Impaired  Story Memory 6 < 1 Profoundly Impaired  Figure Copy Not administered n/a n/a  Line Orientation 5 < 1 Profoundly Impaired  Picture  Naming 10 73 Average  Semantic Fluency 12 2 Profoundly Impaired  Digit Span 7 6 Impaired  Coding Not administered n/a n/a  List Recall 2 3 Impaired  List Recognition 14 < 1 Profoundly Impaired  Story Recall 4 1 Profoundly Impaired  Figure recall Not administered n/a n/a    Beck Depression Inventory Raw Score = 18 Description = Mild    Beck Anxiety Inventory Raw Score = 26 Description = Severe   [name] performances on objective and embedded measures of performance validity were within expected limits.  Behaviorally, however, [name] was falling asleep during administration of certain tests.  Additionally, certain tasks were not able to be administered because [name] was unable to sit up and hold a pencil to write.  Therefore, test results should be interpreted cautiously as they may represent an underestimation of his true cognitive potential.    Test results revealed significant impairment across cognitive domains, with relative sparing of language abilities.  It is notable that he is taking multiple medications that have side effects of fatigue.  Additionally, he reported being in significant pain at the time of the evaluation.  As such, many, if not all of his cognitive deficits are likely related to pain and side effects from medications.  In addition, [name] responses to self-report measures of mood symptoms were suggestive of mild depression and severe anxiety, which are likely significantly contributing to, if not causing, cognitive difficulties.  As such, it is expected that when mood, pain level, and fatigue are resolved, that his cognitive functioning will drastically improve as well.    In light of these findings,  the following recommendations are provided and were discussed with [name] and his wife immediately following the testing session.    RECOMMENDATIONS:  Recommendations for treatment team:     [name] endorsed items consistent with severe anxiety  and mild depression, which could be exacerbating his cognitive difficulties and slowing his recovery process.  His physician may consider evaluating the efficacy of his current medication regimen to see if alterations would be medically indicated to improve mood.     When interacting with [name], directions and information should be provided in a simple, straight forward manner, and the treatment team should avoid giving multiple instructions simultaneously.    [name] may also benefit from being provided with multiple trials to learn new skills given the noted memory inefficiencies.    To the extent possible, multitasking should be avoided.   [name] requires more time than typical to process information. The treatment team may benefit from waiting for a verbal response to information before presenting additional information.    Performance will generally be best in a structured, routine, and familiar environment, as opposed to situations involving complex problems.   Recommendations for discharge planning:    Engage in individual psychotherapy in order to learn more effective coping strategies to deal with life stressors.     Complete a comprehensive neuropsychological evaluation as an outpatient in 12 months to assess for interval change.   Maintain engagement in mentally, physically and cognitively stimulating activities.    Strive to maintain a healthy lifestyle (e.g., proper diet and exercise) in order to promote physical, cognitive and emotional health.   Edward [name], Psy.D.  Clinical Neuropsychologist

## 2012-03-21 NOTE — Progress Notes (Signed)
Occupational Therapy Session Note  Patient Details  Name: Edward [name] MRN: [id number] Date of Birth: 04-27-1951  Today's Date: 03/21/2012 Time: 0700-0800 Time Calculation (min): 60 min  Short Term Goals: Week 1:  OT Short Term Goal 1 (Week 1): Patient will complete functional transfers on/off toilet with min assist (steadying) OT Short Term Goal 2 (Week 1): Patient will complete upper body dressing with modified independence OT Short Term Goal 3 (Week 1): Patient will complete lower body dressing with min assist OT Short Term Goal 4 (Week 1): Patient will demo ability to complete functional hand exercises with supervision. OT Short Term Goal 5 (Week 1): Patient will complete bathing, sitting and standing, with modified independence  Skilled Therapeutic Interventions/Progress Updates:    Pt amb with PFRW to bathroom to perform walk-in shower transfer and complete bathing tasks with sit<>stand from shower seat. Pt required assistance donning bath mitt to complete bathing tasks.  Pt amb with PFRW to w/c at sink to complete grooming and dressing tasks.  Pt uses universal cuff to hold tooth brush.  Pt required assist with donning universal cuff.  Pt using bilateral UE/hands to bring cup to mouth to rinse after brushing teeth.  Focus on standing balance, safety awareness, and compensatory techniques.  Therapy Documentation Precautions:  Precautions Precautions: Cervical;Fall Precaution Comments: s/p ACDF and has weakness in B UE's and  Required Braces or Orthoses: Cervical Brace Cervical Brace: Hard collar Restrictions Weight Bearing Restrictions: No Pain Assessment Pain Assessment: No/denies pain  See FIM for current functional status  Therapy/Group: Individual Therapy  Rich Brave 03/21/2012, 8:06 AM

## 2012-03-21 NOTE — Progress Notes (Signed)
Physical Therapy Session Note  Patient Details  Name: Edward [name] MRN: [id number] Date of Birth: June 26, 1951  Today's Date: 03/21/2012 Time: 4098-1191 Time Calculation (min): 58 min  Short Term Goals: Week 1:  PT Short Term Goal 1 (Week 1): Patient will be able to perform bed mobility with min-A PT Short Term Goal 2 (Week 1): Patient will be able to perform transfers with min-A PT Short Term Goal 3 (Week 1): Patient will be able to ambulate x 30' using LRAD with Mod-Assist x 2  Skilled Therapeutic Interventions/Progress Updates:    - Gait training x 120' with bil. Platform walker performed with nearly constant min assist for lateral losses of balance, up to moderate assist for turning.  - Performed forward foot taps to dots progressing to 2" block practicing single limb stance (SLS) control without bil. UE support, up to moderate assist particularly with Rt. SLS. Very impaired control of Rt. LE when not locked in extension. Pt educated on risks of repeated recurvatum to knee joint.  - Introduced Rt. Knee cage, pt practiced SLS activities and gait and reported significant improvement. Balance noted to be much improved.  - Gait x 100', 120' with bil. Platform walker + knee cage with supervision/min assist.  - 2 steps x 3 reps with bil. Rails, up to moderate assist. Most difficulty with eccentric control.   Sit <> stands throughout session supervision with walker in front, min assist without walker secondary to initial anterior lean.   Therapy Documentation Precautions:  Precautions Precautions: Cervical;Fall Precaution Comments: s/p ACDF and has weakness in B UE's and  Required Braces or Orthoses: Cervical Brace Cervical Brace: Hard collar Restrictions Weight Bearing Restrictions: No Vital Signs: Therapy Vitals Pulse Rate: 84  BP: 140/70 mmHg Pain: No c/o pain      Other Treatments:    See FIM for current functional status  Therapy/Group: Individual  Therapy  Sherrine Maples Cheek 03/21/2012, 12:00 PM

## 2012-03-21 NOTE — Progress Notes (Signed)
Subjective/Complaints: Walked well with knee brace. Helped control recurvatum.  Usually has been moving his bowels but didn't yesterday. Fixates a bit on it. A 12 point review of systems has been performed and if not noted above is otherwise negative.   Objective: Vital Signs: Blood pressure 140/72, pulse 82, temperature 98.2 F (36.8 C), temperature source Oral, resp. rate 18, weight 100 kg (220 lb 7.4 oz), SpO2 99.00%. No results found. Results for orders placed during the hospital encounter of 03/15/12 (from the past 72 hour(s))  CBC WITH DIFFERENTIAL     Status: Normal   Collection Time   03/18/12 10:00 AM      Component Value Range Comment   WBC 7.8  4.0 - 10.5 K/uL    RBC 4.44  4.22 - 5.81 MIL/uL    Hemoglobin 14.1  13.0 - 17.0 g/dL    HCT 16.1  09.6 - 04.5 %    MCV 89.6  78.0 - 100.0 fL    MCH 31.8  26.0 - 34.0 pg    MCHC 35.4  30.0 - 36.0 g/dL    RDW 40.9  81.1 - 91.4 %    Platelets 227  150 - 400 K/uL    Neutrophils Relative 60  43 - 77 %    Neutro Abs 4.7  1.7 - 7.7 K/uL    Lymphocytes Relative 25  12 - 46 %    Lymphs Abs 2.0  0.7 - 4.0 K/uL    Monocytes Relative 11  3 - 12 %    Monocytes Absolute 0.8  0.1 - 1.0 K/uL    Eosinophils Relative 5  0 - 5 %    Eosinophils Absolute 0.4  0.0 - 0.7 K/uL    Basophils Relative 0  0 - 1 %    Basophils Absolute 0.0  0.0 - 0.1 K/uL   COMPREHENSIVE METABOLIC PANEL     Status: Abnormal   Collection Time   03/18/12 10:00 AM      Component Value Range Comment   Sodium 136  135 - 145 mEq/L    Potassium 3.6  3.5 - 5.1 mEq/L    Chloride 101  96 - 112 mEq/L    CO2 26  19 - 32 mEq/L    Glucose, Bld 89  70 - 99 mg/dL    BUN 14  6 - 23 mg/dL    Creatinine, Ser 7.82  0.50 - 1.35 mg/dL    Calcium 8.9  8.4 - 95.6 mg/dL    Total Protein 6.7  6.0 - 8.3 g/dL    Albumin 3.3 (*) 3.5 - 5.2 g/dL    AST 28  0 - 37 U/L    ALT 38  0 - 53 U/L    Alkaline Phosphatase 51  39 - 117 U/L    Total Bilirubin 0.4  0.3 - 1.2 mg/dL    GFR calc non Af  Amer 88 (*) >90 mL/min    GFR calc Af Amer >90  >90 mL/min       General: Alert and oriented x 3, No apparent distress HEENT: Head is normocephalic, atraumatic, PERRLA, EOMI, sclera anicteric, oral mucosa pink and moist, dentition intact, ext ear canals clear, nose dressed, sutures in place Neck: Supple without JVD or lymphadenopathy Heart: Reg rate and rhythm. No murmurs rubs or gallops Chest: CTA bilaterally without wheezes, rales, or rhonchi; no distress Abdomen: Soft, non-tender, non-distended, bowel sounds positive. Extremities: No clubbing, cyanosis, or edema. Pulses are 2+ Skin: Clean and intact without  signs of breakdown Neuro: Pt is cognitively appropriate with normal insight, memory, and awareness. Cranial nerves 2-12 are intact. Sensory exam is 1/2 in hands and feet.  Biceps and deltoids are 3 to 3+. Triceps 2-3, WE and WF are 1-2. HI are trace to absent. Lower ext are 3-4/5.  Musculoskeletal: Full ROM, No pain with AROM or PROM in the neck, trunk, or extremities. Posture appropriate. No LE edema.  Feet warm to touch.  Bilateral hands with minimal edema.  Psych: Pt's affect is appropriate. Pt is cooperative     Assessment/Plan: 1. Functional deficits secondary to Central cord syndrome after fall s/p ACDF 4-5 which require 3+ hours per day of interdisciplinary therapy in a comprehensive inpatient rehab setting. Physiatrist is providing close team supervision and 24 hour management of active medical problems listed below. Physiatrist and rehab team continue to assess barriers to discharge/monitor patient progress toward functional and medical goals.  Needs work on balance and trunk control.  Did well with PT today. Consider ordering a knee brace depending on how consistent and continued the improvement in knee stability is.  FIM: FIM - Bathing Bathing Steps Patient Completed: Chest;Right Arm;Left Arm;Abdomen;Front perineal area;Buttocks;Right upper leg;Left upper leg Bathing:  4: Min-Patient completes 8-9 57f 10 parts or 75+ percent  FIM - Upper Body Dressing/Undressing Upper body dressing/undressing steps patient completed: Thread/unthread right sleeve of pullover shirt/dresss;Thread/unthread left sleeve of pullover shirt/dress;Put head through opening of pull over shirt/dress Upper body dressing/undressing: 4: Min-Patient completed 75 plus % of tasks FIM - Lower Body Dressing/Undressing Lower body dressing/undressing steps patient completed: Thread/unthread right pants leg;Thread/unthread left pants leg Lower body dressing/undressing: 2: Max-Patient completed 25-49% of tasks  FIM - Toileting Toileting steps completed by patient: Performs perineal hygiene;Adjust clothing prior to toileting;Adjust clothing after toileting (Patient wearing a gown so can move out of the way to sit ) Toileting Assistive Devices: Grab bar or rail for support Toileting: 0: Activity did not occur  FIM - Diplomatic Services operational officer Devices: Grab bars Toilet Transfers: 3-To toilet/BSC: Mod A (lift or lower assist);3-From toilet/BSC: Mod A (lift or lower assist)  FIM - Bed/Chair Transfer Bed/Chair Transfer Assistive Devices: Bed rails;Arm rests Bed/Chair Transfer: 5: Supine > Sit: Supervision (verbal cues/safety issues);4: Bed > Chair or W/C: Min A (steadying Pt. > 75%);4: Chair or W/C > Bed: Min A (steadying Pt. > 75%);5: Sit > Supine: Supervision (verbal cues/safety issues)  FIM - Locomotion: Wheelchair Distance: 20 Locomotion: Wheelchair: 2: Travels 50 - 149 ft with supervision, cueing or coaxing FIM - Locomotion: Ambulation Locomotion: Ambulation Assistive Devices: TEFL teacher (bilateral) Ambulation/Gait Assistance: 3: Mod assist;4: Min assist Locomotion: Ambulation: 2: Travels 50 - 149 ft with moderate assistance (Pt: 50 - 74%)  Comprehension Comprehension Mode: Auditory Comprehension: 6-Follows complex conversation/direction: With extra time/assistive  device  Expression Expression Mode: Verbal Expression: 6-Expresses complex ideas: With extra time/assistive device  Social Interaction Social Interaction: 7-Interacts appropriately with others - No medications needed.  Problem Solving Problem Solving: 6-Solves complex problems: With extra time  Memory Memory: 6-More than reasonable amt of time   Medical Problem List and Plan:  1. DVT Prophylaxis/Anticoagulation: Mechanical: Sequential compression devices, below knee Bilateral lower extremities, movibility 2. Pain Management: neurontin change helped.  3. Mood: motivated. Monitor for now.  4. Neuropsych: This patient is capable of making decisions on his/her own behalf.  5. HTN: BP has been variable. Monitor with bid checks. Resumed BB and Norvasc -- monitor for orthostatics. prinzide still on hold. 6.  CAD: monitor for symptoms. Resume low dose toprol and zocor.  7. Dysesthesias: neurontin 200 tid 8. Constipation: on Senna S and miralax bid. No BM yesterday but thinks pt nausea yesterday related to meds. Suppository today if no BM.   LOS (Days) 6 A FACE TO FACE EVALUATION WAS PERFORMED  Love, Evlyn Kanner 03/21/2012, 8:14 AM

## 2012-03-21 NOTE — Progress Notes (Addendum)
Physical Therapy Session Note  Patient Details  Name: Edward [name] MRN: [id number] Date of Birth: [birth year]  Today's Date: 03/21/2012  Short Term Goals: Week 1:  PT Short Term Goal 1 (Week 1): Patient will be able to perform bed mobility with min-A PT Short Term Goal 2 (Week 1): Patient will be able to perform transfers with min-A PT Short Term Goal 3 (Week 1): Patient will be able to ambulate x 30' using LRAD with Mod-Assist x 2  1:2  Skilled Therapeutic Interventions/Progress Updates:  Time: 8295-6213 Time Calculation (min): 30 min Tx focused on gait training with bil platform RW, specifically gait quality and balance. Pt continues to c/o R foot discomfort/numbess, so tried gait with shoes on today with good results. Pt needing Max A for donning socks and shoes. Overall, pt Min A for gait in controled environment, but up to Mod A during turns in either direction or with start/stopping transitions. Pt responds well to cues for increasing step width and decreasing R step length, but continues to have difficulty avoiding R knee hyperextension in stance phase. Pt also given goal to work towards decreasing UE weight bearing and rely more on LEs for stability.  Gait training 1x300' and 1x150' with bil PFRW and up to Mod A for steadying and recovery for both R/L LOB, esp during turns.  Seated LAQ with 3# weights bil x15 with cues for upright trunk posture to limit compensation for end range extension.  All sit<>stand transfers with Min A for steadying only. Pt demonstrating improved eccentric control.   2:2  Skilled Therapeutic Interventions/Progress Updates:  Time: 13:03-13:50 Time Calculation (min): 47 min Tx focused on gait training with knee cage and bil PFRW, sit<>stand transfers for functional strengthening, and Nustep for LE strengthening in full ROM.   Gait training: pt with improved balance using knee cage, now more Min A than Mod 2x120.' Pt still having LOB during turns.  Discussed slowing to improve foot placement during turns.    Sit<>stand 2x12 with pt picking up horseshoe from mat with R then L hand, and standing to reach and place it on basketball hoop with Min-guard only - without knee cage due to pressure. Pt needing cues to reach full extension and Min A for steadying when too far anterior outside BOS. Pt struggling to grab and place horseshoe due to UE/hand weakness, but pt successful with this task.   Nustep x90min (level 5x86min, level 7x57min) with bil UE and grips and bil LE. Pt with goal of 60spm.       Therapy Documentation Precautions:  Precautions Precautions: Cervical;Fall Precaution Comments: s/p ACDF and has weakness in B UE's and  Required Braces or Orthoses: Cervical Brace Cervical Brace: Hard collar Restrictions Weight Bearing Restrictions: No Pain: Pain Assessment Pain Assessment: No/denies pain  See FIM for current functional status  Therapy/Group: Individual Therapy  Virl Cagey, PT 03/21/2012, 9:24 AM

## 2012-03-22 ENCOUNTER — Inpatient Hospital Stay (HOSPITAL_COMMUNITY): Payer: Federal, State, Local not specified - PPO

## 2012-03-22 ENCOUNTER — Inpatient Hospital Stay (HOSPITAL_COMMUNITY): Payer: Federal, State, Local not specified - PPO | Admitting: Physical Therapy

## 2012-03-22 NOTE — Progress Notes (Signed)
Physical Therapy Weekly Progress Note  Patient Details  Name: Edward [name] MRN: [id number] Date of Birth: 05-Sep-1950  Today's Date: 03/22/2012 Time: 1400-1500 Time Calculation (min): 60 min  Patient has met 3 of 3 short term goals.    Patient continues to demonstrate the following deficits: bil. LE/UE weakness, impaired dynamic and static balance, increased need for physical assist with ambulation particularly with turns and therefore will continue to benefit from skilled PT intervention to enhance overall performance with activity tolerance, balance, ability to compensate for deficits, functional use of  right upper extremity, right lower extremity, left upper extremity and left lower extremity and awareness.  Patient progressing toward long term goals..  Continue plan of care.  PT Short Term Goals Week 1:  PT Short Term Goal 1 (Week 1): Patient will be able to perform bed mobility with min-A PT Short Term Goal 1 - Progress (Week 1): Met PT Short Term Goal 2 (Week 1): Patient will be able to perform transfers with min-A PT Short Term Goal 2 - Progress (Week 1): Met PT Short Term Goal 3 (Week 1): Patient will be able to ambulate x 30' using LRAD with Mod-Assist x 2 PT Short Term Goal 3 - Progress (Week 1): Met  Skilled Therapeutic Interventions/Progress Updates:    Ambulation training with bil. Platform rolling walker (PFRW): 2 x 130' in controlled environment with min assist; backwards walking with PFRW mod assist progressing to min assist, step by step cues for RW sequencing. Obstacle course with PFRW, min assist while negotiating cones and stepping over small stick. Bil. Foot taps for single limb stance, endurance and strengthening without UE support. Stairs x 6 reps performed sideways, appears to have improved balance however still needed up to moderate assist and moderate verbal cues for safe sequencing.   Therapy Documentation Precautions:  Precautions Precautions:  Fall Precaution Comments: s/p ACDF and has weakness in B UE's and  Required Braces or Orthoses: Cervical Brace Cervical Brace: Hard collar Restrictions Weight Bearing Restrictions: No Pain: Pain Assessment Pain Assessment: 0-10 Pain Score: 0-No pain Faces Pain Scale: No hurt Pain Type: Neuropathic pain Pain Location: Hand Pain Orientation:  (bilateral) Pain Descriptors: Tingling Pain Onset: On-going Pain Intervention(s): Repositioned (pt had just received pain medication)  See FIM for current functional status  Therapy/Group: Individual Therapy  [name] 03/22/2012, 5:06 PM

## 2012-03-22 NOTE — Progress Notes (Signed)
Occupational Therapy Session Note  Patient Details  Name: Edward [name] MRN: [id number] Date of Birth: [birth year]  Today's Date: 03/22/2012 Time: 0700-0800 Time Calculation (min): 60 min  Short Term Goals: Week 1:  OT Short Term Goal 1 (Week 1): Patient will complete functional transfers on/off toilet with min assist (steadying) OT Short Term Goal 2 (Week 1): Patient will complete upper body dressing with modified independence OT Short Term Goal 3 (Week 1): Patient will complete lower body dressing with min assist OT Short Term Goal 4 (Week 1): Patient will demo ability to complete functional hand exercises with supervision. OT Short Term Goal 5 (Week 1): Patient will complete bathing, sitting and standing, with modified independence  Skilled Therapeutic Interventions/Progress Updates:    Pt in bed resting but eager to get up and start the day.  Pt amb with PFRW to bathroom for bathing in shower with sit<>stand from shower seat using grab bars.  Pt required assistance donning bath mitt but able to complete all bathing tasks with only steady assist when standing to bathe buttocks.  Pt amb with PFRW back to w/c in room to complete grooming tasks at sink and LB dressing with sit<>stand to pull up pants.  Pt exhibiting increased grasp with right hand when holding deodorant to apply to left underarm.  Pt unable to functionally grasp deodorant with left hand.  Pt required assistance pulling down shirt over trunk secondary to C-collar.  Pt required min verbal cues to lock brakes prior to standing up.  Therapy Documentation Precautions:  Precautions Precautions: Cervical;Fall Precaution Comments: s/p ACDF and has weakness in B UE's and  Required Braces or Orthoses: Cervical Brace Cervical Brace: Hard collar Restrictions Weight Bearing Restrictions: No    Pain: Pain Assessment Pain Assessment: No/denies pain  See FIM for current functional status  Therapy/Group: Individual  Therapy  Rich Brave 03/22/2012, 8:24 AM

## 2012-03-22 NOTE — Progress Notes (Signed)
Subjective/Complaints: Bowel movement yesterday. Pain better. More upbeat A 12 point review of systems has been performed and if not noted above is otherwise negative.   Objective: Vital Signs: Blood pressure 124/87, pulse 80, temperature 97.7 F (36.5 C), temperature source Oral, resp. rate 20, weight 100 kg (220 lb 7.4 oz), SpO2 98.00%. No results found. No results found for this or any previous visit (from the past 72 hour(s)).    General: Alert and oriented x 3, No apparent distress HEENT: Head is normocephalic, atraumatic, PERRLA, EOMI, sclera anicteric, oral mucosa pink and moist, dentition intact, ext ear canals clear, nose dressed, sutures in place Neck: Supple without JVD or lymphadenopathy Heart: Reg rate and rhythm. No murmurs rubs or gallops Chest: CTA bilaterally without wheezes, rales, or rhonchi; no distress Abdomen: Soft, non-tender, non-distended, bowel sounds positive. Extremities: No clubbing, cyanosis, or edema. Pulses are 2+ Skin: Clean and intact without signs of breakdown Neuro: Pt is cognitively appropriate with normal insight, memory, and awareness. Cranial nerves 2-12 are intact. Sensory exam is 1/2 in hands and feet.  Biceps and deltoids are 3 to 3+. Triceps 2-3, WE and WF are 1-2. HI are trace to 1. Lower ext are 4- to 4/5.  Musculoskeletal: Full ROM, No pain with AROM or PROM in the neck, trunk, or extremities. Posture appropriate. No LE edema.  Feet warm to touch.  Bilateral hands with minimal edema.  Psych: Pt's affect is appropriate. Pt is cooperative     Assessment/Plan: 1. Functional deficits secondary to Central cord syndrome after fall s/p ACDF 4-5 which require 3+ hours per day of interdisciplinary therapy in a comprehensive inpatient rehab setting. Physiatrist is providing close team supervision and 24 hour management of active medical problems listed below. Physiatrist and rehab team continue to assess barriers to discharge/monitor patient  progress toward functional and medical goals.    Consider ordering a knee brace depending on how consistent and continued the improvement in knee stability is.  FIM: FIM - Bathing Bathing Steps Patient Completed: Chest;Right Arm;Left Arm;Abdomen;Front perineal area;Buttocks;Right upper leg;Left upper leg;Left lower leg (including foot);Right lower leg (including foot) Bathing: 4: Steadying assist  FIM - Upper Body Dressing/Undressing Upper body dressing/undressing steps patient completed: Thread/unthread right sleeve of pullover shirt/dresss;Thread/unthread left sleeve of pullover shirt/dress;Put head through opening of pull over shirt/dress Upper body dressing/undressing: 4: Min-Patient completed 75 plus % of tasks FIM - Lower Body Dressing/Undressing Lower body dressing/undressing steps patient completed: Thread/unthread right pants leg;Thread/unthread left pants leg;Pull pants up/down Lower body dressing/undressing: 3: Mod-Patient completed 50-74% of tasks  FIM - Toileting Toileting steps completed by patient: Performs perineal hygiene;Adjust clothing prior to toileting;Adjust clothing after toileting (Patient wearing a gown so can move out of the way to sit ) Toileting Assistive Devices: Grab bar or rail for support Toileting: 0: Activity did not occur  FIM - Diplomatic Services operational officer Devices: Grab bars Toilet Transfers: 3-To toilet/BSC: Mod A (lift or lower assist);3-From toilet/BSC: Mod A (lift or lower assist)  FIM - Bed/Chair Transfer Bed/Chair Transfer Assistive Devices: Bed rails Bed/Chair Transfer: 5: Supine > Sit: Supervision (verbal cues/safety issues);5: Sit > Supine: Supervision (verbal cues/safety issues);4: Bed > Chair or W/C: Min A (steadying Pt. > 75%);4: Chair or W/C > Bed: Min A (steadying Pt. > 75%)  FIM - Locomotion: Wheelchair Distance: 20 Locomotion: Wheelchair: 0: Activity did not occur FIM - Locomotion: Ambulation Locomotion: Ambulation  Assistive Devices: TEFL teacher Ambulation/Gait Assistance: 4: Min assist Locomotion: Ambulation: 2: Travels 50 -  149 ft with minimal assistance (Pt.>75%)  Comprehension Comprehension Mode: Auditory Comprehension: 6-Follows complex conversation/direction: With extra time/assistive device  Expression Expression Mode: Verbal Expression: 6-Expresses complex ideas: With extra time/assistive device  Social Interaction Social Interaction: 7-Interacts appropriately with others - No medications needed.  Problem Solving Problem Solving: 6-Solves complex problems: With extra time  Memory Memory: 6-More than reasonable amt of time   Medical Problem List and Plan:  1. DVT Prophylaxis/Anticoagulation: Mechanical: Sequential compression devices, below knee Bilateral lower extremities, movibility 2. Pain Management: neurontin 200 tid helping dysesthesias 3. Mood: motivated. Monitor for now.  4. Neuropsych: This patient is capable of making decisions on his/her own behalf.  5. HTN: BP has been variable. Monitor with bid checks. Resumed BB and Norvasc -bp under control. Prinzide still held 6. CAD: monitor for symptoms. Resume low dose toprol and zocor.  8. Constipation: on Senna S and miralax bid. No BM yesterday but thinks pt nausea yesterday related to meds. Suppository today if no BM.   LOS (Days) 7 A FACE TO FACE EVALUATION WAS PERFORMED  SWARTZ,ZACHARY T 03/22/2012, 8:46 AM

## 2012-03-22 NOTE — Progress Notes (Signed)
Occupational Therapy Note  Patient Details  Name: HENRYK URSIN MRN: [id number] Date of Birth: 08/14/1950 Today's Date: 03/22/2012  Time: 1100-1145 Pt denies pain Individual Therapy  Pt in bed resting but agreed to rolling in w/c to gym.  Pt rolled to gym with supervision using BUE and BLE.  Pt engaged in theraputty activities with focus on using pincher grasp to retrieve small object from theraputty and placing them in container.  Pt transitioned to grasping large dowels and placing them in peg board.  Pt exhibited increased dexterity in bilateral hands R>L.  Pt rolled w/c back to room to visit with family and friends.   [name] [facility] 03/22/2012, 12:12 PM

## 2012-03-22 NOTE — Progress Notes (Signed)
Occupational Therapy Weekly Progress Note  Patient Details  Name: Edward [name] MRN: [id number] Date of Birth: 11/05/1950  Today's Date: 03/22/2012  Patient has met 2 of 5 short term goals. Pt has exhibited steady gains in bathing and dressing tasks since admission and is currently steady assist for sit>stand with bathing at shower level, min a for UB dressing, and mod A for LB dressing; min A for toilet and shower transfers with PFRW.  Pt continues to require min verbal cues for safety awareness and occasionally exhibits some impulsivity primarily during transfers. [name] has attended several therapy sessions to observe pt's progress.  Pt is demonstrating increased dexterity in bilateral hands R>L and is incorporating compensatory techniques and bath mitt and universal cuff to assist with bathing, grooming, and self feeding.  Patient continues to demonstrate the following deficits: decreased overall activity tolerance/endurance, decreased functional use of bilateral UEs, decreased independence with functional mobility, decreased independence with BADLs. Therefore, patient will continue to benefit from skilled OT intervention to enhance overall performance with BADL, iADL and Reduce care partner burden.  Patient not progressing toward long term goals.  See goal revision..  Plan of care revisions: see Care Plan/Paths  OT Short Term Goals Week 1:  OT Short Term Goal 1 (Week 1): Patient will complete functional transfers on/off toilet with min assist (steadying) OT Short Term Goal 1 - Progress (Week 1): Met OT Short Term Goal 2 (Week 1): Patient will complete upper body dressing with modified independence OT Short Term Goal 2 - Progress (Week 1): Progressing toward goal OT Short Term Goal 3 (Week 1): Patient will complete lower body dressing with min assist OT Short Term Goal 3 - Progress (Week 1): Progressing toward goal OT Short Term Goal 4 (Week 1): Patient will demo ability to complete  functional hand exercises with supervision. OT Short Term Goal 4 - Progress (Week 1): Met OT Short Term Goal 5 (Week 1): Patient will complete bathing, sitting and standing, with modified independence OT Short Term Goal 5 - Progress (Week 1): Progressing toward goal  Precautions:  Precautions Precautions: Cervical;Fall Precaution Comments: s/p ACDF and has weakness in B UE's and  Required Braces or Orthoses: Cervical Brace Cervical Brace: Hard collar Restrictions Weight Bearing Restrictions: No  ADL: ADL Equipment Provided: Feeding equipment (bath mitt; universal cuff; cup with handle) Eating: Set up Where Assessed-Eating: Wheelchair Grooming: Supervision/safety Where Assessed-Grooming: Wheelchair;Sitting at sink Upper Body Bathing: Supervision/safety Where Assessed-Upper Body Bathing: Shower Lower Body Bathing: Contact guard Where Assessed-Lower Body Bathing: Shower;Wheelchair Upper Body Dressing: Minimal assistance Where Assessed-Upper Body Dressing: Edge of bed Lower Body Dressing: Moderate assistance Where Assessed-Lower Body Dressing: Hydrologist: Minimal Dentist Method: Proofreader: Raised toilet seat Film/video editor: Minimal assistance Film/video editor Method: Designer, industrial/product: Shower seat without back  See FIM for current functional status  Rich Brave 03/22/2012, 8:45 AM

## 2012-03-22 NOTE — Progress Notes (Signed)
Physical Therapy Session Note  Patient Details  Name: Edward [name] MRN: [id number] Date of Birth: 1950-12-19  Today's Date: 03/22/2012 Time: 0830-0900 Time Calculation (min): 30 min  Short Term Goals: Week 1:  PT Short Term Goal 1 (Week 1): Patient will be able to perform bed mobility with min-A PT Short Term Goal 2 (Week 1): Patient will be able to perform transfers with min-A PT Short Term Goal 3 (Week 1): Patient will be able to ambulate x 30' using LRAD with Mod-Assist x 2  Skilled Therapeutic Interventions/Progress Updates:    Gait training with knee cage on R and bilateral PFRW on unit with overall min A, as fatigued decreased stability noted. Single limb stance training for toe taps on step with increasing speed, standing hip abduction exercises, and functional strengthening with sit to stands without use of UEs with emphasis on eccentric control. Steady A overall for basic transfers with PFRW.  Therapy Documentation Precautions:  Precautions Precautions: Cervical;Fall Precaution Comments: s/p ACDF and has weakness in B UE's and  Required Braces or Orthoses: Cervical Brace Cervical Brace: Hard collar Restrictions Weight Bearing Restrictions: No    Pain: Pain Assessment Pain Assessment: No/denies pain Pain Score: 0-No pain Pain Type: Neuropathic pain Pain Location: Hand Pain Orientation: Right;Left  See FIM for current functional status  Therapy/Group: Individual Therapy  Edward [name] [facility] 03/22/2012, 10:02 AM

## 2012-03-23 ENCOUNTER — Inpatient Hospital Stay (HOSPITAL_COMMUNITY): Payer: Federal, State, Local not specified - PPO | Admitting: Physical Therapy

## 2012-03-23 MED ORDER — AMLODIPINE BESYLATE 5 MG PO TABS
5.0000 mg | ORAL_TABLET | Freq: Every day | ORAL | Status: DC
  Administered 2012-03-23 – 2012-04-03 (×11): 5 mg via ORAL
  Filled 2012-03-23 (×19): qty 1

## 2012-03-23 MED ORDER — LISINOPRIL 5 MG PO TABS
5.0000 mg | ORAL_TABLET | Freq: Every day | ORAL | Status: DC
  Administered 2012-03-23 – 2012-03-26 (×4): 5 mg via ORAL
  Filled 2012-03-23 (×8): qty 1

## 2012-03-23 NOTE — Plan of Care (Signed)
Problem: RH PAIN MANAGEMENT Goal: RH STG PAIN MANAGED AT OR BELOW PT'S PAIN GOAL Keep pain at less than 3 out of 10  Outcome: Not Progressing Pain score least at 6 out of 10 today with 10oxy IR

## 2012-03-23 NOTE — Progress Notes (Signed)
Physical Therapy Note  Patient Details  Name: Edward [name] MRN: [id number] Date of Birth: 03-05-1951 Today's Date: 03/23/2012  1300 -1355 (55 minutes) group Pain: no complaint of pain Pt participated in PT group session (gait group) focused on gait training/safety/endurance. Pt ambulates 160 feet X 3 with bilateral PFRW and RT knee recurvatum brace min assist for safety and to assist with guiding AD especially when stepping backward. Brace prevents RT knee hyperextension with minimal knee instability (flexion) in weight bearing.   Kiel Cockerell,JIM 03/23/2012, 8:33 AM

## 2012-03-23 NOTE — Progress Notes (Signed)
Patient ID: Edward [name], male   DOB: Apr 28, 1951, 61 y.o.   MRN: [id number]  Subjective/Complaints: No new complaints. Likes knee brace A 12 point review of systems has been performed and if not noted above is otherwise negative.   Objective: Vital Signs: Blood pressure 151/97, pulse 86, temperature 98.7 F (37.1 C), temperature source Oral, resp. rate 20, weight 100 kg (220 lb 7.4 oz), SpO2 98.00%. No results found. No results found for this or any previous visit (from the past 72 hour(s)).    General: Alert and oriented x 3, No apparent distress HEENT: Head is normocephalic, atraumatic, PERRLA, EOMI, sclera anicteric, oral mucosa pink and moist, dentition intact, ext ear canals clear, nose dressed, sutures in place Neck: Supple without JVD or lymphadenopathy Heart: Reg rate and rhythm. No murmurs rubs or gallops Chest: CTA bilaterally without wheezes, rales, or rhonchi; no distress Abdomen: Soft, non-tender, non-distended, bowel sounds positive. Extremities: No clubbing, cyanosis, or edema. Pulses are 2+ Skin: Clean and intact without signs of breakdown Neuro: Pt is cognitively appropriate with normal insight, memory, and awareness. Cranial nerves 2-12 are intact. Sensory exam is 1/2 in hands and feet.  Biceps and deltoids are 3 to 3+. Triceps 2-3, WE and WF are 1-2. HI are trace to 1. Lower ext are 4- to 4/5.  Musculoskeletal: Full ROM, No pain with AROM or PROM in the neck, trunk, or extremities. Posture appropriate. No LE edema.  Feet warm to touch.  Bilateral hands with minimal edema.  Psych: Pt's affect is appropriate. Pt is cooperative     Assessment/Plan: 1. Functional deficits secondary to Central cord syndrome after fall s/p ACDF 4-5 which require 3+ hours per day of interdisciplinary therapy in a comprehensive inpatient rehab setting. Physiatrist is providing close team supervision and 24 hour management of active medical problems listed below. Physiatrist and rehab  team continue to assess barriers to discharge/monitor patient progress toward functional and medical goals.    Will order a knee cage to assist with gait. He says this is the best he's walked in 40 years!  FIM: FIM - Bathing Bathing Steps Patient Completed: Chest;Right Arm;Left Arm;Abdomen;Front perineal area;Buttocks;Right upper leg;Left upper leg;Left lower leg (including foot);Right lower leg (including foot) Bathing: 4: Steadying assist  FIM - Upper Body Dressing/Undressing Upper body dressing/undressing steps patient completed: Thread/unthread right sleeve of pullover shirt/dresss;Thread/unthread left sleeve of pullover shirt/dress;Put head through opening of pull over shirt/dress Upper body dressing/undressing: 4: Min-Patient completed 75 plus % of tasks FIM - Lower Body Dressing/Undressing Lower body dressing/undressing steps patient completed: Thread/unthread right pants leg;Thread/unthread left pants leg;Pull pants up/down Lower body dressing/undressing: 3: Mod-Patient completed 50-74% of tasks  FIM - Toileting Toileting steps completed by patient: Performs perineal hygiene;Adjust clothing prior to toileting;Adjust clothing after toileting (Patient wearing a gown so can move out of the way to sit ) Toileting Assistive Devices: Grab bar or rail for support Toileting: 0: Activity did not occur  FIM - Diplomatic Services operational officer Devices: Grab bars Toilet Transfers: 3-To toilet/BSC: Mod A (lift or lower assist);3-From toilet/BSC: Mod A (lift or lower assist)  FIM - Bed/Chair Transfer Bed/Chair Transfer Assistive Devices: Bed rails Bed/Chair Transfer: 5: Supine > Sit: Supervision (verbal cues/safety issues);5: Sit > Supine: Supervision (verbal cues/safety issues);4: Bed > Chair or W/C: Min A (steadying Pt. > 75%);4: Chair or W/C > Bed: Min A (steadying Pt. > 75%)  FIM - Locomotion: Wheelchair Distance: 20 Locomotion: Wheelchair: 0: Activity did not occur FIM -  Locomotion:  Ambulation Locomotion: Ambulation Assistive Devices: TEFL teacher (bil. and knee cage) Ambulation/Gait Assistance: 4: Min assist Locomotion: Ambulation: 4: Travels 150 ft or more with minimal assistance (Pt.>75%)  Comprehension Comprehension Mode: Auditory Comprehension: 6-Follows complex conversation/direction: With extra time/assistive device  Expression Expression Mode: Verbal Expression: 6-Expresses complex ideas: With extra time/assistive device  Social Interaction Social Interaction: 7-Interacts appropriately with others - No medications needed.  Problem Solving Problem Solving: 6-Solves complex problems: With extra time  Memory Memory: 6-More than reasonable amt of time   Medical Problem List and Plan:  1. DVT Prophylaxis/Anticoagulation: Mechanical: Sequential compression devices, below knee Bilateral lower extremities, movibility 2. Pain Management: neurontin 200 tid helping dysesthesias 3. Mood: motivated. Monitor for now.  4. Neuropsych: This patient is capable of making decisions on his/her own behalf.  5. HTN: BP has been variable. Monitor with bid checks. Resumed BB and Norvasc -bp under control. Start prinivil  6. CAD: monitor for symptoms. Resume low dose toprol and zocor.  8. Constipation: on Senna S and miralax bid.   LOS (Days) 8 A FACE TO FACE EVALUATION WAS PERFORMED  Tylerjames Hoglund T 03/23/2012, 8:49 AM

## 2012-03-24 ENCOUNTER — Inpatient Hospital Stay (HOSPITAL_COMMUNITY): Payer: Federal, State, Local not specified - PPO | Admitting: Physical Therapy

## 2012-03-24 ENCOUNTER — Inpatient Hospital Stay (HOSPITAL_COMMUNITY): Payer: Federal, State, Local not specified - PPO

## 2012-03-24 NOTE — Progress Notes (Signed)
Patient ID: Edward [name], male   DOB: [birth year], 61 y.o.   MRN: [id number]  Subjective/Complaints: Wants to wash neck. A 12 point review of systems has been performed and if not noted above is otherwise negative.   Objective: Vital Signs: Blood pressure 148/89, pulse 71, temperature 98.5 F (36.9 C), temperature source Oral, resp. rate 20, weight 100 kg (220 lb 7.4 oz), SpO2 95.00%. No results found. No results found for this or any previous visit (from the past 72 hour(s)).    General: Alert and oriented x 3, No apparent distress HEENT: Head is normocephalic, atraumatic, PERRLA, EOMI, sclera anicteric, oral mucosa pink and moist, dentition intact, ext ear canals clear, nose dressed, sutures in place Neck: Supple without JVD or lymphadenopathy Heart: Reg rate and rhythm. No murmurs rubs or gallops Chest: CTA bilaterally without wheezes, rales, or rhonchi; no distress Abdomen: Soft, non-tender, non-distended, bowel sounds positive. Extremities: No clubbing, cyanosis, or edema. Pulses are 2+ Skin: Clean and intact without signs of breakdown Neuro: Pt is cognitively appropriate with normal insight, memory, and awareness. Cranial nerves 2-12 are intact. Sensory exam is 1/2 in hands and feet.  Biceps and deltoids are 3 to 3+. Triceps 2-3, WE and WF are 1-2. HI are trace to 1+, right hand is stronger.  Lower ext are 4- to 4/5.  Musculoskeletal: Full ROM, No pain with AROM or PROM in the neck, trunk, or extremities. Posture appropriate. No LE edema.  Feet warm to touch.  Bilateral hands with minimal edema.  Psych: Pt's affect is appropriate. Pt is cooperative     Assessment/Plan: 1. Functional deficits secondary to Central cord syndrome after fall s/p ACDF 4-5 which require 3+ hours per day of interdisciplinary therapy in a comprehensive inpatient rehab setting. Physiatrist is providing close team supervision and 24 hour management of active medical problems listed below. Physiatrist  and rehab team continue to assess barriers to discharge/monitor patient progress toward functional and medical goals.    Will order a knee cage to assist with gait. He says this is the best he's walked in 40 years!  FIM: FIM - Bathing Bathing Steps Patient Completed: Chest;Right Arm;Left Arm;Abdomen;Left lower leg (including foot);Front perineal area;Buttocks;Right upper leg;Left upper leg;Right lower leg (including foot) Bathing: 4: Steadying assist  FIM - Upper Body Dressing/Undressing Upper body dressing/undressing steps patient completed: Thread/unthread right sleeve of pullover shirt/dresss;Thread/unthread left sleeve of pullover shirt/dress;Put head through opening of pull over shirt/dress;Pull shirt over trunk Upper body dressing/undressing: 6: More than reasonable amount of time FIM - Lower Body Dressing/Undressing Lower body dressing/undressing steps patient completed: Pull pants up/down Lower body dressing/undressing: 2: Max-Patient completed 25-49% of tasks  FIM - Toileting Toileting steps completed by patient: Adjust clothing prior to toileting;Performs perineal hygiene Toileting Assistive Devices: Grab bar or rail for support Toileting: 3: Mod-Patient completed 2 of 3 steps  FIM - Diplomatic Services operational officer Devices: Grab bars Toilet Transfers: 4-To toilet/BSC: Min A (steadying Pt. > 75%);4-From toilet/BSC: Min A (steadying Pt. > 75%)  FIM - Bed/Chair Transfer Bed/Chair Transfer Assistive Devices: Bed rails Bed/Chair Transfer: 5: Bed > Chair or W/C: Supervision (verbal cues/safety issues);5: Chair or W/C > Bed: Supervision (verbal cues/safety issues)  FIM - Locomotion: Wheelchair Distance: 20 Locomotion: Wheelchair: 0: Activity did not occur FIM - Locomotion: Ambulation Locomotion: Ambulation Assistive Devices: Environmental consultant - Platform (bil. and knee cage) Ambulation/Gait Assistance: 4: Min assist Locomotion: Ambulation: 4: Travels 150 ft or more with minimal  assistance (Pt.>75%)  Comprehension Comprehension Mode: Auditory  Comprehension: 6-Follows complex conversation/direction: With extra time/assistive device  Expression Expression Mode: Verbal Expression: 6-Expresses complex ideas: With extra time/assistive device  Social Interaction Social Interaction: 7-Interacts appropriately with others - No medications needed.  Problem Solving Problem Solving: 5-Solves basic problems: With no assist  Memory Memory: 6-More than reasonable amt of time   Medical Problem List and Plan:  1. DVT Prophylaxis/Anticoagulation: Mechanical: Sequential compression devices, below knee Bilateral lower extremities, movibility 2. Pain Management: neurontin 200 tid helping dysesthesias 3. Mood: motivated. Monitor for now.  4. Neuropsych: This patient is capable of making decisions on his/her own behalf.  5. HTN: BP has been variable. Monitor with bid checks. Resumed BB and Norvasc -bp under control. Start prinivil  6. CAD: monitor for symptoms. Resume low dose toprol and zocor.  8. Constipation: on Senna S and miralax bid.   LOS (Days) 9 A FACE TO FACE EVALUATION WAS PERFORMED  SWARTZ,ZACHARY T 03/24/2012, 8:28 AM

## 2012-03-24 NOTE — Progress Notes (Signed)
Physical Therapy Note  Patient Details  Name: Edward [name] MRN: [id number] Date of Birth: 07-Jul-1951 Today's Date: 03/24/2012  1345-1445 (60 minutes) group Pain: no complaint of pain Pt participated in PT group session focused on gait safety, training/endurance; gait 80 feet X 2 bilateral PFRW min assist with recurvatum brace RT knee. ; sit to stand from mat with 4 inch step under LT LE to increase weight shift to right  3 X 10; standing (min assist) with beach ball toss with tactile cues to facilitate terminal knee extension in brace on right.   Edward [name],JIM 03/24/2012, 8:17 AM

## 2012-03-24 NOTE — Significant Event (Addendum)
Patient in shower this morning with nurse tech. Patient reaching for something on floor and slid to the floor per tech and patient. Patient denied any pain. Patient reported hit right shoulder but denied any pain. No bruise noted. Patient vitals stable and neuro WNL. Reinforced need to ask for assistance before bending to retrieve anything without assistive device. Patient verbalized understanding. Dr Riley Kill notified and new orders received. Splint replaced to patients nose since other one was wet. Patients family notified of event. Patient wife verbalized understanding. Patient sent down this afternoon for Cervical spine CT. Patients aspen collar now secure. No new complaints noted. Continue with plan of care.

## 2012-03-25 ENCOUNTER — Inpatient Hospital Stay (HOSPITAL_COMMUNITY): Payer: Federal, State, Local not specified - PPO | Admitting: *Deleted

## 2012-03-25 ENCOUNTER — Inpatient Hospital Stay (HOSPITAL_COMMUNITY): Payer: Federal, State, Local not specified - PPO

## 2012-03-25 ENCOUNTER — Inpatient Hospital Stay (HOSPITAL_COMMUNITY): Payer: Federal, State, Local not specified - PPO | Admitting: Occupational Therapy

## 2012-03-25 DIAGNOSIS — Z5189 Encounter for other specified aftercare: Secondary | ICD-10-CM

## 2012-03-25 DIAGNOSIS — G8250 Quadriplegia, unspecified: Secondary | ICD-10-CM

## 2012-03-25 NOTE — Progress Notes (Signed)
Occupational Therapy Session Note  Patient Details  Name: Edward [name] MRN: [id number] Date of Birth: May 18, 1951  Today's Date: 03/25/2012 Time: 1030-1125 Time Calculation (min): 55 min  Short Term Goals: Week 1:  OT Short Term Goal 1 (Week 1): Patient will complete functional transfers on/off toilet with min assist (steadying) OT Short Term Goal 1 - Progress (Week 1): Met OT Short Term Goal 2 (Week 1): Patient will complete upper body dressing with modified independence OT Short Term Goal 2 - Progress (Week 1): Progressing toward goal OT Short Term Goal 3 (Week 1): Patient will complete lower body dressing with min assist OT Short Term Goal 3 - Progress (Week 1): Progressing toward goal OT Short Term Goal 4 (Week 1): Patient will demo ability to complete functional hand exercises with supervision. OT Short Term Goal 4 - Progress (Week 1): Met OT Short Term Goal 5 (Week 1): Patient will complete bathing, sitting and standing, with modified independence OT Short Term Goal 5 - Progress (Week 1): Progressing toward goal  Skilled Therapeutic Interventions/Progress Updates:  Upon entering room patient found supine in bed asleep. Therapist woke patient and patient engaged in bed mobility for functional mobility in room using rolling walker. Patient ambulated -> shower seat in shower stall for ADL retraining at shower level. During shower focused on functional use of bilateral hands, managing hand-held shower, body wash, and washcloth. Patient able to complete UB/LB bathing at an overall occasionally steady assist. Patient ambulated out of shower -> bed for therapist to assist with cervical collar care; patient supine when brace off. Patient then sat edge of bed to complete UB/LB dressing. Patient also worked on donning bilateral socks, patient able to get get socks over toes but needs assistance with pulling socks up around ankle. Patient then sat at sink to perform grooming tasks with set-up  assistance. At end of session left patient seated in w/c with call bell within reach.   Precautions:  Precautions Precautions: Fall Precaution Comments: s/p ACDF and has weakness in B UE's and  Required Braces or Orthoses: Cervical Brace Cervical Brace: Hard collar Restrictions Weight Bearing Restrictions: No  See FIM for current functional status  Therapy/Group: Individual Therapy  [name] 03/25/2012, 12:19 PM

## 2012-03-25 NOTE — Progress Notes (Signed)
Patient ID: Edward [name], male   DOB: 26-Aug-1950, 61 y.o.   MRN: [id number]  Subjective/Complaints: Slipped while sitting on shower seat yesterday and fell on his right shoulder. xrays negative A 12 point review of systems has been performed and if not noted above is otherwise negative.   Objective: Vital Signs: Blood pressure 115/82, pulse 89, temperature 98.2 F (36.8 C), temperature source Oral, resp. rate 18, weight 100 kg (220 lb 7.4 oz), SpO2 97.00%. Dg Cervical Spine 2-3 Views  03/24/2012  *RADIOLOGY REPORT*  Clinical Data: Neck pain after fall.  Recent surgery  CERVICAL SPINE - 2-3 VIEW  Comparison: Preoperative MRI 03/10/2012.  Intraoperative plain films 03/13/2012.  Findings: There has been recent C4-5 ACDF with anterior plating. Hardware is intact.  Spondylosis at C5-6 and C6-7 is incompletely visualized despite swimmer's view.  No visible cervical spine fracture.  Prevertebral soft tissue swelling likely postoperative but nonspecific.  IMPRESSION: Limited exam due to poor visualization of the lower cervical and cervicothoracic region.  No visible cervical spine fracture or traumatic subluxation. Unremarkable appearing C4-C5 ACDF.   Original Report Authenticated By: Elsie Stain, M.D.    No results found for this or any previous visit (from the past 72 hour(s)).    General: Alert and oriented x 3, No apparent distress HEENT: Head is normocephalic, atraumatic, PERRLA, EOMI, sclera anicteric, oral mucosa pink and moist, dentition intact, ext ear canals clear, nose dressed, sutures in place Neck: Supple without JVD or lymphadenopathy Heart: Reg rate and rhythm. No murmurs rubs or gallops Chest: CTA bilaterally without wheezes, rales, or rhonchi; no distress Abdomen: Soft, non-tender, non-distended, bowel sounds positive. Extremities: No clubbing, cyanosis, or edema. Pulses are 2+ Skin: Clean and intact without signs of breakdown Neuro: Pt is cognitively appropriate with normal  insight, memory, and awareness. Cranial nerves 2-12 are intact. Sensory exam is 1/2 in hands and feet.  Biceps and deltoids are 3 to 3+. Triceps 2-3, WE and WF are 1-2. HI are trace to 1+, right hand is stronger.  Lower ext are 4- to 4/5.  Musculoskeletal: Full ROM, No pain with AROM or PROM in the neck, trunk, or extremities. Posture appropriate. No LE edema.  Feet warm to touch.  Bilateral hands with minimal edema.  Psych: Pt's affect is appropriate. Pt is cooperative     Assessment/Plan: 1. Functional deficits secondary to Central cord syndrome after fall s/p ACDF 4-5 which require 3+ hours per day of interdisciplinary therapy in a comprehensive inpatient rehab setting. Physiatrist is providing close team supervision and 24 hour management of active medical problems listed below. Physiatrist and rehab team continue to assess barriers to discharge/monitor patient progress toward functional and medical goals.   he may only remove collar when supine in bed. We cannot risk another incident like yesterday.  FIM: FIM - Bathing Bathing Steps Patient Completed: Chest;Left lower leg (including foot);Right Arm;Left Arm;Front perineal area;Abdomen;Buttocks;Right upper leg;Left upper leg;Right lower leg (including foot) Bathing: 4: Steadying assist  FIM - Upper Body Dressing/Undressing Upper body dressing/undressing steps patient completed: Thread/unthread right sleeve of pullover shirt/dresss;Thread/unthread left sleeve of pullover shirt/dress;Put head through opening of pull over shirt/dress;Pull shirt over trunk Upper body dressing/undressing: 6: More than reasonable amount of time FIM - Lower Body Dressing/Undressing Lower body dressing/undressing steps patient completed: Pull pants up/down Lower body dressing/undressing: 2: Max-Patient completed 25-49% of tasks  FIM - Toileting Toileting steps completed by patient: Adjust clothing prior to toileting;Performs perineal hygiene Toileting  Assistive Devices: Grab bar or rail  for support Toileting: 3: Mod-Patient completed 2 of 3 steps  FIM - Diplomatic Services operational officer Devices: Grab bars Toilet Transfers: 4-To toilet/BSC: Min A (steadying Pt. > 75%);4-From toilet/BSC: Min A (steadying Pt. > 75%)  FIM - Bed/Chair Transfer Bed/Chair Transfer Assistive Devices: Bed rails Bed/Chair Transfer: 4: Bed > Chair or W/C: Min A (steadying Pt. > 75%);4: Chair or W/C > Bed: Min A (steadying Pt. > 75%)  FIM - Locomotion: Wheelchair Distance: 20 Locomotion: Wheelchair: 0: Activity did not occur FIM - Locomotion: Ambulation Locomotion: Ambulation Assistive Devices: TEFL teacher (bil. and knee cage) Ambulation/Gait Assistance: 4: Min assist Locomotion: Ambulation: 4: Travels 150 ft or more with minimal assistance (Pt.>75%)  Comprehension Comprehension Mode: Auditory Comprehension: 6-Follows complex conversation/direction: With extra time/assistive device  Expression Expression Mode: Verbal Expression: 6-Expresses complex ideas: With extra time/assistive device  Social Interaction Social Interaction: 7-Interacts appropriately with others - No medications needed.  Problem Solving Problem Solving: 5-Solves basic problems: With no assist  Memory Memory: 6-More than reasonable amt of time   Medical Problem List and Plan:  1. DVT Prophylaxis/Anticoagulation: Mechanical: Sequential compression devices, below knee Bilateral lower extremities, movibility 2. Pain Management: neurontin 200 tid helping dysesthesias 3. Mood: motivated. Monitor for now.  4. Neuropsych: This patient is capable of making decisions on his/her own behalf.  5. HTN: BP has been variable. Monitor with bid checks. Resumed BB and Norvasc -bp under control. Start prinivil  6. CAD: monitor for symptoms. Resume low dose toprol and zocor.  8. Constipation: on Senna S and miralax bid.   LOS (Days) 10 A FACE TO FACE EVALUATION WAS  PERFORMED  Edward [name] T 03/25/2012, 7:54 AM

## 2012-03-25 NOTE — Progress Notes (Signed)
Physical Therapy Session Note  Patient Details  Name: Edward [name] MRN: [id number] Date of Birth: 09/04/1950  Today's Date: 03/25/2012 Time: 0803-0900 Time Calculation (min): 57 min       Skilled Therapeutic Interventions/Progress Updates:    NMR- standing without UE support to challenge trunk and balance, performed squats UE ROM, glut sets, sidestepping, repetitive sit to stands with cues to decrease extra wide BOS Performed hand and thumb ROM on seated rest breaks  Car transfer stand pivot min A  Gait training without UE support 80, 130 x 2 min A for balance Without knee cage R knee locks for stability, with knee cage knee buckled x 3 (first 3 steps) then pt gained control on first walk with it and no bucking and improved balance on last walk, decrease push off B and decreased trunk rotation Stair training  Therapy Documentation Precautions:  Precautions Precautions: Fall Precaution Comments: s/p ACDF and has weakness in B UE's and  Required Braces or Orthoses: Cervical Brace Cervical Brace: Hard collar Restrictions Weight Bearing Restrictions: No  Pain: Pain Assessment Pain Assessment: No/denies pain  Mobility: min A stand pivot   Locomotion : Ambulation Ambulation/Gait Assistance: 4: Min assist Gait Gait: Yes Gait Pattern: Impaired Stairs / Additional Locomotion Stairs: Yes Stairs Assistance: 4: Min guard (R knee cage) Stair Management Technique: Two rails;Step to pattern (cue for technique) Number of Stairs: 5   Other Treatments: Treatments Neuromuscular Facilitation: Right;Left;Upper Extremity;Lower Extremity;Activity to increase grading;Activity to increase motor control  See FIM for current functional status  Therapy/Group: Individual Therapy  Michaelene Song 03/25/2012, 8:57 AM

## 2012-03-25 NOTE — Progress Notes (Signed)
Physical Therapy Session Note  Patient Details  Name: Edward [name] MRN: [id number] Date of Birth: [birth year]  Today's Date: 03/25/2012 Time: 4098-1191 Time Calculation (min): 28 min  Skilled Therapeutic Interventions/Progress Updates:    Gait training without device forward, sidestep, backwards, 100 x 4; increased unsteadiness with some near scissoring at end of session when fatigued, min A; no increased LOB with conversation or on carpet  Therapy Documentation Precautions:  Precautions Precautions: Fall Precaution Comments: s/p ACDF and has weakness in B UE's and  Required Braces or Orthoses: Cervical Brace Cervical Brace: Hard collar Restrictions Weight Bearing Restrictions: No Pain: Pain Assessment Pain Score: 0-No pain Faces Pain Scale: No hurt did request meds at end of session, RN informed  See FIM for current functional status  Therapy/Group: Individual Therapy  Michaelene Song 03/25/2012, 3:35 PM

## 2012-03-25 NOTE — Progress Notes (Signed)
Occupational Therapy Note  Patient Details  Name: Edward [name] MRN: [id number] Date of Birth: [birth year] Today's Date: 03/25/2012  Time: 1300-1345 Pt denies pain Individual Therapy  Pt amb with PFRM from room to gym to engage in BUE hand activities with theraputty and small objects with emphasis on pincher grasp and release.  Pt picked up small objects from table to place in theraputty and then removed objects from theraputty to place in container.  At completion of task pt rolled theraputty into a ball to place in container.  Pt exhibited increased control and strength in bilateral hands/fingers R>L.   [name] [facility] 03/25/2012, 3:37 PM

## 2012-03-26 ENCOUNTER — Inpatient Hospital Stay (HOSPITAL_COMMUNITY): Payer: Federal, State, Local not specified - PPO | Admitting: Occupational Therapy

## 2012-03-26 ENCOUNTER — Inpatient Hospital Stay (HOSPITAL_COMMUNITY): Payer: Federal, State, Local not specified - PPO | Admitting: Physical Therapy

## 2012-03-26 ENCOUNTER — Inpatient Hospital Stay (HOSPITAL_COMMUNITY): Payer: Federal, State, Local not specified - PPO | Admitting: *Deleted

## 2012-03-26 ENCOUNTER — Inpatient Hospital Stay (HOSPITAL_COMMUNITY): Payer: Federal, State, Local not specified - PPO

## 2012-03-26 NOTE — Patient Care Conference (Signed)
Inpatient RehabilitationTeam Conference Note Date: 03/26/2012   Time: 2:10 PM    Patient Name: Edward [name]      Medical Record Number: [id number]  Date of Birth: 25-Jun-1951 Sex: Male         Room/Bed: 4007/4007-01 Payor Info: Payor: BLUE CROSS BLUE SHIELD  Plan: [employer] PPO  Product Type: *No Product type*     Admitting Diagnosis: CENTRAL CORD SYNDROME  Admit Date/Time:  03/15/2012  6:46 PM Admission Comments: No comment available   Primary Diagnosis:  SCI (spinal cord injury) Principal Problem: SCI (spinal cord injury)  Patient Active Problem List   Diagnosis Date Noted  . Cervical myelopathy 03/18/2012  . SCI (spinal cord injury) 03/18/2012  . HTN (hypertension) 03/18/2012  . Nasal bone fracture 03/11/2012  . CAD (coronary artery disease) 03/11/2012    Expected Discharge Date: Expected Discharge Date: 04/03/12  Team Members Present: Physician: Dr. Faith Rogue Social Worker Present: Dossie Der, LCSW Nurse Present: Daryll Brod, RN PT Present: Karolee Stamps, Mammie Lorenzo, PT OT Present: Edwin Cap, OT;Other (comment) Rose Fillers) SLP Present: Feliberto Gottron, SLP Other (Discipline and Name): Charolette Child Coordinator     Current Status/Progress Goal Weekly Team Focus  Medical   improving pain and motor skills  safety education, pain mgt, bowel and bladder mgt  see prior   Bowel/Bladder   Continent  of bowel and bladder         Swallow/Nutrition/ Hydration             ADL's   overall supervision -> steady assist  supervision/mod I overall  functional use of bilateral UEs, ADL retraining, overall activity tolerance/endurance, functional mobility   Mobility   Min Assist for gait, stairs  Supervision/min assist  Gait training, Rt. knee control, strengthening and endurance. Balance (dynamic/static).   Communication             Safety/Cognition/ Behavioral Observations            Pain   Oxy 10mg  q 3   Pain < or = 3  Assess pain q  q3 hrs and treat PRN   Skin   Incision to anterior neck with dermabond  No new skin breakdown  Monitor for new skin breakdown      *See Interdisciplinary Assessment and Plan and progress notes for long and short-term goals  Barriers to Discharge: ongoing neuro deficits, chronic left knee pain    Possible Resolutions to Barriers:  see prior    Discharge Planning/Teaching Needs:  Home with wife who has been here to observe in therapies.  Very supportvie family who can provide care at discharge.      Team Discussion:  Pt making good progress. Fell on Sunday-checked ok.  Pain much better, reaching goals more quickly than thought.  Need more family education.  Brace ordered  Revisions to Treatment Plan:  Reaching goals sooner than expected moved up discharge date to 9/4   Continued Need for Acute Rehabilitation Level of Care: The patient requires daily medical management by a physician with specialized training in physical medicine and rehabilitation for the following conditions: Daily direction of a multidisciplinary physical rehabilitation program to ensure safe treatment while eliciting the highest outcome that is of practical value to the patient.: Yes Daily medical management of patient stability for increased activity during participation in an intensive rehabilitation regime.: Yes Daily analysis of laboratory values and/or radiology reports with any subsequent need for medication adjustment of medical intervention for : Post surgical problems;Neurological problems;Other  Lucy Chris 03/26/2012, 3:33 PM

## 2012-03-26 NOTE — Progress Notes (Signed)
Recreational Therapy Session Note  Patient Details  Name: ZYIRE EIDSON MRN: [id number] Date of Birth: 1951-07-10 Today's Date: 03/26/2012 Time:  1450-1515 Pain: no c/o Skilled Therapeutic Interventions/Progress Updates: Pt participated in basketball activities focusing on dynamic standing balance, bilateral knee control while tossing or bounce passing using BUE's while wearing R knee brace with min-mod assist.  Pt required verbal cues for safety with descent from standing to sitting.  Therapy/Group: Co-Treatment  Lynanne Delgreco 03/26/2012, 4:27 PM

## 2012-03-26 NOTE — Progress Notes (Signed)
Patient ID: Edward [name], male   DOB: 11-Feb-1951, 61 y.o.   MRN: [id number]  Subjective/Complaints: No problems yesterday.  A 12 point review of systems has been performed and if not noted above is otherwise negative.   Objective: Vital Signs: Blood pressure 134/80, pulse 64, temperature 98.2 F (36.8 C), temperature source Oral, resp. rate 19, weight 100 kg (220 lb 7.4 oz), SpO2 100.00%. Dg Cervical Spine 2-3 Views  03/24/2012  *RADIOLOGY REPORT*  Clinical Data: Neck pain after fall.  Recent surgery  CERVICAL SPINE - 2-3 VIEW  Comparison: Preoperative MRI 03/10/2012.  Intraoperative plain films 03/13/2012.  Findings: There has been recent C4-5 ACDF with anterior plating. Hardware is intact.  Spondylosis at C5-6 and C6-7 is incompletely visualized despite swimmer's view.  No visible cervical spine fracture.  Prevertebral soft tissue swelling likely postoperative but nonspecific.  IMPRESSION: Limited exam due to poor visualization of the lower cervical and cervicothoracic region.  No visible cervical spine fracture or traumatic subluxation. Unremarkable appearing C4-C5 ACDF.   Original Report Authenticated By: Elsie Stain, M.D.    No results found for this or any previous visit (from the past 72 hour(s)).    General: Alert and oriented x 3, No apparent distress HEENT: Head is normocephalic, atraumatic, PERRLA, EOMI, sclera anicteric, oral mucosa pink and moist, dentition intact, ext ear canals clear, nose dressed, sutures in place Neck: Supple without JVD or lymphadenopathy Heart: Reg rate and rhythm. No murmurs rubs or gallops Chest: CTA bilaterally without wheezes, rales, or rhonchi; no distress Abdomen: Soft, non-tender, non-distended, bowel sounds positive. Extremities: No clubbing, cyanosis, or edema. Pulses are 2+ Skin: Clean and intact without signs of breakdown Neuro: Pt is cognitively appropriate with normal insight, memory, and awareness. Cranial nerves 2-12 are intact.  Sensory exam is 1/2 in hands and feet.  Biceps and deltoids are 3 to 3+. Triceps 2-3, WE and WF are 1-2. HI are trace to 1+, right hand is stronger.  Lower ext are 4- to 4/5.  Musculoskeletal: Full ROM, No pain with AROM or PROM in the neck, trunk, or extremities. Posture appropriate. No LE edema.  Feet warm to touch.  Bilateral hands with minimal edema.  Psych: Pt's affect is appropriate. Pt is cooperative     Assessment/Plan: 1. Functional deficits secondary to Central cord syndrome after fall s/p ACDF 4-5 which require 3+ hours per day of interdisciplinary therapy in a comprehensive inpatient rehab setting. Physiatrist is providing close team supervision and 24 hour management of active medical problems listed below. Physiatrist and rehab team continue to assess barriers to discharge/monitor patient progress toward functional and medical goals.   he may only remove collar when supine in bed. We cannot risk another incident like yesterday.  FIM: FIM - Bathing Bathing Steps Patient Completed: Chest;Right Arm;Left Arm;Abdomen;Front perineal area;Right upper leg;Left upper leg;Buttocks;Right lower leg (including foot);Left lower leg (including foot) Bathing: 4: Steadying assist  FIM - Upper Body Dressing/Undressing Upper body dressing/undressing steps patient completed: Thread/unthread right sleeve of pullover shirt/dresss;Thread/unthread left sleeve of pullover shirt/dress;Put head through opening of pull over shirt/dress;Pull shirt over trunk Upper body dressing/undressing: 4: Steadying assist (to assist with managing shirt over cervical collar) FIM - Lower Body Dressing/Undressing Lower body dressing/undressing steps patient completed: Thread/unthread right pants leg;Thread/unthread left pants leg;Pull pants up/down Lower body dressing/undressing: 3: Mod-Patient completed 50-74% of tasks  FIM - Toileting Toileting steps completed by patient: Adjust clothing prior to toileting;Performs  perineal hygiene Toileting Assistive Devices: Grab bar or rail for  support Toileting: 0: Activity did not occur  FIM - Diplomatic Services operational officer Devices: Grab bars Toilet Transfers: 0-Activity did not occur  FIM - Banker Devices: Bed rails Bed/Chair Transfer: 6: Supine > Sit: No assist;4: Bed > Chair or W/C: Min A (steadying Pt. > 75%);4: Chair or W/C > Bed: Min A (steadying Pt. > 75%)  FIM - Locomotion: Wheelchair Distance: 20 Locomotion: Wheelchair: 5: Travels 150 ft or more: maneuvers on rugs and over door sills with supervision, cueing or coaxing FIM - Locomotion: Ambulation Locomotion: Ambulation Assistive Devices: Environmental consultant - Platform (bil. and knee cage) Ambulation/Gait Assistance: 4: Min assist Locomotion: Ambulation: 2: Travels 50 - 149 ft with minimal assistance (Pt.>75%)  Comprehension Comprehension Mode: Auditory Comprehension: 7-Follows complex conversation/direction: With no assist  Expression Expression Mode: Verbal Expression: 7-Expresses complex ideas: With no assist  Social Interaction Social Interaction: 7-Interacts appropriately with others - No medications needed.  Problem Solving Problem Solving: 7-Solves complex problems: Recognizes & self-corrects  Memory Memory: 7-Complete Independence: No helper   Medical Problem List and Plan:  1. DVT Prophylaxis/Anticoagulation: Mechanical: Sequential compression devices, below knee Bilateral lower extremities, movibility 2. Pain Management: neurontin 200 tid helping dysesthesias 3. Mood: motivated. Monitor for now.  4. Neuropsych: This patient is capable of making decisions on his/her own behalf.  5. HTN: BP has been variable. Monitor with bid checks. Resumed BB and Norvasc -bp under control. Started prinivil  6. CAD: monitor for symptoms. Resume low dose toprol and zocor.  8. Constipation: on Senna S and miralax bid.   LOS (Days) 11 A FACE TO FACE  EVALUATION WAS PERFORMED  SWARTZ,ZACHARY T 03/26/2012, 8:04 AM

## 2012-03-26 NOTE — Progress Notes (Signed)
Occupational Therapy Session Note  Patient Details  Name: Edward [name] MRN: [id number] Date of Birth: 02/25/1951  Today's Date: 03/26/2012 Time: 1030-1125 Time Calculation (min): 55 min  Skilled Therapeutic Interventions/Progress Updates:  No complaints of pain during session Patient found supine in bed. Engaged in functional mobility around room using rolling walker. Transferred into shower stall for ADL retraining at shower level. Patient then performed UB/LB bathing at supervision level without use of any AE. Patient then ambulated -> bed for therapist to assist with cervical care and UB/LB dressing at supervision level, just needing assistance with donning shirt to adjust shirt around cervical brace. Patient then transferred into w/c. Left patient seated at sink to perform grooming tasks independently.   Precautions:  Precautions Precautions: Fall Precaution Comments: s/p ACDF and has weakness in B UE's and  Required Braces or Orthoses: Cervical Brace Cervical Brace: Hard collar Restrictions Weight Bearing Restrictions: No  See FIM for current functional status  Therapy/Group: Individual Therapy  Elyjah Hazan 03/26/2012, 12:01 PM

## 2012-03-26 NOTE — Progress Notes (Signed)
Physical Therapy Session Note  Patient Details  Name: Edward [name] MRN: [id number] Date of Birth: [birth year]  Today's Date: 03/26/2012 Time: 0830-0929 Time Calculation (min): 59 min  Short Term Goals: Week 1:  PT Short Term Goal 1 (Week 1): Patient will be able to perform bed mobility with min-A PT Short Term Goal 1 - Progress (Week 1): Met PT Short Term Goal 2 (Week 1): Patient will be able to perform transfers with min-A PT Short Term Goal 2 - Progress (Week 1): Met PT Short Term Goal 3 (Week 1): Patient will be able to ambulate x 30' using LRAD with Mod-Assist x 2 PT Short Term Goal 3 - Progress (Week 1): Met  Skilled Therapeutic Interventions/Progress Updates:    Gait training 2 x 130' without walker, with Rt. Knee brace. Close Min assist for overall instability and  2 instances of knee buckling however pt able to self correct. Some excessive adduction of Rt. LE, cues for "stepping wide" with Rt. LE to improve balance. NuStep x 15 min level 6 progressing to level 7, bil. hands strapped to handles. RPE 13.  Bil. LE strengthening: 3 x 10 reps mini squats + 5# weight. Side stepping with orange band resistance around knee.   Therapy Documentation Precautions:  Precautions Precautions: Fall Precaution Comments: s/p ACDF and has weakness in B UE's and  Required Braces or Orthoses: Cervical Brace Cervical Brace: Hard collar Restrictions Weight Bearing Restrictions: No Vital Signs: Therapy Vitals BP: 107/78 mmHg Pain: Pain Assessment Pain Assessment: 0-10 Pain Score:   6 Pain Type: Neuropathic pain Pain Location: Hand Pain Orientation: Right;Left Pain Descriptors: Aching;Burning Pain Onset: On-going Pain Intervention(s): Other (Comment) (RN bringing medication) Locomotion : Ambulation Ambulation/Gait Assistance: 4: Min assist  See FIM for current functional status  Therapy/Group: Individual Therapy  [name] 03/26/2012, 12:32 PM

## 2012-03-26 NOTE — Progress Notes (Signed)
Social Work Patient ID: Edward [name], male   DOB: [birth year], 61 y.o.   MRN: [id number] Insurance update sent to [facility].

## 2012-03-26 NOTE — Progress Notes (Signed)
Physical Therapy Session Note  Patient Details  Name: Edward [name] MRN: [id number] Date of Birth: 10-20-1950  Today's Date: 03/26/2012 Time: 4782-9562 Time Calculation (min): 29 min  Short Term Goals: Week 1:  PT Short Term Goal 1 (Week 1): Patient will be able to perform bed mobility with min-A PT Short Term Goal 1 - Progress (Week 1): Met PT Short Term Goal 2 (Week 1): Patient will be able to perform transfers with min-A PT Short Term Goal 2 - Progress (Week 1): Met PT Short Term Goal 3 (Week 1): Patient will be able to ambulate x 30' using LRAD with Mod-Assist x 2 PT Short Term Goal 3 - Progress (Week 1): Met  Skilled Therapeutic Interventions/Progress Updates:    Wife donning knee brace with moderate assist and verbal cues. Neuromuscular Rehabilitation working modulated knee control with stepping while tossing or bounce passing basketball with recreational therapist. Added twisting of upper trunk with base of support in modified tandem to increase difficulty - overall min assist without UE support. Pt required verbal cues to lock brakes on wheelchair once and verbal cues to back completely to chair prior to sitting multiple times indicating slightly decreased safety awareness.   Therapy Documentation Precautions:  Precautions Precautions: Fall Precaution Comments: s/p ACDF and has weakness in B UE's and  Required Braces or Orthoses: Cervical Brace Cervical Brace: Hard collar Restrictions Weight Bearing Restrictions: No Pain: Pain Assessment Pain Assessment: No/denies pain  See FIM for current functional status  Therapy/Group: Co-Treatment with Recreational Therapy  [name] 03/26/2012, 5:48 PM

## 2012-03-26 NOTE — Progress Notes (Signed)
Occupational Therapy Note  Patient Details  Name: Edward [name] MRN: [id number] Date of Birth: February 24, 1951 Today's Date: 03/26/2012  Time: 1300-1345 Pt denies pain Individual therapy Pt amb without AD to gym from room with steady/min A.  Pt exhibited LOB X 4 but able to self correct.  Pt transitioned to BUE/hand activities with emphasis on FM/manipulation tasks including removing nuts from bolts and replacing them.  Pt also engaged in removing items attached with velcro to strengthen pincher grip and increase strength in fingers.  Pt exhibited increased difficulty with TM tasks with left hand.   [name] 03/26/2012, 2:15 PM

## 2012-03-26 NOTE — Progress Notes (Signed)
Orthopedic Tech Progress Note Patient Details:  DRAYDON CLAIRMONT 06/07/1951 [id number]  Patient ID: Judie Grieve, male   DOB: October 23, 1950, 61 y.o.   MRN: [id number] Called advanced @1430   Nikki Dom 03/26/2012, 2:58 PM

## 2012-03-26 NOTE — Progress Notes (Signed)
Social Work Patient ID: Edward [name], male   DOB: May 03, 1951, 61 y.o.   MRN: [id number] Met with pt and wife to inform of team conference progression toward goals and discharge.  Pt is meeting his goals sooner than expected and discharge moved up to 9/4. Wife will need to come in for more therapies and plans too.  Discussed OP therapies recommended and will proceed with making referral, both agreeable. Pt is pleased with how he is progressing and looking forward to going home.  Await team's DME recommendations.

## 2012-03-27 ENCOUNTER — Inpatient Hospital Stay (HOSPITAL_COMMUNITY): Payer: Federal, State, Local not specified - PPO | Admitting: Occupational Therapy

## 2012-03-27 ENCOUNTER — Inpatient Hospital Stay (HOSPITAL_COMMUNITY): Payer: Federal, State, Local not specified - PPO

## 2012-03-27 ENCOUNTER — Inpatient Hospital Stay (HOSPITAL_COMMUNITY): Payer: Federal, State, Local not specified - PPO | Admitting: Physical Therapy

## 2012-03-27 MED ORDER — LISINOPRIL 10 MG PO TABS
10.0000 mg | ORAL_TABLET | Freq: Every day | ORAL | Status: DC
  Administered 2012-03-27 – 2012-04-03 (×8): 10 mg via ORAL
  Filled 2012-03-27 (×10): qty 1

## 2012-03-27 NOTE — Progress Notes (Signed)
Occupational Therapy Session Note  Patient Details  Name: Edward [name] MRN: [id number] Date of Birth: [birth year]  Today's Date: 03/27/2012 Time: 0700-0800 Time Calculation (min): 60 min   Skilled Therapeutic Interventions/Progress Updates:    Pt engaged in ADL retraining including bathing at shower level and dressing with sit<>stand from EOB.  Pt amb with PFRW to bathroom to complete bathing tasks.  Pt utilizing wash cloth vs bath mitt and is able to adequately grasp/manipulate cloth to complete bathing tasks.  Pt completed all dressing tasks including donning socks and pulling up pants with sit<>stand from EOB.  Pt remained seated EOB to eat breakfast.  Pt opened butter container and removed lid from orange juice without assistance.  Pt manipulates utensils between fingers and is able to self feed without assistive device.  Focus on increased BUE use with focus on grasp/release, RW safety awareness, and standing balance.  Therapy Documentation Precautions:  Precautions Precautions: Fall Precaution Comments: s/p ACDF and has weakness in B UE's and  Required Braces or Orthoses: Cervical Brace Cervical Brace: Hard collar Restrictions Weight Bearing Restrictions: No   Pain: Pain Assessment Pain Assessment: No/denies pain  See FIM for current functional status  Therapy/Group: Individual Therapy  Rich Brave 03/27/2012, 7:59 AM

## 2012-03-27 NOTE — Progress Notes (Signed)
Physical Therapy Note  Patient Details  Name: AVYN COATE MRN: [id number] Date of Birth: [birth year] Today's Date: 03/27/2012  7829-5621 (60 minutes) individual Pain: 8/10 bilateral hands/ nurse notified/meds given Focus of treatment: Therapeutic exercises/activities/ gait training focused on RT terminal knee extension control in stance Treatment: Gait without AD 120 feet X 2 min assist using recurvatum brace RT knee (increased trunk lean to right in stance); Kinetron in standing with/without UE support; Gait sideways using orange theraband for resistance min assist with reminder cues for terminal knee extension RT in stance; Gait forward/backward min assist .   1600-1640 (40 minutes) individual Pain: no complaint of pain Focus of treatment: therapeutic exercises RT LE focused on terminal knee extension control/strengthening; gait training without AD and recurvatum brace RT knee Treatment: Nustep LEVEL 7 LEs only X 15 minutes (percieived exertion = 13 somewhat hard); gait 240 feet min assist without AD with minimal instability RT Knee in stance (25%); up/down 4 inch step RT LE only X 10 min assist with occasional vcs for terminal knee extension; up /down 4 inch step sideways RT LE only with difficulty attaining full terminal knee extension in stance.,      7HOUT,JIM 03/27/2012, 9:49 AM

## 2012-03-27 NOTE — Progress Notes (Signed)
Patient ID: Edward [name], male   DOB: 03/18/1951, 61 y.o.   MRN: [id number]  Subjective/Complaints: No problems yesterday.  A 12 point review of systems has been performed and if not noted above is otherwise negative.   Objective: Vital Signs: Blood pressure 153/90, pulse 67, temperature 98.5 F (36.9 C), temperature source Oral, resp. rate 19, weight 90.311 kg (199 lb 1.6 oz), SpO2 100.00%. No results found. No results found for this or any previous visit (from the past 72 hour(s)).    General: Alert and oriented x 3, No apparent distress HEENT: Head is normocephalic, atraumatic, PERRLA, EOMI, sclera anicteric, oral mucosa pink and moist, dentition intact, ext ear canals clear, nose dressed, sutures in place Neck: Supple without JVD or lymphadenopathy Heart: Reg rate and rhythm. No murmurs rubs or gallops Chest: CTA bilaterally without wheezes, rales, or rhonchi; no distress Abdomen: Soft, non-tender, non-distended, bowel sounds positive. Extremities: No clubbing, cyanosis, or edema. Pulses are 2+ Skin: Clean and intact without signs of breakdown Neuro: Pt is cognitively appropriate with normal insight, memory, and awareness. Cranial nerves 2-12 are intact. Sensory exam is 1/2 in hands and feet.  Biceps and deltoids are 3 to 3+. Triceps 2-3, WE and WF are 1-2. HI are trace to 1+, right hand is stronger.  Lower ext are 4- to 4/5.  Musculoskeletal: Full ROM, No pain with AROM or PROM in the neck, trunk, or extremities. Posture appropriate. No LE edema.  Feet warm to touch.  Bilateral hands with minimal edema.  Psych: Pt's affect is appropriate. Pt is cooperative     Assessment/Plan: 1. Functional deficits secondary to Central cord syndrome after fall s/p ACDF 4-5 which require 3+ hours per day of interdisciplinary therapy in a comprehensive inpatient rehab setting. Physiatrist is providing close team supervision and 24 hour management of active medical problems listed  below. Physiatrist and rehab team continue to assess barriers to discharge/monitor patient progress toward functional and medical goals.   he may only remove collar when supine in bed. We cannot risk another incident like yesterday.  Knee cage per [facility]  FIM: FIM - Bathing Bathing Steps Patient Completed: Chest;Right Arm;Left Arm;Abdomen;Front perineal area;Buttocks;Right upper leg;Left upper leg;Right lower leg (including foot);Left lower leg (including foot) Bathing: 5: Supervision: Safety issues/verbal cues  FIM - Upper Body Dressing/Undressing Upper body dressing/undressing steps patient completed: Thread/unthread right sleeve of pullover shirt/dresss;Thread/unthread left sleeve of pullover shirt/dress;Put head through opening of pull over shirt/dress;Pull shirt over trunk Upper body dressing/undressing: 4: Steadying assist FIM - Lower Body Dressing/Undressing Lower body dressing/undressing steps patient completed: Thread/unthread right pants leg;Thread/unthread left pants leg;Pull pants up/down;Don/Doff right sock;Don/Doff left sock Lower body dressing/undressing: 4: Steadying Assist  FIM - Toileting Toileting steps completed by patient: Adjust clothing prior to toileting;Performs perineal hygiene Toileting Assistive Devices: Grab bar or rail for support Toileting: 0: Activity did not occur  FIM - Diplomatic Services operational officer Devices: Therapist, music Transfers: 0-Activity did not occur  FIM - Banker Devices: Bed rails Bed/Chair Transfer: 6: Supine > Sit: No assist;5: Bed > Chair or W/C: Supervision (verbal cues/safety issues);5: Chair or W/C > Bed: Supervision (verbal cues/safety issues)  FIM - Locomotion: Wheelchair Distance: 20 Locomotion: Wheelchair: 0: Activity did not occur FIM - Locomotion: Ambulation Locomotion: Ambulation Assistive Devices: Environmental consultant - Platform (bil. and knee cage) Ambulation/Gait  Assistance: 4: Min assist Locomotion: Ambulation: 2: Travels 50 - 149 ft with minimal assistance (Pt.>75%)  Comprehension Comprehension Mode: Auditory Comprehension: 7-Follows  complex conversation/direction: With no assist  Expression Expression Mode: Verbal Expression: 7-Expresses complex ideas: With no assist  Social Interaction Social Interaction: 7-Interacts appropriately with others - No medications needed.  Problem Solving Problem Solving: 7-Solves complex problems: Recognizes & self-corrects  Memory Memory: 7-Complete Independence: No helper   Medical Problem List and Plan:  1. DVT Prophylaxis/Anticoagulation: Mechanical: Sequential compression devices, below knee Bilateral lower extremities, movibility 2. Pain Management: neurontin 200 tid helping dysesthesias 3. Mood: motivated. Monitor for now.  4. Neuropsych: This patient is capable of making decisions on his/her own behalf.  5. HTN: BP has been variable. Monitor with bid checks. Resumed BB and Norvasc -bp under reasonable control. Started prinivil-will titrate up to 10mg   6. CAD: monitor for symptoms. Resume low dose toprol and zocor.  8. Constipation: on Senna S and miralax bid.   LOS (Days) 12 A FACE TO FACE EVALUATION WAS PERFORMED  SWARTZ,ZACHARY T 03/27/2012, 8:17 AM

## 2012-03-27 NOTE — Progress Notes (Signed)
Occupational Therapy Session Note  Patient Details  Name: Edward [name] MRN: [id number] Date of Birth: 1950/12/10  Today's Date: 03/27/2012 Time: 1500-1530 Time Calculation (min): 30 min  Skilled Therapeutic Interventions:  Patient in w/c upon arrival. Focused session on donning socks and shoes and especially tie shoes.  Patient reports he has not yet tried to tie his shoes therefore wanted to attempt even though his hands/fingers were tight and a little painful.  Patient required assistance to don socks this PM yet states that he normally is able to do this task without assistance.  Patient able to don both shoes then proceeded to tie his left shoe after two tries yet unable to tie his right shoe.  Provided rest break, warm pack for hands and patient stretched hands.  Patient able to tie right shoe after first try!  Patient also performed BUE exercises while resistance was applied for elbow ext., supination, pronation, wrist ext and flex.  Reminded patient to begin working on some core exercises himself and while in other therapy sessions as well as provided patient and wife with some additional ideas to work with his hands to improve function.  Therapy Documentation Precautions:  Precautions Precautions: Fall Precaution Comments: s/p ACDF and has weakness in B UE's and  Required Braces or Orthoses: Cervical Brace Cervical Brace: Hard collar Restrictions Weight Bearing Restrictions: No Pain: C/o pain in bilateral hands and fingers, not rated, premedicated.  Provided warm pack and exercises and patient reports some relief.  See FIM for current functional status  Therapy/Group: Individual Therapy  Edward [name] 03/27/2012, 5:30 PM

## 2012-03-28 ENCOUNTER — Inpatient Hospital Stay (HOSPITAL_COMMUNITY): Payer: Federal, State, Local not specified - PPO

## 2012-03-28 ENCOUNTER — Inpatient Hospital Stay (HOSPITAL_COMMUNITY): Payer: Federal, State, Local not specified - PPO | Admitting: Physical Therapy

## 2012-03-28 NOTE — Progress Notes (Signed)
Physical Therapy Session Note  Patient Details  Name: SIDHANT HELDERMAN MRN: [id number] Date of Birth: 1950-10-24  Today's Date: 03/28/2012 Time: 4098-1191 Time Calculation (min): 61 min  Skilled Therapeutic Interventions/Progress Updates:    - Gait training without assistive device pt requires nearly constant min assist, decreased awareness of losses of balance that required moderate assist to correct. Pt unaware of Rt. LE scissoring at times.   - Gait training in home environment without RW: side stepping, walking backwards, and working on turns over carpet, min assist.  - Gait training with RW, no platforms. Min-guard assist up to min assist for one loss of balance, much improved overall balance noted. Pt reporting he feels more safe with RW at this point but would like to continue to work without RW for long term goal of return to prior level of function.. - NuStep x 16 min level 7, RPE = 13. Performed with bil. Hands strapped into hand grips however pt performed trial without grips at end, able to hold onto handles.   Therapy Documentation Precautions:  Precautions Precautions: Fall Precaution Comments: s/p ACDF and has weakness in B UE's and  Required Braces or Orthoses: Cervical Brace Cervical Brace: Hard collar Restrictions Weight Bearing Restrictions: No Vital Signs: Therapy Vitals BP: 118/68 mmHg Patient Position, if appropriate: Lying Pain: Pain Assessment Pain Assessment: No/denies pain  Locomotion : Ambulation Ambulation/Gait Assistance: 3: Mod assist   See FIM for current functional status  Therapy/Group: Individual Therapy  [name] 03/28/2012, 12:13 PM

## 2012-03-28 NOTE — Progress Notes (Signed)
Physical Therapy Session Note  Patient Details  Name: Edward [name] MRN: [id number] Date of Birth: 1951-04-27  Today's Date: 03/28/2012 Time: 4098-1191 Time Calculation (min): 42 min  Skilled Therapeutic Interventions/Progress Updates:    Wheelchair mobility x 150' with modified independence, pt does not desire to take wheelchair home. Wheelchair goals updated. Gait training with new brace x 100', fits pt well and decreased overall instabilty noted. Floor transfers x 2 reps with supervision. Obstacle course negotiating obstacles and stepping over 1" obstacle performed with min-guard assist. Wife arrived at end of session. Gait x 160' with RW, wife providing min-guard assist, therapist also by side as precaution. No significant scissoring or loss of balance this session.    Therapy Documentation Precautions:  Precautions Precautions: Fall Precaution Comments: s/p ACDF and has weakness in B UE's and  Required Braces or Orthoses: Cervical Brace Cervical Brace: Hard collar Restrictions Weight Bearing Restrictions: No Pain: Pain Assessment Pain Assessment: No/denies pain  See FIM for current functional status  Therapy/Group: Individual Therapy  [name] 03/28/2012, 5:36 PM

## 2012-03-28 NOTE — Progress Notes (Signed)
Occupational Therapy Session Note  Patient Details  Name: Edward [name] MRN: [id number] Date of Birth: 1950-08-01  Today's Date: 03/28/2012  Session 1 Time: 0700-0800 Time Calculation (min): 60 min  Short Term Goals: Week 2:     Skilled Therapeutic Interventions/Progress Updates:    Pt amb with PFRW to bathroom to complete bathing tasks seated on tub bench in walk in shower.  Pt completed all task using wash cloth with supervision.  Pt completed dressing tasks seated EOB.  Pt able to don socks and shoes without assistance.  Pt demonstrated ability to manipulate shoe strings to form a bow but unable to pull shoe strings tight enough and required assistance.  Pt completed self feeding tasks with no assistance.  Pt stated his fingers felt better today but thumb and index finger still felt stiff.  Pt exhibited increased active range with digits 3-5 on both hands this morning.  Therapy Documentation Precautions:  Precautions Precautions: Fall Precaution Comments: s/p ACDF and has weakness in B UE's and  Required Braces or Orthoses: Cervical Brace Cervical Brace: Hard collar Restrictions Weight Bearing Restrictions: No General:   Pain: Pain Assessment Pain Assessment: No/denies pain  See FIM for current functional status  Therapy/Group: Individual Therapy  Session 2 Time: 1400-1430 Individual Therapy Pt amb with PFRW to gym (knee cage in place) at supervision level to practice dexterity/manipulation activities with small objects and theraputty.  Pt amb back to room without AD and steady assist.  Focus on functional ambulation and bilateral hand dexterity.  [name] [facility] 03/28/2012, 8:02 AM

## 2012-03-28 NOTE — Progress Notes (Signed)
Patient ID: Edward [name], male   DOB: 1951-03-04, 61 y.o.   MRN: [id number]  Subjective/Complaints: No problems yesterday.  A 12 point review of systems has been performed and if not noted above is otherwise negative.   Objective: Vital Signs: Blood pressure 127/81, pulse 90, temperature 98.2 F (36.8 C), temperature source Oral, resp. rate 18, weight 90.311 kg (199 lb 1.6 oz), SpO2 100.00%. No results found. No results found for this or any previous visit (from the past 72 hour(s)).    General: Alert and oriented x 3, No apparent distress HEENT: Head is normocephalic, atraumatic, PERRLA, EOMI, sclera anicteric, oral mucosa pink and moist, dentition intact, ext ear canals clear, nose dressed, sutures in place Neck: Supple without JVD or lymphadenopathy Heart: Reg rate and rhythm. No murmurs rubs or gallops Chest: CTA bilaterally without wheezes, rales, or rhonchi; no distress Abdomen: Soft, non-tender, non-distended, bowel sounds positive. Extremities: No clubbing, cyanosis, or edema. Pulses are 2+ Skin: Clean and intact without signs of breakdown Neuro: Pt is cognitively appropriate with normal insight, memory, and awareness. Cranial nerves 2-12 are intact. Sensory exam is 1/2 in hands and feet.  Biceps and deltoids are 3 to 3+. Triceps 2-3, WE and WF are 1-2.HI are 2-3/5, right hand is stronger.  Lower ext are 4- to 4/5.  Musculoskeletal: Full ROM, No pain with AROM or PROM in the neck, trunk, or extremities. Posture appropriate. No LE edema.  Feet warm to touch.  Bilateral hands with minimal edema.  Psych: Pt's affect is appropriate. Pt is cooperative     Assessment/Plan: 1. Functional deficits secondary to Central cord syndrome after fall s/p ACDF 4-5 which require 3+ hours per day of interdisciplinary therapy in a comprehensive inpatient rehab setting. Physiatrist is providing close team supervision and 24 hour management of active medical problems listed below. Physiatrist  and rehab team continue to assess barriers to discharge/monitor patient progress toward functional and medical goals.   he may only remove collar when supine in bed. We cannot risk another incident like yesterday.  Knee cage per [employer], measuring today  FIM: FIM - Bathing Bathing Steps Patient Completed: Chest;Right Arm;Left Arm;Abdomen;Front perineal area;Buttocks;Right upper leg;Left upper leg;Right lower leg (including foot);Left lower leg (including foot) Bathing: 5: Supervision: Safety issues/verbal cues  FIM - Upper Body Dressing/Undressing Upper body dressing/undressing steps patient completed: Thread/unthread right sleeve of pullover shirt/dresss;Thread/unthread left sleeve of pullover shirt/dress;Put head through opening of pull over shirt/dress;Pull shirt over trunk Upper body dressing/undressing: 4: Steadying assist FIM - Lower Body Dressing/Undressing Lower body dressing/undressing steps patient completed: Thread/unthread right pants leg;Thread/unthread left pants leg;Pull pants up/down;Don/Doff right sock;Don/Doff left sock Lower body dressing/undressing: 4: Steadying Assist  FIM - Toileting Toileting steps completed by patient: Adjust clothing prior to toileting;Performs perineal hygiene Toileting Assistive Devices: Grab bar or rail for support Toileting: 0: Activity did not occur  FIM - Diplomatic Services operational officer Devices: Therapist, music Transfers: 0-Activity did not occur  FIM - Banker Devices: Bed rails Bed/Chair Transfer: 6: Supine > Sit: No assist;5: Bed > Chair or W/C: Supervision (verbal cues/safety issues);5: Chair or W/C > Bed: Supervision (verbal cues/safety issues)  FIM - Locomotion: Wheelchair Distance: 20 Locomotion: Wheelchair: 0: Activity did not occur FIM - Locomotion: Ambulation Locomotion: Ambulation Assistive Devices: Environmental consultant - Platform (bil. and knee cage) Ambulation/Gait  Assistance: 4: Min assist Locomotion: Ambulation: 2: Travels 50 - 149 ft with minimal assistance (Pt.>75%)  Comprehension Comprehension Mode: Auditory Comprehension: 7-Follows complex  conversation/direction: With no assist  Expression Expression Mode: Verbal Expression: 7-Expresses complex ideas: With no assist  Social Interaction Social Interaction: 7-Interacts appropriately with others - No medications needed.  Problem Solving Problem Solving: 7-Solves complex problems: Recognizes & self-corrects  Memory Memory: 7-Complete Independence: No helper   Medical Problem List and Plan:  1. DVT Prophylaxis/Anticoagulation: Mechanical: Sequential compression devices, below knee Bilateral lower extremities, movibility 2. Pain Management: neurontin 200 tid helping dysesthesias 3. Mood: motivated. Monitor for now.  4. Neuropsych: This patient is capable of making decisions on his/her own behalf.  5. HTN: BP has been variable. Monitor with bid checks. Resumed BB and Norvasc -bp under reasonable control. Started prinivil-will titrate up to 10mg   6. CAD: monitor for symptoms. Resume low dose toprol and zocor.  8. Constipation: on Senna S and miralax bid.   LOS (Days) 13 A FACE TO FACE EVALUATION WAS PERFORMED  SWARTZ,ZACHARY T 03/28/2012, 7:49 AM

## 2012-03-29 ENCOUNTER — Inpatient Hospital Stay (HOSPITAL_COMMUNITY): Payer: Federal, State, Local not specified - PPO | Admitting: Physical Therapy

## 2012-03-29 ENCOUNTER — Inpatient Hospital Stay (HOSPITAL_COMMUNITY): Payer: Federal, State, Local not specified - PPO

## 2012-03-29 NOTE — Progress Notes (Signed)
Occupational Therapy Note  Patient Details  Name: Edward [name] MRN: [id number] Date of Birth: 07-Aug-1950 Today's Date: 03/29/2012  Time:1000-1045 Pt denies pain Individual therapy  Pt amb with RW to gym to engage in BUE/hand activities including assembling/disassembling pipe tree, opening lotion containers, and removing small objects from theraputty and replacing them in container.  Pt assisted with removing RLE brace by pulling velcro straps.  Pt exhibiting increased strength and dexterity in index finger and thumb,  Rich Brave 03/29/2012, 10:34 AM

## 2012-03-29 NOTE — Progress Notes (Signed)
Occupational Therapy Weekly Progress Note  Patient Details  Name: Edward [name] MRN: [id number] Date of Birth: 1951/03/18  Today's Date: 03/29/2012  Pt has made steady gains with BADLs and transfers this past week with increased function of bilateral hands in grasping utensils, clothing, toothbrush, and objects.  Pt is supervision for bathing at shower level and requires assistance only with tieing shoes during dressing.  Pt requires steady A for functional ambulation with RW and during transfers.  Patient continues to demonstrate the following deficits: decreased functional mobility, decreased functional use of bilateral UEs, decreased dynamic standing balance. Therefore, patient will continue to benefit from skilled OT intervention to enhance overall performance with BADL, iADL and Reduce care partner burden.  Patient progressing toward long term goals..  Continue plan of care.  OT Short Term Goals STG = LTG secondary to ELOS  Skilled Therapeutic Interventions/Progress Updates:  Balance/vestibular training;Community reintegration;Discharge planning;DME/adaptive equipment instruction;Functional mobility training;Neuromuscular re-education;Pain management;Patient/family education;Psychosocial support;Self Care/advanced ADL retraining;Skin care/wound managment;Splinting/orthotics;Therapeutic Activities;Therapeutic Exercise;UE/LE Strength taining/ROM;UE/LE Coordination activities;Wheelchair propulsion/positioning   Precautions:  Precautions Precautions: Fall Precaution Comments: s/p ACDF and has weakness in B UE's and  Required Braces or Orthoses: Cervical Brace Cervical Brace: Hard collar Restrictions Weight Bearing Restrictions: No  See FIM for current functional status  [name] 03/29/2012, 11:53 AM

## 2012-03-29 NOTE — Progress Notes (Signed)
Physical Therapy Weekly Progress Note  Patient Details  Name: Edward [name] MRN: [id number] Date of Birth: April 26, 1951  Today's Date: 03/29/2012 Time: 4098-1191 Time Calculation (min): 56 min  Patient has met 3 of 3 short term goals.    Patient continues to demonstrate the following deficits: bil. LE and UE weakness, significantly impaired balance and proprioception, increased need for assist, and remains at a high risk for falls and therefore will continue to benefit from skilled PT intervention to enhance overall performance with activity tolerance, balance, ability to compensate for deficits, functional use of  right upper extremity, right lower extremity, left upper extremity and left lower extremity and coordination.  Patient progressing toward long term goals..  Continue plan of care. Long term goals updated secondary to good progress.  PT Short Term Goals Week 2:  PT Short Term Goal 1 (Week 2): Pt will ambulate x 100' with LRAD with min assist PT Short Term Goal 1 - Progress (Week 2): Met PT Short Term Goal 2 (Week 2): Pt will perform sit <> stand with supervision PT Short Term Goal 2 - Progress (Week 2): Met PT Short Term Goal 3 (Week 2): Pt will perform dynamic balance activities without UE support with moderate assist.  PT Short Term Goal 3 - Progress (Week 2): Met  Skilled Therapeutic Interventions/Progress Updates:    Gait training + cognitive distraction x 100', 150' with RW and Rt. Knee cage, min-guard assist. Flight of stairs performed without rest, min assist. Pt still needs verbal cues for safe sequencing, proximity to rail, and extension of Lt. LE prior to advancement of Rt. LE. Standing balance activity shooting basketball, tossing ball with trampoline, and standing on compliant surface with varied bases of support - min assist for balance. No significant balance losses this morning.    Therapy Documentation Precautions:  Precautions Precautions: Fall Precaution  Comments: s/p ACDF and has weakness in B UE's and  Required Braces or Orthoses: Cervical Brace Cervical Brace: Hard collar Restrictions Weight Bearing Restrictions: No Pain: Pain Assessment Pain Assessment: 0-10 Pain Score:   4 Pain Type: Neuropathic pain Pain Location: Hand Pain Orientation: Right;Left Pain Descriptors:  (stiffness) Pain Frequency: Intermittent Pain Onset: On-going Patients Stated Pain Goal: 3 Pain Intervention(s): Other (Comment) (declined pain medicine) Locomotion : Ambulation Ambulation/Gait Assistance: 4: Min guard   See FIM for current functional status  Therapy/Group: Individual Therapy  [name] 03/29/2012, 9:41 AM

## 2012-03-29 NOTE — Progress Notes (Signed)
Occupational Therapy Session Note  Patient Details  Name: Edward [name] MRN: [id number] Date of Birth: 11/29/1950  Today's Date: 03/29/2012 Time: 0700-0755 Time Calculation (min): 55 min   Skilled Therapeutic Interventions/Progress Updates:    Pt engaged in ADL retraining including bathing at shower level and dressing from EOB.  Pt amb with RW to walk-in shower; pt able to negotiate obstacles and varying surface heights with steady assist.  Pt completed all bathing and dressing tasks with supervision requiring assistance only with fastening shoes.  Pt completed grooming tasks standing at sink with steady assist only.  Pt able to grasp toothbrush without adaptations.  Pt required assistance opening milk container only prior to eating breakfast.  Focus on functional ambulation with RW, increased use of BUE/hands for BADLs, and standing balance.  Therapy Documentation Precautions:  Precautions Precautions: Fall Precaution Comments: s/p ACDF and has weakness in B UE's and  Required Braces or Orthoses: Cervical Brace Cervical Brace: Hard collar Restrictions Weight Bearing Restrictions: No Pain: Pain Assessment Pain Assessment: No/denies pain  See FIM for current functional status  Therapy/Group: Individual Therapy  Rich Brave 03/29/2012, 7:56 AM

## 2012-03-29 NOTE — Progress Notes (Signed)
Patient ID: Edward [name], male   DOB: 1950/11/11, 61 y.o.   MRN: [id number]  Subjective/Complaints: No problems yesterday. Slept well. A 12 point review of systems has been performed and if not noted above is otherwise negative.   Objective: Vital Signs: Blood pressure 123/76, pulse 73, temperature 98.4 F (36.9 C), temperature source Oral, resp. rate 18, weight 90.311 kg (199 lb 1.6 oz), SpO2 100.00%. No results found. No results found for this or any previous visit (from the past 72 hour(s)).    General: Alert and oriented x 3, No apparent distress HEENT: Head is normocephalic, atraumatic, PERRLA, EOMI, sclera anicteric, oral mucosa pink and moist, dentition intact, ext ear canals clear, nose dressed, sutures in place Neck: Supple without JVD or lymphadenopathy Heart: Reg rate and rhythm. No murmurs rubs or gallops Chest: CTA bilaterally without wheezes, rales, or rhonchi; no distress Abdomen: Soft, non-tender, non-distended, bowel sounds positive. Extremities: No clubbing, cyanosis, or edema. Pulses are 2+ Skin: Clean and intact without signs of breakdown Neuro: Pt is cognitively appropriate with normal insight, memory, and awareness. Cranial nerves 2-12 are intact. Sensory exam is 1/2 in hands and feet.  Biceps and deltoids are 3 to 3+. Triceps 2-3, WE and WF are 1-2.HI are 2-3/5, right hand is stronger.  Lower ext are 4- to 4/5.  Musculoskeletal: Full ROM, No pain with AROM or PROM in the neck, trunk, or extremities. Posture appropriate. No LE edema.  Feet warm to touch.  Bilateral hands with minimal edema.  Psych: Pt's affect is appropriate. Pt is cooperative     Assessment/Plan: 1. Functional deficits secondary to Central cord syndrome after fall s/p ACDF 4-5 which require 3+ hours per day of interdisciplinary therapy in a comprehensive inpatient rehab setting. Physiatrist is providing close team supervision and 24 hour management of active medical problems listed  below. Physiatrist and rehab team continue to assess barriers to discharge/monitor patient progress toward functional and medical goals.   he may only remove collar when supine in bed. We cannot risk another incident like yesterday.  Knee cage per [facility] was delivered yesterday and patient was pleased with fit and functionality with brace.  FIM: FIM - Bathing Bathing Steps Patient Completed: Chest;Right Arm;Left Arm;Abdomen;Front perineal area;Buttocks;Right upper leg;Left upper leg;Right lower leg (including foot);Left lower leg (including foot) Bathing: 5: Supervision: Safety issues/verbal cues  FIM - Upper Body Dressing/Undressing Upper body dressing/undressing steps patient completed: Thread/unthread right sleeve of pullover shirt/dresss;Thread/unthread left sleeve of pullover shirt/dress;Put head through opening of pull over shirt/dress;Pull shirt over trunk Upper body dressing/undressing: 5: Set-up assist to: Apply TLSO, cervical collar FIM - Lower Body Dressing/Undressing Lower body dressing/undressing steps patient completed: Thread/unthread right pants leg;Thread/unthread left pants leg;Pull pants up/down;Don/Doff right sock;Don/Doff left sock;Don/Doff right shoe;Don/Doff left shoe Lower body dressing/undressing: 4: Min-Patient completed 75 plus % of tasks  FIM - Toileting Toileting steps completed by patient: Adjust clothing prior to toileting;Performs perineal hygiene;Adjust clothing after toileting Toileting Assistive Devices: Grab bar or rail for support Toileting: 5: Supervision: Safety issues/verbal cues  FIM - Diplomatic Services operational officer Devices: Grab bars Toilet Transfers: 5-To toilet/BSC: Supervision (verbal cues/safety issues);5-From toilet/BSC: Supervision (verbal cues/safety issues)  FIM - Press photographer Assistive Devices: Bed rails Bed/Chair Transfer: 6: Supine > Sit: No assist;6: Sit > Supine: No assist;4: Bed >  Chair or W/C: Min A (steadying Pt. > 75%);4: Chair or W/C > Bed: Min A (steadying Pt. > 75%)  FIM - Locomotion: Wheelchair Distance: 20 Locomotion: Wheelchair:  0: Activity did not occur FIM - Locomotion: Ambulation Locomotion: Ambulation Assistive Devices: Walker - Rolling (Rt. knee cage) Ambulation/Gait Assistance: 3: Mod assist Locomotion: Ambulation: 4: Travels 150 ft or more with minimal assistance (Pt.>75%)  Comprehension Comprehension Mode: Auditory Comprehension: 7-Follows complex conversation/direction: With no assist  Expression Expression Mode: Verbal Expression: 7-Expresses complex ideas: With no assist  Social Interaction Social Interaction: 7-Interacts appropriately with others - No medications needed.  Problem Solving Problem Solving: 7-Solves complex problems: Recognizes & self-corrects  Memory Memory: 7-Complete Independence: No helper   Medical Problem List and Plan:  1. DVT Prophylaxis/Anticoagulation: Mechanical: Sequential compression devices, below knee Bilateral lower extremities, movibility 2. Pain Management: neurontin 200 tid helping dysesthesias 3. Mood: motivated. Monitor for now.  4. Neuropsych: This patient is capable of making decisions on his/her own behalf.  5. HTN: BP has been variable. Monitor with bid checks. Resumed BB and Norvasc -bp under reasonable control. Started prinivil-will titrate up to 10mg   6. CAD: monitor for symptoms. Resume low dose toprol and zocor.  8. Constipation: on Senna S and miralax bid.   LOS (Days) 14 A FACE TO FACE EVALUATION WAS PERFORMED  Edward [name] T 03/29/2012, 6:21 AM

## 2012-03-29 NOTE — Progress Notes (Signed)
Physical Therapy Session Note  Patient Details  Name: Edward [name] MRN: [id number] Date of Birth: July 06, 1951  Today's Date: 03/29/2012 Time: 4098-1191 Time Calculation (min): 30 min  Short Term Goals: Week 2:  PT Short Term Goal 1 (Week 2): Pt will ambulate x 100' with LRAD with min assist PT Short Term Goal 1 - Progress (Week 2): Met PT Short Term Goal 2 (Week 2): Pt will perform sit <> stand with supervision PT Short Term Goal 2 - Progress (Week 2): Met PT Short Term Goal 3 (Week 2): Pt will perform dynamic balance activities without UE support with moderate assist.  PT Short Term Goal 3 - Progress (Week 2): Met  Skilled Therapeutic Interventions/Progress Updates:    Session focused on gait training with RW over carpeted surfaces. Practiced tight space negotiation, ambulating backwards, sidestepping, and negotiation of randomly placed obstacles. Pt requires overall min assist, walking backwards difficult for pt and cues needed for safe sequencing and placement of RW. Pt performed sit <> stand from variety of surfaces, supervision level. Pt able to assist with donning of Rt. LE knee brace, worn for entire session. Will benefit from further gait training without RW to decrease reliance on the device.   Therapy Documentation Precautions:  Precautions Precautions: Fall Precaution Comments: s/p ACDF and has weakness in B UE's and  Required Braces or Orthoses: Cervical Brace Cervical Brace: Hard collar Restrictions Weight Bearing Restrictions: No Pain: No c/o pain during therapy  See FIM for current functional status  Therapy/Group: Individual Therapy  [name] 03/29/2012, 5:35 PM

## 2012-03-30 ENCOUNTER — Inpatient Hospital Stay (HOSPITAL_COMMUNITY): Payer: Federal, State, Local not specified - PPO | Admitting: Physical Therapy

## 2012-03-30 NOTE — Plan of Care (Signed)
Problem: RH PAIN MANAGEMENT Goal: RH STG PAIN MANAGED AT OR BELOW PT'S PAIN GOAL Keep pain at less than 3 out of 10  Outcome: Not Progressing Patient pain not lower than 7 out of 10 rate reported  with PRN pain medication

## 2012-03-30 NOTE — Progress Notes (Signed)
Physical Therapy Note  Patient Details  Name: FLOYD WADE MRN: [id number] Date of Birth: 1951/03/13 Today's Date: 03/30/2012  13:00-14:00 group therapy pt denied pain.  Pt participated in walking group focusing on stepping forward and backwards over stick with LOB while stepping backwards using stepping strategy for correcting LOB, walking without walker, dynamic balance to hit ball with bat. Min assist    Julian Reil 03/30/2012, 3:43 PM

## 2012-03-30 NOTE — Progress Notes (Signed)
Patient ID: Edward [name], male   DOB: 1950-11-01, 61 y.o.   MRN: [id number] Patient ID: Edward [name], male   DOB: [birth year], 61 y.o.   MRN: [id number]  Subjective/Complaints: 8/31.  No problems yesterday. Slept well. A 12 point review of systems has been performed and if not noted above is otherwise negative.  BP Readings from Last 3 Encounters:  03/30/12 126/68  03/15/12 114/77  03/15/12 114/77   Examination-comfortable no distress ENT negative. Chest clear. Cardiovascular normal heart sounds no murmurs or ectopics. Abdomen soft flat nontender no distention. Extremities no edema.  Objective: Vital Signs: Blood pressure 126/68, pulse 83, temperature 98.4 F (36.9 C), temperature source Oral, resp. rate 18, weight 90.311 kg (199 lb 1.6 oz), SpO2 98.00%. No results found. No results found for this or any previous visit (from the past 72 hour(s)).    General: Alert and oriented x 3, No apparent distress HEENT: Head is normocephalic, atraumatic, PERRLA, EOMI, sclera anicteric, oral mucosa pink and moist, dentition intact, ext ear canals clear, nose dressed, sutures in place Neck: Supple without JVD or lymphadenopathy Heart: Reg rate and rhythm. No murmurs rubs or gallops Chest: CTA bilaterally without wheezes, rales, or rhonchi; no distress Abdomen: Soft, non-tender, non-distended, bowel sounds positive. Extremities: No clubbing, cyanosis, or edema. Pulses are 2+ Skin: Clean and intact without signs of breakdown Neuro: Pt is cognitively appropriate with normal insight, memory, and awareness. Cranial nerves 2-12 are intact. Sensory exam is 1/2 in hands and feet.  Biceps and deltoids are 3 to 3+. Triceps 2-3, WE and WF are 1-2.HI are 2-3/5, right hand is stronger.  Lower ext are 4- to 4/5.  Musculoskeletal: Full ROM, No pain with AROM or PROM in the neck, trunk, or extremities. Posture appropriate. No LE edema.  Feet warm to touch.  Bilateral hands with minimal edema.  Psych:  Pt's affect is appropriate. Pt is cooperative     Assessment/Plan: 1. Functional deficits secondary to Central cord syndrome after fall s/p ACDF 4-5 which require 3+ hours per day of interdisciplinary therapy in a comprehensive inpatient rehab setting. Physiatrist is providing close team supervision and 24 hour management of active medical problems listed below. Physiatrist and rehab team continue to assess barriers to discharge/monitor patient progress toward functional and medical goals.   he may only remove collar when supine in bed. We cannot risk another incident like yesterday.  Knee cage per [employer] was delivered yesterday and patient was pleased with fit and functionality with brace.  FIM: FIM - Bathing Bathing Steps Patient Completed: Chest;Right Arm;Left Arm;Abdomen;Front perineal area;Buttocks;Right upper leg;Left upper leg;Left lower leg (including foot);Right lower leg (including foot) Bathing: 5: Supervision: Safety issues/verbal cues  FIM - Upper Body Dressing/Undressing Upper body dressing/undressing steps patient completed: Thread/unthread right sleeve of pullover shirt/dresss;Thread/unthread left sleeve of pullover shirt/dress;Put head through opening of pull over shirt/dress;Pull shirt over trunk Upper body dressing/undressing: 5: Set-up assist to: Apply TLSO, cervical collar FIM - Lower Body Dressing/Undressing Lower body dressing/undressing steps patient completed: Thread/unthread right pants leg;Thread/unthread left pants leg;Pull pants up/down;Don/Doff right sock;Don/Doff left sock;Don/Doff right shoe;Don/Doff left shoe Lower body dressing/undressing: 4: Min-Patient completed 75 plus % of tasks  FIM - Toileting Toileting steps completed by patient: Adjust clothing prior to toileting;Performs perineal hygiene;Adjust clothing after toileting Toileting Assistive Devices: Grab bar or rail for support Toileting: 5: Supervision: Safety issues/verbal  cues  FIM - Diplomatic Services operational officer Devices: Grab bars Toilet Transfers: 4-To toilet/BSC: Min A (steadying Pt. >  75%);4-From toilet/BSC: Min A (steadying Pt. > 75%)  FIM - Bed/Chair Transfer Bed/Chair Transfer Assistive Devices: Therapist, occupational: 6: Supine > Sit: No assist;5: Bed > Chair or W/C: Supervision (verbal cues/safety issues);5: Chair or W/C > Bed: Supervision (verbal cues/safety issues)  FIM - Locomotion: Wheelchair Distance: 20 Locomotion: Wheelchair: 0: Activity did not occur FIM - Locomotion: Ambulation Locomotion: Ambulation Assistive Devices: Walker - Rolling (Rt. knee cage) Ambulation/Gait Assistance: 4: Min guard Locomotion: Ambulation: 4: Travels 150 ft or more with minimal assistance (Pt.>75%)  Comprehension Comprehension Mode: Auditory Comprehension: 7-Follows complex conversation/direction: With no assist  Expression Expression Mode: Verbal Expression: 7-Expresses complex ideas: With no assist  Social Interaction Social Interaction: 7-Interacts appropriately with others - No medications needed.  Problem Solving Problem Solving: 7-Solves complex problems: Recognizes & self-corrects  Memory Memory: 7-Complete Independence: No helper   Medical Problem List and Plan:  1. DVT Prophylaxis/Anticoagulation: Mechanical: Sequential compression devices, below knee Bilateral lower extremities, movibility 2. Pain Management: neurontin 200 tid helping dysesthesias 3. Mood: motivated. Monitor for now.  4. Neuropsych: This patient is capable of making decisions on his/her own behalf.  5. HTN: BP has been variable. Monitor with bid checks. Resumed BB and Norvasc -bp under reasonable control. Started prinivil-will titrate up to 10mg   6. CAD: monitor for symptoms. Resume low dose toprol and zocor.  8. Constipation: on Senna S and miralax bid.   LOS (Days) 15 A FACE TO FACE EVALUATION WAS PERFORMED  Rogelia Boga 03/30/2012,  8:39 AM

## 2012-03-31 ENCOUNTER — Inpatient Hospital Stay (HOSPITAL_COMMUNITY): Payer: Federal, State, Local not specified - PPO | Admitting: Physical Therapy

## 2012-03-31 NOTE — Progress Notes (Signed)
Occupational Therapy Note  Patient Details  Name: Edward [name] MRN: [id number] Date of Birth: 05-15-1951 Today's Date: 03/31/2012  Group Session Time:  1300-1400  (60 min) Pain:  None  Engaged in therapeutic activity group with focus on functional mobility in simple snack prep to address balance, fine motor coordination and strength.  Pt.  Used mouth to tear open packages and spread the peanut butter on the cracker with comments that is was challenging.  TRansferred to tub using the carex tub seat.  Pt reported he would like to get one of these for home use.  Ambulated to gym and rode nustep for 15 minutes at 6 workload.    Humberto Seals 03/31/2012, 6:23 PM

## 2012-03-31 NOTE — Progress Notes (Signed)
Patient ID: Edward [name], male   DOB: 1951/01/12, 61 y.o.   MRN: [id number] Patient ID: Edward [name], male   DOB: Mar 23, 1951, 61 y.o.   MRN: [id number] Patient ID: Edward [name], male   DOB: [birth year], 61 y.o.   MRN: [id number]  Subjective/Complaints: 9/1.  No problems yesterday. Slept well.  Remains asymptomatic  A 12 point review of systems has been performed and if not noted above is otherwise negative.  BP Readings from Last 3 Encounters:  03/31/12 129/88  03/15/12 114/77  03/15/12 114/77   Examination-comfortable no distress ENT negative. Chest clear. Cardiovascular normal heart sounds no murmurs or ectopics. Abdomen soft flat nontender no distention. Extremities no edema.  Objective: Vital Signs: Blood pressure 129/88, pulse 95, temperature 98 F (36.7 C), temperature source Oral, resp. rate 19, weight 90.311 kg (199 lb 1.6 oz), SpO2 100.00%. No results found. No results found for this or any previous visit (from the past 72 hour(s)).    General: Alert and oriented x 3, No apparent distress HEENT: Head is normocephalic, atraumatic, PERRLA, EOMI, sclera anicteric, oral mucosa pink and moist, dentition intact, ext ear canals clear, nose dressed, sutures in place Neck: Supple without JVD or lymphadenopathy Heart: Reg rate and rhythm. No murmurs rubs or gallops Chest: CTA bilaterally without wheezes, rales, or rhonchi; no distress Abdomen: Soft, non-tender, non-distended, bowel sounds positive. Extremities: No clubbing, cyanosis, or edema. Pulses are 2+ Skin: Clean and intact without signs of breakdown Neuro: Pt is cognitively appropriate with normal insight, memory, and awareness. Cranial nerves 2-12 are intact. Sensory exam is 1/2 in hands and feet.  Biceps and deltoids are 3 to 3+. Triceps 2-3, WE and WF are 1-2.HI are 2-3/5, right hand is stronger.  Lower ext are 4- to 4/5.  Musculoskeletal: Full ROM, No pain with AROM or PROM in the neck, trunk, or extremities.  Posture appropriate. No LE edema.  Feet warm to touch.  Bilateral hands with minimal edema.  Psych: Pt's affect is appropriate. Pt is cooperative     Assessment/Plan: 1. Functional deficits secondary to Central cord syndrome after fall s/p ACDF 4-5 which require 3+ hours per day of interdisciplinary therapy in a comprehensive inpatient rehab setting. Physiatrist is providing close team supervision and 24 hour management of active medical problems listed below. Physiatrist and rehab team continue to assess barriers to discharge/monitor patient progress toward functional and medical goals.   he may only remove collar when supine in bed. We cannot risk another incident like yesterday.  Knee cage per [employer] was delivered yesterday and patient was pleased with fit and functionality with brace.  FIM: FIM - Bathing Bathing Steps Patient Completed: Chest;Right Arm;Left Arm;Abdomen;Front perineal area;Buttocks;Right upper leg;Left upper leg;Left lower leg (including foot);Right lower leg (including foot) Bathing: 5: Supervision: Safety issues/verbal cues  FIM - Upper Body Dressing/Undressing Upper body dressing/undressing steps patient completed: Thread/unthread right sleeve of pullover shirt/dresss;Thread/unthread left sleeve of pullover shirt/dress;Put head through opening of pull over shirt/dress;Pull shirt over trunk Upper body dressing/undressing: 5: Set-up assist to: Apply TLSO, cervical collar FIM - Lower Body Dressing/Undressing Lower body dressing/undressing steps patient completed: Thread/unthread right pants leg;Thread/unthread left pants leg;Pull pants up/down;Don/Doff right sock;Don/Doff left sock;Don/Doff right shoe;Don/Doff left shoe Lower body dressing/undressing: 4: Min-Patient completed 75 plus % of tasks  FIM - Toileting Toileting steps completed by patient: Adjust clothing prior to toileting;Performs perineal hygiene;Adjust clothing after toileting Toileting  Assistive Devices: Grab bar or rail for support Toileting: 5: Supervision: Safety issues/verbal cues  FIM - Diplomatic Services operational officer Devices: Grab bars Toilet Transfers: 4-To toilet/BSC: Min A (steadying Pt. > 75%);4-From toilet/BSC: Min A (steadying Pt. > 75%)  FIM - Bed/Chair Transfer Bed/Chair Transfer Assistive Devices: Therapist, occupational: 4: Bed > Chair or W/C: Min A (steadying Pt. > 75%);4: Chair or W/C > Bed: Min A (steadying Pt. > 75%)  FIM - Locomotion: Wheelchair Distance: 20 Locomotion: Wheelchair: 0: Activity did not occur FIM - Locomotion: Ambulation Locomotion: Ambulation Assistive Devices: Walker - Rolling (Rt. knee cage) Ambulation/Gait Assistance: 4: Min guard Locomotion: Ambulation: 4: Travels 150 ft or more with minimal assistance (Pt.>75%)  Comprehension Comprehension Mode: Auditory Comprehension: 7-Follows complex conversation/direction: With no assist  Expression Expression Mode: Verbal Expression: 7-Expresses complex ideas: With no assist  Social Interaction Social Interaction: 6-Interacts appropriately with others with medication or extra time (anti-anxiety, antidepressant).  Problem Solving Problem Solving: 5-Solves complex 90% of the time/cues < 10% of the time  Memory Memory: 5-Recognizes or recalls 90% of the time/requires cueing < 10% of the time   Medical Problem List and Plan:  1. DVT Prophylaxis/Anticoagulation: Mechanical: Sequential compression devices, below knee Bilateral lower extremities, movibility 2. Pain Management: neurontin 200 tid helping dysesthesias 3. Mood: motivated. Monitor for now.  4. Neuropsych: This patient is capable of making decisions on his/her own behalf.  5. HTN: BP has been variable. Monitor with bid checks. Resumed BB and Norvasc -bp under reasonable control. Started prinivil-will titrate up to 10mg   6. CAD: monitor for symptoms. Resume low dose toprol and zocor.  8. Constipation: on  Senna S and miralax bid.   LOS (Days) 16 A FACE TO FACE EVALUATION WAS PERFORMED  Rogelia Boga 03/31/2012, 8:27 AM

## 2012-04-01 ENCOUNTER — Inpatient Hospital Stay (HOSPITAL_COMMUNITY): Payer: Federal, State, Local not specified - PPO | Admitting: Physical Therapy

## 2012-04-01 ENCOUNTER — Inpatient Hospital Stay (HOSPITAL_COMMUNITY): Payer: Federal, State, Local not specified - PPO

## 2012-04-01 DIAGNOSIS — Z5189 Encounter for other specified aftercare: Secondary | ICD-10-CM

## 2012-04-01 DIAGNOSIS — G8250 Quadriplegia, unspecified: Secondary | ICD-10-CM

## 2012-04-01 NOTE — Progress Notes (Signed)
Physical Therapy Session Note  Patient Details  Name: Edward [name] MRN: [id number] Date of Birth: July 09, 1951  Today's Date: 04/01/2012 Time: 4098-1191 Time Calculation (min): 44 min  Short Term Goals: Week 3:   = LTGs  Skilled Therapeutic Interventions/Progress Updates:    Gait training x 200' total with RW, supervision. Practiced gait with torso rotation/visual scanning with min assist. Obstacle course with forwards/backwards walking over controlled and complient and compliant/uneven surfaces. Ball toss, weighted ball diagonal patterns with bil. UEs practiced while standing on compliant surface, varied bases of support for increased difficulty to promote balance reactions in addition to bil. UE strengthening.   Pt noted to have very long patellar tendon most evident in supine with knees flexed position. This has likely contributed to impaired ability of his Rt. Quad to contribute to knee control in gait. Pt reports this is a long standing injury.  Therapy Documentation Precautions:  Precautions Precautions: Fall Precaution Comments: s/p ACDF and has weakness in B UE's and  Required Braces or Orthoses: Cervical Brace Cervical Brace: Hard collar Restrictions Weight Bearing Restrictions: No Pain: No c/o pain  See FIM for current functional status  Therapy/Group: Individual Therapy  [name] 04/01/2012, 6:02 PM

## 2012-04-01 NOTE — Progress Notes (Signed)
Physical Therapy Session Note  Patient Details  Name: Edward [name] MRN: [id number] Date of Birth: [birth year]  Today's Date: 04/01/2012 Time: 0830-0928 Time Calculation (min): 58 min  Short Term Goals: Week 2:  PT Short Term Goal 1 (Week 2): Pt will ambulate x 100' with LRAD with min assist PT Short Term Goal 1 - Progress (Week 2): Met PT Short Term Goal 2 (Week 2): Pt will perform sit <> stand with supervision PT Short Term Goal 2 - Progress (Week 2): Met PT Short Term Goal 3 (Week 2): Pt will perform dynamic balance activities without UE support with moderate assist.  PT Short Term Goal 3 - Progress (Week 2): Met  Skilled Therapeutic Interventions/Progress Updates:    Gait training with RW x 200' with supervision, without assistive device 25', 150' with min assist. Car transfer performed with supervision, min verbal cues for sequence. 12 steps in stairwell consecutively with close supervision, one rail. Bil. Hip strengthening: supine bridging with resisted hip abduction (green band) progressing to bridge + marching; Side stepping with green band resistance around knees. NuStep per pt request level 7 x 15 min, steps/min > 50, RPE 13.  Knee cage donned for entire session.   Therapy Documentation Precautions:  Precautions Precautions: Fall Precaution Comments: s/p ACDF and has weakness in B UE's and  Required Braces or Orthoses: Cervical Brace Cervical Brace: Hard collar Restrictions Weight Bearing Restrictions: No Pain: Pain Assessment Pain Assessment: No/denies pain   See FIM for current functional status  Therapy/Group: Individual Therapy  [name] 04/01/2012, 12:32 PM

## 2012-04-01 NOTE — Progress Notes (Signed)
Patient ID: Edward [name], male   DOB: Jul 24, 1951, 61 y.o.   MRN: [id number]  Subjective/Complaints: No problems yesterday. Slept well.Constipated but voiding well A 12 point review of systems has been performed and if not noted above is otherwise negative.   Objective: Vital Signs: Blood pressure 119/82, pulse 95, temperature 98.4 F (36.9 C), temperature source Oral, resp. rate 18, weight 90.311 kg (199 lb 1.6 oz), SpO2 99.00%. No results found. No results found for this or any previous visit (from the past 72 hour(s)).    General: Alert and oriented x 3, No apparent distress HEENT: Head is normocephalic, atraumatic, PERRLA, EOMI, sclera anicteric, oral mucosa pink and moist, dentition intact, ext ear canals clear, nose dressed, sutures in place Neck: Supple without JVD or lymphadenopathy Heart: Reg rate and rhythm. No murmurs rubs or gallops Chest: CTA bilaterally without wheezes, rales, or rhonchi; no distress Abdomen: Soft, non-tender, non-distended, bowel sounds positive. Extremities: No clubbing, cyanosis, or edema. Pulses are 2+ Skin: Clean and intact without signs of breakdown Neuro: Pt is cognitively appropriate with normal insight, memory, and awareness. Cranial nerves 2-12 are intact. Sensory exam is 1/2 in hands and feet.  Biceps and deltoids are 3 to 3+. Triceps 2-3, WE and WF are 1-2.HI are 2-3/5, right hand is stronger.  Lower ext are 4- to 4/5.  Musculoskeletal: Full ROM, No pain with AROM or PROM in the neck, trunk, or extremities. Posture appropriate. No LE edema.  Feet warm to touch.  Bilateral hands with minimal edema.  Psych: Pt's affect is appropriate. Pt is cooperative     Assessment/Plan: 1. Functional deficits secondary to Central cord syndrome after fall s/p ACDF 4-5 which require 3+ hours per day of interdisciplinary therapy in a comprehensive inpatient rehab setting. Physiatrist is providing close team supervision and 24 hour management of active  medical problems listed below. Physiatrist and rehab team continue to assess barriers to discharge/monitor patient progress toward functional and medical goals.   he may only remove collar when supine in bed. We cannot risk another incident like yesterday.  Knee cage per [employer] was delivered yesterday and patient was pleased with fit and functionality with brace.  FIM: FIM - Bathing Bathing Steps Patient Completed: Chest;Right Arm;Left Arm;Abdomen;Front perineal area;Buttocks;Right upper leg;Left upper leg;Left lower leg (including foot);Right lower leg (including foot) Bathing: 5: Supervision: Safety issues/verbal cues  FIM - Upper Body Dressing/Undressing Upper body dressing/undressing steps patient completed: Thread/unthread right sleeve of pullover shirt/dresss;Thread/unthread left sleeve of pullover shirt/dress;Put head through opening of pull over shirt/dress;Pull shirt over trunk Upper body dressing/undressing: 5: Set-up assist to: Apply TLSO, cervical collar FIM - Lower Body Dressing/Undressing Lower body dressing/undressing steps patient completed: Thread/unthread right pants leg;Thread/unthread left pants leg;Pull pants up/down;Don/Doff right sock;Don/Doff left sock;Don/Doff right shoe;Don/Doff left shoe Lower body dressing/undressing: 4: Min-Patient completed 75 plus % of tasks  FIM - Toileting Toileting steps completed by patient: Adjust clothing prior to toileting;Performs perineal hygiene;Adjust clothing after toileting Toileting Assistive Devices: Grab bar or rail for support Toileting: 5: Supervision: Safety issues/verbal cues  FIM - Diplomatic Services operational officer Devices: Grab bars Toilet Transfers: 4-To toilet/BSC: Min A (steadying Pt. > 75%);4-From toilet/BSC: Min A (steadying Pt. > 75%)  FIM - Bed/Chair Transfer Bed/Chair Transfer Assistive Devices: Therapist, occupational: 5: Supine > Sit: Supervision (verbal cues/safety issues);5: Sit >  Supine: Supervision (verbal cues/safety issues);5: Chair or W/C > Bed: Supervision (verbal cues/safety issues);5: Bed > Chair or W/C: Supervision (verbal cues/safety issues)  FIM -  Locomotion: Wheelchair Distance: 20 Locomotion: Wheelchair: 0: Activity did not occur FIM - Locomotion: Ambulation Locomotion: Ambulation Assistive Devices: Walker - Rolling (Rt. knee cage) Ambulation/Gait Assistance: 4: Min guard Locomotion: Ambulation: 4: Travels 150 ft or more with minimal assistance (Pt.>75%)  Comprehension Comprehension Mode: Auditory Comprehension: 7-Follows complex conversation/direction: With no assist  Expression Expression Mode: Verbal Expression: 7-Expresses complex ideas: With no assist  Social Interaction Social Interaction: 6-Interacts appropriately with others with medication or extra time (anti-anxiety, antidepressant).  Problem Solving Problem Solving: 5-Solves complex 90% of the time/cues < 10% of the time  Memory Memory: 5-Recognizes or recalls 90% of the time/requires cueing < 10% of the time   Medical Problem List and Plan:  1. DVT Prophylaxis/Anticoagulation: Mechanical: Sequential compression devices, below knee Bilateral lower extremities, movibility 2. Pain Management: neurontin 200 tid helping dysesthesias 3. Mood: motivated. Monitor for now.  4. Neuropsych: This patient is capable of making decisions on his/her own behalf.  5. HTN: BP has been variable. Monitor with bid checks. Resumed BB and Norvasc -bp under reasonable control. Started prinivil-will titrate up to 10mg   6. CAD: monitor for symptoms. Resume low dose toprol and zocor.  8. Constipation: on Senna S and miralax bid.   LOS (Days) 17 A FACE TO FACE EVALUATION WAS PERFORMED  KIRSTEINS,ANDREW E 04/01/2012, 8:09 AM

## 2012-04-01 NOTE — Progress Notes (Signed)
Occupational Therapy Session Note  Patient Details  Name: Edward [name] MRN: [id number] Date of Birth: [birth year]  Today's Date: 04/01/2012  Session 1 Time: 0700-0755 Time Calculation (min): 55 min  Short Term Goals: Week 3:  OT Short Term Goal 1 (Week 3): Short Term Goals = Long Term Goals  Skilled Therapeutic Interventions/Progress Updates:    Pt engaged in bathing at shower level and dressing with sit<>stand from EOB.  Pt required assistance only with tieing shoes.  Pt continues to exhibit increased dexterity in bilateral hands and is able to open toothpaste and apply toothpaste to toothbrush without assistance.  Focus on increased functional use of bilateral hands in ADLs and safety awareness.  Therapy Documentation Precautions:  Precautions Precautions: Fall Precaution Comments: s/p ACDF and has weakness in B UE's and  Required Braces or Orthoses: Cervical Brace Cervical Brace: Hard collar Restrictions Weight Bearing Restrictions: No Pain: Pain Assessment Pain Assessment: No/denies pain  Therapy/Group: Individual Therapy  Session 2 Time: 1400-1430 Pt denies pain Individual Therapy Pt engaged in home management tasks/kitchen task in [place] apartment with and without use of RW.  Pt retrieved items from floor, drawers, refrigerator, and transported items to other areas of apartment.  Pt exhibited LOB x 2 but was able to self correct.   [name] [facility] 04/01/2012, 7:57 AM

## 2012-04-02 ENCOUNTER — Inpatient Hospital Stay (HOSPITAL_COMMUNITY): Payer: Federal, State, Local not specified - PPO | Admitting: *Deleted

## 2012-04-02 ENCOUNTER — Inpatient Hospital Stay (HOSPITAL_COMMUNITY): Payer: Federal, State, Local not specified - PPO

## 2012-04-02 ENCOUNTER — Inpatient Hospital Stay (HOSPITAL_COMMUNITY): Payer: Federal, State, Local not specified - PPO | Admitting: Physical Therapy

## 2012-04-02 DIAGNOSIS — G8250 Quadriplegia, unspecified: Secondary | ICD-10-CM

## 2012-04-02 DIAGNOSIS — Z5189 Encounter for other specified aftercare: Secondary | ICD-10-CM

## 2012-04-02 NOTE — Progress Notes (Signed)
Occupational Therapy Session Note  Patient Details  Name: Edward [name] MRN: [id number] Date of Birth: [birth year]  Today's Date: 04/02/2012  Session 1 Time: 0700-0754 Time Calculation (min): 54 min  Short Term Goals: Week 2:     Skilled Therapeutic Interventions/Progress Updates:    Pt engaged in bathing tasks at walk-in shower level and dressing with sit<>stand from EOB.  Pt amb with RW to bathroom and back with supervision.  Pt completed all ADLs with supervision.  Pt completes grooming tasks while standing at sink.  Focus on safety awareness, activity tolerance, and functional ambulation with RW to gather supplies and clothing.    Therapy Documentation Precautions:  Precautions Precautions: Fall Precaution Comments: s/p ACDF and has weakness in B UE's and  Required Braces or Orthoses: Cervical Brace Cervical Brace: Hard collar Restrictions Weight Bearing Restrictions: No Pain: Pain Assessment Pain Assessment: No/denies pain   See FIM for current functional status  Therapy/Group: Individual Therapy  Session 2 Time: 1300-1330 Pt c/o 7/10 pain in bilateral hands; RN notified and meds administered Individual Therapy [name] present to complete family education including doffing/donning cervical collar, walk-in shower transfers, and home safety recommendations.  Recommendation for 24 hour supervision.  Wife and pt verbalized understanding of recommendations and wife demonstrated independence with doffing/donning and changing pads for cervical collar.  [name] 04/02/2012, 7:54 AM

## 2012-04-02 NOTE — Patient Care Conference (Signed)
Inpatient RehabilitationTeam Conference Note Date: 04/02/2012   Time: 2:30 PM    Patient Name: Edward [name]      Medical Record Number: [id number]  Date of Birth: 1951/06/22 Sex: Male         Room/Bed: 4145/4145-01 Payor Info: Payor: BLUE [employer] BLUE [employer]IELD  Plan: [employer] PPO  Product Type: *No Product type*     Admitting Diagnosis: CENTRAL CORD SYNDROME  Admit Date/Time:  03/15/2012  6:46 PM Admission Comments: No comment available   Primary Diagnosis:  SCI (spinal cord injury) Principal Problem: SCI (spinal cord injury)  Patient Active Problem List   Diagnosis Date Noted  . Cervical myelopathy 03/18/2012  . SCI (spinal cord injury) 03/18/2012  . HTN (hypertension) 03/18/2012  . Nasal bone fracture 03/11/2012  . CAD (coronary artery disease) 03/11/2012    Expected Discharge Date: Expected Discharge Date: 04/03/12  Team Members Present: Physician: Dr. Faith Rogue Social Worker Present: Dossie Der, LCSW Nurse Present: Other (comment) Charisse March Hicks-RN) PT Present: Karolee Stamps, PT OT Present: Edwin Cap, OT SLP Present: Feliberto Gottron, SLP Other (Discipline and Name): Charolette Child Coordinator     Current Status/Progress Goal Weekly Team Focus  Medical   improving neuro fxn  see prior  finalize dc planning   Bowel/Bladder   continent of bowel abd bladder         Swallow/Nutrition/ Hydration             ADL's   overall supervision  supervision/mod I overall  family ed; safety awareness   Mobility   supervision (min assist for high level challenges)  supervision  Family education, safety with mobility, balance.    Communication             Safety/Cognition/ Behavioral Observations  no unsafe behaviors         Pain   oxy 10mg  PRN q3 hrs.  3 or less  assess pain q3 hrs and treat PRN   Skin   incision to anterior neck with dermabond  no new skin breakdown  monitor skin q shift      *See Interdisciplinary Assessment and Plan and progress  notes for long and short-term goals  Barriers to Discharge: none presently    Possible Resolutions to Barriers:  none     Discharge Planning/Teaching [name]:  Wife completed family education, pt ready fro discharge tomorrow.  OP arranged and DME delivered      Team Discussion:  Pt made much improvement, braces working well, pain controlled-medically ready for discharge tomorrow  Revisions to Treatment Plan:  NOne   Continued Need for Acute Rehabilitation Level of Care: The patient requires daily medical management by a physician with specialized training in physical medicine and rehabilitation for the following conditions: Daily direction of a multidisciplinary physical rehabilitation program to ensure safe treatment while eliciting the highest outcome that is of practical value to the patient.: Yes Daily medical management of patient stability for increased activity during participation in an intensive rehabilitation regime.: Yes  Edward [name], Edward [name] 04/02/2012, 2:30 PM

## 2012-04-02 NOTE — Progress Notes (Signed)
Physical Therapy Session Note  Patient Details  Name: Edward [name] MRN: [id number] Date of Birth: 1951/02/16  Today's Date: 04/02/2012 Time: 0830-0930 Time Calculation (min): 60 min  Short Term Goals: Week 3:   =LTGs  Skilled Therapeutic Interventions/Progress Updates:    Controlled environment ambulation x >200' with RW, supervision. Home environment ambulation (including side stepping/backwards walking) over carpet with RW x 80' performed with supervision/modified independent. Flight of steps (12 steps) performed with supervision. Floor transfer at modified independence level, pt able to verbalize safety with transfer and when to call for assist or call ambulance. Dynamic balance activity with ball toss against trampoline varying bases of support on and off compliant surface, manual perturbations added for increased difficulty. NuStep x 15 min level 7 progressing to level 8 for strength and conditioning. RPE at 15.   Therapy Documentation Precautions:  Precautions Precautions: Fall Precaution Comments: s/p ACDF and has weakness in B UE's and  Required Braces or Orthoses: Cervical Brace Cervical Brace: Hard collar Restrictions Weight Bearing Restrictions: No Pain: Pain Assessment Pain Assessment: No/denies pain   See FIM for current functional status  Therapy/Group: Individual Therapy  [name] 04/02/2012, 12:17 PM

## 2012-04-02 NOTE — Progress Notes (Signed)
Social Work Patient ID: Edward [name], male   DOB: Jan 22, 1951, 61 y.o.   MRN: [id number] Met with pt to discuss readiness for discharge tomorrow.  He reports he is pleased with his progress and wife has been here to Learn his care.  He will go to OP therapies at discharge and DME coming to room today.  See wife when here to address any other  Questions or concerns.  [name] pt MD letter regarding return to work.

## 2012-04-02 NOTE — Progress Notes (Signed)
Occupational Therapy Discharge Summary  Patient Details  Name: Edward [name] MRN: [id number] Date of Birth: 02/26/1951  Today's Date: 04/02/2012  Patient has met 11 of 11 long term goals due to improved activity tolerance, improved balance, postural control, ability to compensate for deficits, functional use of  RIGHT upper and LEFT upper extremity, improved attention, improved awareness and improved coordination.  Pt made excellent gains in bathing, dressing, toilet and shower transfers, and toileting as well as increased dexterity with bilateral hands .  Pt is supervision for all BADLs. Wife has been present for family education.Patient to discharge at overall Supervision level.  Patient's care partner is independent to provide the necessary supervision prn assistance at discharge.    Recommendation:  Patient will benefit from ongoing skilled OT services in outpatient setting to continue to advance functional skills in the area of BADL, iADL and Reduce care partner burden.  Equipment: Shower seat  Reasons for discharge: treatment goals met and discharge from hospital  Patient/family agrees with progress made and goals achieved: Yes  Pain Pain Assessment Pain Score:   2 Pain Type: Neuropathic pain Pain Location: Hand Pain Orientation: Right;Left Pain Descriptors: Pins and needles Pain Intervention(s): Medication (See eMAR)  ADL ADL Equipment Provided: Feeding equipment (bath mitt; universal cuff; cup with handle) Eating: Modified independent Where Assessed-Eating: Edge of bed Grooming: Modified independent Where Assessed-Grooming: Standing at sink Upper Body Bathing: Modified independent Where Assessed-Upper Body Bathing: Shower Lower Body Bathing: Supervision/safety Where Assessed-Lower Body Bathing: Shower Upper Body Dressing: Supervision/safety Where Assessed-Upper Body Dressing: Edge of bed Lower Body Dressing: Supervision/safety Where Assessed-Lower Body Dressing:  Edge of bed Where Assessed-Toileting: Teacher, adult education: Distant supervision Statistician Method: Proofreader: Raised Copy: Close supervision Film/video editor Method: Designer, industrial/product: Information systems manager with back  Cognition Overall Cognitive Status: Appears within functional limits for tasks assessed Arousal/Alertness: Awake/alert Orientation Level: Oriented X4 Attention: Alternating Alternating Attention: Appears intact Memory: Appears intact Awareness: Appears intact Problem Solving: Appears intact Executive Function: Reasoning;Sequencing;Organizing;Decision Making;Initiating;Self Monitoring;Self Correcting Reasoning: Appears intact Sequencing: Appears intact Organizing: Appears intact Decision Making: Appears intact Initiating: Appears intact Self Monitoring: Appears intact Self Correcting: Appears intact Safety/Judgment: Appears intact  Sensation Sensation Light Touch: Impaired by gross assessment Stereognosis: Impaired by gross assessment Hot/Cold: Appears Intact Proprioception: Appears Intact Coordination Gross Motor Movements are Fluid and Coordinated: No Fine Motor Movements are Fluid and Coordinated: No Coordination and Movement Description: impaired dexterity, imparired grasp/release  Motor - See Discharge Summary  Mobility - See Discharge Summary  Trunk/Postural Assessment  Cervical Assessment Cervical Assessment: Exceptions to [facility] (cervical collar) Thoracic Assessment Thoracic Assessment: Within Functional Limits Lumbar Assessment Lumbar Assessment: Within Functional Limits   Balance Static Sitting Balance Static Sitting - Balance Support: Feet supported Static Sitting - Level of Assistance: 7: Independent Dynamic Sitting Balance Dynamic Sitting - Balance Support: Feet supported Dynamic Sitting - Level of Assistance: 7: Independent  Extremity/Trunk Assessment RUE  Strength Right Wrist Flexion: 3+/5 Right Wrist Extension: 3+/5 Right Wrist Radial Deviation: 3+/5 Right Wrist Ulnar Deviation: 3+/5 LUE Strength Left Wrist Flexion: 3+/5 Left Wrist Extension: 3+/5 Left Wrist Radial Deviation: 3+/5 Left Wrist Ulnar Deviation: 3+/5  See FIM for current functional status  Rich Brave 04/02/2012, 3:25 PM

## 2012-04-02 NOTE — Progress Notes (Signed)
Recreational Therapy Discharge Summary Patient Details  Name: Edward [name] MRN: [id number] Date of Birth: 1951-03-12 Today's Date: 04/02/2012  Long term goals set: 1  Long term goals met: 1  Comments on progress toward goals: Pt has made excellent progress toward goal and is ready for discharge home tomorrow at overall supervision level.  [name] has been present, observing, and participatory in therapies.  Reasons for discharge: discharge from hospital   Patient/family agrees with progress made and goals achieved: Yes  [name] 04/02/2012, 4:53 PM

## 2012-04-02 NOTE — Progress Notes (Signed)
Physical Therapy Session Note  Patient Details  Name: Edward [name] MRN: [id number] Date of Birth: 24-Aug-1950  Today's Date: 04/02/2012 Time: 1445-1530 Time Calculation (min): 45 min  Skilled Therapeutic Interventions/Progress Updates:    Family education completed with pt and pt's wife for safety with gait, using RW, floor transfer/what to do in case of emergency and stair negotiation; return demo successfully. Wife reports they have already made modifications to the home and are prepared for d/c tomorrow. Rest of treatment with focus on dynamic balance and gait using basketball to shoot, bounce, pass, catch, and dribble basketball with close supervision. Excellent participation. Gait to/from therapy with RW for endurance.  Therapy Documentation Precautions:  Precautions Precautions: Fall Precaution Comments: s/p ACDF and has weakness in B UE's and  Required Braces or Orthoses: Cervical Brace Cervical Brace: Hard collar Restrictions Weight Bearing Restrictions: No  Pain:   Denies pain.  See FIM for current functional status  Therapy/Group: Individual Therapy and Co-Treatment with Recreational Therapy  Karolee Stamps [facility] 04/02/2012, 4:32 PM

## 2012-04-02 NOTE — Progress Notes (Signed)
Patient ID: Edward [name], male   DOB: August 18, 1950, 61 y.o.   MRN: [id number]  Subjective/Complaints: No problems yesterday. Excited to go home A 12 point review of systems has been performed and if not noted above is otherwise negative.   Objective: Vital Signs: Blood pressure 129/82, pulse 62, temperature 97.9 F (36.6 C), temperature source Oral, resp. rate 17, weight 90.311 kg (199 lb 1.6 oz), SpO2 99.00%. No results found. No results found for this or any previous visit (from the past 72 hour(s)).    General: Alert and oriented x 3, No apparent distress HEENT: Head is normocephalic, atraumatic, PERRLA, EOMI, sclera anicteric, oral mucosa pink and moist, dentition intact, ext ear canals clear, nose dressed, sutures in place Neck: Supple without JVD or lymphadenopathy Heart: Reg rate and rhythm. No murmurs rubs or gallops Chest: CTA bilaterally without wheezes, rales, or rhonchi; no distress Abdomen: Soft, non-tender, non-distended, bowel sounds positive. Extremities: No clubbing, cyanosis, or edema. Pulses are 2+ Skin: Clean and intact without signs of breakdown Neuro: Pt is cognitively appropriate with normal insight, memory, and awareness. Cranial nerves 2-12 are intact. Sensory exam is 1/2 in hands and feet.  Biceps and deltoids are 3 to 3+. Triceps 2-3, WE and WF are 1-2.HI are 3/5, right hand is stronger.  Lower ext are 4- to 4/5.  Musculoskeletal: Full ROM, No pain with AROM or PROM in the neck, trunk, or extremities. Posture appropriate. No LE edema.  Feet warm to touch.  Bilateral hands with minimal edema.  Psych: Pt's affect is appropriate. Pt is cooperative     Assessment/Plan: 1. Functional deficits secondary to Central cord syndrome after fall s/p ACDF 4-5 which require 3+ hours per day of interdisciplinary therapy in a comprehensive inpatient rehab setting. Physiatrist is providing close team supervision and 24 hour management of active medical problems listed  below. Physiatrist and rehab team continue to assess barriers to discharge/monitor patient progress toward functional and medical goals.   he may only remove collar when supine in bed. We cannot risk another incident like yesterday.  Finalize dc planning. He needs a note for work as well.  FIM: FIM - Bathing Bathing Steps Patient Completed: Chest;Right Arm;Left Arm;Abdomen;Front perineal area;Buttocks;Right upper leg;Left upper leg;Right lower leg (including foot);Left lower leg (including foot) Bathing: 5: Supervision: Safety issues/verbal cues  FIM - Upper Body Dressing/Undressing Upper body dressing/undressing steps patient completed: Thread/unthread left sleeve of pullover shirt/dress;Put head through opening of pull over shirt/dress;Pull shirt over trunk;Thread/unthread right sleeve of pullover shirt/dresss Upper body dressing/undressing: 5: Set-up assist to: Apply TLSO, cervical collar FIM - Lower Body Dressing/Undressing Lower body dressing/undressing steps patient completed: Thread/unthread left pants leg;Pull pants up/down;Fasten/unfasten pants;Don/Doff right sock;Don/Doff left sock;Don/Doff right shoe;Don/Doff left shoe;Fasten/unfasten left shoe;Fasten/unfasten right shoe Lower body dressing/undressing: 5: Supervision: Safety issues/verbal cues  FIM - Toileting Toileting steps completed by patient: Adjust clothing prior to toileting;Performs perineal hygiene;Adjust clothing after toileting Toileting Assistive Devices: Grab bar or rail for support Toileting: 5: Supervision: Safety issues/verbal cues  FIM - Diplomatic Services operational officer Devices: Elevated toilet seat Toilet Transfers: 5-To toilet/BSC: Supervision (verbal cues/safety issues);5-From toilet/BSC: Supervision (verbal cues/safety issues)  FIM - Banker Devices: Therapist, occupational: 6: Supine > Sit: No assist;6: Sit > Supine: No assist;5: Bed > Chair or W/C:  Supervision (verbal cues/safety issues);5: Chair or W/C > Bed: Supervision (verbal cues/safety issues)  FIM - Locomotion: Wheelchair Distance: 20 Locomotion: Wheelchair: 0: Activity did not occur FIM - Locomotion: Ambulation Locomotion:  Ambulation Assistive Devices: Walker - Rolling Ambulation/Gait Assistance: 5: Supervision Locomotion: Ambulation: 5: Travels 150 ft or more with supervision/safety issues  Comprehension Comprehension Mode: Auditory Comprehension: 6-Follows complex conversation/direction: With extra time/assistive device  Expression Expression Mode: Verbal Expression: 6-Expresses complex ideas: With extra time/assistive device  Social Interaction Social Interaction: 6-Interacts appropriately with others with medication or extra time (anti-anxiety, antidepressant).  Problem Solving Problem Solving: 6-Solves complex problems: With extra time  Memory Memory: 6-More than reasonable amt of time   Medical Problem List and Plan:  1. DVT Prophylaxis/Anticoagulation: Mechanical: Sequential compression devices, below knee Bilateral lower extremities, movibility 2. Pain Management: neurontin 200 tid helping dysesthesias 3. Mood: motivated. Monitor for now.  4. Neuropsych: This patient is capable of making decisions on his/her own behalf.  5. HTN: BP has been variable. Monitor with bid checks. Resumed BB and Norvasc -bp under reasonable control. Started prinivil-will titrate up to 10mg   6. CAD: monitor for symptoms. Resume low dose toprol and zocor.  8. Constipation: on Senna S and miralax bid.   LOS (Days) 18 A FACE TO FACE EVALUATION WAS PERFORMED  Vi Whitesel T 04/02/2012, 8:19 AM

## 2012-04-02 NOTE — Progress Notes (Signed)
Social Work Patient ID: Edward [name], male   DOB: 04-29-1951, 61 y.o.   MRN: [id number] Met with pt and wife to address any last minute questions or concerns.  Wife here to complete family education.   Both feel comfortable with discharge tomorrow.  Received DME and follow up therapies.  Set for tomorrow.

## 2012-04-02 NOTE — Progress Notes (Signed)
Recreational Therapy Session Note  Patient Details  Name: JUWUAN SEDITA MRN: [id number] Date of Birth: 1951-05-28 Today's Date: 04/02/2012 Time:  1445-1530 Pain: no c/o Skilled Therapeutic Interventions/Progress Updates: Family education completed with pt and pt's wife for safety with gait, using RW, floor transfer/what to do in case of emergency and stair negotiation; return demo successfully. Wife reports they have already made modifications to the home and are prepared for d/c tomorrow.   Pt doubled with another pt focusing on dynamic standing balance and ambulation.  Pt stood to shoot, bounce, pass, catch, and dribble basketball with close supervision. Excellent participation. Ambulated to/from therapy with RW for endurance.   Therapy/Group: Co-Treatment   Bernadene Garside 04/02/2012, 4:42 PM

## 2012-04-03 ENCOUNTER — Encounter (HOSPITAL_COMMUNITY): Payer: Federal, State, Local not specified - PPO

## 2012-04-03 MED ORDER — LISINOPRIL 10 MG PO TABS: 10.0000 mg | ORAL_TABLET | Freq: Every day | ORAL | Status: DC

## 2012-04-03 MED ORDER — SENNOSIDES-DOCUSATE SODIUM 8.6-50 MG PO TABS: 2.0000 | ORAL_TABLET | Freq: Two times a day (BID) | ORAL | Status: DC

## 2012-04-03 MED ORDER — GABAPENTIN 100 MG PO CAPS: 200.0000 mg | ORAL_CAPSULE | Freq: Three times a day (TID) | ORAL | Status: DC

## 2012-04-03 MED ORDER — OXYCODONE HCL 5 MG PO TABS: 5.0000 mg | ORAL_TABLET | Freq: Four times a day (QID) | ORAL | Status: AC | PRN

## 2012-04-03 MED ORDER — POLYETHYLENE GLYCOL 3350 17 G PO PACK: 17.0000 g | PACK | Freq: Two times a day (BID) | ORAL | Status: AC

## 2012-04-03 NOTE — Progress Notes (Signed)
Pt discharged to home with wife.  Pt expressed understanding of discharge instructions.  NT took pt to exit in wheelchair.

## 2012-04-03 NOTE — Progress Notes (Signed)
Rx for oxycodone 5mg  #100 given. Discharge summary [phone]

## 2012-04-03 NOTE — Progress Notes (Signed)
Patient ID: Edward [name], male   DOB: [birth year], 61 y.o.   MRN: [id number]  Subjective/Complaints: No problems yesterday. Excited to go home A 12 point review of systems has been performed and if not noted above is otherwise negative.   Objective: Vital Signs: Blood pressure 123/77, pulse 66, temperature 97.7 F (36.5 C), temperature source Oral, resp. rate 18, weight 90.311 kg (199 lb 1.6 oz), SpO2 99.00%. No results found. No results found for this or any previous visit (from the past 72 hour(s)).    General: Alert and oriented x 3, No apparent distress HEENT: Head is normocephalic, atraumatic, PERRLA, EOMI, sclera anicteric, oral mucosa pink and moist, dentition intact, ext ear canals clear, nose dressed, sutures in place Neck: Supple without JVD or lymphadenopathy Heart: Reg rate and rhythm. No murmurs rubs or gallops Chest: CTA bilaterally without wheezes, rales, or rhonchi; no distress Abdomen: Soft, non-tender, non-distended, bowel sounds positive. Extremities: No clubbing, cyanosis, or edema. Pulses are 2+ Skin: Clean and intact without signs of breakdown Neuro: Pt is cognitively appropriate with normal insight, memory, and awareness. Cranial nerves 2-12 are intact. Sensory exam is 1/2 in hands and feet.  Biceps and deltoids are 3 to 3+. Triceps 2-3, WE and WF are 1-2.HI are 3/5, right hand is stronger.  Lower ext are 4- to 4/5.  Musculoskeletal: Full ROM, No pain with AROM or PROM in the neck, trunk, or extremities. Posture appropriate. No LE edema.  Feet warm to touch.  Bilateral hands with minimal edema.  Psych: Pt's affect is appropriate. Pt is cooperative     Assessment/Plan: 1. Functional deficits secondary to Central cord syndrome after fall s/p ACDF 4-5 which require 3+ hours per day of interdisciplinary therapy in a comprehensive inpatient rehab setting. Physiatrist is providing close team supervision and 24 hour management of active medical problems listed  below. Physiatrist and rehab team continue to assess barriers to discharge/monitor patient progress toward functional and medical goals.   he may only remove collar when supine in bed. We cannot risk another incident like yesterday.  Home today. outpt follow up. i'll see hiim in about a month.  FIM: FIM - Bathing Bathing Steps Patient Completed: Chest;Right Arm;Left Arm;Abdomen;Front perineal area;Buttocks;Right upper leg;Left upper leg;Right lower leg (including foot);Left lower leg (including foot) Bathing: 5: Supervision: Safety issues/verbal cues  FIM - Upper Body Dressing/Undressing Upper body dressing/undressing steps patient completed: Thread/unthread left sleeve of pullover shirt/dress;Put head through opening of pull over shirt/dress;Pull shirt over trunk;Thread/unthread right sleeve of pullover shirt/dresss Upper body dressing/undressing: 5: Set-up assist to: Apply TLSO, cervical collar FIM - Lower Body Dressing/Undressing Lower body dressing/undressing steps patient completed: Thread/unthread left pants leg;Pull pants up/down;Fasten/unfasten pants;Don/Doff right sock;Don/Doff left sock;Don/Doff right shoe;Don/Doff left shoe;Fasten/unfasten left shoe;Fasten/unfasten right shoe Lower body dressing/undressing: 5: Supervision: Safety issues/verbal cues  FIM - Toileting Toileting steps completed by patient: Adjust clothing prior to toileting;Performs perineal hygiene;Adjust clothing after toileting Toileting Assistive Devices: Grab bar or rail for support Toileting: 6: More than reasonable amount of time  FIM - Diplomatic Services operational officer Devices: Elevated toilet seat Toilet Transfers: 5-To toilet/BSC: Supervision (verbal cues/safety issues);5-From toilet/BSC: Supervision (verbal cues/safety issues)  FIM - Banker Devices: Therapist, occupational: 6: Supine > Sit: No assist;6: Sit > Supine: No assist;5: Bed > Chair or W/C:  Supervision (verbal cues/safety issues);5: Chair or W/C > Bed: Supervision (verbal cues/safety issues)  FIM - Locomotion: Wheelchair Distance: 20 Locomotion: Wheelchair: 0: Activity did not occur FIM -  Locomotion: Ambulation Locomotion: Ambulation Assistive Devices: Designer, industrial/product Ambulation/Gait Assistance: 5: Supervision Locomotion: Ambulation: 6: Travels 150 ft or more independently/takes more than reasonable amount of time  Comprehension Comprehension Mode: Auditory Comprehension: 7-Follows complex conversation/direction: With no assist  Expression Expression Mode: Verbal Expression: 7-Expresses complex ideas: With no assist  Social Interaction Social Interaction: 7-Interacts appropriately with others - No medications needed.  Problem Solving Problem Solving: 7-Solves complex problems: Recognizes & self-corrects  Memory Memory: 7-Complete Independence: No helper   Medical Problem List and Plan:  1. DVT Prophylaxis/Anticoagulation: Mechanical: Sequential compression devices, below knee Bilateral lower extremities, movibility 2. Pain Management: neurontin 200 tid helping dysesthesias. He's ok going home on this dose. 3. Mood: motivated. Monitor for now.  4. Neuropsych: This patient is capable of making decisions on his/her own behalf.  5. HTN: BP has been variable. Monitor with bid checks. Resumed BB and Norvasc . Started prinivil-will titrate up to 10mg . bp well controlled  6. CAD: monitor for symptoms. Resume low dose toprol and zocor.  8. Constipation: on Senna S and miralax bid.   LOS (Days) 19 A FACE TO FACE EVALUATION WAS PERFORMED  Bluford Sedler T 04/03/2012, 7:35 AM

## 2012-04-03 NOTE — Progress Notes (Signed)
Social Work Discharge Note Discharge Note  The overall goal for the admission was met for:   Discharge location: Yes-HOME WITH WIFE PROVIDING 24 HOUR CARE  Length of Stay: Yes-19 DAYS  Discharge activity level: Yes-SUPERVISION/MIN LEVEL  Home/community participation: Yes  Services provided included: MD, RD, PT, OT, SLP, RN, CM, TR, Pharmacy, Neuropsych and SW  Financial Services: Private Insurance: FED BCBS  Follow-up services arranged: Outpatient: [facility] OP-PT,OT  9/9 2:00-4;00 PM, DME: [facility], [facility] and Patient/Family has no preference for HH/DME agencies  Comments (or additional information):WIFE COMPLETED FAMILY EDUCATION AND COMFORTABLE WITH PT'S CARE. BOTH PLEASED WITH HIS PROGRESS WHILE HERE.  Patient/Family verbalized understanding of follow-up arrangements: Yes  Individual responsible for coordination of the follow-up plan: [facility] AND PT  Confirmed correct DME delivered: Lucy Chris 04/03/2012    Lucy Chris

## 2012-04-04 NOTE — Discharge Summary (Signed)
NAMESHILO, PAUWELS NO.:  [id number]  MEDICAL RECORD NO.:  [id number]  LOCATION:  4145                         FACILITY:  [facility]  PHYSICIAN:  Ranelle Oyster, M.D.DATE OF BIRTH:  [birth year]  DATE OF ADMISSION:  03/15/2012 DATE OF DISCHARGE:  04/03/2012                              DISCHARGE SUMMARY   DISCHARGE DIAGNOSES: 1. Spinal cord injury with Central cord syndrome. 2. Hypertension. 3. Dysesthesias. 4. Coronary artery disease.  HISTORY OF PRESENT ILLNESS:  [name] is a 61 year old male with history of coronary artery disease, 24-hour history of diarrhea, who got dizzy due to near syncope and fell on March 10, 2012.  The patient had complaints of pain in head and neck as well as tingling in bilateral hands.  MRI of cervical spine revealed intraspinous edema at C3-C7 with multilevel spondylosis most prominent at C4-C5 with edema and gliosis and cord compression.  CT of head was negative for acute changes.  CT of maxillofacial area showed comminuted depressed bilateral nasal bone fractures with bowing of nasal septum to the right.  He was evaluated by Dr. Venetia Maxon as well as Dr. Kelly Splinter.  He was cleared for surgery by Dr. Myrtis Ser.  On March 13, 2012, the patient underwent ACDF with PEEK cages at C4-C5 by Dr. Venetia Maxon and closed reduction of nasal fracture by Dr. Kelly Splinter.  Postop, continues to have problems with decreased coordination as well as motor planning issues. Bilateral upper extremity has persistence weakness as well as bilateral lower extremity instability.  He was evaluated by Rehab team and CIR was recommended for progression.  PAST MEDICAL HISTORY:  The patient was independent and working full-time prior to admission.  RECENT LABS:  Check of lytes, revealed sodium 136, potassium 3.6, chloride 101, CO2 26, BUN 14, creatinine 0.94, glucose 89.  CBC done, reveals hemoglobin 14.1, hematocrit 39.8, white count 7.8, platelets 227.  Cervical  spine films of March 24, 2012, revealed no visible cervical fracture or traumatic subluxation, unremarkable appearing C4-C5 ACDF.  HOSPITAL COURSE:  [name] was admitted to Rehab on March 15, 2012, for inpatient therapies to consist of PT, OT at least 3 hours 5 days a week.  Past-admission, physiatrist, rehab RN, and therapy team have worked together to provide customized collaborative interdisciplinary care.  Rehab RN has worked with the patient on bowel and bladder program as well as med administration.  The patient's neck wound was monitored along and this has been healing well without any signs or symptoms of infection.  The patient's blood pressures were checked on b.i.d. basis.  Meds were adjusted due to issues with hypotension.  Blood pressures at time of discharge ranging from 120s- 130s systolics and 80s-70s diastolic.  The patient's p.o. intake has been good.  He has been continent of bowel and bladder.  His neurontin Was titrated for dysesthesias. The patient has been continent of bowel and bladder.  The patient has had improvement in upper extremity strength. Sensory exam is 1/2 in hands and feet.  Bilateral biceps and deltoids  are 3 to 3+/5, triceps at 2 to 3/5.  WE and WF at 1 to 2/5.  Hand intrinsics at 3/5. Right hand is stronger than left.  Lower extremity strength is 4- to 4/5.  His lower extremity instability is much improved.  During the patient's stay in Rehab, weekly team conferences were held to monitor the patient's progress, set goals, as well as discuss barriers to discharge.  Physical Therapy has worked with the patient on mobility, strengthening as well as balance.  The patient is currently at supervision level for transfers, supervision level for ambulating greater than 200 feet with rolling walker.  He is able to navigate 12 stairs with supervision.  OT has worked with the patient on self-care tasks as well as strengthening of upper  extremities and focused on improving ability to compensate for deficits.  The patient is showing improvement in awareness as well as coordination.  He is currently at supervision for all BADLs.  Family education was done with wife who can provide supervision as needed, past-discharge.  Further followup outpatient PT, OT to continue at [facility], past-discharge.  On April 03, 2012, the patient is discharged to home in improved condition.  DISCHARGE MEDICATIONS: 1. Gabapentin 200 mg p.o. t.i.d. 2. Lisinopril 10 mg p.o. per day. 3. OxyIR 5 mg 1-2 p.o. q.6 hours p.r.n. moderate-to-severe pain, #100     Rx. 4. MiraLax 17 g in 8 ounces p.o. b.i.d. 5. Senokot-S 2 p.o. b.i.d. 6. Norvasc 5 mg p.o. per day. 7. Aspirin 81 mg p.o. per day. 8. Fish oil tabs 3 p.o. per day. 9. Toprol-XL 50 mg p.o. per day. 10.Multivitamin 1 per day. 11.Nitroglycerin sublingual p.r.n. chest pain. 12.Zocor 80 mg p.o. per day.  DIET:  Regular.  ACTIVITY:  As tolerated at intermittent supervision level.  SPECIAL INSTRUCTIONS:  No strenuous activity, lifting or driving until cleared by Dr. Venetia Maxon.  WOUND CARE:  Keep area clean and dry.  SPECIAL INSTRUCTIONS:  Wear collar at all times.  Do not lift anything over 5 pounds.  Neuro outpatient rehab to begin on April 08, 2012, at 2 p.m. to 4 p.m.  FOLLOWUP:  The patient to follow up with Dr. Venetia Maxon in 2 weeks for postop check.  Follow up with Dr. Kelly Splinter for postop check from nasal fracture. Follow up with Dr. Shaune Pollack for posthospital check on April 10, 2012, at 11 a.m.  Follow up with Dr. Faith Rogue on April 29, 2012, at 10:30 a.m.     Delle Reining, P.A.   ______________________________ Ranelle Oyster, M.D.    PL/MEDQ  D:  04/03/2012  T:  04/04/2012  Job:  [id number]  cc:   Croston Dull, M.D. Claire Sanger, DO Danae Orleans. Venetia Maxon, M.D.

## 2012-04-04 NOTE — Progress Notes (Signed)
Physical Therapy Discharge Summary  Patient Details  Name: Edward [name] MRN: [id number] Date of Birth: 25-May-1951  Today's Date: 04/02/2012    Patient has met 8 of 8 long term goals due to improved activity tolerance, improved balance, increased strength, ability to compensate for deficits, functional use of  right upper extremity, right lower extremity, left upper extremity and left lower extremity and improved coordination.  Patient to discharge at an ambulatory level Supervision.   Patient's care partner is independent to provide the necessary physical assistance at discharge. Pt has made excellent progress while in rehabilitation progressing from requiring +2 total assist to stand to now at a supervision level.   Reasons goals not met: NA  Recommendation:  Patient will benefit from ongoing skilled PT services in  outpatient setting to continue to advance safe functional mobility, address ongoing impairments in balance, increased need for assist and assistive device, impaired gait mechanics, and minimize fall risk.  Equipment: RW  Reasons for discharge: treatment goals met and discharge from hospital  Patient/family agrees with progress made and goals achieved: Yes  PT Discharge Precautions/Restrictions  Fall, Cervical collar    Cognition  Overall WFL, some decreased safety awareness, decreased knowledge of deficits noted  Sensation Sensation Light Touch: Impaired by gross assessment Stereognosis: Impaired by gross assessment Hot/Cold: Appears Intact Proprioception: Appears Intact Coordination Gross Motor Movements are Fluid and Coordinated: No Fine Motor Movements are Fluid and Coordinated: No Coordination and Movement Description: impaired dexterity, imparired grasp/releaseBil. LE numbness  Mobility Bed Mobility Rolling Right: 7: Independent Rolling Left: 7: Independent Left Sidelying to Sit: 7: Independent Supine to Sit: 6: Modified independent (Device/Increase  time) Transfers Sit to Stand: 6: Modified independent (Device/Increase time) Stand to Sit: 6: Modified independent (Device/Increase time) Stand Pivot Transfers: 6: Modified independent (Device/Increase time)Modified independent bed mobility  Locomotion  Ambulation Ambulation/Gait Assistance: 5: Supervision Ambulation Distance (Feet): 150 Feet Assistive device: Rolling walker Gait Gait Pattern: Impaired Gait Pattern: Step-through pattern;Decreased stance time - right;Narrow base of support;Decreased step length - left;Trunk flexed Stairs / Additional Locomotion Stairs: Yes Stairs Assistance: 5: Supervision Stair Management Technique: One rail Right Number of Stairs: 12    Balance Static Sitting Balance Static Sitting - Balance Support: Feet supported Static Sitting - Level of Assistance: 7: Independent Dynamic Sitting Balance Dynamic Sitting - Balance Support: Feet supported Dynamic Sitting - Level of Assistance: 7: Independent Static Standing Balance Static Standing - Balance Support: No upper extremity supported Static Standing - Level of Assistance: 5: Stand by assistance Dynamic Standing Balance Dynamic Standing - Balance Support: During functional activity Dynamic Standing - Level of Assistance: 5: Stand by assistance Extremity Assessment      RLE Assessment RLE Assessment: Exceptions to [facility] RLE Strength RLE Overall Strength Comments: Functional pt still very weak, Rt. quad significantly weaker than Lt.  LLE Assessment LLE Assessment: Exceptions to [facility] LLE Strength LLE Overall Strength Comments: Functionaly pt continues to have weakness.   See FIM for current functional status  [name] 04/04/2012, 6:32 PM

## 2012-04-08 ENCOUNTER — Ambulatory Visit: Payer: Federal, State, Local not specified - PPO | Attending: Physical Medicine & Rehabilitation | Admitting: *Deleted

## 2012-04-08 ENCOUNTER — Ambulatory Visit: Payer: Federal, State, Local not specified - PPO | Admitting: Occupational Therapy

## 2012-04-08 DIAGNOSIS — R269 Unspecified abnormalities of gait and mobility: Secondary | ICD-10-CM | POA: Insufficient documentation

## 2012-04-08 DIAGNOSIS — Z5189 Encounter for other specified aftercare: Secondary | ICD-10-CM | POA: Insufficient documentation

## 2012-04-08 DIAGNOSIS — M6281 Muscle weakness (generalized): Secondary | ICD-10-CM | POA: Insufficient documentation

## 2012-04-08 DIAGNOSIS — M79609 Pain in unspecified limb: Secondary | ICD-10-CM | POA: Insufficient documentation

## 2012-04-09 ENCOUNTER — Telehealth: Payer: Self-pay

## 2012-04-09 NOTE — Telephone Encounter (Signed)
Patient called requesting a refill of his medication but did not state which one.

## 2012-04-09 NOTE — Telephone Encounter (Signed)
Pt needs a refill on Oxycodone. He was just given a prescription on 04/03/12 for #100. Is it okay to refill?

## 2012-04-10 ENCOUNTER — Telehealth: Payer: Self-pay | Admitting: *Deleted

## 2012-04-10 MED ORDER — TRAMADOL HCL 50 MG PO TABS: 50.0000 mg | ORAL_TABLET | Freq: Four times a day (QID) | ORAL | Status: AC | PRN

## 2012-04-10 NOTE — Telephone Encounter (Signed)
Discharge summary says q6prn. He may have tramadol 50mg  q6 prn #60. His pain in no way appeared that severe to me in the hospital.

## 2012-04-10 NOTE — Telephone Encounter (Signed)
Pt is aware of Dr. Riley Kill response.

## 2012-04-10 NOTE — Telephone Encounter (Signed)
Pt aware that medication has been sent in for him.

## 2012-04-10 NOTE — Telephone Encounter (Signed)
Spoke to pt about Dr. Riley Kill not refilling Oxycodone for him. He was given #100 on 04/03/12 by Pam at the hospital, she advised him to take them every 3 hours, he has #12 left.  He is wanting to know if there is anything else Dr. Riley Kill can give him.  Please advise.

## 2012-04-10 NOTE — Telephone Encounter (Signed)
No it's not ok.  By those counts (depending on what he has left), he is taking 12-15 per day. That's entirely too much. The prescription is for 5-10 mg q6 prn. At most, given that schedule ,he would take in a week is #56. He should not need anything until next week.

## 2012-04-16 ENCOUNTER — Encounter (HOSPITAL_BASED_OUTPATIENT_CLINIC_OR_DEPARTMENT_OTHER): Payer: Self-pay | Admitting: *Deleted

## 2012-04-16 ENCOUNTER — Emergency Department (HOSPITAL_BASED_OUTPATIENT_CLINIC_OR_DEPARTMENT_OTHER)
Admission: EM | Admit: 2012-04-16 | Discharge: 2012-04-16 | Disposition: A | Discharge status: Home / Self Care | Payer: Federal, State, Local not specified - PPO | Attending: Emergency Medicine | Admitting: Emergency Medicine

## 2012-04-16 ENCOUNTER — Emergency Department (HOSPITAL_BASED_OUTPATIENT_CLINIC_OR_DEPARTMENT_OTHER): Payer: Federal, State, Local not specified - PPO

## 2012-04-16 DIAGNOSIS — E876 Hypokalemia: Secondary | ICD-10-CM | POA: Insufficient documentation

## 2012-04-16 DIAGNOSIS — I2510 Atherosclerotic heart disease of native coronary artery without angina pectoris: Secondary | ICD-10-CM | POA: Insufficient documentation

## 2012-04-16 DIAGNOSIS — E785 Hyperlipidemia, unspecified: Secondary | ICD-10-CM | POA: Insufficient documentation

## 2012-04-16 DIAGNOSIS — K5641 Fecal impaction: Secondary | ICD-10-CM

## 2012-04-16 DIAGNOSIS — Z87891 Personal history of nicotine dependence: Secondary | ICD-10-CM | POA: Insufficient documentation

## 2012-04-16 DIAGNOSIS — K5900 Constipation, unspecified: Secondary | ICD-10-CM | POA: Insufficient documentation

## 2012-04-16 LAB — BASIC METABOLIC PANEL
BUN: 12 mg/dL (ref 6–23)
CO2: 29 mEq/L (ref 19–32)
Calcium: 9.6 mg/dL (ref 8.4–10.5)
Chloride: 101 mEq/L (ref 96–112)
Creatinine, Ser: 1 mg/dL (ref 0.50–1.35)
GFR calc Af Amer: >90 mL/min (ref >90)
GFR calc non Af Amer: 79 mL/min — ABNORMAL LOW (ref >90)
Glucose, Bld: 101 mg/dL — ABNORMAL HIGH (ref 70–99)
Potassium: 4.3 mEq/L (ref 3.5–5.1)
Sodium: 137 mEq/L (ref 135–145)

## 2012-04-16 LAB — CBC WITH DIFFERENTIAL/PLATELET
Basophils Absolute: 0 10*3/uL (ref 0.0–0.1)
Basophils Relative: 0 % (ref 0–1)
Eosinophils Absolute: 0.2 10*3/uL (ref 0.0–0.7)
Eosinophils Relative: 2 % (ref 0–5)
HCT: 36.5 % — ABNORMAL LOW (ref 39.0–52.0)
Hemoglobin: 12.9 g/dL — ABNORMAL LOW (ref 13.0–17.0)
Lymphocytes Relative: 15 % (ref 12–46)
Lymphs Abs: 1.2 10*3/uL (ref 0.7–4.0)
MCH: 31.3 pg (ref 26.0–34.0)
MCHC: 35.3 g/dL (ref 30.0–36.0)
MCV: 88.6 fL (ref 78.0–100.0)
Monocytes Absolute: 0.5 10*3/uL (ref 0.1–1.0)
Monocytes Relative: 6 % (ref 3–12)
Neutro Abs: 6.4 10*3/uL (ref 1.7–7.7)
Neutrophils Relative %: 77 % (ref 43–77)
Platelets: 237 10*3/uL (ref 150–400)
RBC: 4.12 MIL/uL — ABNORMAL LOW (ref 4.22–5.81)
RDW: 11.9 % (ref 11.5–15.5)
WBC: 8.3 10*3/uL (ref 4.0–10.5)

## 2012-04-16 MED ORDER — DOCUSATE SODIUM 100 MG PO CAPS: 100.0000 mg | ORAL_CAPSULE | Freq: Two times a day (BID) | ORAL | Status: DC

## 2012-04-16 MED ORDER — FLEET ENEMA 7-19 GM/118ML RE ENEM
1.0000 | ENEMA | Freq: Once | RECTAL | Status: AC
  Administered 2012-04-16: 1 via RECTAL
  Filled 2012-04-16: qty 1

## 2012-04-16 NOTE — ED Notes (Signed)
Constipation and rectal bleeding x 3 days.

## 2012-04-16 NOTE — ED Provider Notes (Signed)
History     CSN: [id number] Arrival date & time 04/16/12  7829 First MD Initiated Contact with Patient 04/16/12 1916      Chief Complaint  Patient presents with  . Constipation    HPI The patient presents to the emergency room with complaints of constipation. He last had a bowel movement on Sunday. Patient was in the hospital recently for a cervical spine injury last month. The patient had surgery associated with that and has been taking narcotic pain medications. Patient has been feeling constipated for the last 3 days. He has been trying over-the-counter including milk of magnesia and enemas without relief. He has had rectal irritation now associated with that and also noticed some blood in his stools. He denies any fevers or vomiting. He denies any abdominal pain. Past Medical History  Diagnosis Date  . Measles   . Mumps   . Chicken pox   . Whooping cough   . Pneumonia   . Hypertension   . Coronary artery disease     a. 1999 s/p MI with cath/PCI;  b. 05/1999 Ex Cardiolite EF 68%, no ishcemia.  . Dyslipidemia   . Hypokalemia   . History of tobacco abuse   . Near syncope   . Nasal bones, closed fracture     a. 02/2012 in setting of presyncope/fall  . Injury of cervical spine     a. 02/2012 C4/5    Past Surgical History  Procedure Date  . [place] tendon surgery 1989  . Achilles tendon surgery 1989  . Cardiac catheterization 06/28/1998    single vessel CAD involving the distal RCA/PTCA and stenting of the distal RCA//EF- 50-55%  . Nm myoview ltd 05/17/2009    normal stress nuclear study/no evidence of ischemia/EF- 68%  . Anterior cervical decomp/discectomy fusion 03/13/2012    Procedure: ANTERIOR CERVICAL DECOMPRESSION/DISCECTOMY FUSION 1 LEVEL;  Surgeon: Joseph Stern, MD;  Location: [facility];  Service: Neurosurgery;  Laterality: N/A;  Anterior Cervical Decompression/Fusion. Cervical four-five.  . Closed reduction nasal fracture 03/13/2012    Procedure: CLOSED REDUCTION  NASAL FRACTURE;  Surgeon: Claire Sanger, DO;  Location: [facility]UR[facility];  Service: Plastics;  Laterality: N/A;  Internal and external splinting of nasal fracture    Family History  Problem Relation Age of Onset  . Heart attack Father     63  . Cancer Mother     72     History  Substance Use Topics  . Smoking status: Former Smoker -- 1.0 packs/day for 20 years    Types: Cigarettes    Quit date: 11/02/1986  . Smokeless tobacco: Not on file  . Alcohol Use: Yes     occasional alcoholic beverage.      Review of Systems  All other systems reviewed and are negative.    Allergies  Review of patient's allergies indicates no known allergies.  Home Medications   Current Outpatient Rx  Name Route Sig Dispense Refill  . AMLODIPINE BESYLATE 5 MG PO TABS Oral Take 5 mg by mouth daily.    . ASPIRIN 81 MG PO TABS Oral Take 81 mg by mouth daily.    . OMEGA-3 FATTY ACIDS 1000 MG PO CAPS Oral Take 3 g by mouth daily. Take three tablets by mouth once daily    . GABAPENTIN 100 MG PO CAPS Oral Take 2 capsules (200 mg total) by mouth 3 (three) times daily. 180 capsule 1  . LISINOPRIL 10 MG PO TABS Oral Take 1 tablet (10 mg total) by  mouth daily. 30 tablet 1  . METOPROLOL SUCCINATE ER 50 MG PO TB24 Oral Take 50 mg by mouth daily. Take with or immediately following a meal.    . ONE-DAILY MULTI VITAMINS PO TABS Oral Take 1 tablet by mouth daily.    Marland Kitchen NITROGLYCERIN 0.4 MG SL SUBL  place 1 tablet under the tongue if needed for chest pain 25 tablet PRN  . SENNOSIDES-DOCUSATE SODIUM 8.6-50 MG PO TABS Oral Take 2 tablets by mouth 2 (two) times daily.    Marland Kitchen SIMVASTATIN 80 MG PO TABS Oral Take 80 mg by mouth at bedtime.    . TRAMADOL HCL 50 MG PO TABS Oral Take 1 tablet (50 mg total) by mouth every 6 (six) hours as needed for pain. 60 tablet 0    BP 109/77  Pulse 69  Temp 98.7 F (37.1 C) (Oral)  Resp 20  SpO2 100%  Physical Exam  Nursing note and vitals reviewed. Constitutional: He appears  well-developed and well-nourished. No distress.  HENT:  Head: Normocephalic and atraumatic.  Right Ear: External ear normal.  Left Ear: External ear normal.  Eyes: Conjunctivae normal are normal. Right eye exhibits no discharge. Left eye exhibits no discharge. No scleral icterus.  Neck: Neck supple. No tracheal deviation present.  Cardiovascular: Normal rate, regular rhythm and intact distal pulses.   Pulmonary/Chest: Effort normal and breath sounds normal. No stridor. No respiratory distress. He has no wheezes. He has no rales.  Abdominal: Soft. Bowel sounds are normal. He exhibits no distension. There is no tenderness. There is no rebound and no guarding.  Genitourinary: Rectal exam shows no fissure and no mass.       Fecal impaction distally with hard stool, small amount of blood noted next in with the stool  Musculoskeletal: He exhibits no edema and no tenderness.  Neurological: He is alert. He has normal strength. No sensory deficit. Cranial nerve deficit:  no gross defecits noted. He exhibits normal muscle tone. He displays no seizure activity. Coordination normal.  Skin: Skin is warm and dry. No rash noted.  Psychiatric: He has a normal mood and affect.    ED Course  Fecal disimpaction Performed by: Linwood Dibbles R Authorized by: Linwood Dibbles R Consent: Verbal consent obtained. Local anesthesia used: no Patient sedated: no Patient tolerance: Patient tolerated the procedure well with no immediate complications. Comments: Large amount of hard stool removed during digital manual disimpaction   Labs Reviewed  CBC WITH DIFFERENTIAL - Abnormal; Notable for the following:    RBC 4.12 (*)     Hemoglobin 12.9 (*)     HCT 36.5 (*)     All other components within normal limits  BASIC METABOLIC PANEL - Abnormal; Notable for the following:    Glucose, Bld 101 (*)     GFR calc non Af Amer 79 (*)     All other components within normal limits   No results found.    MDM  Pt improved  after disimpaction and enema.  Pt had good results and he states he feels completely better.  Xray was ordered earlier but not completed.  At this time, pt is ready for discharge.  Will cancel xray.  Pt encouraged to return for fever, vomiting, abdominal pain.  Discussed mild decrease in Hgb and follow up with pcp but suspect this is related to the rectal irritation and hemorrhoids noted on exam.        Celene Kras, MD 04/16/12 2201

## 2012-04-16 NOTE — ED Notes (Signed)
MD at bedside. 

## 2012-04-17 ENCOUNTER — Ambulatory Visit: Payer: Federal, State, Local not specified - PPO | Admitting: Physical Therapy

## 2012-04-19 ENCOUNTER — Ambulatory Visit: Payer: Federal, State, Local not specified - PPO | Admitting: Physical Therapy

## 2012-04-22 ENCOUNTER — Ambulatory Visit: Payer: Federal, State, Local not specified - PPO | Admitting: Physical Therapy

## 2012-04-22 ENCOUNTER — Encounter: Payer: Federal, State, Local not specified - PPO | Admitting: Occupational Therapy

## 2012-04-24 ENCOUNTER — Ambulatory Visit: Payer: Federal, State, Local not specified - PPO | Admitting: Physical Therapy

## 2012-04-24 ENCOUNTER — Encounter: Payer: Federal, State, Local not specified - PPO | Admitting: Occupational Therapy

## 2012-04-29 ENCOUNTER — Encounter: Payer: Federal, State, Local not specified - PPO | Admitting: Physical Medicine & Rehabilitation

## 2012-04-30 ENCOUNTER — Ambulatory Visit: Payer: Federal, State, Local not specified - PPO | Admitting: Physical Therapy

## 2012-04-30 ENCOUNTER — Encounter: Payer: Federal, State, Local not specified - PPO | Admitting: Occupational Therapy

## 2012-05-02 ENCOUNTER — Encounter: Payer: Federal, State, Local not specified - PPO | Admitting: Occupational Therapy

## 2012-05-02 ENCOUNTER — Ambulatory Visit: Payer: Federal, State, Local not specified - PPO | Admitting: Physical Therapy

## 2012-05-03 ENCOUNTER — Telehealth: Payer: Self-pay | Admitting: Cardiovascular Disease

## 2012-05-03 NOTE — Telephone Encounter (Signed)
Thanks, I need to see Edward [name].  He was recently in the hospital with a neck fracture.

## 2012-05-03 NOTE — Telephone Encounter (Signed)
Patient states he saw Dr. Elease Hashimoto in the past despite the Chart HX, would like to speak with nurse regarding elevated Bp, he can be reached at hm#

## 2012-05-03 NOTE — Telephone Encounter (Signed)
Pt called and given appoint for 10/21 at 3:30 pm--pt agrees

## 2012-05-03 NOTE — Telephone Encounter (Signed)
F/u  Returning call back to nurse.  

## 2012-05-03 NOTE — Telephone Encounter (Signed)
LMTCB

## 2012-05-03 NOTE — Telephone Encounter (Signed)
Pt calling stating his bp  High and altho he has an appoint sched with dr Elease Hashimoto on oct 21  he wants to be seen sooner --looking at appoint sched i do not see this appoint in oct--advised to call PCP and advised i would put him on list to see dr Elease Hashimoto if another pt cancels appoint--pt agrees

## 2012-05-07 ENCOUNTER — Ambulatory Visit: Payer: Federal, State, Local not specified - PPO | Admitting: Physical Therapy

## 2012-05-07 ENCOUNTER — Encounter: Payer: Federal, State, Local not specified - PPO | Admitting: Occupational Therapy

## 2012-05-09 ENCOUNTER — Encounter: Payer: Federal, State, Local not specified - PPO | Admitting: Occupational Therapy

## 2012-05-09 ENCOUNTER — Ambulatory Visit: Payer: Federal, State, Local not specified - PPO | Admitting: Physical Therapy

## 2012-05-20 ENCOUNTER — Encounter: Payer: Self-pay | Admitting: Cardiovascular Disease

## 2012-05-20 ENCOUNTER — Ambulatory Visit (INDEPENDENT_AMBULATORY_CARE_PROVIDER_SITE_OTHER): Payer: Federal, State, Local not specified - PPO | Admitting: Cardiovascular Disease

## 2012-05-20 VITALS — BP 154/96 | HR 56 | Ht 72.0 in | Wt 197.0 lb

## 2012-05-20 DIAGNOSIS — I10 Essential (primary) hypertension: Secondary | ICD-10-CM

## 2012-05-20 DIAGNOSIS — I2510 Atherosclerotic heart disease of native coronary artery without angina pectoris: Secondary | ICD-10-CM

## 2012-05-20 NOTE — Assessment & Plan Note (Signed)
Edward [name] seems to be feeling pretty well. His blood pressure is a little bit elevated today. Right now he is having lots of problems with symptomatic orthostatic hypotension. Given these episodes of hypotension and the fact that he had a spinal cord injury/neck fracture as a result of an episode of orthostatic hypotension I do not want to increase his medications any further episodes point. He was previously on HCTZ as well as his current medications.  We will have him continue with rehabilitation. We will be more aggressive with his blood pressure control when he  is through rehabilitation and hopefully once his episodes of orthostasis had improved or resolved.  I see him again in 6 months for followup visit.

## 2012-05-20 NOTE — Progress Notes (Signed)
Edward [name] Date of Birth  July 07, 1951       [facility]    [place] [address]  [address] [place], [place]  [address]   [place], [place]  [address] [phone]     [phone]   Fax  [phone]    Fax [phone]  Problem List: Coronary artery disease-status post inferior wall myocardial infarction-status post PTCA and stenting of his right coronary artery 2. Dyslipidemia 3. Hypertension 4. Hypokalemia 5. Recent fall with subsequent neck fracture  History of Present Illness:  Edward [name] is a 61 year old gentleman. Who was recently hospitalized for a neck fracture after he had a syncopal episode.  It has been to the bathroom. He's not had any subsequent episodes of syncope.  He's continued to have some episodes of dizziness. Not had any episodes of chest pain or shortness breath to  Current Outpatient Prescriptions on File Prior to Visit  Medication Sig Dispense Refill  . amLODipine (NORVASC) 5 MG tablet Take 5 mg by mouth daily.      . ANDROGEL PUMP 20.25 MG/ACT (1.62%) GEL Place 1 application onto the skin Daily.      Marland Kitchen aspirin 81 MG tablet Take 81 mg by mouth daily.      Marland Kitchen docusate sodium (COLACE) 100 MG capsule Take 1 capsule (100 mg total) by mouth every 12 (twelve) hours.  60 capsule  0  . fish oil-omega-3 fatty acids 1000 MG capsule Take 3 g by mouth daily. Take three tablets by mouth once daily      . lisinopril (PRINIVIL,ZESTRIL) 10 MG tablet Take 1 tablet (10 mg total) by mouth daily.  30 tablet  1  . metoprolol succinate (TOPROL-XL) 50 MG 24 hr tablet Take 50 mg by mouth daily. Take with or immediately following a meal.      . Multiple Vitamin (MULTIVITAMIN) tablet Take 1 tablet by mouth daily.      . nitroGLYCERIN (NITROSTAT) 0.4 MG SL tablet place 1 tablet under the tongue if needed for chest pain  25 tablet  PRN  . senna-docusate (SENOKOT-S) 8.6-50 MG per tablet Take 2 tablets by mouth 2 (two) times daily.        . simvastatin (ZOCOR) 80 MG tablet Take 80 mg by mouth at bedtime.      Marland Kitchen DISCONTD: gabapentin (NEURONTIN) 100 MG capsule Take 2 capsules (200 mg total) by mouth 3 (three) times daily.  180 capsule  1    No Known Allergies  Past Medical History  Diagnosis Date  . Measles   . Mumps   . Chicken pox   . Whooping cough   . Pneumonia   . Hypertension   . Coronary artery disease     a. 1999 s/p MI with cath/PCI;  b. 05/1999 Ex Cardiolite EF 68%, no ishcemia.  . Dyslipidemia   . Hypokalemia   . History of tobacco abuse   . Near syncope   . Nasal bones, closed fracture     a. 02/2012 in setting of presyncope/fall  . Injury of cervical spine     a. 02/2012 C4/5    Past Surgical History  Procedure Date  . Petalla tendon surgery 1989  . Achilles tendon surgery 1989  . Cardiac catheterization 06/28/1998    single vessel CAD involving the distal RCA/PTCA and stenting of the distal RCA//EF- 50-55%  . Nm myoview ltd 05/17/2009    normal stress nuclear study/no evidence of ischemia/EF- 68%  .  Anterior cervical decomp/discectomy fusion 03/13/2012    Procedure: ANTERIOR CERVICAL DECOMPRESSION/DISCECTOMY FUSION 1 LEVEL;  Surgeon: Maeola Harman, MD;  Location: [facility];  Service: Neurosurgery;  Laterality: N/A;  Anterior Cervical Decompression/Fusion. Cervical four-five.  . Closed reduction nasal fracture 03/13/2012    Procedure: CLOSED REDUCTION NASAL FRACTURE;  Surgeon: Wayland Denis, DO;  Location: [facility];  Service: Plastics;  Laterality: N/A;  Internal and external splinting of nasal fracture    History  Smoking status  . Former Smoker -- 1.0 packs/day for 20 years  . Types: Cigarettes  . Quit date: 11/02/1986  Smokeless tobacco  . Not on file    History  Alcohol Use  . Yes    occasional alcoholic beverage.    Family History  Problem Relation Age of Onset  . Heart attack Father     71  . Cancer Mother     15    Reviw of Systems:  Reviewed in the HPI.  All other  systems are negative.  Physical Exam: Blood pressure 154/96, pulse 56, height 6' (1.829 m), weight 197 lb (89.359 kg), SpO2 98.00%. General: Well developed, well nourished, in no acute distress.  Head: Normocephalic, atraumatic, sclera non-icteric, mucus membranes are moist,   Neck: Supple. Carotids are 2 + without bruits. No JVD  Lungs: Clear bilaterally to auscultation.  Heart: regular rate.  normal  S1 S2. No murmurs, gallops or rubs.  Abdomen: Soft, non-tender, non-distended with normal bowel sounds. No hepatomegaly. No rebound/guarding. No masses.  Msk:  Strength and tone are normal  Extremities: No clubbing or cyanosis. No edema.  Distal pedal pulses are 2+ and equal bilaterally.  Neuro: Alert and oriented X 3. Moves all extremities spontaneously.  Psych:  Responds to questions appropriately with a normal affect.  ECG:  Assessment / Plan:

## 2012-05-20 NOTE — Patient Instructions (Addendum)
Your physician wants you to follow-up in: 6 months  You will receive a reminder letter in the mail two months in advance. If you don't receive a letter, please call our office to schedule the follow-up appointment.  Your physician recommends that you continue on your current medications as directed. Please refer to the Current Medication list given to you today.  

## 2012-05-20 NOTE — Assessment & Plan Note (Signed)
Edward [name] has a history of previous inferior wall myocardial infarction. He's not had any symptoms. We will continue with the same medications

## 2012-06-18 ENCOUNTER — Encounter: Payer: Self-pay | Admitting: Cardiovascular Disease

## 2012-11-18 ENCOUNTER — Encounter: Payer: Self-pay | Admitting: *Deleted

## 2012-11-18 ENCOUNTER — Encounter: Payer: Self-pay | Admitting: Cardiovascular Disease

## 2012-11-18 ENCOUNTER — Ambulatory Visit (INDEPENDENT_AMBULATORY_CARE_PROVIDER_SITE_OTHER): Payer: Federal, State, Local not specified - PPO | Admitting: Cardiovascular Disease

## 2012-11-18 VITALS — BP 150/94 | HR 61 | Ht 72.0 in | Wt 197.0 lb

## 2012-11-18 DIAGNOSIS — I2510 Atherosclerotic heart disease of native coronary artery without angina pectoris: Secondary | ICD-10-CM

## 2012-11-18 DIAGNOSIS — I10 Essential (primary) hypertension: Secondary | ICD-10-CM

## 2012-11-18 NOTE — Assessment & Plan Note (Signed)
His blood pressure is a little bit elevated today. He ate some salty food yesterday. I've encouraged her to stick to a low salt. His blood pressure is typically in the normal range. We'll give him information on the [employer].

## 2012-11-18 NOTE — Patient Instructions (Addendum)
Your physician wants you to follow-up in: 6 months  You will receive a reminder letter in the mail two months in advance. If you don't receive a letter, please call our office to schedule the follow-up appointment.  REDUCE HIGH SODIUM FOODS LIKE CANNED SOUP, GRAVY, SAUCES, READY PREPARED FOODS LIKE FROZEN FOODS; LEAN CUISINE, LASAGNA. BACON, SAUSAGE, LUNCH MEAT, FAST FOODS.Marland Kitchen Hot dogs and chips  DASH Diet The DASH diet stands for "Dietary Approaches to Stop Hypertension." It is a healthy eating plan that has been shown to reduce high blood pressure (hypertension) in as little as 14 days, while also possibly providing other significant health benefits. These other health benefits include reducing the risk of breast cancer after menopause and reducing the risk of type 2 diabetes, heart disease, colon cancer, and stroke. Health benefits also include weight loss and slowing kidney failure in patients with chronic kidney disease.  DIET GUIDELINES  Limit salt (sodium). Your diet should contain less than 1500 mg of sodium daily.  Limit refined or processed carbohydrates. Your diet should include mostly whole grains. Desserts and added sugars should be used sparingly.  Include small amounts of heart-healthy fats. These types of fats include nuts, oils, and tub margarine. Limit saturated and trans fats. These fats have been shown to be harmful in the body. CHOOSING FOODS  The following food groups are based on a 2000 calorie diet. See your Registered Dietitian for individual calorie needs. Grains and Grain Products (6 to 8 servings daily)  Eat More Often: Whole-wheat bread, brown rice, whole-grain or wheat pasta, quinoa, popcorn without added fat or salt (air popped).  Eat Less Often: White bread, white pasta, white rice, cornbread. Vegetables (4 to 5 servings daily)  Eat More Often: Fresh, frozen, and canned vegetables. Vegetables may be raw, steamed, roasted, or grilled with a minimal amount of  fat.  Eat Less Often/Avoid: Creamed or fried vegetables. Vegetables in a cheese sauce. Fruit (4 to 5 servings daily)  Eat More Often: All fresh, canned (in natural juice), or frozen fruits. Dried fruits without added sugar. One hundred percent fruit juice ( cup [237 mL] daily).  Eat Less Often: Dried fruits with added sugar. Canned fruit in light or heavy syrup. [employer], Fish, and Poultry (2 servings or less daily. One serving is 3 to 4 oz [85-114 g]).  Eat More Often: Ninety percent or leaner ground beef, tenderloin, sirloin. Round cuts of beef, chicken breast, [place] breast. All fish. Grill, bake, or broil your meat. Nothing should be fried.  Eat Less Often/Avoid: Fatty cuts of meat, [place], or chicken leg, thigh, or wing. Fried cuts of meat or fish. Dairy (2 to 3 servings)  Eat More Often: Low-fat or fat-free milk, low-fat plain or light yogurt, reduced-fat or part-skim cheese.  Eat Less Often/Avoid: Milk (whole, 2%).Whole milk yogurt. Full-fat cheeses. Nuts, Seeds, and Legumes (4 to 5 servings per week)  Eat More Often: All without added salt.  Eat Less Often/Avoid: Salted nuts and seeds, canned beans with added salt. Fats and Sweets (limited)  Eat More Often: Vegetable oils, tub margarines without trans fats, sugar-free gelatin. Mayonnaise and salad dressings.  Eat Less Often/Avoid: Coconut oils, palm oils, butter, stick margarine, cream, half and half, cookies, candy, pie. FOR MORE INFORMATION The Dash Diet Eating Plan: www.dashdiet.org Document Released: 07/06/2011 Document Revised: 10/09/2011 Document Reviewed: 07/06/2011 [facility] Patient Information 2013 [place], [place].

## 2012-11-18 NOTE — Assessment & Plan Note (Signed)
Edward [name] is doing well. He's not had any episodes of chest pain or shortness breath. He needs to get out and exercise more his been limited by his neck fracture.

## 2012-11-18 NOTE — Progress Notes (Signed)
[name] Date of Birth  1951-05-22       [facility]    [place] [address]  [address] [place], [place]  [address]   [place], [place]  [address] [phone]     [phone]   Fax  [phone]    Fax [phone]  Problem List: Coronary artery disease-status post inferior wall myocardial infarction-status post PTCA and stenting of his right coronary artery 2. Dyslipidemia 3. Hypertension 4. Hypokalemia 5. Recent fall with subsequent neck fracture  History of Present Illness:  Key is a 62 year old gentleman. Who was recently hospitalized for a neck fracture after he had a syncopal episode.  It has been to the bathroom. He's not had any subsequent episodes of syncope.  He's continued to have some episodes of dizziness. Not had any episodes of chest pain or shortness breath.  November 18, 2012: Phillippe is doing fairly well from a cardiac standpoint. He has not had any episodes of chest pain or shortness breath. He is still recovering from his neck fracture in August of 2013. He still has some residual hand tingling and numbness.  He also has some numbness in his feet.  He also has developed a peripheral neuropathy.   Current Outpatient Prescriptions on File Prior to Visit  Medication Sig Dispense Refill  . amLODipine (NORVASC) 5 MG tablet Take 5 mg by mouth daily.      . ANDROGEL PUMP 20.25 MG/ACT (1.62%) GEL Place 1 application onto the skin Daily.      Marland Kitchen aspirin 81 MG tablet Take 81 mg by mouth daily.      Marland Kitchen docusate sodium (COLACE) 100 MG capsule Take 1 capsule (100 mg total) by mouth every 12 (twelve) hours.  60 capsule  0  . fish oil-omega-3 fatty acids 1000 MG capsule Take 3 g by mouth daily. Take three tablets by mouth once daily      . gabapentin (NEURONTIN) 100 MG capsule Take 300 mg by mouth.      Marland Kitchen lisinopril (PRINIVIL,ZESTRIL) 10 MG tablet Take 1 tablet (10 mg total) by mouth daily.  30 tablet  1  .  metoprolol succinate (TOPROL-XL) 50 MG 24 hr tablet Take 50 mg by mouth daily. Take with or immediately following a meal.      . Multiple Vitamin (MULTIVITAMIN) tablet Take 1 tablet by mouth daily.      . nitroGLYCERIN (NITROSTAT) 0.4 MG SL tablet place 1 tablet under the tongue if needed for chest pain  25 tablet  PRN  . simvastatin (ZOCOR) 80 MG tablet Take 80 mg by mouth at bedtime.       No current facility-administered medications on file prior to visit.    No Known Allergies  Past Medical History  Diagnosis Date  . Measles   . Mumps   . Chicken pox   . Whooping cough   . Pneumonia   . Hypertension   . Coronary artery disease     a. 1999 s/p MI with cath/PCI;  b. 05/1999 Ex Cardiolite EF 68%, no ishcemia.  . Dyslipidemia   . Hypokalemia   . History of tobacco abuse   . Near syncope   . Nasal bones, closed fracture     a. 02/2012 in setting of presyncope/fall  . Injury of cervical spine     a. 02/2012 C4/5    Past Surgical History  Procedure Laterality Date  . Petalla tendon surgery  1989  .  Achilles tendon surgery  1989  . Cardiac catheterization  06/28/1998    single vessel CAD involving the distal RCA/PTCA and stenting of the distal RCA//EF- 50-55%  . Nm myoview ltd  05/17/2009    normal stress nuclear study/no evidence of ischemia/EF- 68%  . Anterior cervical decomp/discectomy fusion  03/13/2012    Procedure: ANTERIOR CERVICAL DECOMPRESSION/DISCECTOMY FUSION 1 LEVEL;  Surgeon: Maeola Harman, MD;  Location: [facility];  Service: Neurosurgery;  Laterality: N/A;  Anterior Cervical Decompression/Fusion. Cervical four-five.  . Closed reduction nasal fracture  03/13/2012    Procedure: CLOSED REDUCTION NASAL FRACTURE;  Surgeon: Wayland Denis, DO;  Location: [facility];  Service: Plastics;  Laterality: N/A;  Internal and external splinting of nasal fracture    History  Smoking status  . Former Smoker -- 1.00 packs/day for 20 years  . Types: Cigarettes  . Quit date:  11/02/1986  Smokeless tobacco  . Not on file    History  Alcohol Use  . Yes    Comment: occasional alcoholic beverage.    Family History  Problem Relation Age of Onset  . Heart attack Father     35  . Cancer Mother     49    Reviw of Systems:  Reviewed in the HPI.  All other systems are negative.  Physical Exam: Blood pressure 150/94, pulse 61, height 6' (1.829 m), weight 197 lb (89.359 kg). General: Well developed, well nourished, in no acute distress.  Head: Normocephalic, atraumatic, sclera non-icteric, mucus membranes are moist,   Neck: Supple. Carotids are 2 + without bruits. No JVD  Lungs: Clear bilaterally to auscultation.  Heart: regular rate.  normal  S1 S2. No murmurs, gallops or rubs.  Abdomen: Soft, non-tender, non-distended with normal bowel sounds. No hepatomegaly. No rebound/guarding. No masses.  Msk:  Strength and tone are normal  Extremities: No clubbing or cyanosis. No edema.  Distal pedal pulses are 2+ and equal bilaterally.  Neuro: Alert and oriented X 3. Moves all extremities spontaneously.  Psych:  Responds to questions appropriately with a normal affect.  ECG: November 18, 2012 normal sinus rhythm at 61. He has no ST or T wave changes.  Assessment / Plan:

## 2013-01-27 ENCOUNTER — Encounter: Payer: Self-pay | Admitting: Cardiovascular Disease

## 2013-03-28 ENCOUNTER — Other Ambulatory Visit: Payer: Self-pay | Admitting: Family Medicine

## 2013-03-28 DIAGNOSIS — M545 Low back pain, unspecified: Secondary | ICD-10-CM

## 2013-04-03 ENCOUNTER — Ambulatory Visit
Admission: RE | Admit: 2013-04-03 | Discharge: 2013-04-03 | Disposition: A | Discharge status: Home / Self Care | Payer: Federal, State, Local not specified - PPO | Source: Ambulatory Visit | Attending: Family Medicine | Admitting: Family Medicine

## 2013-04-03 DIAGNOSIS — M545 Low back pain, unspecified: Secondary | ICD-10-CM

## 2013-06-12 ENCOUNTER — Ambulatory Visit (INDEPENDENT_AMBULATORY_CARE_PROVIDER_SITE_OTHER): Payer: Federal, State, Local not specified - PPO | Admitting: Cardiovascular Disease

## 2013-06-12 ENCOUNTER — Telehealth: Payer: Self-pay | Admitting: Cardiovascular Disease

## 2013-06-12 ENCOUNTER — Encounter (INDEPENDENT_AMBULATORY_CARE_PROVIDER_SITE_OTHER): Payer: Self-pay

## 2013-06-12 ENCOUNTER — Encounter: Payer: Self-pay | Admitting: Cardiovascular Disease

## 2013-06-12 ENCOUNTER — Other Ambulatory Visit: Payer: Self-pay

## 2013-06-12 VITALS — BP 158/90 | HR 56 | Ht 72.0 in | Wt 195.8 lb

## 2013-06-12 DIAGNOSIS — Z23 Encounter for immunization: Secondary | ICD-10-CM

## 2013-06-12 DIAGNOSIS — I10 Essential (primary) hypertension: Secondary | ICD-10-CM

## 2013-06-12 DIAGNOSIS — I2510 Atherosclerotic heart disease of native coronary artery without angina pectoris: Secondary | ICD-10-CM

## 2013-06-12 LAB — HEPATIC FUNCTION PANEL
ALT: 18 U/L (ref 0–53)
AST: 26 U/L (ref 0–37)
Albumin: 4.2 g/dL (ref 3.5–5.2)
Alkaline Phosphatase: 43 U/L (ref 39–117)
Bilirubin, Direct: 0 mg/dL (ref 0.0–0.3)
Total Bilirubin: 0.8 mg/dL (ref 0.3–1.2)
Total Protein: 7 g/dL (ref 6.0–8.3)

## 2013-06-12 LAB — BASIC METABOLIC PANEL
BUN: 12 mg/dL (ref 6–23)
CO2: 30 mEq/L (ref 19–32)
Calcium: 9.3 mg/dL (ref 8.4–10.5)
Chloride: 104 mEq/L (ref 96–112)
Creatinine, Ser: 0.9 mg/dL (ref 0.4–1.5)
GFR: 112.28 mL/min (ref >60.00)
Glucose, Bld: 86 mg/dL (ref 70–99)
Potassium: 3.9 mEq/L (ref 3.5–5.1)
Sodium: 139 mEq/L (ref 135–145)

## 2013-06-12 LAB — LIPID PANEL
Cholesterol: 143 mg/dL (ref 0–200)
HDL: 40.3 mg/dL (ref >39.00)
LDL Cholesterol: 83 mg/dL (ref 0–99)
Total CHOL/HDL Ratio: 4
Triglycerides: 97 mg/dL (ref 0.0–149.0)
VLDL: 19.4 mg/dL (ref 0.0–40.0)

## 2013-06-12 MED ORDER — NITROGLYCERIN 0.4 MG SL SUBL: SUBLINGUAL_TABLET | SUBLINGUAL | Status: DC

## 2013-06-12 MED ORDER — LISINOPRIL 20 MG PO TABS: 20.0000 mg | ORAL_TABLET | Freq: Every day | ORAL | Status: DC

## 2013-06-12 NOTE — Progress Notes (Signed)
Edward [name] Date of Birth  [birth year]       [facility]    [place] [address]  [address] [place], [place]  [address]   [place], [place]  [address] [phone]     [phone]   Fax  [phone]    Fax [phone]  Problem List: Coronary artery disease-status post inferior wall myocardial infarction-status post PTCA and stenting of his right coronary artery 2. Dyslipidemia 3. Hypertension 4. Hypokalemia 5. Recent fall with subsequent neck fracture  History of Present Illness:  Edward [name] is a 62 year old gentleman. Who was recently hospitalized for a neck fracture after he had a syncopal episode.  It has been to the bathroom. He's not had any subsequent episodes of syncope.  He's continued to have some episodes of dizziness. Not had any episodes of chest pain or shortness breath.  November 18, 2012: Edward [name] is doing fairly well from a cardiac standpoint. He has not had any episodes of chest pain or shortness breath. He is still recovering from his neck fracture in August of 2013. He still has some residual hand tingling and numbness.  He also has some numbness in his feet.  He also has developed a peripheral neuropathy.  Jun 12, 2013:  Edward [name] is doing OK.  He has been found to have a ruptured disc that affected his gait - this ultimately is what caused his fall last year.  No further burining in feet.  Still rehabing  . He is back at work. No CP, no cardiac    Current Outpatient Prescriptions on File Prior to Visit  Medication Sig Dispense Refill  . amLODipine (NORVASC) 5 MG tablet Take 5 mg by mouth daily.      . ANDROGEL PUMP 20.25 MG/ACT (1.62%) GEL Place 1 application onto the skin Daily.      Marland Kitchen aspirin 81 MG tablet Take 81 mg by mouth daily.      . fish oil-omega-3 fatty acids 1000 MG capsule Take 3 g by mouth daily. Take three tablets by mouth once daily      . gabapentin (NEURONTIN) 100 MG capsule Take 200 mg by  mouth.       Marland Kitchen HYDROcodone-acetaminophen (NORCO) 10-325 MG per tablet Take 1/2 tablet as needed      . ibuprofen (ADVIL,MOTRIN) 600 MG tablet Take 600 mg by mouth every 6 (six) hours as needed for pain.      Marland Kitchen lisinopril (PRINIVIL,ZESTRIL) 10 MG tablet Take 1 tablet (10 mg total) by mouth daily.  30 tablet  1  . metoprolol succinate (TOPROL-XL) 50 MG 24 hr tablet Take 50 mg by mouth daily. Take with or immediately following a meal.      . Multiple Vitamin (MULTIVITAMIN) tablet Take 1 tablet by mouth daily.      . Polyethylene Glycol 3350 (MIRALAX PO) Take by mouth as needed.      . simvastatin (ZOCOR) 80 MG tablet Take 80 mg by mouth at bedtime.      . Cholecalciferol (VITAMIN D PO) Take by mouth daily.       No current facility-administered medications on file prior to visit.    No Known Allergies  Past Medical History  Diagnosis Date  . Measles   . Mumps   . Chicken pox   . Whooping cough   . Pneumonia   . Hypertension   . Coronary artery disease     a. 1999 s/p MI with cath/PCI;  b. 05/1999 Ex Cardiolite EF 68%, no ishcemia.  . Dyslipidemia   . Hypokalemia   . History of tobacco abuse   . Near syncope   . Nasal bones, closed fracture     a. 02/2012 in setting of presyncope/fall  . Injury of cervical spine     a. 02/2012 C4/5    Past Surgical History  Procedure Laterality Date  . Petalla tendon surgery  1989  . Achilles tendon surgery  1989  . Cardiac catheterization  06/28/1998    single vessel CAD involving the distal RCA/PTCA and stenting of the distal RCA//EF- 50-55%  . Nm myoview ltd  05/17/2009    normal stress nuclear study/no evidence of ischemia/EF- 68%  . Anterior cervical decomp/discectomy fusion  03/13/2012    Procedure: ANTERIOR CERVICAL DECOMPRESSION/DISCECTOMY FUSION 1 LEVEL;  Surgeon: Maeola Harman, MD;  Location: [facility];  Service: Neurosurgery;  Laterality: N/A;  Anterior Cervical Decompression/Fusion. Cervical four-five.  . Closed reduction nasal  fracture  03/13/2012    Procedure: CLOSED REDUCTION NASAL FRACTURE;  Surgeon: Wayland Denis, DO;  Location: [facility];  Service: Plastics;  Laterality: N/A;  Internal and external splinting of nasal fracture    History  Smoking status  . Former Smoker -- 1.00 packs/day for 20 years  . Types: Cigarettes  . Quit date: 11/02/1986  Smokeless tobacco  . Not on file    History  Alcohol Use  . Yes    Comment: occasional alcoholic beverage.    Family History  Problem Relation Age of Onset  . Heart attack Father     74  . Cancer Mother     50    Reviw of Systems:  Reviewed in the HPI.  All other systems are negative.  Physical Exam: Blood pressure 158/90, pulse 56, height 6' (1.829 m), weight 195 lb 12.8 oz (88.814 kg). General: Well developed, well nourished, in no acute distress.  Head: Normocephalic, atraumatic, sclera non-icteric, mucus membranes are moist,   Neck: Supple. Carotids are 2 + without bruits. No JVD  Lungs: Clear bilaterally to auscultation.  Heart: regular rate.  normal  S1 S2. No murmurs, gallops or rubs.  Abdomen: Soft, non-tender, non-distended with normal bowel sounds. No hepatomegaly. No rebound/guarding. No masses.  Msk:  Strength and tone are normal  Extremities: No clubbing or cyanosis. No edema.  Distal pedal pulses are 2+ and equal bilaterally.  Neuro: Alert and oriented X 3. Moves all extremities spontaneously.  Psych:  Responds to questions appropriately with a normal affect.  ECG: November 18, 2012 normal sinus rhythm at 61. He has no ST or T wave changes.  Assessment / Plan:

## 2013-06-12 NOTE — Telephone Encounter (Signed)
WE ARE CHECKING A BMET/ PT INFORMED.

## 2013-06-12 NOTE — Assessment & Plan Note (Signed)
Patient is doing well.  No angina.  Will check fasting labs today.  Refill ntg

## 2013-06-12 NOTE — Patient Instructions (Signed)
Your physician recommends that you return for a FASTING lipid profile: today   Flu shot today ///  Your physician has recommended you make the following change in your medication:  INCREASE LISINOPRIL TO 20 MG DAILY FOR HIGH BLOOD PRESSURE, YOU CAN TAKE TWO OF THE 10 MG TABLETS THAT YOU STILL  HAVE IN THE BOTTLE AT HOME TILL GONE, THEN NEW BOTTLE WILL BE THE 20 MG AND YOU WILL RETURN TO ONE TABLET DAILY.  Your physician recommends that you return for lab work in: 1 MONTH/ DATE GIVEN    Your physician wants you to follow-up in:6 MONTHS  You will receive a reminder letter in the mail two months in advance. If you don't receive a letter, please call our office to schedule the follow-up appointment.

## 2013-06-12 NOTE — Telephone Encounter (Signed)
New message     Had labs drawn today---forgot to ask if we could check his potassium.  Is it too late to add it?

## 2013-06-12 NOTE — Assessment & Plan Note (Signed)
BP remains a bit high.  Will increase lisinopril to 20 a day.  Check bMP in 1 month

## 2013-07-14 ENCOUNTER — Other Ambulatory Visit: Payer: Federal, State, Local not specified - PPO

## 2013-09-08 ENCOUNTER — Encounter: Payer: Self-pay | Admitting: Cardiovascular Disease

## 2013-12-15 ENCOUNTER — Encounter: Payer: Self-pay | Admitting: Cardiovascular Disease

## 2013-12-15 ENCOUNTER — Ambulatory Visit (INDEPENDENT_AMBULATORY_CARE_PROVIDER_SITE_OTHER): Payer: Federal, State, Local not specified - PPO | Admitting: Cardiovascular Disease

## 2013-12-15 VITALS — BP 122/92 | HR 53 | Ht 72.0 in | Wt 198.0 lb

## 2013-12-15 DIAGNOSIS — I2510 Atherosclerotic heart disease of native coronary artery without angina pectoris: Secondary | ICD-10-CM

## 2013-12-15 DIAGNOSIS — I10 Essential (primary) hypertension: Secondary | ICD-10-CM

## 2013-12-15 DIAGNOSIS — E785 Hyperlipidemia, unspecified: Secondary | ICD-10-CM

## 2013-12-15 NOTE — Progress Notes (Signed)
[name] Date of Birth  02-10-1951       [facility] [address], [place], [place]  [address]   [place], [place]  [address] [phone]     [phone]   Fax  [phone]    Fax [phone]  Problem List: Coronary artery disease-status post inferior wall myocardial infarction-status post PTCA and stenting of his right coronary artery 2. Dyslipidemia 3. Hypertension 4. Hypokalemia 5. Recent fall with subsequent neck fracture  History of Present Illness:  Dimetri is a 63 year old gentleman. Who was recently hospitalized for a neck fracture after he had a syncopal episode.  It has been to the bathroom. He's not had any subsequent episodes of syncope.  He's continued to have some episodes of dizziness. Not had any episodes of chest pain or shortness breath.  November 18, 2012: Shiv is doing fairly well from a cardiac standpoint. He has not had any episodes of chest pain or shortness breath. He is still recovering from his neck fracture in August of 2013. He still has some residual hand tingling and numbness.  He also has some numbness in his feet.  He also has developed a peripheral neuropathy.  Jun 12, 2013:  Dyer is doing OK.  He has been found to have a ruptured disc that affected his gait - this ultimately is what caused his fall last year.  No further burining in feet.  Still rehabing  . He is back at work. No CP, no cardiac   Dec 15, 2013:  Iokepa is doing well.   He has cut out most of his salt.   BP is much better.   No CP.  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  Not as much exercise recently.     Current Outpatient Prescriptions on File Prior to Visit  Medication Sig Dispense Refill  . amLODipine (NORVASC) 5 MG tablet Take 5 mg by mouth daily.      . ANDROGEL PUMP 20.25 MG/ACT (1.62%) GEL Place 1 application onto the skin Daily. 2 APPLICATIONS EACH SHOULDER      . aspirin 81  MG tablet Take 81 mg by mouth daily.      . fish oil-omega-3 fatty acids 1000 MG capsule Take 3 g by mouth daily. Take three tablets by mouth once daily      . gabapentin (NEURONTIN) 100 MG capsule Take 600 mg by mouth.       Marland Kitchen HYDROcodone-acetaminophen (NORCO) 10-325 MG per tablet Take 1/2 tablet as needed      . ibuprofen (ADVIL,MOTRIN) 600 MG tablet Take 600 mg by mouth every 6 (six) hours as needed for pain.      Marland Kitchen lisinopril (PRINIVIL,ZESTRIL) 20 MG tablet Take 1 tablet (20 mg total) by mouth daily.  30 tablet  6  . metoprolol succinate (TOPROL-XL) 50 MG 24 hr tablet Take 50 mg by mouth daily. Take with or immediately following a meal.      . Multiple Vitamin (MULTIVITAMIN) tablet Take 1 tablet by mouth daily.      . nitroGLYCERIN (NITROSTAT) 0.4 MG SL tablet place 1 tablet under the tongue if needed for chest pain  25 tablet  3  . Polyethylene Glycol 3350 (MIRALAX PO) Take by mouth as needed.      . simvastatin (ZOCOR) 80 MG tablet Take 80 mg by mouth at bedtime.       No  current facility-administered medications on file prior to visit.    No Known Allergies  Past Medical History  Diagnosis Date  . Measles   . Mumps   . Chicken pox   . Whooping cough   . Pneumonia   . Hypertension   . Coronary artery disease     a. 1999 s/p MI with cath/PCI;  b. 05/1999 Ex Cardiolite EF 68%, no ishcemia.  . Dyslipidemia   . Hypokalemia   . History of tobacco abuse   . Near syncope   . Nasal bones, closed fracture     a. 02/2012 in setting of presyncope/fall  . Injury of cervical spine     a. 02/2012 C4/5    Past Surgical History  Procedure Laterality Date  . Petalla tendon surgery  1989  . Achilles tendon surgery  1989  . Cardiac catheterization  06/28/1998    single vessel CAD involving the distal RCA/PTCA and stenting of the distal RCA//EF- 50-55%  . Nm myoview ltd  05/17/2009    normal stress nuclear study/no evidence of ischemia/EF- 68%  . Anterior cervical decomp/discectomy  fusion  03/13/2012    Procedure: ANTERIOR CERVICAL DECOMPRESSION/DISCECTOMY FUSION 1 LEVEL;  Surgeon: Erline Levine, MD;  Location: [facility];  Service: Neurosurgery;  Laterality: N/A;  Anterior Cervical Decompression/Fusion. Cervical four-five.  . Closed reduction nasal fracture  03/13/2012    Procedure: CLOSED REDUCTION NASAL FRACTURE;  Surgeon: Theodoro Kos, DO;  Location: [facility];  Service: Plastics;  Laterality: N/A;  Internal and external splinting of nasal fracture    History  Smoking status  . Former Smoker -- 1.00 packs/day for 20 years  . Types: Cigarettes  . Quit date: 11/02/1986  Smokeless tobacco  . Not on file    History  Alcohol Use  . Yes    Comment: occasional alcoholic beverage.    Family History  Problem Relation Age of Onset  . Heart attack Father     49  . Cancer Mother     78    Reviw of Systems:  Reviewed in the HPI.  All other systems are negative.  Physical Exam: Blood pressure 122/92, pulse 53, height 6' (1.829 m), weight 198 lb (89.812 kg). General: Well developed, well nourished, in no acute distress Head: Normocephalic, atraumatic, sclera non-icteric, mucus membranes are moist,  Neck: Supple. Carotids are 2 + without bruits. No JVD Lungs: Clear bilaterally to auscultation. Heart: regular rate.  normal  S1 S2. No murmurs, gallops or rubs. Abdomen: Soft, non-tender, non-distended with normal bowel sounds. No hepatomegaly. No rebound/guarding. No masses. Msk:  Strength and tone are normal Extremities: No clubbing or cyanosis. No edema.  Distal pedal pulses are 2+ and equal bilaterally. Neuro: Alert and oriented X 3. Moves all extremities spontaneously. Psych:  Responds to questions appropriately with a normal affect.  ECG: Dec 15, 2013:  Sinus brady at 41.   Assessment / Plan:

## 2013-12-15 NOTE — Assessment & Plan Note (Signed)
Edward [name] is doing well. He's not had any episodes of chest pain or shortness breath. 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  His lipids have been well controlled. I'll see him again in 6 months. We'll check fasting lipid profile, liver enzymes, and basic metabolic profile at that time.

## 2013-12-15 NOTE — Assessment & Plan Note (Signed)
She's been watching his salt in his blood pressure is much better. Continue with same medications.

## 2013-12-15 NOTE — Patient Instructions (Signed)
Your physician recommends that you continue on your current medications as directed. Please refer to the Current Medication list given to you today.  Your physician wants you to follow-up in: 6 months with Dr. Acie Fredrickson. You will receive a reminder letter in the mail two months in advance. If you don't receive a letter, please call our office to schedule the follow-up appointment.  Your physician recommends that you return for lab work in: 6 months on or before your office visit with Dr. Acie Fredrickson. You will need to fast for this appointment - do not eat or drink after midnight the night before except water.

## 2014-03-10 ENCOUNTER — Other Ambulatory Visit: Payer: Self-pay

## 2014-03-10 MED ORDER — LISINOPRIL 20 MG PO TABS: 20.0000 mg | ORAL_TABLET | Freq: Every day | ORAL | Status: DC

## 2014-07-01 ENCOUNTER — Ambulatory Visit (INDEPENDENT_AMBULATORY_CARE_PROVIDER_SITE_OTHER): Payer: Federal, State, Local not specified - PPO | Admitting: Cardiovascular Disease

## 2014-07-01 ENCOUNTER — Ambulatory Visit (INDEPENDENT_AMBULATORY_CARE_PROVIDER_SITE_OTHER): Payer: Federal, State, Local not specified - PPO

## 2014-07-01 ENCOUNTER — Encounter: Payer: Self-pay | Admitting: Cardiovascular Disease

## 2014-07-01 VITALS — BP 130/90 | HR 60 | Ht 72.0 in | Wt 201.0 lb

## 2014-07-01 DIAGNOSIS — I10 Essential (primary) hypertension: Secondary | ICD-10-CM

## 2014-07-01 DIAGNOSIS — E785 Hyperlipidemia, unspecified: Secondary | ICD-10-CM

## 2014-07-01 DIAGNOSIS — I2510 Atherosclerotic heart disease of native coronary artery without angina pectoris: Secondary | ICD-10-CM

## 2014-07-01 DIAGNOSIS — Z23 Encounter for immunization: Secondary | ICD-10-CM

## 2014-07-01 MED ORDER — NITROGLYCERIN 0.4 MG SL SUBL: SUBLINGUAL_TABLET | SUBLINGUAL | Status: DC

## 2014-07-01 NOTE — Patient Instructions (Addendum)
Your physician recommends that you continue on your current medications as directed. Please refer to the Current Medication list given to you today.  Your physician recommends that you have lab work:  TODAY - Cholesterol, Liver, BMET   Your physician wants you to follow-up in: 6 months with Dr. Acie Fredrickson.  You will receive a reminder letter in the mail two months in advance. If you don't receive a letter, please call our office to schedule the follow-up appointment.

## 2014-07-01 NOTE — Assessment & Plan Note (Signed)
Dutch is doing well.  He has not had any angina pain Will check labs today.   Encouraged him to walk on a regular basis.

## 2014-07-01 NOTE — Progress Notes (Signed)
Edward [name] Date of Birth  1950-08-27       Edward [name] [name], [name], [name]  [address]   [name],   [address] [phone]     [phone]   Fax  [phone]    Fax [phone]  Problem List: Coronary artery disease-status post inferior wall myocardial infarction-status post PTCA and stenting of his right coronary artery 2. Dyslipidemia 3. Hypertension 4. Hypokalemia 5. Recent fall with subsequent neck fracture  History of Present Illness:  Edward [name] is a 63 year old gentleman. Who was recently hospitalized for a neck fracture after he had a syncopal episode.  It has been to the bathroom. He's not had any subsequent episodes of syncope.  He's continued to have some episodes of dizziness. Not had any episodes of chest pain or shortness breath.  November 18, 2012: Edward [name] is doing fairly well from a cardiac standpoint. He has not had any episodes of chest pain or shortness breath. He is still recovering from his neck fracture in August of 2013. He still has some residual hand tingling and numbness.  He also has some numbness in his feet.  He also has developed a peripheral neuropathy.  Jun 12, 2013:  Edward [name] is doing OK.  He has been found to have a ruptured disc that affected his gait - this ultimately is what caused his fall last year.  No further burining in feet.  Still rehabing  . He is back at work. No CP, no cardiac   Dec 15, 2013:  Edward [name] is doing well.   He has cut out most of his salt.   BP is much better.   No CP.  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  Not as much exercise recently.   Dec. 2, 2015:  Edward [name] is doing well.  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  Edward [name] had a mild chest pain - did not call or take NTG.  May have been indigestion.   He has improved his diet.  He needs fasting labs today He has requested a flu shot.    Current  Outpatient Prescriptions on File Prior to Visit  Medication Sig Dispense Refill  . amLODipine (NORVASC) 5 MG tablet Take 5 mg by mouth daily.    . ANDROGEL PUMP 20.25 MG/ACT (1.62%) GEL Place 1 application onto the skin Daily. 2 APPLICATIONS EACH SHOULDER    . aspirin 81 MG tablet Take 81 mg by mouth daily.    . fish oil-omega-3 fatty acids 1000 MG capsule Take 3 g by mouth daily. Take three tablets by mouth once daily    . HYDROcodone-acetaminophen (NORCO) 10-325 MG per tablet Take 1/2 tablet as needed    . ibuprofen (ADVIL,MOTRIN) 600 MG tablet Take 600 mg by mouth every 6 (six) hours as needed for pain.    Marland Kitchen lisinopril (PRINIVIL,ZESTRIL) 20 MG tablet Take 1 tablet (20 mg total) by mouth daily. 90 tablet 1  . metoprolol succinate (TOPROL-XL) 50 MG 24 hr tablet Take 50 mg by mouth daily. Take with or immediately following a meal.    . Multiple Vitamin (MULTIVITAMIN) tablet Take 1 tablet by mouth daily.    . nitroGLYCERIN (NITROSTAT) 0.4 MG SL tablet place 1 tablet under the tongue if needed for chest pain 25 tablet 3  . Polyethylene Glycol 3350 (MIRALAX PO) Take by mouth as needed.    Marland Kitchen  simvastatin (ZOCOR) 80 MG tablet Take 80 mg by mouth at bedtime.    Marland Kitchen VIAGRA 100 MG tablet Take 100 mg by mouth as needed.      No current facility-administered medications on file prior to visit.    No Known Allergies  Past Medical History  Diagnosis Date  . Measles   . Mumps   . Chicken pox   . Whooping cough   . Pneumonia   . Hypertension   . Coronary artery disease     a. 1999 s/p MI with cath/PCI;  b. 05/1999 Ex Cardiolite EF 68%, no ishcemia.  . Dyslipidemia   . Hypokalemia   . History of tobacco abuse   . Near syncope   . Nasal bones, closed fracture     a. 02/2012 in setting of presyncope/fall  . Injury of cervical spine     a. 02/2012 C4/5    Past Surgical History  Procedure Laterality Date  . Petalla tendon surgery  1989  . Achilles tendon surgery  1989  . Cardiac catheterization   06/28/1998    single vessel CAD involving the distal RCA/PTCA and stenting of the distal RCA//EF- 50-55%  . Nm myoview ltd  05/17/2009    normal stress nuclear study/no evidence of ischemia/EF- 68%  . Anterior cervical decomp/discectomy fusion  03/13/2012    Procedure: ANTERIOR CERVICAL DECOMPRESSION/DISCECTOMY FUSION 1 LEVEL;  Surgeon: Erline Levine, MD;  Location: [facility];  Service: Neurosurgery;  Laterality: N/A;  Anterior Cervical Decompression/Fusion. Cervical four-five.  . Closed reduction nasal fracture  03/13/2012    Procedure: CLOSED REDUCTION NASAL FRACTURE;  Surgeon: Theodoro Kos, DO;  Location: [facility];  Service: Plastics;  Laterality: N/A;  Internal and external splinting of nasal fracture    History  Smoking status  . Former Smoker -- 1.00 packs/day for 20 years  . Types: Cigarettes  . Quit date: 11/02/1986  Smokeless tobacco  . Not on file    History  Alcohol Use  . Yes    Comment: occasional alcoholic beverage.    Family History  Problem Relation Age of Onset  . Heart attack Father     57  . Cancer Mother     64    Reviw of Systems:  Reviewed in the HPI.  All other systems are negative.  Physical Exam: Blood pressure 130/90, pulse 60, height 6' (1.829 m), weight 201 lb (91.173 kg). General: Well developed, well nourished, in no acute distress Head: Normocephalic, atraumatic, sclera non-icteric, mucus membranes are moist,  Neck: Supple. Carotids are 2 + without bruits. No JVD Lungs: Clear bilaterally to auscultation. Heart: regular rate.  normal  S1 S2. No murmurs, gallops or rubs. Abdomen: Soft, non-tender, non-distended with normal bowel sounds. No hepatomegaly. No rebound/guarding. No masses. Msk:  Strength and tone are normal Extremities: No clubbing or cyanosis. No edema.  Distal pedal pulses are 2+ and equal bilaterally. Neuro: Alert and oriented X 3. Moves all extremities spontaneously. Psych:  Responds to questions appropriately with a  normal affect.  ECG:  Assessment / Plan:

## 2014-07-01 NOTE — Assessment & Plan Note (Signed)
BP is well controlled 

## 2014-07-02 LAB — HEPATIC FUNCTION PANEL
ALT: 26 U/L (ref 0–53)
AST: 29 U/L (ref 0–37)
Albumin: 4.4 g/dL (ref 3.5–5.2)
Alkaline Phosphatase: 45 U/L (ref 39–117)
Bilirubin, Direct: 0 mg/dL (ref 0.0–0.3)
Total Bilirubin: 0.7 mg/dL (ref 0.2–1.2)
Total Protein: 6.8 g/dL (ref 6.0–8.3)

## 2014-07-02 LAB — BASIC METABOLIC PANEL
BUN: 16 mg/dL (ref 6–23)
CO2: 25 mEq/L (ref 19–32)
Calcium: 9.3 mg/dL (ref 8.4–10.5)
Chloride: 105 mEq/L (ref 96–112)
Creatinine, Ser: 1.1 mg/dL (ref 0.4–1.5)
GFR: 89.31 mL/min (ref >60.00)
Glucose, Bld: 86 mg/dL (ref 70–99)
Potassium: 3.8 mEq/L (ref 3.5–5.1)
Sodium: 136 mEq/L (ref 135–145)

## 2014-07-02 LAB — LIPID PANEL
Cholesterol: 168 mg/dL (ref 0–200)
HDL: 29.4 mg/dL — ABNORMAL LOW (ref >39.00)
NonHDL: 138.6
Total CHOL/HDL Ratio: 6
Triglycerides: 242 mg/dL — ABNORMAL HIGH (ref 0.0–149.0)
VLDL: 48.4 mg/dL — ABNORMAL HIGH (ref 0.0–40.0)

## 2014-07-02 LAB — LDL CHOLESTEROL, DIRECT: Direct LDL: 81.6 mg/dL

## 2014-07-06 ENCOUNTER — Telehealth: Payer: Self-pay | Admitting: Cardiovascular Disease

## 2014-07-06 NOTE — Telephone Encounter (Signed)
New Message        Pt calling stating someone called him today about his lab results and he is returning the phone call. Please call back and advise.

## 2014-07-06 NOTE — Telephone Encounter (Signed)
Spoke with patient and reviewed lab results and plan of care.  Patient states he has been eating more sugar recently, particularly ice cream, and he would like to work on better diet before switching medications.  Patient states Simvastatin has been working well for him for many years.  We discussed dietary changes that will improve high triglyceride levels.  Patient requests to ask Dr. Acie Fredrickson if he can continue Simvastatin and work on [employer].  I advised patient that we will recheck fasting labs at next office visit and advised that he can have lab work completed prior to office visit so that Dr. Acie Fredrickson has the results when he comes in.  Patient verbalized understanding and agreement.

## 2014-07-07 NOTE — Telephone Encounter (Signed)
He may work on improving his diet. Continue simva for now. Recheck labs in 6 months

## 2014-07-13 IMAGING — CT CT HEAD W/O CM
4 of 6 series · 16 of 47 positions shown, 18 images · non-contrast
Comparison: None.

CT HEAD

CLINICAL DATA: Fall, head injury, nose laceration, history of
seizures

CT HEAD WITHOUT CONTRAST
CT MAXILLOFACIAL WITHOUT CONTRAST
CT CERVICAL SPINE WITHOUT CONTRAST
TECHNIQUE: Multidetector CT imaging of the head, cervical spine,
and maxillofacial structures were performed using the standard
protocol without intravenous contrast. Multiplanar CT image
reconstructions of the cervical spine and maxillofacial structures
were also generated.

[Series 2: head w/o · axial · non-contrast · 0.46mm/px · z∈[+275,+330]mm · 2 of 35 slices shown]
[im 12/35  brain]
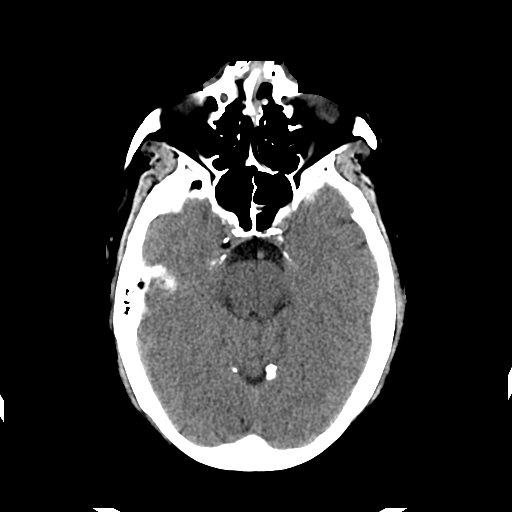
[im 23/35  brain]
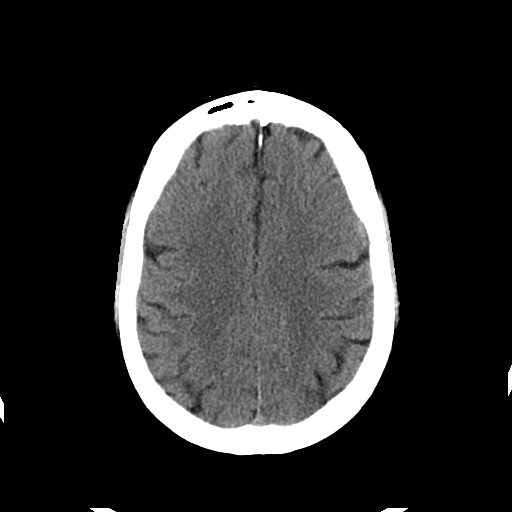

[Series 5: soft tissue · axial · 0.28mm/px · z∈[+66,+248]mm · 8 of 118 slices shown, 10 images]
[im 14/118  brain]
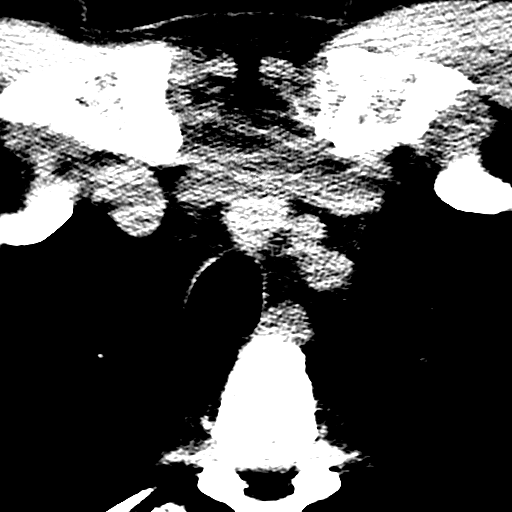
[im 14/118  bone]
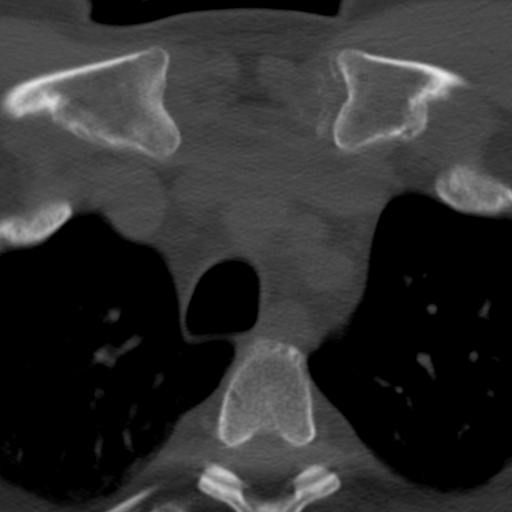
[im 27/118  brain]
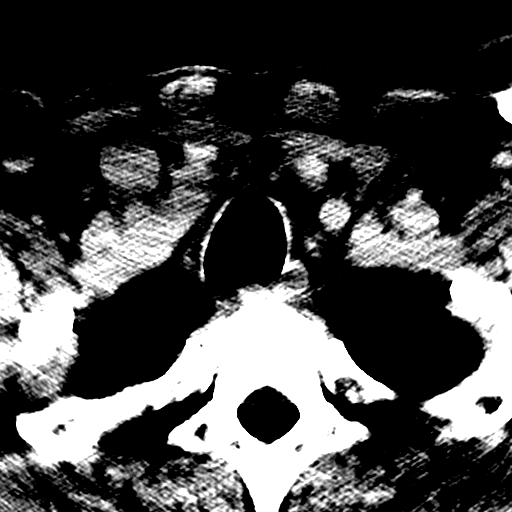
[im 40/118  brain]
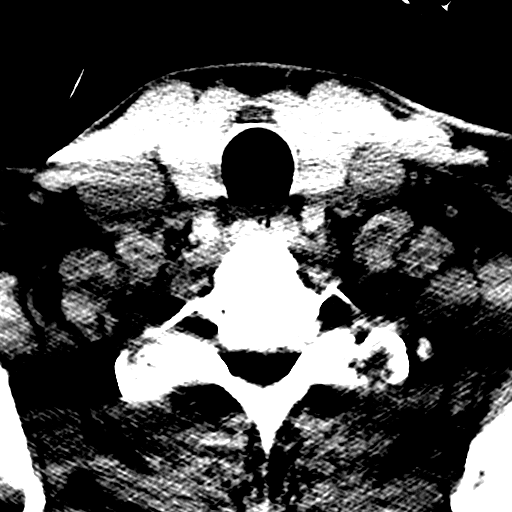
[im 53/118  brain]
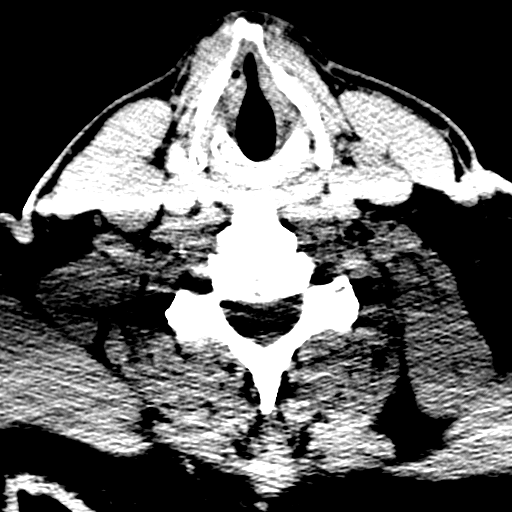
[im 66/118  brain]
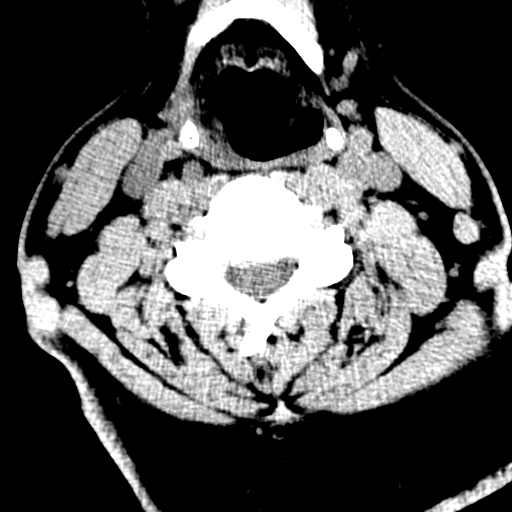
[im 66/118  bone]
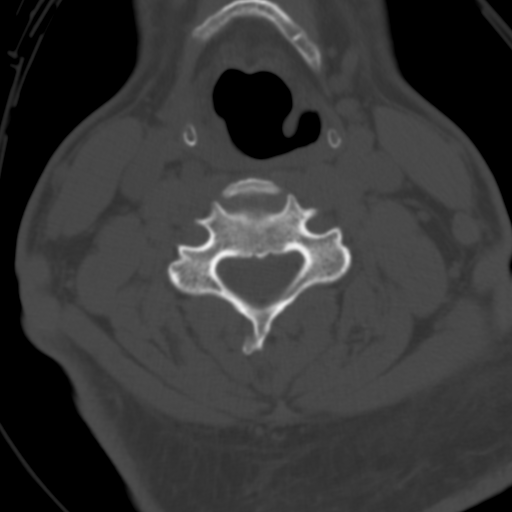
[im 79/118  brain]
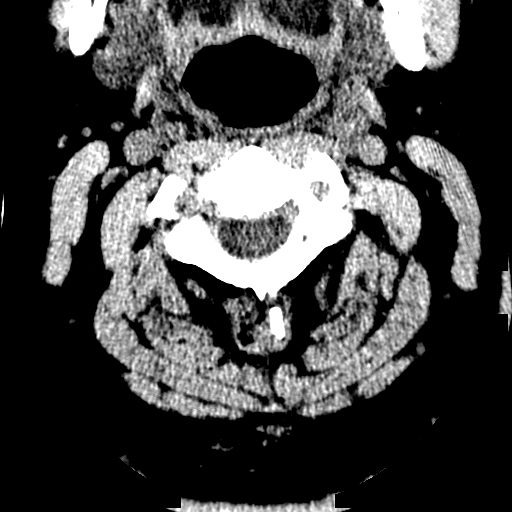
[im 92/118  brain]
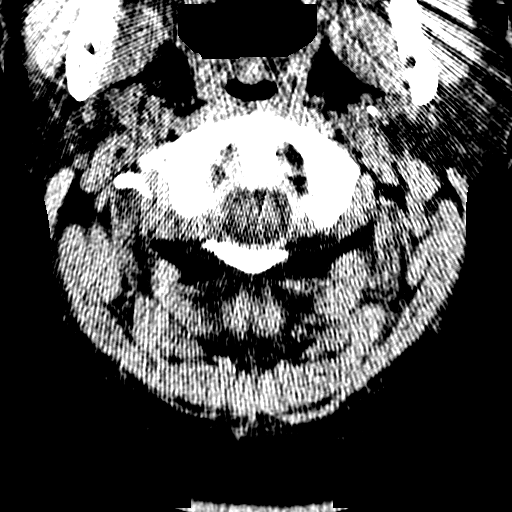
[im 105/118  brain]
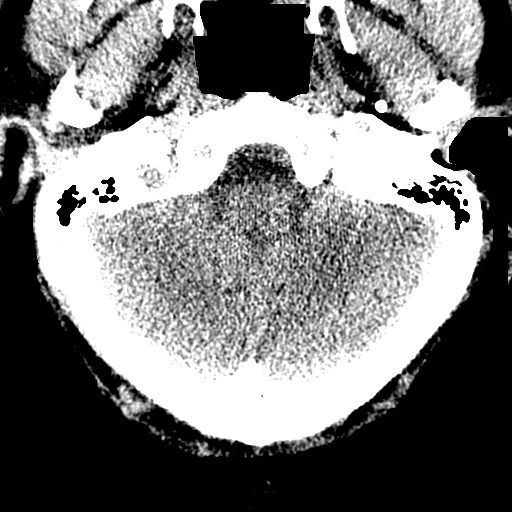

[sagittal c-spine · sagittal · 0.46mm/px · 3 of 45 slices shown]
[im 15/45  brain]
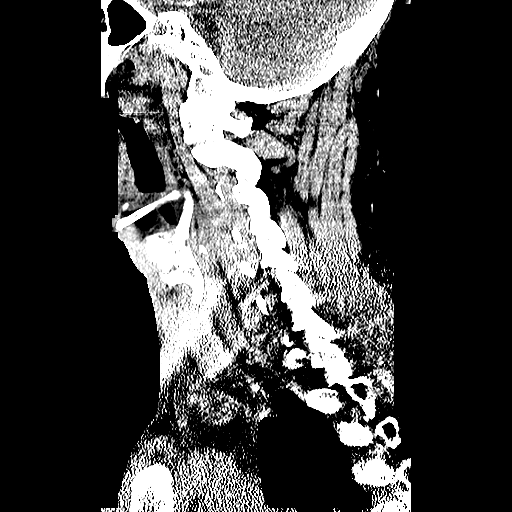
[im 23/45  brain]
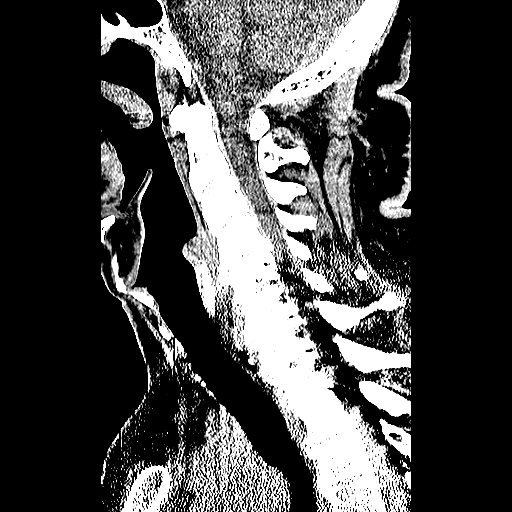
[im 30/45  brain]
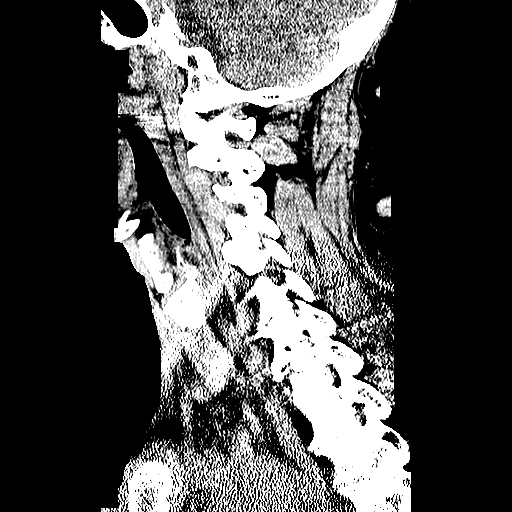

[coronal c-spine · coronal · 0.46mm/px · 3 of 41 slices shown]
[im 14/41  brain]
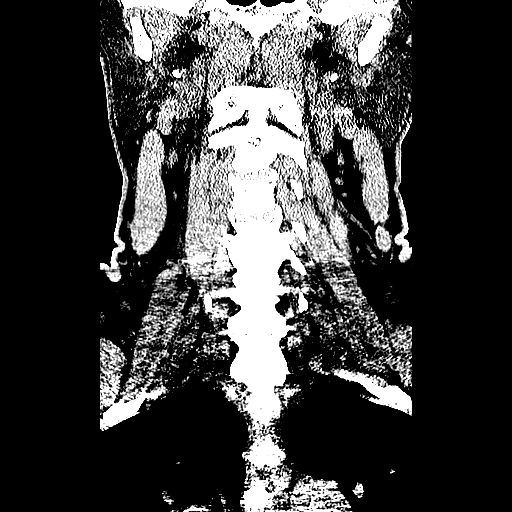
[im 18/41  brain]
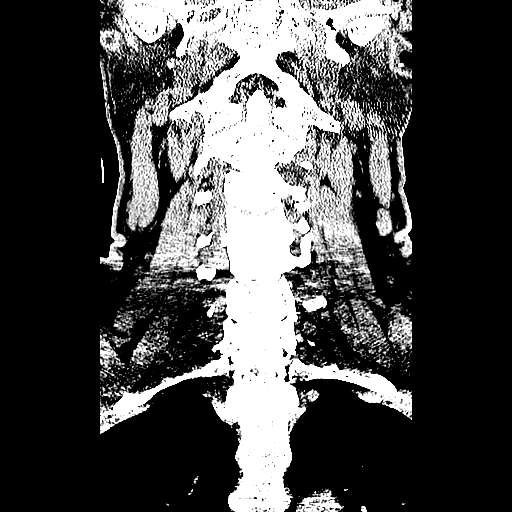
[im 23/41  brain]
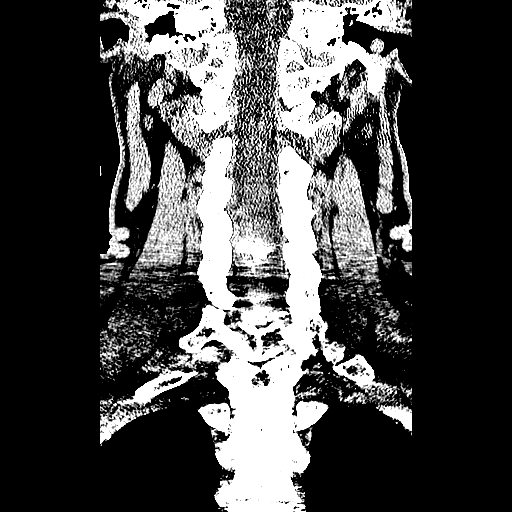

[16 of 47 positions shown; findings below may reference images not displayed]

FINDINGS: No evidence of parenchymal hemorrhage or extra-axial
fluid collection. No mass lesion, mass effect, or midline shift.

No CT evidence of acute infarction.

Subcortical white matter and periventricular small vessel ischemic
changes.

Cerebral volume is age appropriate.  No ventriculomegaly.

The visualized paranasal sinuses are essentially clear. The mastoid
air cells are unopacified.

No evidence of calvarial fracture.
IMPRESSION: No evidence of acute intracranial abnormality.

Small vessel ischemic changes.

See below for maxillofacial findings.

CT MAXILLOFACIAL
FINDINGS: Comminuted, depressed bilateral nasal bone fractures.
Additional fractures of the anterior/mid nasal septum, which bows
to the right.

Overlying soft tissue swelling/laceration of the nasal bridge.

Small amount of layering fluid/hemorrhage in the left sphenoid
sinus (series 4/image 59).  The visualized paranasal sinuses are
otherwise clear.  The mastoid air cells are unopacified.

The bilateral orbits, including the retroconal soft tissues, are
within normal limits.

Small bilateral cervical lymph nodes.  6 mm intraparotid lymph node
on the left (series 3/image 38).
IMPRESSION: Comminuted, depressed bilateral nasal bone fractures.

Additional fractures of the anterior/mid nasal septum, which bows
to the right.

Overlying soft tissue swelling/laceration of the nasal bridge.

CT CERVICAL SPINE
FINDINGS: Straightening of the cervical spine, positive
positional.

No evidence of fracture or dislocation.  Vertebral body heights are
maintained.  The dens appears intact.

No prevertebral soft tissue swelling.

Moderate multilevel degenerative changes.

Visualized thyroid is unremarkable.

Visualized lung apices are clear.
IMPRESSION: No evidence of traumatic injury to the cervical spine.

Moderate multilevel degenerative changes.

## 2014-09-21 ENCOUNTER — Encounter: Payer: Self-pay | Admitting: Cardiovascular Disease

## 2014-10-09 ENCOUNTER — Telehealth: Payer: Self-pay | Admitting: Cardiovascular Disease

## 2014-10-09 NOTE — Telephone Encounter (Signed)
New Msg        Pt has had labs completed at PCP and they will send results over to office.  Please contact pt if any concerns.

## 2014-10-09 NOTE — Telephone Encounter (Signed)
Will forward to Dr. Nahser. °

## 2015-04-30 ENCOUNTER — Encounter: Payer: Self-pay | Admitting: Cardiovascular Disease

## 2015-11-01 DIAGNOSIS — I2510 Atherosclerotic heart disease of native coronary artery without angina pectoris: Secondary | ICD-10-CM | POA: Diagnosis not present

## 2015-11-01 DIAGNOSIS — E7800 Pure hypercholesterolemia, unspecified: Secondary | ICD-10-CM | POA: Diagnosis not present

## 2015-11-01 DIAGNOSIS — I10 Essential (primary) hypertension: Secondary | ICD-10-CM | POA: Diagnosis not present

## 2015-12-08 DIAGNOSIS — M542 Cervicalgia: Secondary | ICD-10-CM | POA: Diagnosis not present

## 2015-12-08 DIAGNOSIS — S14129D Central cord syndrome at unspecified level of cervical spinal cord, subsequent encounter: Secondary | ICD-10-CM | POA: Diagnosis not present

## 2015-12-08 DIAGNOSIS — M545 Low back pain: Secondary | ICD-10-CM | POA: Diagnosis not present

## 2015-12-08 DIAGNOSIS — Z6826 Body mass index (BMI) 26.0-26.9, adult: Secondary | ICD-10-CM | POA: Diagnosis not present

## 2016-01-14 DIAGNOSIS — M150 Primary generalized (osteo)arthritis: Secondary | ICD-10-CM | POA: Diagnosis not present

## 2016-01-14 DIAGNOSIS — I7300 Raynaud's syndrome without gangrene: Secondary | ICD-10-CM | POA: Diagnosis not present

## 2016-01-14 DIAGNOSIS — M3500 Sicca syndrome, unspecified: Secondary | ICD-10-CM | POA: Diagnosis not present

## 2016-01-14 DIAGNOSIS — M2550 Pain in unspecified joint: Secondary | ICD-10-CM | POA: Diagnosis not present

## 2016-05-25 DIAGNOSIS — I10 Essential (primary) hypertension: Secondary | ICD-10-CM | POA: Diagnosis not present

## 2016-05-25 DIAGNOSIS — Z23 Encounter for immunization: Secondary | ICD-10-CM | POA: Diagnosis not present

## 2016-05-25 DIAGNOSIS — E7800 Pure hypercholesterolemia, unspecified: Secondary | ICD-10-CM | POA: Diagnosis not present

## 2016-05-25 DIAGNOSIS — I2510 Atherosclerotic heart disease of native coronary artery without angina pectoris: Secondary | ICD-10-CM | POA: Diagnosis not present

## 2016-06-01 ENCOUNTER — Encounter: Payer: Self-pay | Admitting: Cardiovascular Disease

## 2016-06-01 DIAGNOSIS — I10 Essential (primary) hypertension: Secondary | ICD-10-CM | POA: Diagnosis not present

## 2016-06-01 DIAGNOSIS — E7800 Pure hypercholesterolemia, unspecified: Secondary | ICD-10-CM | POA: Diagnosis not present

## 2016-06-27 ENCOUNTER — Encounter: Payer: Self-pay | Admitting: Cardiovascular Disease

## 2016-07-05 ENCOUNTER — Encounter: Payer: Self-pay | Admitting: Cardiovascular Disease

## 2016-07-05 ENCOUNTER — Other Ambulatory Visit: Payer: Self-pay | Admitting: Cardiovascular Disease

## 2016-07-05 ENCOUNTER — Ambulatory Visit (INDEPENDENT_AMBULATORY_CARE_PROVIDER_SITE_OTHER): Payer: Federal, State, Local not specified - PPO | Admitting: Cardiovascular Disease

## 2016-07-05 VITALS — BP 140/90 | HR 53 | Ht 72.0 in | Wt 208.4 lb

## 2016-07-05 DIAGNOSIS — I2510 Atherosclerotic heart disease of native coronary artery without angina pectoris: Secondary | ICD-10-CM | POA: Diagnosis not present

## 2016-07-05 DIAGNOSIS — I10 Essential (primary) hypertension: Secondary | ICD-10-CM | POA: Diagnosis not present

## 2016-07-05 MED ORDER — NITROGLYCERIN 0.4 MG SL SUBL
SUBLINGUAL_TABLET | SUBLINGUAL | 6 refills | Status: DC
Start: 2016-07-05 — End: 2018-04-03

## 2016-07-05 NOTE — Patient Instructions (Signed)
Medication Instructions:  Your physician recommends that you continue on your current medications as directed. Please refer to the Current Medication list given to you today.   Labwork: None Ordered   Testing/Procedures: None Ordered   Follow-Up: Your physician wants you to follow-up in: 1 year with Dr. Nahser.  You will receive a reminder letter in the mail two months in advance. If you don't receive a letter, please call our office to schedule the follow-up appointment.   If you need a refill on your cardiac medications before your next appointment, please call your pharmacy.   Thank you for choosing [facility]! Zasha Belleau, RN [phone]    

## 2016-07-05 NOTE — Progress Notes (Signed)
Edward [name] Date of Birth  15-Aug-1950       [place] [address], [place], [place]  [address]   [place], [place]  [address] [phone]     [phone]   Fax  [phone]    Fax [phone]  Problem List: Coronary artery disease-status post inferior wall myocardial infarction-status post PTCA and stenting of his right coronary artery 2. Dyslipidemia 3. Hypertension 4. Hypokalemia 5. Recent fall with subsequent neck fracture  History of Present Illness:  Edward [name] is a 65 year old gentleman. Who was recently hospitalized for a neck fracture after he had a syncopal episode.  It has been to the bathroom. He's not had any subsequent episodes of syncope.  He's continued to have some episodes of dizziness. Not had any episodes of chest pain or shortness breath.  November 18, 2012: Edward [name] is doing fairly well from a cardiac standpoint. He has not had any episodes of chest pain or shortness breath. He is still recovering from his neck fracture in August of 2013. He still has some residual hand tingling and numbness.  He also has some numbness in his feet.  He also has developed a peripheral neuropathy.  Jun 12, 2013:  Edward [name] is doing OK.  He has been found to have a ruptured disc that affected his gait - this ultimately is what caused his fall last year.  No further burining in feet.  Still rehabing  . He is back at work. No CP, no cardiac   Dec 15, 2013:  Edward [name] is doing well.   He has cut out most of his salt.   BP is much better.   No CP.  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  Not as much exercise recently.   Dec. 2, 2015:  Edward [name] is doing well.  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  Edward [name] had a mild chest pain - did not call or take NTG.  May have been indigestion.   He has improved his diet.  He needs fasting labs today He has requested a flu shot.  Dec. 6,  2017:  Doing well . Doing stationary bike  Labs from Dr. Inda Merlin look great   Current Outpatient Prescriptions on File Prior to Visit  Medication Sig Dispense Refill  . amLODipine (NORVASC) 5 MG tablet Take 5 mg by mouth daily.    Marland Kitchen aspirin 81 MG tablet Take 81 mg by mouth daily.    . fish oil-omega-3 fatty acids 1000 MG capsule Take 3 g by mouth daily. Take three tablets by mouth once daily    . metoprolol succinate (TOPROL-XL) 50 MG 24 hr tablet Take 50 mg by mouth daily. Take with or immediately following a meal.    . Multiple Vitamin (MULTIVITAMIN) tablet Take 1 tablet by mouth daily.    . nitroGLYCERIN (NITROSTAT) 0.4 MG SL tablet place 1 tablet under the tongue if needed for chest pain 25 tablet 3  . simvastatin (ZOCOR) 80 MG tablet Take 80 mg by mouth at bedtime.    Marland Kitchen lisinopril (PRINIVIL,ZESTRIL) 20 MG tablet Take 1 tablet (20 mg total) by mouth daily. 90 tablet 1   No current facility-administered medications on file prior to visit.     No Known Allergies  Past Medical History:  Diagnosis Date  . Chicken pox   . Coronary artery disease    a.  1999 s/p MI with cath/PCI;  b. 05/1999 Ex Cardiolite EF 68%, no ishcemia.  . Dyslipidemia   . History of tobacco abuse   . Hypertension   . Hypokalemia   . Injury of cervical spine ([place])    a. 02/2012 C4/5  . Measles   . Mumps   . Nasal bones, closed fracture    a. 02/2012 in setting of presyncope/fall  . Near syncope   . Pneumonia   . Whooping cough     Past Surgical History:  Procedure Laterality Date  . [place]  . ANTERIOR CERVICAL DECOMP/DISCECTOMY FUSION  03/13/2012   Procedure: ANTERIOR CERVICAL DECOMPRESSION/DISCECTOMY FUSION 1 LEVEL;  Surgeon: Erline Levine, MD;  Location: [facility];  Service: Neurosurgery;  Laterality: N/A;  Anterior Cervical Decompression/Fusion. Cervical four-five.  Marland Kitchen CARDIAC CATHETERIZATION  06/28/1998   single vessel CAD involving the distal RCA/PTCA and stenting of the  distal RCA//EF- 50-55%  . CLOSED REDUCTION NASAL FRACTURE  03/13/2012   Procedure: CLOSED REDUCTION NASAL FRACTURE;  Surgeon: Theodoro Kos, DO;  Location: [facility];  Service: Plastics;  Laterality: N/A;  Internal and external splinting of nasal fracture  . NM MYOVIEW LTD  05/17/2009   normal stress nuclear study/no evidence of ischemia/EF- 68%  . petalla tendon surgery  1989    History  Smoking Status  . Former Smoker  . Packs/day: 1.00  . Years: 20.00  . Types: Cigarettes  . Quit date: 11/02/1986  Smokeless Tobacco  . Never Used    History  Alcohol Use  . Yes    Comment: occasional alcoholic beverage.    Family History  Problem Relation Age of Onset  . Heart attack Father     55  . Cancer Mother     60    Reviw of Systems:  Reviewed in the HPI.  All other systems are negative.  Physical Exam: Blood pressure 140/90, pulse (!) 53, height 6' (1.829 m), weight 208 lb 6.4 oz (94.5 kg). General: Well developed, well nourished, in no acute distress Head: Normocephalic, atraumatic, sclera non-icteric, mucus membranes are moist,  Neck: Supple. Carotids are 2 + without bruits. No JVD Lungs: Clear bilaterally to auscultation. Heart: regular rate.  normal  S1 S2. No murmurs, gallops or rubs. Abdomen: Soft, non-tender, non-distended with normal bowel sounds. No hepatomegaly. No rebound/guarding. No masses. Msk:  Strength and tone are normal Extremities: No clubbing or cyanosis. No edema.  Distal pedal pulses are 2+ and equal bilaterally. Neuro: Alert and oriented X 3. Moves all extremities spontaneously. Psych:  Responds to questions appropriately with a normal affect.  ECG: Dec. 6, 2017:    Sinus brady at 53.  No ST or T wave changes.   Assessment / Plan:   1. CAD:    Doing well.   No angina   2. hyperliperdemia Labs from Dr. Inda Merlin look great   Will see him in 1 year.     Mertie Moores, MD  07/05/2016 8:33 AM    [place],  [place], [place]  [address] Pager [phone] Phone: [phone]; Fax: [phone]

## 2016-09-08 DIAGNOSIS — E291 Testicular hypofunction: Secondary | ICD-10-CM | POA: Diagnosis not present

## 2016-09-08 DIAGNOSIS — R972 Elevated prostate specific antigen [PSA]: Secondary | ICD-10-CM | POA: Diagnosis not present

## 2016-11-27 DIAGNOSIS — E7800 Pure hypercholesterolemia, unspecified: Secondary | ICD-10-CM | POA: Diagnosis not present

## 2016-11-27 DIAGNOSIS — I10 Essential (primary) hypertension: Secondary | ICD-10-CM | POA: Diagnosis not present

## 2016-12-21 DIAGNOSIS — Z23 Encounter for immunization: Secondary | ICD-10-CM | POA: Diagnosis not present

## 2016-12-21 DIAGNOSIS — E7800 Pure hypercholesterolemia, unspecified: Secondary | ICD-10-CM | POA: Diagnosis not present

## 2016-12-21 DIAGNOSIS — I10 Essential (primary) hypertension: Secondary | ICD-10-CM | POA: Diagnosis not present

## 2017-03-05 DIAGNOSIS — E291 Testicular hypofunction: Secondary | ICD-10-CM | POA: Diagnosis not present

## 2017-03-05 DIAGNOSIS — R972 Elevated prostate specific antigen [PSA]: Secondary | ICD-10-CM | POA: Diagnosis not present

## 2017-03-23 DIAGNOSIS — Z23 Encounter for immunization: Secondary | ICD-10-CM | POA: Diagnosis not present

## 2017-05-01 DIAGNOSIS — M545 Low back pain: Secondary | ICD-10-CM | POA: Diagnosis not present

## 2017-05-09 DIAGNOSIS — Z6827 Body mass index (BMI) 27.0-27.9, adult: Secondary | ICD-10-CM | POA: Diagnosis not present

## 2017-05-09 DIAGNOSIS — I10 Essential (primary) hypertension: Secondary | ICD-10-CM | POA: Diagnosis not present

## 2017-05-09 DIAGNOSIS — M542 Cervicalgia: Secondary | ICD-10-CM | POA: Diagnosis not present

## 2017-05-09 DIAGNOSIS — S14129D Central cord syndrome at unspecified level of cervical spinal cord, subsequent encounter: Secondary | ICD-10-CM | POA: Diagnosis not present

## 2017-06-27 DIAGNOSIS — I2510 Atherosclerotic heart disease of native coronary artery without angina pectoris: Secondary | ICD-10-CM | POA: Diagnosis not present

## 2017-06-27 DIAGNOSIS — I10 Essential (primary) hypertension: Secondary | ICD-10-CM | POA: Diagnosis not present

## 2017-06-27 DIAGNOSIS — E7800 Pure hypercholesterolemia, unspecified: Secondary | ICD-10-CM | POA: Diagnosis not present

## 2017-07-06 DIAGNOSIS — E291 Testicular hypofunction: Secondary | ICD-10-CM | POA: Diagnosis not present

## 2017-07-06 DIAGNOSIS — R972 Elevated prostate specific antigen [PSA]: Secondary | ICD-10-CM | POA: Diagnosis not present

## 2017-07-13 ENCOUNTER — Ambulatory Visit: Payer: Federal, State, Local not specified - PPO | Admitting: Cardiovascular Disease

## 2017-08-17 DIAGNOSIS — R972 Elevated prostate specific antigen [PSA]: Secondary | ICD-10-CM | POA: Diagnosis not present

## 2017-09-06 DIAGNOSIS — M722 Plantar fascial fibromatosis: Secondary | ICD-10-CM | POA: Diagnosis not present

## 2017-09-27 ENCOUNTER — Ambulatory Visit: Payer: Federal, State, Local not specified - PPO | Admitting: Cardiovascular Disease

## 2017-09-27 ENCOUNTER — Encounter: Payer: Self-pay | Admitting: Cardiovascular Disease

## 2017-09-27 ENCOUNTER — Encounter (INDEPENDENT_AMBULATORY_CARE_PROVIDER_SITE_OTHER): Payer: Self-pay

## 2017-09-27 VITALS — BP 158/98 | HR 54 | Ht 72.0 in | Wt 201.0 lb

## 2017-09-27 DIAGNOSIS — I2510 Atherosclerotic heart disease of native coronary artery without angina pectoris: Secondary | ICD-10-CM

## 2017-09-27 DIAGNOSIS — E782 Mixed hyperlipidemia: Secondary | ICD-10-CM

## 2017-09-27 MED ORDER — ATORVASTATIN CALCIUM 40 MG PO TABS: 40.0000 mg | ORAL_TABLET | Freq: Every day | ORAL | 3 refills | Status: DC

## 2017-09-27 NOTE — Patient Instructions (Signed)
Medication Instructions:  Your physician has recommended you make the following change in your medication:  STOP Simvastatin when you finish your current pill bottle START Atorvastatin 40 mg once daily   Labwork: Your physician recommends that you return for lab work in: 6 months  You will need to FAST for this appointment - nothing to eat or drink after midnight the night before except water.    Testing/Procedures: None Ordered   Follow-Up: Your physician wants you to follow-up in: 1 year with Dr. Acie Fredrickson.  You will receive a reminder letter in the mail two months in advance. If you don't receive a letter, please call our office to schedule the follow-up appointment.   If you need a refill on your cardiac medications before your next appointment, please call your pharmacy.   Thank you for choosing [facility]! Christen Bame, RN [phone]

## 2017-09-27 NOTE — Progress Notes (Signed)
Edward [name] Date of Birth  [birth year]       [place] [address], [place], [place]  [address]   [place], [place]  [address] [phone]     [phone]   Fax  [phone]    Fax [phone]  Problem List: Coronary artery disease-status post inferior wall myocardial infarction-status post PTCA and stenting of his right coronary artery - 1999 2. Dyslipidemia 3. Hypertension 4. Hypokalemia 5. fall with subsequent neck fracture   November 18, 2012: Edward [name] is doing fairly well from a cardiac standpoint. He has not had any episodes of chest pain or shortness breath. He is still recovering from his neck fracture in August of 2013. He still has some residual hand tingling and numbness.  He also has some numbness in his feet.  He also has developed a peripheral neuropathy.  Jun 12, 2013:  Edward [name] is doing OK.  He has been found to have a ruptured disc that affected his gait - this ultimately is what caused his fall last year.  No further burining in feet.  Still rehabing  . He is back at work. No CP, no cardiac   Dec 15, 2013:  Edward [name] is doing well.   He has cut out most of his salt.   BP is much better.   No CP.  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  Not as much exercise recently.   Dec. 2, 2015:  Edward [name] is doing well.  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  Edward [name] had a mild chest pain - did not call or take NTG.  May have been indigestion.   He has improved his diet.  He needs fasting labs today He has requested a flu shot.  Dec. 6, 2017:  Doing well . Doing stationary bike  Labs from Dr. Inda Merlin look great   Feb. 28, 2019:  Edward [name] is seen today for follow up of his CAD .  Doing well  Talked about his daughter, Andee Poles who works as a Marine scientist  No CP or dyspnea ,  Is not as active as he needs to be  BP is elevated.   Did not take his meds yet today  Has gained  some weight     Current Outpatient Medications on File Prior to Visit  Medication Sig Dispense Refill  . amLODipine (NORVASC) 5 MG tablet Take 5 mg by mouth daily.    Marland Kitchen aspirin 81 MG tablet Take 81 mg by mouth daily.    . fish oil-omega-3 fatty acids 1000 MG capsule Take 3 g by mouth daily. Take three tablets by mouth once daily    . lisinopril (PRINIVIL,ZESTRIL) 20 MG tablet Take 1 tablet (20 mg total) by mouth daily. 90 tablet 1  . metoprolol succinate (TOPROL-XL) 50 MG 24 hr tablet Take 50 mg by mouth daily. Take with or immediately following a meal.    . Multiple Vitamin (MULTIVITAMIN) tablet Take 1 tablet by mouth daily.    . nitroGLYCERIN (NITROSTAT) 0.4 MG SL tablet place 1 tablet under the tongue if needed for chest pain 25 tablet 6  . simvastatin (ZOCOR) 80 MG tablet Take 80 mg by mouth at bedtime.     No current facility-administered medications on file prior to visit.     No Known Allergies  Past Medical History:  Diagnosis Date  . Chicken pox   .  Coronary artery disease    a. 1999 s/p MI with cath/PCI;  b. 05/1999 Ex Cardiolite EF 68%, no ishcemia.  . Dyslipidemia   . History of tobacco abuse   . Hypertension   . Hypokalemia   . Injury of cervical spine ([place])    a. 02/2012 C4/5  . Measles   . Mumps   . Nasal bones, closed fracture    a. 02/2012 in setting of presyncope/fall  . Near syncope   . Pneumonia   . Whooping cough     Past Surgical History:  Procedure Laterality Date  . [place]  . ANTERIOR CERVICAL DECOMP/DISCECTOMY FUSION  03/13/2012   Procedure: ANTERIOR CERVICAL DECOMPRESSION/DISCECTOMY FUSION 1 LEVEL;  Surgeon: Erline Levine, MD;  Location: [facility];  Service: Neurosurgery;  Laterality: N/A;  Anterior Cervical Decompression/Fusion. Cervical four-five.  Marland Kitchen CARDIAC CATHETERIZATION  06/28/1998   single vessel CAD involving the distal RCA/PTCA and stenting of the distal RCA//EF- 50-55%  . CLOSED REDUCTION NASAL FRACTURE  03/13/2012    Procedure: CLOSED REDUCTION NASAL FRACTURE;  Surgeon: Theodoro Kos, DO;  Location: [facility];  Service: Plastics;  Laterality: N/A;  Internal and external splinting of nasal fracture  . NM MYOVIEW LTD  05/17/2009   normal stress nuclear study/no evidence of ischemia/EF- 68%  . petalla tendon surgery  1989    Social History   Tobacco Use  Smoking Status Former Smoker  . Packs/day: 1.00  . Years: 20.00  . Pack years: 20.00  . Types: Cigarettes  . Last attempt to quit: 11/02/1986  . Years since quitting: 30.9  Smokeless Tobacco Never Used    Social History   Substance and Sexual Activity  Alcohol Use Yes   Comment: occasional alcoholic beverage.    Family History  Problem Relation Age of Onset  . Heart attack Father        35  . Cancer Mother        46    Reviw of Systems:  Reviewed in the HPI.  All other systems are negative.  Physical Exam: There were no vitals taken for this visit.  GEN:  Well nourished, well developed in no acute distress HEENT: Normal NECK: No JVD; No carotid bruits LYMPHATICS: No lymphadenopathy CARDIAC: RR,  RESPIRATORY:  Clear to auscultation without rales, wheezing or rhonchi  ABDOMEN: Soft, non-tender, non-distended MUSCULOSKELETAL:  No edema; No deformity  SKIN: Warm and dry NEUROLOGIC:  Alert and oriented x 3   ECG: September 27, 2017: Sinus bradycardia at 54.  EKG is otherwise normal.    Assessment / Plan:   1. CAD:   Doing well s/p PCI in 1999.  No angina      2. hyperliperdemia - is on Simva 80 mg a day .  Just got a 90 day supply.  Will change to Atorvastatin 40 mg a day .  Check lipids, liver, bmp in 6 months    3.  HTN:   Needs to lose weight , he will check BP regularly and call us if he BP is elevated.    Will see him in 1 year.     Mertie Moores, MD  09/27/2017 8:47 AM    [place],  [place],   [address] Pager [phone] Phone: [phone];  Fax: [phone]

## 2017-10-24 DIAGNOSIS — M79671 Pain in right foot: Secondary | ICD-10-CM | POA: Diagnosis not present

## 2017-10-24 DIAGNOSIS — M79672 Pain in left foot: Secondary | ICD-10-CM | POA: Diagnosis not present

## 2017-10-24 DIAGNOSIS — M722 Plantar fascial fibromatosis: Secondary | ICD-10-CM | POA: Diagnosis not present

## 2017-11-26 DIAGNOSIS — R972 Elevated prostate specific antigen [PSA]: Secondary | ICD-10-CM | POA: Diagnosis not present

## 2017-12-03 ENCOUNTER — Other Ambulatory Visit: Payer: Self-pay | Admitting: Urology

## 2017-12-03 DIAGNOSIS — C61 Malignant neoplasm of prostate: Secondary | ICD-10-CM

## 2017-12-17 ENCOUNTER — Encounter (HOSPITAL_COMMUNITY)
Admission: RE | Admit: 2017-12-17 | Discharge: 2017-12-17 | Disposition: A | Discharge status: Home / Self Care | Payer: Federal, State, Local not specified - PPO | Source: Ambulatory Visit | Attending: Urology | Admitting: Urology

## 2017-12-17 DIAGNOSIS — C61 Malignant neoplasm of prostate: Secondary | ICD-10-CM | POA: Diagnosis not present

## 2017-12-17 MED ORDER — TECHNETIUM TC 99M MEDRONATE IV KIT
21.6000 | PACK | Freq: Once | INTRAVENOUS | Status: AC | PRN
  Administered 2017-12-17: 21.6 via INTRAVENOUS

## 2018-01-01 DIAGNOSIS — C61 Malignant neoplasm of prostate: Secondary | ICD-10-CM | POA: Diagnosis not present

## 2018-01-04 ENCOUNTER — Encounter: Payer: Self-pay | Admitting: Radiation Oncology

## 2018-01-08 ENCOUNTER — Telehealth: Payer: Self-pay | Admitting: Cardiovascular Disease

## 2018-01-08 NOTE — Telephone Encounter (Signed)
New Message      [facility] Pre-operative Risk Assessment    Request for surgical clearance:  1. What type of surgery is being performed? Robot assisted laparoscopy radical proctectomy with bilateral laminectomy  2. When is this surgery scheduled?  TBD    3. What type of clearance is required (medical clearance vs. Pharmacy clearance to hold med vs. Both)? Medical Clearance   4. Are there any medications that need to be held prior to surgery and how long? no   5. Practice name and name of physician performing surgery? [facility] Dr. Wynetta Emery Dorden   6. What is your office phone number [phone] ext [phone]     7.   What is your office fax number [phone]   8.   Anesthesia type (None, local, MAC, general) ? General     Edward [name] 01/08/2018, 11:20 AM  _________________________________________________________________   (provider comments below)

## 2018-01-09 DIAGNOSIS — I10 Essential (primary) hypertension: Secondary | ICD-10-CM | POA: Diagnosis not present

## 2018-01-09 DIAGNOSIS — E7800 Pure hypercholesterolemia, unspecified: Secondary | ICD-10-CM | POA: Diagnosis not present

## 2018-01-09 DIAGNOSIS — I2510 Atherosclerotic heart disease of native coronary artery without angina pectoris: Secondary | ICD-10-CM | POA: Diagnosis not present

## 2018-01-09 DIAGNOSIS — C61 Malignant neoplasm of prostate: Secondary | ICD-10-CM | POA: Diagnosis not present

## 2018-01-09 NOTE — Telephone Encounter (Signed)
   Primary Cardiologist: Mertie Moores, MD  Chart reviewed as part of pre-operative protocol coverage. Patient was contacted 01/09/2018 in reference to pre-operative risk assessment for pending surgery as outlined below.  JAMI OHLIN was last seen on 09/27/17 by Dr. Acie Fredrickson and doing well at that time w/o cardiac symptoms.  I am unable to reach pt by phone. LVM to call preop clinic back.   Lyda Jester, PA-C 01/09/2018, 3:24 PM

## 2018-01-10 NOTE — Telephone Encounter (Signed)
Left voice mail to call back between pre-op hours.  

## 2018-01-11 NOTE — Telephone Encounter (Addendum)
Left message for patient to call back.  67 y.o. male with hx of inferior MI in 1999 tx with stenting, hypertension, hyperlipidemia.  Last seen 08/2017.  RCRI 0.9%.  Patient needs phone call to make sure no clinical change since last OV. Richardson Dopp, PA-C    01/11/2018 10:46 AM

## 2018-01-15 NOTE — Telephone Encounter (Signed)
   Primary Cardiologist: Mertie Moores, MD  Chart reviewed as part of pre-operative protocol coverage. Patient was contacted 01/15/2018 in reference to pre-operative risk assessment for pending surgery as outlined below.  MIHAILO SAGE was last seen on 09/27/17 by Dr. Acie Fredrickson.  Since that day, HANSON MEDEIROS has done well with no chest pain, shortness of breath or new symptoms. RCRI risk calculator shows 0.9% risk of peri-operative cardiac event which is low risk. Also the patient reports being very active at home, working most days, with no exertional symptoms.  Therefore, based on ACC/AHA guidelines, the patient would be at acceptable risk for the planned procedure without further cardiovascular testing.   I will route this recommendation to the requesting party via Epic fax function and remove from pre-op pool.  Please call with questions.  Daune Perch, NP 01/15/2018, 3:11 PM

## 2018-01-16 DIAGNOSIS — C61 Malignant neoplasm of prostate: Secondary | ICD-10-CM | POA: Diagnosis not present

## 2018-01-16 DIAGNOSIS — M6281 Muscle weakness (generalized): Secondary | ICD-10-CM | POA: Diagnosis not present

## 2018-01-17 ENCOUNTER — Encounter: Payer: Self-pay | Admitting: Radiation Oncology

## 2018-01-17 NOTE — Progress Notes (Signed)
GU Location of Tumor / Histology: prostatic adenocarcinoma   If Prostate Cancer, Gleason Score is (5 + 4) and PSA is (4.55). Prostate volume: 48.6 cc  08/17/2017 PSA  4.56 03/05/2018 PSA  5.03 09/08/2016 PSA  3.77 10/13/2015 PSA  4.06 07/08/2015 PSA  4.57 06/17/2014 PSA  3.15 12/08/2013 PSA  3.43 04/14/2013 PSA  2.55  Edward [name] was scheduled for an initial prostate biopsy January 2019 but he did not stop his aspirin in time thus initial biopsy was done 11/26/2017.  Biopsies of prostate (if applicable) revealed:    Past/Anticipated interventions by urology, if any: prostate biopsy, bone scan nor CT/abd/pelvis revealed metastatic disease, referral to radiation oncology  Past/Anticipated interventions by medical oncology, if any: no  Weight changes, if any: no  Bowel/Bladder complaints, if any:    Nausea/Vomiting, if any: no  Pain issues, if any:  Back pain  SAFETY ISSUES:  Prior radiation?   Pacemaker/ICD?   Possible current pregnancy? no  Is the patient on methotrexate?   Current Complaints / other details:  67 year old male. Married. Works as a Special educational needs teacher. Former smoker.   Patient leaning toward surgery.

## 2018-01-21 ENCOUNTER — Telehealth: Payer: Self-pay | Admitting: Radiation Oncology

## 2018-01-21 ENCOUNTER — Ambulatory Visit
Admission: RE | Admit: 2018-01-21 | Discharge: 2018-01-21 | Disposition: A | Discharge status: Home / Self Care | Payer: Federal, State, Local not specified - PPO | Source: Ambulatory Visit | Attending: Radiation Oncology | Admitting: Radiation Oncology

## 2018-01-21 DIAGNOSIS — C61 Malignant neoplasm of prostate: Secondary | ICD-10-CM

## 2018-01-21 HISTORY — DX: Malignant neoplasm of prostate: C61

## 2018-01-21 NOTE — Telephone Encounter (Signed)
Patient has not shown for 0730 appointment. Phoned home to inquire. No answer. Left message with my contact information.

## 2018-01-21 NOTE — Progress Notes (Signed)
Patient did not show for consult this morning. Chart sent back to front to reschedule. Phoned call made to patient's home. No answer. See progress note.

## 2018-01-23 ENCOUNTER — Other Ambulatory Visit: Payer: Self-pay | Admitting: Urology

## 2018-01-23 DIAGNOSIS — N393 Stress incontinence (female) (male): Secondary | ICD-10-CM | POA: Diagnosis not present

## 2018-01-23 DIAGNOSIS — M6281 Muscle weakness (generalized): Secondary | ICD-10-CM | POA: Diagnosis not present

## 2018-01-23 DIAGNOSIS — M62838 Other muscle spasm: Secondary | ICD-10-CM | POA: Diagnosis not present

## 2018-02-06 NOTE — Patient Instructions (Addendum)
Edward [name]  02/06/2018   Your procedure is scheduled on: 02-14-18   Report to [facility] Main  Entrance    Report to admitting at 5:30AM    Call this number if you have problems the morning of surgery [phone]     Remember: Do not eat food After Midnight on Tuesday 02-12-18. Drink plenty of on Wednesday 02-13-18 and follow all bowel prep instruction provided by your surgeon. Nothing by mouth after midnight on Wednesday!  PER YOUR SURGEON ORDER: PLEASE COMPLETE A FLEETS ENEMA THE NIGHT BEFORE SURGERY !        CLEAR LIQUID DIET   Foods Allowed                                                                     Foods Excluded  Coffee and tea, regular and decaf                             liquids that you cannot  Plain Jell-O in any flavor                                             see through such as: Fruit ices (not with fruit pulp)                                     milk, soups, orange juice  Iced Popsicles                                    All solid food Carbonated beverages, regular and diet                                    Cranberry, grape and apple juices Sports drinks like Gatorade Lightly seasoned clear broth or consume(fat free) Sugar, honey syrup  Sample Menu Breakfast                                Lunch                                     Supper Cranberry juice                    Beef broth                            Chicken broth Jell-O                                     Grape juice  Apple juice Coffee or tea                        Jell-O                                      Popsicle                                                Coffee or tea                        Coffee or tea  _____________________________________________________________________       Take these medicines the morning of surgery with A SIP OF WATER: amlodipine, atorvastatin, metoprolol                                You may  not have any metal on your body including hair pins and              piercings  Do not wear jewelry, make-up, lotions, powders or perfumes, deodorant                      Men may shave face and neck.   Do not bring valuables to the hospital. [name].  Contacts, dentures or bridgework may not be worn into surgery.  Leave suitcase in the car. After surgery it may be brought to your room.                 Please read over the following fact sheets you were given: _____________________________________________________________________             [name] - Preparing for Surgery Before surgery, you can play an important role.  Because skin is not sterile, your skin needs to be as free of germs as possible.  You can reduce the number of germs on your skin by washing with CHG (chlorahexidine gluconate) soap before surgery.  CHG is an antiseptic cleaner which kills germs and bonds with the skin to continue killing germs even after washing. Please DO NOT use if you have an allergy to CHG or antibacterial soaps.  If your skin becomes reddened/irritated stop using the CHG and inform your nurse when you arrive at Short Stay. Do not shave (including legs and underarms) for at least 48 hours prior to the first CHG shower.  You may shave your face/neck. Please follow these instructions carefully:  1.  Shower with CHG Soap the night before surgery and the  morning of Surgery.  2.  If you choose to wash your hair, wash your hair first as usual with your  normal  shampoo.  3.  After you shampoo, rinse your hair and body thoroughly to remove the  shampoo.                           4.  Use CHG as you would any other liquid soap.  You can apply chg directly  to the skin and wash  Gently with a scrungie or clean washcloth.  5.  Apply the CHG Soap to your body ONLY FROM THE NECK DOWN.   Do not use on face/ open                            Wound or open sores. Avoid contact with eyes, ears mouth and genitals (private parts).                       Wash face,  Genitals (private parts) with your normal soap.             6.  Wash thoroughly, paying special attention to the area where your surgery  will be performed.  7.  Thoroughly rinse your body with warm water from the neck down.  8.  DO NOT shower/wash with your normal soap after using and rinsing off  the CHG Soap.                9.  Pat yourself dry with a clean towel.            10.  Wear clean pajamas.            11.  Place clean sheets on your bed the night of your first shower and do not  sleep with pets. Day of Surgery : Do not apply any lotions/deodorants the morning of surgery.  Please wear clean clothes to the hospital/surgery center.  FAILURE TO FOLLOW THESE INSTRUCTIONS MAY RESULT IN THE CANCELLATION OF YOUR SURGERY PATIENT SIGNATURE_________________________________  NURSE SIGNATURE__________________________________  ________________________________________________________________________   [name]  An incentive spirometer is a tool that can help keep your lungs clear and active. This tool measures how well you are filling your lungs with each breath. Taking long deep breaths may help reverse or decrease the chance of developing breathing (pulmonary) problems (especially infection) following:  A long period of time when you are unable to move or be active. BEFORE THE PROCEDURE   If the spirometer includes an indicator to show your best effort, your nurse or respiratory therapist will set it to a desired goal.  If possible, sit up straight or lean slightly forward. Try not to slouch.  Hold the incentive spirometer in an upright position. INSTRUCTIONS FOR USE  1. Sit on the edge of your bed if possible, or sit up as far as you can in bed or on a chair. 2. Hold the incentive spirometer in an upright position. 3. Breathe out normally. 4. Place  the mouthpiece in your mouth and seal your lips tightly around it. 5. Breathe in slowly and as deeply as possible, raising the piston or the ball toward the top of the column. 6. Hold your breath for 3-5 seconds or for as long as possible. Allow the piston or ball to fall to the bottom of the column. 7. Remove the mouthpiece from your mouth and breathe out normally. 8. Rest for a few seconds and repeat Steps 1 through 7 at least 10 times every 1-2 hours when you are awake. Take your time and take a few normal breaths between deep breaths. 9. The spirometer may include an indicator to show your best effort. Use the indicator as a goal to work toward during each repetition. 10. After each set of 10 deep breaths, practice coughing to be sure your lungs are clear. If you have an incision (the cut made at the time of  surgery), support your incision when coughing by placing a pillow or rolled up towels firmly against it. Once you are able to get out of bed, walk around indoors and cough well. You may stop using the incentive spirometer when instructed by your caregiver.  RISKS AND COMPLICATIONS  Take your time so you do not get dizzy or light-headed.  If you are in pain, you may need to take or ask for pain medication before doing incentive spirometry. It is harder to take a deep breath if you are having pain. AFTER USE  Rest and breathe slowly and easily.  It can be helpful to keep track of a log of your progress. Your caregiver can provide you with a simple table to help with this. If you are using the spirometer at home, follow these instructions: [place] IF:   You are having difficultly using the spirometer.  You have trouble using the spirometer as often as instructed.  Your pain medication is not giving enough relief while using the spirometer.  You develop fever of 100.5 F (38.1 C) or higher. SEEK IMMEDIATE MEDICAL CARE IF:   You cough up bloody sputum that had not been  present before.  You develop fever of 102 F (38.9 C) or greater.  You develop worsening pain at or near the incision site. MAKE SURE YOU:   Understand these instructions.  Will watch your condition.  Will get help right away if you are not doing well or get worse. Document Released: 11/27/2006 Document Revised: 10/09/2011 Document Reviewed: 01/28/2007 ExitCare Patient Information 2014 [employer], [place].   ________________________________________________________________________  WHAT IS A BLOOD TRANSFUSION? Blood Transfusion Information  A transfusion is the replacement of blood or some of its parts. Blood is made up of multiple cells which provide different functions.  Red blood cells carry oxygen and are used for blood loss replacement.  White blood cells fight against infection.  Platelets control bleeding.  Plasma helps clot blood.  Other blood products are available for specialized needs, such as hemophilia or other clotting disorders. BEFORE THE TRANSFUSION  Who gives blood for transfusions?   Healthy volunteers who are fully evaluated to make sure their blood is safe. This is blood bank blood. Transfusion therapy is the safest it has ever been in the practice of medicine. Before blood is taken from a donor, a complete history is taken to make sure that person has no history of diseases nor engages in risky social behavior (examples are intravenous drug use or sexual activity with multiple partners). The donor's travel history is screened to minimize risk of transmitting infections, such as malaria. The donated blood is tested for signs of infectious diseases, such as HIV and hepatitis. The blood is then tested to be sure it is compatible with you in order to minimize the chance of a transfusion reaction. If you or a relative donates blood, this is often done in anticipation of surgery and is not appropriate for emergency situations. It takes many days to process the donated  blood. RISKS AND COMPLICATIONS Although transfusion therapy is very safe and saves many lives, the main dangers of transfusion include:   Getting an infectious disease.  Developing a transfusion reaction. This is an allergic reaction to something in the blood you were given. Every precaution is taken to prevent this. The decision to have a blood transfusion has been considered carefully by your caregiver before blood is given. Blood is not given unless the benefits outweigh the risks. AFTER THE  TRANSFUSION  Right after receiving a blood transfusion, you will usually feel much better and more energetic. This is especially true if your red blood cells have gotten low (anemic). The transfusion raises the level of the red blood cells which carry oxygen, and this usually causes an energy increase.  The nurse administering the transfusion will monitor you carefully for complications. HOME CARE INSTRUCTIONS  No special instructions are needed after a transfusion. You may find your energy is better. Speak with your caregiver about any limitations on activity for underlying diseases you may have. SEEK MEDICAL CARE IF:   Your condition is not improving after your transfusion.  You develop redness or irritation at the intravenous (IV) site. SEEK IMMEDIATE MEDICAL CARE IF:  Any of the following symptoms occur over the next 12 hours:  Shaking chills.  You have a temperature by mouth above 102 F (38.9 C), not controlled by medicine.  Chest, back, or muscle pain.  People around you feel you are not acting correctly or are confused.  Shortness of breath or difficulty breathing.  Dizziness and fainting.  You get a rash or develop hives.  You have a decrease in urine output.  Your urine turns a dark color or changes to pink, red, or brown. Any of the following symptoms occur over the next 10 days:  You have a temperature by mouth above 102 F (38.9 C), not controlled by  medicine.  Shortness of breath.  Weakness after normal activity.  The white part of the eye turns yellow (jaundice).  You have a decrease in the amount of urine or are urinating less often.  Your urine turns a dark color or changes to pink, red, or brown. Document Released: 07/14/2000 Document Revised: 10/09/2011 Document Reviewed: 03/02/2008 [facility] Patient Information 2014 [place], [place].  _______________________________________________________________________

## 2018-02-06 NOTE — Progress Notes (Signed)
Cardiac clearance Edward [name] ,NP tele note epic/on chart   ekg 09-27-17 epic

## 2018-02-07 ENCOUNTER — Encounter (HOSPITAL_COMMUNITY): Payer: Self-pay

## 2018-02-07 ENCOUNTER — Encounter (HOSPITAL_COMMUNITY)
Admission: RE | Admit: 2018-02-07 | Discharge: 2018-02-07 | Disposition: A | Discharge status: Home / Self Care | Payer: Federal, State, Local not specified - PPO | Source: Ambulatory Visit | Attending: Urology | Admitting: Urology

## 2018-02-07 ENCOUNTER — Other Ambulatory Visit: Payer: Self-pay

## 2018-02-07 DIAGNOSIS — Z0183 Encounter for blood typing: Secondary | ICD-10-CM | POA: Diagnosis not present

## 2018-02-07 DIAGNOSIS — Z01812 Encounter for preprocedural laboratory examination: Secondary | ICD-10-CM | POA: Diagnosis not present

## 2018-02-07 DIAGNOSIS — C61 Malignant neoplasm of prostate: Secondary | ICD-10-CM | POA: Diagnosis not present

## 2018-02-07 LAB — BASIC METABOLIC PANEL
Anion gap: 8 (ref 5–15)
BUN: 12 mg/dL (ref 8–23)
CO2: 26 mmol/L (ref 22–32)
Calcium: 8.8 mg/dL — ABNORMAL LOW (ref 8.9–10.3)
Chloride: 109 mmol/L (ref 98–111)
Creatinine, Ser: 0.93 mg/dL (ref 0.61–1.24)
GFR calc Af Amer: >60 mL/min (ref >60)
GFR calc non Af Amer: >60 mL/min (ref >60)
Glucose, Bld: 96 mg/dL (ref 70–99)
Potassium: 3.8 mmol/L (ref 3.5–5.1)
Sodium: 143 mmol/L (ref 135–145)

## 2018-02-07 LAB — CBC
HCT: 39 % (ref 39.0–52.0)
Hemoglobin: 13.8 g/dL (ref 13.0–17.0)
MCH: 32.1 pg (ref 26.0–34.0)
MCHC: 35.4 g/dL (ref 30.0–36.0)
MCV: 90.7 fL (ref 78.0–100.0)
Platelets: 248 10*3/uL (ref 150–400)
RBC: 4.3 MIL/uL (ref 4.22–5.81)
RDW: 12.6 % (ref 11.5–15.5)
WBC: 3.8 10*3/uL — ABNORMAL LOW (ref 4.0–10.5)

## 2018-02-07 LAB — ABO/RH: ABO/RH(D): B POS

## 2018-02-13 NOTE — H&P (Signed)
CC/HPI: CC: Prostate Cancer    [name] is a 67 year old gentleman who was noted to have an elevated PSA of 5.03. He was scheduled to undergo a prostate biopsy but initially failed to prepare appropriately. His PSA was therefore rechecked and had decreased to 4.55. He eventually rescheduled and underwent a TRUS biopsy of the prostate on 11/26/17 that demonstrated Gleason 5+4=9 adenocarcinoma with 6 out of 12 biopsy cores (all on the right) positive for malignancy. Staging studies have been negative for obvious metastatic disease.   Family history: None.   Imaging studies:  CT scan (12/17/17): Sclerotic lesion of L4, small and felt to likely be a benign bone island, no LAD  Bone scan (12/17/17): Multiple areas of uptake felt to be degenerative, one non-specific area of uptake at left maxilla   PMH: He has a history of hypertension, CAD s/p MI, and hyperlipidemia. He is followed by Dr. Acie Fredrickson. He has a cardiac stent and is managed with ASA 81 mg. He does have nitroglycerin to use as needed but has not used this medication in many years.  PSH: No abdominal surgeries.   TNM stage: cT1c N0 M0  PSA: 4.55  Gleason score: 5+4=9  Biopsy (11/26/17): 6/12 cores positive  Left: Benign  Right: R apex (50%, 5+4=9, PNI), R lateral apex (90%, 4+4=8), R mid (30%, 5+4=9), R lateral mid (70%, 4+5=9), R base (5%, 4+5=9), R lateral base (90%, 4+5=9)  Prostate volume: 48.6 cc   Nomogram  OC disease: 25%  EPE: 72%  SVI: 19%  LNI: 18%  PFS (5 year, 10 year): 50%, 35%   Urinary function: IPSS is 15.  Erectile function: SHIM score is 5. He has noted some response to PDE 5 inhibitors.     ALLERGIES: No Allergies    MEDICATIONS: Amlodipine Besylate 5 mg tablet Oral  Aspirin 81 MG TABS Oral  B-12 TABS Oral  Lisinopril 20 mg tablet Oral  Metoprolol Succinate 50 mg tablet, extended release 24 hr Oral  Multiple Vitamin TABS Oral  Nitrostat 0.4 MG Sublingual Tablet Sublingual Sublingual  Simvastatin 80  mg tablet Oral  Stool Softener TABS Oral  Vitamin D3     GU PSH: Locm 300-399Mg /Ml Iodine,1Ml - 12/17/2017 Prostate Needle Biopsy - 11/26/2017      PSH Notes: Nose Surgery, Colonoscopy (Fiberoptic)   NON-GU PSH: Diagnostic Colonoscopy - 2010 Surgical Pathology, Gross And Microscopic Examination For Prostate Needle - 11/26/2017    GU PMH: Elevated PSA - 11/26/2017, Elevated PSA--pt did not prep for bx adequately, - 08/17/2017 (Worsening), He has had a significant PSA increased over the past 10 years, currently the PSA is just over 5., - 07/06/2017, His PSA was 4.06 last year. We need to keep him on surveillance. Digital rectal exam today was normal, - 09/08/2016, Elevated PSA, - 07/08/2015 Primary hypogonadism, Currently on no specific treatment - 07/06/2017, Currently, he is not on therapy for his hypogonadism. Transdermal preparations are too expensive, - 09/08/2016, Hypogonadism, testicular, - 2015 ED due to arterial insufficiency, Erectile dysfunction due to arterial insufficiency - 07/06/2015 Encounter for Prostate Cancer screening, Prostate cancer screening - 07/06/2015 Premature ejaculation, Premature ejaculation - 2014      PMH Notes:  2006-10-22 14:56:34 - Note: Acute Myocardial Infarction   NON-GU PMH: Decreased libido, Decreased libido - 2015 Personal history of other diseases of the circulatory system, History of hypertension - 2014 Personal history of other endocrine, nutritional and metabolic disease, History of hypercholesterolemia - 2014 Encounter for general adult medical examination  without abnormal findings, Encounter for preventive health examination Oct 29, 2007    FAMILY HISTORY: Death - Mother, Father Death of family member - Runs In [place] Status Number - Runs In Family   SOCIAL HISTORY: Marital Status: Married Preferred Language: English; Ethnicity: Not Hispanic Or Latino; Race: Black or African American Current Smoking Status: Patient does not smoke anymore.  Smoked for 20 years.   Tobacco Use Assessment Completed: Used Tobacco in last 30 days? Social Drinker.  Drinks 2 caffeinated drinks per day. 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     Notes: Former smoker, Occupation:, Marital History - Currently Married, Alcohol Use, Caffeine Use   REVIEW OF SYSTEMS:    GU Review Male:   Patient denies frequent urination, hard to postpone urination, burning/ pain with urination, get up at night to urinate, leakage of urine, stream starts and stops, trouble starting your streams, and have to strain to urinate .  Gastrointestinal (Upper):   Patient denies nausea and vomiting.  Gastrointestinal (Lower):   Patient denies diarrhea and constipation.  Constitutional:   Patient denies fever, night sweats, weight loss, and fatigue.  Skin:   Patient denies skin rash/ lesion and itching.  Eyes:   Patient denies blurred vision and double vision.  Ears/ Nose/ Throat:   Patient denies sore throat and sinus problems.  Hematologic/Lymphatic:   Patient denies swollen glands and easy bruising.  Cardiovascular:   Patient denies leg swelling and chest pains.  Respiratory:   Patient denies cough and shortness of breath.  Endocrine:   Patient denies excessive thirst.  Musculoskeletal:   Patient reports back pain. Patient denies joint pain.  Neurological:   Patient denies headaches and dizziness.  Psychologic:   Patient denies depression and anxiety.   VITAL SIGNS:     Weight 201 lb / 91.17 kg  Height 72 in / 182.88 cm  BMI 27.3 kg/m    MULTI-SYSTEM PHYSICAL EXAMINATION:    Constitutional: Well-nourished. No physical deformities. Normally developed. Good grooming.  Neck: Neck symmetrical, not swollen. Normal tracheal position.  Respiratory: No labored breathing, no use of accessory muscles. Normal breath sounds. Clear bilaterally.  Cardiovascular: Regular rate and rhythm. No murmur, no gallop. Normal temperature, normal extremity pulses, no swelling, no  varicosities.  Lymphatic: No enlargement of neck, axillae, groin.  Skin: No paleness, no jaundice, no cyanosis. No lesion, no ulcer, no rash.  Neurologic / Psychiatric: Oriented to time, oriented to place, oriented to person. No depression, no anxiety, no agitation.  Gastrointestinal: No mass, no tenderness, no rigidity, non obese abdomen.  Eyes: Normal conjunctivae. Normal eyelids.  Ears, Nose, Mouth, and Throat: Left ear no scars, no lesions, no masses. Right ear no scars, no lesions, no masses. Nose no scars, no lesions, no masses. Normal hearing. Normal lips.  Musculoskeletal: Normal gait and station of head and neck.        ASSESSMENT:      ICD-10 Details  1 GU:   Prostate Cancer - C61    PLAN:            Notes:   1. High-risk prostate cancer: He has elected surgical treatment.  He has received cardiac risk assessment and has a < 1% risk of cardiac complications.  He will undergo a unilateral left nerve-sparing robot assisted laparoscopic radical prostatectomy and bilateral pelvic lymphadenectomy. He continue his aspirin 81 mg perioperatively considering his history of prior cardiac stent placement.

## 2018-02-14 ENCOUNTER — Encounter (HOSPITAL_COMMUNITY)
Admission: RE | Disposition: A | Discharge status: Home / Self Care | Payer: Self-pay | Source: Ambulatory Visit | Attending: Urology

## 2018-02-14 ENCOUNTER — Ambulatory Visit (HOSPITAL_COMMUNITY): Payer: Federal, State, Local not specified - PPO | Admitting: Certified Registered Nurse Anesthetist

## 2018-02-14 ENCOUNTER — Other Ambulatory Visit: Payer: Self-pay

## 2018-02-14 ENCOUNTER — Encounter (HOSPITAL_COMMUNITY): Payer: Self-pay | Admitting: Certified Registered Nurse Anesthetist

## 2018-02-14 ENCOUNTER — Observation Stay (HOSPITAL_COMMUNITY)
Admission: RE | Admit: 2018-02-14 | Discharge: 2018-02-15 | Disposition: A | Discharge status: Home / Self Care | Payer: Federal, State, Local not specified - PPO | Source: Ambulatory Visit | Attending: Urology | Admitting: Urology

## 2018-02-14 DIAGNOSIS — C61 Malignant neoplasm of prostate: Secondary | ICD-10-CM | POA: Diagnosis not present

## 2018-02-14 DIAGNOSIS — I252 Old myocardial infarction: Secondary | ICD-10-CM | POA: Diagnosis not present

## 2018-02-14 DIAGNOSIS — Z87891 Personal history of nicotine dependence: Secondary | ICD-10-CM | POA: Diagnosis not present

## 2018-02-14 DIAGNOSIS — I739 Peripheral vascular disease, unspecified: Secondary | ICD-10-CM | POA: Insufficient documentation

## 2018-02-14 DIAGNOSIS — I2510 Atherosclerotic heart disease of native coronary artery without angina pectoris: Secondary | ICD-10-CM | POA: Diagnosis not present

## 2018-02-14 DIAGNOSIS — Z79899 Other long term (current) drug therapy: Secondary | ICD-10-CM | POA: Diagnosis not present

## 2018-02-14 DIAGNOSIS — E7800 Pure hypercholesterolemia, unspecified: Secondary | ICD-10-CM | POA: Insufficient documentation

## 2018-02-14 DIAGNOSIS — I10 Essential (primary) hypertension: Secondary | ICD-10-CM | POA: Diagnosis not present

## 2018-02-14 DIAGNOSIS — C775 Secondary and unspecified malignant neoplasm of intrapelvic lymph nodes: Secondary | ICD-10-CM | POA: Insufficient documentation

## 2018-02-14 HISTORY — PX: LYMPHADENECTOMY: SHX5960

## 2018-02-14 HISTORY — PX: ROBOT ASSISTED LAPAROSCOPIC RADICAL PROSTATECTOMY: SHX5141

## 2018-02-14 LAB — TYPE AND SCREEN
ABO/RH(D): B POS
Antibody Screen: NEGATIVE

## 2018-02-14 LAB — HEMOGLOBIN AND HEMATOCRIT, BLOOD
HCT: 40.6 % (ref 39.0–52.0)
Hemoglobin: 14.2 g/dL (ref 13.0–17.0)

## 2018-02-14 SURGERY — PROSTATECTOMY, RADICAL, ROBOT-ASSISTED, LAPAROSCOPIC
Anesthesia: General

## 2018-02-14 MED ORDER — KETOROLAC TROMETHAMINE 15 MG/ML IJ SOLN
15.0000 mg | Freq: Four times a day (QID) | INTRAMUSCULAR | Status: DC
  Administered 2018-02-14 – 2018-02-15 (×4): 15 mg via INTRAVENOUS
  Filled 2018-02-14 (×4): qty 1

## 2018-02-14 MED ORDER — ROCURONIUM BROMIDE 10 MG/ML (PF) SYRINGE
PREFILLED_SYRINGE | INTRAVENOUS | Status: DC | PRN
  Administered 2018-02-14: 70 mg via INTRAVENOUS
  Administered 2018-02-14: 20 mg via INTRAVENOUS
  Administered 2018-02-14: 30 mg via INTRAVENOUS

## 2018-02-14 MED ORDER — PHENYLEPHRINE 40 MCG/ML (10ML) SYRINGE FOR IV PUSH (FOR BLOOD PRESSURE SUPPORT)
PREFILLED_SYRINGE | INTRAVENOUS | Status: AC
  Filled 2018-02-14: qty 10

## 2018-02-14 MED ORDER — FENTANYL CITRATE (PF) 100 MCG/2ML IJ SOLN
25.0000 ug | INTRAMUSCULAR | Status: DC | PRN
  Administered 2018-02-14 (×3): 50 ug via INTRAVENOUS

## 2018-02-14 MED ORDER — DEXAMETHASONE SODIUM PHOSPHATE 10 MG/ML IJ SOLN
INTRAMUSCULAR | Status: DC | PRN
  Administered 2018-02-14: 10 mg via INTRAVENOUS

## 2018-02-14 MED ORDER — INDIGOTINDISULFONATE SODIUM 8 MG/ML IJ SOLN
INTRAMUSCULAR | Status: DC | PRN
  Administered 2018-02-14: 5 mL via INTRAVENOUS

## 2018-02-14 MED ORDER — ONDANSETRON HCL 4 MG/2ML IJ SOLN
INTRAMUSCULAR | Status: DC | PRN
  Administered 2018-02-14 (×2): 4 mg via INTRAVENOUS

## 2018-02-14 MED ORDER — PHENYLEPHRINE 40 MCG/ML (10ML) SYRINGE FOR IV PUSH (FOR BLOOD PRESSURE SUPPORT)
PREFILLED_SYRINGE | INTRAVENOUS | Status: DC | PRN
  Administered 2018-02-14 (×5): 80 ug via INTRAVENOUS

## 2018-02-14 MED ORDER — HYDROMORPHONE HCL 1 MG/ML IJ SOLN
0.5000 mg | INTRAMUSCULAR | Status: AC | PRN
  Administered 2018-02-14 (×4): 0.5 mg via INTRAVENOUS

## 2018-02-14 MED ORDER — OXYCODONE HCL 5 MG PO TABS: 5.0000 mg | ORAL_TABLET | Freq: Once | ORAL | Status: DC | PRN

## 2018-02-14 MED ORDER — SUGAMMADEX SODIUM 500 MG/5ML IV SOLN
INTRAVENOUS | Status: AC
  Filled 2018-02-14: qty 5

## 2018-02-14 MED ORDER — SODIUM CHLORIDE 0.9 % IV BOLUS
1000.0000 mL | Freq: Once | INTRAVENOUS | Status: AC
  Administered 2018-02-14: 1000 mL via INTRAVENOUS

## 2018-02-14 MED ORDER — EPHEDRINE SULFATE-NACL 50-0.9 MG/10ML-% IV SOSY
PREFILLED_SYRINGE | INTRAVENOUS | Status: DC | PRN
  Administered 2018-02-14: 10 mg via INTRAVENOUS

## 2018-02-14 MED ORDER — PROPOFOL 10 MG/ML IV BOLUS
INTRAVENOUS | Status: AC
  Filled 2018-02-14: qty 20

## 2018-02-14 MED ORDER — HEPARIN SODIUM (PORCINE) 1000 UNIT/ML IJ SOLN
INTRAMUSCULAR | Status: AC
  Filled 2018-02-14: qty 1

## 2018-02-14 MED ORDER — SUGAMMADEX SODIUM 200 MG/2ML IV SOLN
INTRAVENOUS | Status: DC | PRN
  Administered 2018-02-14: 400 mg via INTRAVENOUS

## 2018-02-14 MED ORDER — DOCUSATE SODIUM 100 MG PO CAPS
100.0000 mg | ORAL_CAPSULE | Freq: Two times a day (BID) | ORAL | Status: DC
  Administered 2018-02-14 – 2018-02-15 (×3): 100 mg via ORAL
  Filled 2018-02-14 (×3): qty 1

## 2018-02-14 MED ORDER — TRAMADOL HCL 50 MG PO TABS: 50.0000 mg | ORAL_TABLET | Freq: Four times a day (QID) | ORAL | 0 refills | Status: DC | PRN

## 2018-02-14 MED ORDER — LIDOCAINE 2% (20 MG/ML) 5 ML SYRINGE
INTRAMUSCULAR | Status: AC
  Filled 2018-02-14: qty 5

## 2018-02-14 MED ORDER — PROPOFOL 10 MG/ML IV BOLUS
INTRAVENOUS | Status: DC | PRN
  Administered 2018-02-14: 150 mg via INTRAVENOUS

## 2018-02-14 MED ORDER — HYDROMORPHONE HCL 1 MG/ML IJ SOLN
INTRAMUSCULAR | Status: AC
  Filled 2018-02-14: qty 1

## 2018-02-14 MED ORDER — MIDAZOLAM HCL 5 MG/5ML IJ SOLN
INTRAMUSCULAR | Status: DC | PRN
  Administered 2018-02-14: 2 mg via INTRAVENOUS

## 2018-02-14 MED ORDER — DIPHENHYDRAMINE HCL 50 MG/ML IJ SOLN: 12.5000 mg | Freq: Four times a day (QID) | INTRAMUSCULAR | Status: DC | PRN

## 2018-02-14 MED ORDER — FENTANYL CITRATE (PF) 100 MCG/2ML IJ SOLN
INTRAMUSCULAR | Status: AC
  Filled 2018-02-14: qty 2

## 2018-02-14 MED ORDER — INDIGOTINDISULFONATE SODIUM 8 MG/ML IJ SOLN
INTRAMUSCULAR | Status: AC
  Filled 2018-02-14: qty 5

## 2018-02-14 MED ORDER — ONDANSETRON HCL 4 MG/2ML IJ SOLN
4.0000 mg | Freq: Four times a day (QID) | INTRAMUSCULAR | Status: DC | PRN
  Administered 2018-02-14: 4 mg via INTRAVENOUS

## 2018-02-14 MED ORDER — LACTATED RINGERS IV SOLN
INTRAVENOUS | Status: DC | PRN
  Administered 2018-02-14: 08:00:00

## 2018-02-14 MED ORDER — DIPHENHYDRAMINE HCL 12.5 MG/5ML PO ELIX: 12.5000 mg | ORAL_SOLUTION | Freq: Four times a day (QID) | ORAL | Status: DC | PRN

## 2018-02-14 MED ORDER — CEFAZOLIN SODIUM-DEXTROSE 1-4 GM/50ML-% IV SOLN
1.0000 g | Freq: Three times a day (TID) | INTRAVENOUS | Status: AC
  Administered 2018-02-14 (×2): 1 g via INTRAVENOUS
  Filled 2018-02-14 (×2): qty 50

## 2018-02-14 MED ORDER — ASPIRIN 81 MG PO CHEW
81.0000 mg | CHEWABLE_TABLET | Freq: Every day | ORAL | Status: DC
  Administered 2018-02-15: 81 mg via ORAL
  Filled 2018-02-14: qty 1

## 2018-02-14 MED ORDER — SODIUM CHLORIDE 0.9 % IR SOLN
Status: DC | PRN
  Administered 2018-02-14: 1000 mL

## 2018-02-14 MED ORDER — ATORVASTATIN CALCIUM 40 MG PO TABS
40.0000 mg | ORAL_TABLET | Freq: Every day | ORAL | Status: DC
  Administered 2018-02-14 – 2018-02-15 (×2): 40 mg via ORAL
  Filled 2018-02-14 (×2): qty 1

## 2018-02-14 MED ORDER — ONDANSETRON HCL 4 MG/2ML IJ SOLN
INTRAMUSCULAR | Status: AC
  Filled 2018-02-14: qty 2

## 2018-02-14 MED ORDER — MORPHINE SULFATE (PF) 2 MG/ML IV SOLN
2.0000 mg | INTRAVENOUS | Status: DC | PRN
  Administered 2018-02-14 (×3): 2 mg via INTRAVENOUS
  Filled 2018-02-14 (×3): qty 1

## 2018-02-14 MED ORDER — AMLODIPINE BESYLATE 10 MG PO TABS
10.0000 mg | ORAL_TABLET | Freq: Every day | ORAL | Status: DC
  Administered 2018-02-15: 10 mg via ORAL
  Filled 2018-02-14: qty 1

## 2018-02-14 MED ORDER — DEXAMETHASONE SODIUM PHOSPHATE 10 MG/ML IJ SOLN
INTRAMUSCULAR | Status: AC
  Filled 2018-02-14: qty 1

## 2018-02-14 MED ORDER — SULFAMETHOXAZOLE-TRIMETHOPRIM 800-160 MG PO TABS: 1.0000 | ORAL_TABLET | Freq: Two times a day (BID) | ORAL | 0 refills | Status: DC

## 2018-02-14 MED ORDER — FENTANYL CITRATE (PF) 250 MCG/5ML IJ SOLN
INTRAMUSCULAR | Status: AC
  Filled 2018-02-14: qty 5

## 2018-02-14 MED ORDER — OXYCODONE HCL 5 MG/5ML PO SOLN
5.0000 mg | Freq: Once | ORAL | Status: DC | PRN
  Filled 2018-02-14: qty 5

## 2018-02-14 MED ORDER — DOCUSATE SODIUM 100 MG PO CAPS: 100.0000 mg | ORAL_CAPSULE | Freq: Every day | ORAL | Status: DC | PRN

## 2018-02-14 MED ORDER — ROCURONIUM BROMIDE 10 MG/ML (PF) SYRINGE
PREFILLED_SYRINGE | INTRAVENOUS | Status: AC
  Filled 2018-02-14: qty 10

## 2018-02-14 MED ORDER — CEFAZOLIN SODIUM-DEXTROSE 2-4 GM/100ML-% IV SOLN
2.0000 g | Freq: Once | INTRAVENOUS | Status: AC
  Administered 2018-02-14: 2 g via INTRAVENOUS
  Filled 2018-02-14: qty 100

## 2018-02-14 MED ORDER — TIZANIDINE HCL 4 MG PO TABS: 2.0000 mg | ORAL_TABLET | Freq: Three times a day (TID) | ORAL | Status: DC | PRN

## 2018-02-14 MED ORDER — BUPIVACAINE-EPINEPHRINE (PF) 0.25% -1:200000 IJ SOLN
INTRAMUSCULAR | Status: AC
  Filled 2018-02-14: qty 30

## 2018-02-14 MED ORDER — FENTANYL CITRATE (PF) 250 MCG/5ML IJ SOLN
INTRAMUSCULAR | Status: DC | PRN
  Administered 2018-02-14 (×3): 50 ug via INTRAVENOUS
  Administered 2018-02-14: 100 ug via INTRAVENOUS

## 2018-02-14 MED ORDER — METOPROLOL SUCCINATE ER 50 MG PO TB24
50.0000 mg | ORAL_TABLET | Freq: Every day | ORAL | Status: DC
  Administered 2018-02-15: 50 mg via ORAL
  Filled 2018-02-14: qty 1

## 2018-02-14 MED ORDER — KCL IN DEXTROSE-NACL 20-5-0.45 MEQ/L-%-% IV SOLN
INTRAVENOUS | Status: DC
  Administered 2018-02-14 – 2018-02-15 (×3): via INTRAVENOUS
  Filled 2018-02-14 (×3): qty 1000

## 2018-02-14 MED ORDER — MIDAZOLAM HCL 2 MG/2ML IJ SOLN
INTRAMUSCULAR | Status: AC
  Filled 2018-02-14: qty 2

## 2018-02-14 MED ORDER — BUPIVACAINE-EPINEPHRINE 0.25% -1:200000 IJ SOLN
INTRAMUSCULAR | Status: DC | PRN
  Administered 2018-02-14: 30 mL

## 2018-02-14 MED ORDER — LACTATED RINGERS IV SOLN
INTRAVENOUS | Status: DC
  Administered 2018-02-14: 1000 mL via INTRAVENOUS
  Administered 2018-02-14: 10:00:00 via INTRAVENOUS

## 2018-02-14 MED ORDER — NITROGLYCERIN 0.4 MG SL SUBL: 0.4000 mg | SUBLINGUAL_TABLET | SUBLINGUAL | Status: DC | PRN

## 2018-02-14 MED ORDER — ACETAMINOPHEN 325 MG PO TABS
650.0000 mg | ORAL_TABLET | ORAL | Status: DC | PRN
  Filled 2018-02-14: qty 2

## 2018-02-14 MED ORDER — EPHEDRINE 5 MG/ML INJ
INTRAVENOUS | Status: AC
  Filled 2018-02-14: qty 10

## 2018-02-14 MED ORDER — LIDOCAINE 2% (20 MG/ML) 5 ML SYRINGE
INTRAMUSCULAR | Status: DC | PRN
  Administered 2018-02-14: 80 mg via INTRAVENOUS

## 2018-02-14 SURGICAL SUPPLY — 52 items
APPLICATOR COTTON TIP 6IN STRL (MISCELLANEOUS) ×3 IMPLANT
CATH FOLEY 2WAY SLVR 18FR 30CC (CATHETERS) ×3 IMPLANT
CATH ROBINSON RED A/P 16FR (CATHETERS) ×3 IMPLANT
CATH ROBINSON RED A/P 8FR (CATHETERS) ×3 IMPLANT
CATH TIEMANN FOLEY 18FR 5CC (CATHETERS) ×3 IMPLANT
CHLORAPREP W/TINT 26ML (MISCELLANEOUS) ×3 IMPLANT
CLIP VESOLOCK LG 6/CT PURPLE (CLIP) ×12 IMPLANT
COVER SURGICAL LIGHT HANDLE (MISCELLANEOUS) ×3 IMPLANT
COVER TIP SHEARS 8 DVNC (MISCELLANEOUS) ×2 IMPLANT
COVER TIP SHEARS 8MM DA VINCI (MISCELLANEOUS) ×1
CUTTER ECHEON FLEX ENDO 45 340 (ENDOMECHANICALS) ×3 IMPLANT
DECANTER SPIKE VIAL GLASS SM (MISCELLANEOUS) IMPLANT
DERMABOND ADVANCED (GAUZE/BANDAGES/DRESSINGS)
DERMABOND ADVANCED .7 DNX12 (GAUZE/BANDAGES/DRESSINGS) IMPLANT
DRAPE ARM DVNC X/XI (DISPOSABLE) ×8 IMPLANT
DRAPE COLUMN DVNC XI (DISPOSABLE) ×2 IMPLANT
DRAPE DA VINCI XI ARM (DISPOSABLE) ×4
DRAPE DA VINCI XI COLUMN (DISPOSABLE) ×1
DRAPE SURG IRRIG POUCH 19X23 (DRAPES) ×3 IMPLANT
DRSG TEGADERM 4X4.75 (GAUZE/BANDAGES/DRESSINGS) ×3 IMPLANT
ELECT REM PT RETURN 15FT ADLT (MISCELLANEOUS) ×3 IMPLANT
GLOVE BIO SURGEON STRL SZ 6.5 (GLOVE) ×3 IMPLANT
GLOVE BIOGEL M STRL SZ7.5 (GLOVE) ×6 IMPLANT
GOWN STRL REUS W/TWL LRG LVL3 (GOWN DISPOSABLE) ×9 IMPLANT
HOLDER FOLEY CATH W/STRAP (MISCELLANEOUS) ×3 IMPLANT
IRRIG SUCT STRYKERFLOW 2 WTIP (MISCELLANEOUS) ×3
IRRIGATION SUCT STRKRFLW 2 WTP (MISCELLANEOUS) ×2 IMPLANT
IV LACTATED RINGERS 1000ML (IV SOLUTION) IMPLANT
NDL SAFETY ECLIPSE 18X1.5 (NEEDLE) ×2 IMPLANT
NEEDLE HYPO 18GX1.5 SHARP (NEEDLE) ×1
PACK ROBOT UROLOGY CUSTOM (CUSTOM PROCEDURE TRAY) ×3 IMPLANT
SEAL CANN UNIV 5-8 DVNC XI (MISCELLANEOUS) ×8 IMPLANT
SEAL XI 5MM-8MM UNIVERSAL (MISCELLANEOUS) ×4
SOLUTION ELECTROLUBE (MISCELLANEOUS) ×3 IMPLANT
STAPLE RELOAD 45 GRN (STAPLE) ×2 IMPLANT
STAPLE RELOAD 45MM GREEN (STAPLE) ×1
SUT ETHILON 3 0 PS 1 (SUTURE) ×3 IMPLANT
SUT MNCRL 3 0 RB1 (SUTURE) ×2 IMPLANT
SUT MNCRL 3 0 VIOLET RB1 (SUTURE) ×2 IMPLANT
SUT MNCRL AB 4-0 PS2 18 (SUTURE) ×6 IMPLANT
SUT MONOCRYL 3 0 RB1 (SUTURE) ×2
SUT VIC AB 0 CT1 27 (SUTURE) ×1
SUT VIC AB 0 CT1 27XBRD ANTBC (SUTURE) ×2 IMPLANT
SUT VIC AB 0 UR5 27 (SUTURE) ×3 IMPLANT
SUT VIC AB 2-0 SH 27 (SUTURE) ×1
SUT VIC AB 2-0 SH 27X BRD (SUTURE) ×2 IMPLANT
SUT VICRYL 0 UR6 27IN ABS (SUTURE) ×6 IMPLANT
SYR 27GX1/2 1ML LL SAFETY (SYRINGE) ×3 IMPLANT
TOWEL OR 17X26 10 PK STRL BLUE (TOWEL DISPOSABLE) ×3 IMPLANT
TOWEL OR NON WOVEN STRL DISP B (DISPOSABLE) ×3 IMPLANT
TUBING INSUFFLATION 10FT LAP (TUBING) IMPLANT
WATER STERILE IRR 1000ML POUR (IV SOLUTION) IMPLANT

## 2018-02-14 NOTE — Discharge Instructions (Signed)

## 2018-02-14 NOTE — Plan of Care (Signed)
Patient admitted to [place] post robotic prostatectomy.  Patient with postop pain that improved after several doses of Morphine and scheduled Toradol.  Tolerating clear liquids.  Up to chair several times during shift.  Wife at bedside.

## 2018-02-14 NOTE — H&P (Deleted)
Preoperative diagnosis: Clinically localized adenocarcinoma of the prostate (clinical stage T1c N0 M0)  Postoperative diagnosis: Clinically localized adenocarcinoma of the prostate (clinical stage T1c N0 M0)  Procedure:  1. Robotic assisted laparoscopic radical prostatectomy (left nerve sparing) 2. Bilateral robotic assisted laparoscopic extended pelvic lymphadenectomy  Surgeon: Pryor Curia. M.D.  Assistant(s): Debbrah Alar, PA-C  Anesthesia: General  Complications: None  EBL: 200 mL  IVF:  1200 mL crystalloid  Specimens: 1. Prostate and seminal vesicles 2. Right pelvic lymph nodes 3. Left pelvic lymph nodes  Disposition of specimens: Pathology  Drains: 1. 20 Fr coude catheter 2. # 19 Blake pelvic drain  Indication: Edward [name] is a 67 y.o. patient with clinically localized prostate cancer.  After a thorough review of the management options for treatment of prostate cancer, he elected to proceed with surgical therapy and the above procedure(s).  We have discussed the potential benefits and risks of the procedure, side effects of the proposed treatment, the likelihood of the patient achieving the goals of the procedure, and any potential problems that might occur during the procedure or recuperation. Informed consent has been obtained.  Description of procedure:  The patient was taken to the operating room and a general anesthetic was administered. He was given preoperative antibiotics, placed in the dorsal lithotomy position, and prepped and draped in the usual sterile fashion. Next a preoperative timeout was performed. A urethral catheter was placed into the bladder and a site was selected near the umbilicus for placement of the camera port. This was placed using a standard open Hassan technique which allowed entry into the peritoneal cavity under direct vision and without difficulty. An 8 mm port was placed and a pneumoperitoneum established. The camera was then  used to inspect the abdomen and there was no evidence of any intra-abdominal injuries or other abnormalities. The remaining abdominal ports were then placed. 8 mm robotic ports were placed in the right lower quadrant, left lower quadrant, and far left lateral abdominal wall. A 5 mm port was placed in the right upper quadrant and a 12 mm port was placed in the right lateral abdominal wall for laparoscopic assistance. All ports were placed under direct vision without difficulty. The surgical cart was then docked.   Utilizing the cautery scissors, the bladder was reflected posteriorly allowing entry into the space of Retzius and identification of the endopelvic fascia and prostate. The periprostatic fat was then removed from the prostate allowing full exposure of the endopelvic fascia. The endopelvic fascia was then incised from the apex back to the base of the prostate bilaterally and the underlying levator muscle fibers were swept laterally off the prostate thereby isolating the dorsal venous complex. The dorsal vein was then stapled and divided with a 45 mm Flex Echelon stapler. Attention then turned to the bladder neck which was divided anteriorly thereby allowing entry into the bladder and exposure of the urethral catheter. The catheter balloon was deflated and the catheter was brought into the operative field and used to retract the prostate anteriorly. The posterior bladder neck was then examined and was divided allowing further dissection between the bladder and prostate posteriorly until the vasa deferentia and seminal vessels were identified.  During the posterior bladder neck dissection a small entry into the bladder was noted.  Indigo carmine was administered and the ureteral orifices were identified and were well away from this area.  This was repaired with a 3-0 vicryl suture in two layers. The vasa deferentia were isolated,  divided, and lifted anteriorly. The seminal vesicles were dissected down to  their tips with care to control the seminal vascular arterial blood supply. These structures were then lifted anteriorly and the space between Denonvillier's fascia and the anterior rectum was developed with a combination of sharp and blunt dissection. This isolated the vascular pedicles of the prostate.  The lateral prostatic fascia on the left side of the prostate was then sharply incised allowing release of the neurovascular bundle. The vascular pedicle of the prostate on the left side was then ligated with Weck clips between the prostate and neurovascular bundle and divided with sharp cold scissor dissection resulting in neurovascular bundle preservation. On the right side, a wide non nerve sparing dissection was performed with Weck clips used to ligate the vascular pedicle of the prostate. The neurovascular bundle on the left side was then separated off the apex of the prostate and urethra.  The urethra was then sharply transected allowing the prostate specimen to be disarticulated. The pelvis was copiously irrigated and hemostasis was ensured. There was no evidence for rectal injury.  However, there was an area on the right side of the rectum that did appear to be slightly thin.  Therefore, I reapproximated tissue over this area as a precaution with 2-0 silk figure of eight sutures.  Attention then turned to the right pelvic sidewall. The fibrofatty tissue between the genitofemoral nerve, confluence of the iliac vessels, hypogastric artery, and Cooper's ligament was dissected free from the pelvic sidewall with care to preserve the obturator nerve. Weck clips were used for lymphostasis and hemostasis. An identical procedure was performed on the contralateral side and the lymphatic packets were removed for permanent pathologic analysis.  Attention then turned to the urethral anastomosis. A 2-0 Vicryl slip knot was placed between Denonvillier's fascia, the posterior bladder neck, and the posterior urethra  to reapproximate these structures. A double-armed 3-0 Monocryl suture was then used to perform a 360 running tension-free anastomosis between the bladder neck and urethra. A new urethral catheter was then placed into the bladder and irrigated. There were no blood clots within the bladder and the anastomosis appeared to be watertight. A #19 Blake drain was then brought through the left lateral 8 mm port site and positioned appropriately within the pelvis. It was secured to the skin with a nylon suture. The surgical cart was then undocked. The right lateral 12 mm port site was closed at the fascial level with a 0 Vicryl suture placed laparoscopically. All remaining ports were then removed under direct vision. The prostate specimen was removed intact within the Endopouch retrieval bag via the periumbilical camera port site. This fascial opening was closed with two running 0 Vicryl sutures. 0.25% Marcaine was then injected into all port sites and all incisions were reapproximated at the skin level with 4-0 Monocryl subcuticular sutures and Dermabond. The patient appeared to tolerate the procedure well and without complications. The patient was able to be extubated and transferred to the recovery unit in satisfactory condition.   Pryor Curia MD

## 2018-02-14 NOTE — Op Note (Signed)
Preoperative diagnosis: Clinically localized adenocarcinoma of the prostate (clinical stage T1c N0 M0)  Postoperative diagnosis: Clinically localized adenocarcinoma of the prostate (clinical stage T1c N0 M0)  Procedure:  1. Robotic assisted laparoscopic radical prostatectomy (left nerve sparing) 2. Bilateral robotic assisted laparoscopic extended pelvic lymphadenectomy  Surgeon: Pryor Curia. M.D.  Assistant(s): Debbrah Alar, PA-C  Anesthesia: General  Complications: None  EBL: 200 mL  IVF:  1200 mL crystalloid  Specimens: 1. Prostate and seminal vesicles 2. Right pelvic lymph nodes 3. Left pelvic lymph nodes  Disposition of specimens: Pathology  Drains: 1. 20 Fr coude catheter 2. # 19 Blake pelvic drain  Indication: Edward [name] is a 67 y.o. patient with clinically localized prostate cancer.  After a thorough review of the management options for treatment of prostate cancer, he elected to proceed with surgical therapy and the above procedure(s).  We have discussed the potential benefits and risks of the procedure, side effects of the proposed treatment, the likelihood of the patient achieving the goals of the procedure, and any potential problems that might occur during the procedure or recuperation. Informed consent has been obtained.  Description of procedure:  The patient was taken to the operating room and a general anesthetic was administered. He was given preoperative antibiotics, placed in the dorsal lithotomy position, and prepped and draped in the usual sterile fashion. Next a preoperative timeout was performed. A urethral catheter was placed into the bladder and a site was selected near the umbilicus for placement of the camera port. This was placed using a standard open Hassan technique which allowed entry into the peritoneal cavity under direct vision and without difficulty. An 8 mm port was placed and a pneumoperitoneum established. The  camera was then used to inspect the abdomen and there was no evidence of any intra-abdominal injuries or other abnormalities. The remaining abdominal ports were then placed. 8 mm robotic ports were placed in the right lower quadrant, left lower quadrant, and far left lateral abdominal wall. A 5 mm port was placed in the right upper quadrant and a 12 mm port was placed in the right lateral abdominal wall for laparoscopic assistance. All ports were placed under direct vision without difficulty. The surgical cart was then docked.   Utilizing the cautery scissors, the bladder was reflected posteriorly allowing entry into the space of Retzius and identification of the endopelvic fascia and prostate. The periprostatic fat was then removed from the prostate allowing full exposure of the endopelvic fascia. The endopelvic fascia was then incised from the apex back to the base of the prostate bilaterally and the underlying levator muscle fibers were swept laterally off the prostate thereby isolating the dorsal venous complex. The dorsal vein was then stapled and divided with a 45 mm Flex Echelon stapler. Attention then turned to the bladder neck which was divided anteriorly thereby allowing entry into the bladder and exposure of the urethral catheter. The catheter balloon was deflated and the catheter was brought into the operative field and used to retract the prostate anteriorly. The posterior bladder neck was then examined and was divided allowing further dissection between the bladder and prostate posteriorly until the vasa deferentia and seminal vessels were identified.  During the posterior bladder neck dissection a small entry into the bladder was noted.  Indigo carmine was administered and the ureteral orifices were identified and were well away from this area.  This was repaired with a 3-0 vicryl suture in two layers. The vasa deferentia were isolated,  divided, and lifted anteriorly. The seminal vesicles were  dissected down to their tips with care to control the seminal vascular arterial blood supply. These structures were then lifted anteriorly and the space between Denonvillier's fascia and the anterior rectum was developed with a combination of sharp and blunt dissection. This isolated the vascular pedicles of the prostate.  The lateral prostatic fascia on the left side of the prostate was then sharply incised allowing release of the neurovascular bundle. The vascular pedicle of the prostate on the left side was then ligated with Weck clips between the prostate and neurovascular bundle and divided with sharp cold scissor dissection resulting in neurovascular bundle preservation. On the right side, a wide non nerve sparing dissection was performed with Weck clips used to ligate the vascular pedicle of the prostate. The neurovascular bundle on the left side was then separated off the apex of the prostate and urethra.  The urethra was then sharply transected allowing the prostate specimen to be disarticulated. The pelvis was copiously irrigated and hemostasis was ensured. There was no evidence for rectal injury.  However, there was an area on the right side of the rectum that did appear to be slightly thin.  Therefore, I reapproximated tissue over this area as a precaution with 2-0 silk figure of eight sutures.  Attention then turned to the right pelvic sidewall. The fibrofatty tissue between the genitofemoral nerve, confluence of the iliac vessels, hypogastric artery, and Cooper's ligament was dissected free from the pelvic sidewall with care to preserve the obturator nerve. Weck clips were used for lymphostasis and hemostasis. An identical procedure was performed on the contralateral side and the lymphatic packets were removed for permanent pathologic analysis.  Attention then turned to the urethral anastomosis. A 2-0 Vicryl slip knot was placed between Denonvillier's fascia, the posterior bladder neck, and  the posterior urethra to reapproximate these structures. A double-armed 3-0 Monocryl suture was then used to perform a 360 running tension-free anastomosis between the bladder neck and urethra. A new urethral catheter was then placed into the bladder and irrigated. There were no blood clots within the bladder and the anastomosis appeared to be watertight. A #19 Blake drain was then brought through the left lateral 8 mm port site and positioned appropriately within the pelvis. It was secured to the skin with a nylon suture. The surgical cart was then undocked. The right lateral 12 mm port site was closed at the fascial level with a 0 Vicryl suture placed laparoscopically. All remaining ports were then removed under direct vision. The prostate specimen was removed intact within the Endopouch retrieval bag via the periumbilical camera port site. This fascial opening was closed with two running 0 Vicryl sutures. 0.25% Marcaine was then injected into all port sites and all incisions were reapproximated at the skin level with 4-0 Monocryl subcuticular sutures and Dermabond. The patient appeared to tolerate the procedure well and without complications. The patient was able to be extubated and transferred to the recovery unit in satisfactory condition.   Pryor Curia MD

## 2018-02-14 NOTE — Anesthesia Procedure Notes (Signed)
Procedure Name: Intubation Date/Time: 02/14/2018 7:31 AM Performed by: Mitzie Na, CRNA Pre-anesthesia Checklist: Patient identified, Emergency Drugs available, Suction available, Patient being monitored and Timeout performed Patient Re-evaluated:Patient Re-evaluated prior to induction Oxygen Delivery Method: Circle system utilized Preoxygenation: Pre-oxygenation with 100% oxygen Induction Type: IV induction Ventilation: Mask ventilation without difficulty Laryngoscope Size: Mac and 4 Grade View: Grade I Tube type: Oral Tube size: 7.5 mm Number of attempts: 1 Airway Equipment and Method: Stylet Placement Confirmation: ETT inserted through vocal cords under direct vision,  positive ETCO2 and breath sounds checked- equal and bilateral Secured at: 24 cm Tube secured with: Tape Dental Injury: Teeth and Oropharynx as per pre-operative assessment

## 2018-02-14 NOTE — Anesthesia Preprocedure Evaluation (Addendum)
Anesthesia Evaluation  Patient identified by MRN, date of birth, ID band Patient awake    Reviewed: Allergy & Precautions, H&P , NPO status , Patient's Chart, lab work & pertinent test results  Airway Mallampati: II   Neck ROM: full    Dental   Pulmonary former smoker,    breath sounds clear to auscultation       Cardiovascular hypertension, + CAD, + Past MI, + Cardiac Stents and + Peripheral Vascular Disease   Rhythm:regular Rate:Normal  MI 1999. PCI. Myoview 2010 no ischemia. Currently denies CP.  Dr Acie Fredrickson cardiologist   Neuro/Psych    GI/Hepatic   Endo/Other    Renal/GU      Musculoskeletal   Abdominal   Peds  Hematology   Anesthesia Other Findings   Reproductive/Obstetrics                            Anesthesia Physical Anesthesia Plan  ASA: III  Anesthesia Plan: General   Post-op Pain Management:    Induction: Intravenous  PONV Risk Score and Plan: 2 and Ondansetron, Dexamethasone, Midazolam and Treatment may vary due to age or medical condition  Airway Management Planned: Oral ETT  Additional Equipment:   Intra-op Plan:   Post-operative Plan: Extubation in OR  Informed Consent: I have reviewed the patients History and Physical, chart, labs and discussed the procedure including the risks, benefits and alternatives for the proposed anesthesia with the patient or authorized representative who has indicated his/her understanding and acceptance.     Plan Discussed with: CRNA and Anesthesiologist  Anesthesia Plan Comments:         Anesthesia Quick Evaluation

## 2018-02-14 NOTE — Transfer of Care (Signed)
Immediate Anesthesia Transfer of Care Note  Patient: Edward [name]  Procedure(s) Performed: XI ROBOTIC ASSISTED LAPAROSCOPIC RADICAL PROSTATECTOMY (N/A ) LYMPHADENECTOMY,PELVIC (Bilateral )  Patient Location: PACU  Anesthesia Type:General  Level of Consciousness: awake, alert , oriented and patient cooperative  Airway & Oxygen Therapy: Patient Spontanous Breathing and Patient connected to face mask oxygen  Post-op Assessment: Report given to RN, Post -op Vital signs reviewed and stable and Patient moving all extremities  Post vital signs: Reviewed and stable  Last Vitals:  Vitals Value Taken Time  BP 124/76 02/14/2018 10:45 AM  Temp    Pulse 88 02/14/2018 10:46 AM  Resp 19 02/14/2018 10:46 AM  SpO2 100 % 02/14/2018 10:46 AM  Vitals shown include unvalidated device data.  Last Pain:  Vitals:   02/14/18 0550  TempSrc: Oral      Patients Stated Pain Goal: 4 (61/51/83 4373)  Complications: No apparent anesthesia complications

## 2018-02-14 NOTE — Progress Notes (Signed)
Post-op note  Subjective: The patient is doing well.  No complaints.  Denies N/V.  Objective: Vital signs in last 24 hours: Temp:  [97.6 F (36.4 C)-97.8 F (36.6 C)] 97.6 F (36.4 C) (07/18 1156) Pulse Rate:  [57-89] 86 (07/18 1156) Resp:  [13-19] 19 (07/18 1145) BP: (116-148)/(76-92) 116/80 (07/18 1156) SpO2:  [95 %-100 %] 95 % (07/18 1439) Weight:  [91.2 kg (201 lb)] 91.2 kg (201 lb) (07/18 0648)  Intake/Output from previous day: No intake/output data recorded. Intake/Output this shift: Total I/O In: 2300 [I.V.:1200; IV Piggyback:1100] Out: 1050 [Urine:775; Drains:125; Blood:150]  Physical Exam:  General: Alert and oriented. Abdomen: Soft, Nondistended. Incisions: Clean and dry. Urine: pink  Lab Results: Recent Labs    02/14/18 1131  HGB 14.2  HCT 40.6    Assessment/Plan: POD#0   1) Continue to monitor  2) DVT prophy, clears, IS, amb, pain control   LOS: 0 days   Debbrah Alar 02/14/2018, 3:07 PM

## 2018-02-15 DIAGNOSIS — I2510 Atherosclerotic heart disease of native coronary artery without angina pectoris: Secondary | ICD-10-CM | POA: Diagnosis not present

## 2018-02-15 DIAGNOSIS — I739 Peripheral vascular disease, unspecified: Secondary | ICD-10-CM | POA: Diagnosis not present

## 2018-02-15 DIAGNOSIS — C61 Malignant neoplasm of prostate: Secondary | ICD-10-CM | POA: Diagnosis not present

## 2018-02-15 DIAGNOSIS — C775 Secondary and unspecified malignant neoplasm of intrapelvic lymph nodes: Secondary | ICD-10-CM | POA: Diagnosis not present

## 2018-02-15 DIAGNOSIS — Z79899 Other long term (current) drug therapy: Secondary | ICD-10-CM | POA: Diagnosis not present

## 2018-02-15 DIAGNOSIS — E7800 Pure hypercholesterolemia, unspecified: Secondary | ICD-10-CM | POA: Diagnosis not present

## 2018-02-15 DIAGNOSIS — Z87891 Personal history of nicotine dependence: Secondary | ICD-10-CM | POA: Diagnosis not present

## 2018-02-15 DIAGNOSIS — I252 Old myocardial infarction: Secondary | ICD-10-CM | POA: Diagnosis not present

## 2018-02-15 DIAGNOSIS — I10 Essential (primary) hypertension: Secondary | ICD-10-CM | POA: Diagnosis not present

## 2018-02-15 LAB — HEMOGLOBIN AND HEMATOCRIT, BLOOD
HCT: 38.3 % — ABNORMAL LOW (ref 39.0–52.0)
Hemoglobin: 13.4 g/dL (ref 13.0–17.0)

## 2018-02-15 MED ORDER — TRAMADOL HCL 50 MG PO TABS
50.0000 mg | ORAL_TABLET | Freq: Four times a day (QID) | ORAL | Status: DC | PRN
  Administered 2018-02-15: 50 mg via ORAL
  Filled 2018-02-15: qty 1

## 2018-02-15 NOTE — Progress Notes (Signed)
Reviewed discharge information with patient and wife. Answered all questions. Patient/wife able to teach back medications and reasons to contact MD/911. Patient verbalizes importance of PCP follow up appointment. Patient and wife demonstrate proper foley care and how to change between leg and standard drainage bag.  Barbee Shropshire. Brigitte Pulse, RN

## 2018-02-15 NOTE — Discharge Summary (Signed)
  Date of admission: 02/14/2018  Date of discharge: 02/15/2018  Admission diagnosis: Prostate Cancer  Discharge diagnosis: Prostate Cancer  History and Physical: For full details, please see admission history and physical. Briefly, Edward [name] is a 67 y.o. gentleman with localized prostate cancer.  After discussing management/treatment options, he elected to proceed with surgical treatment.  Hospital Course: CALUB TARNOW was taken to the operating room on 02/14/2018 and underwent a robotic assisted laparoscopic radical prostatectomy. He tolerated this procedure well and without complications. Postoperatively, he was able to be transferred to a regular hospital room following recovery from anesthesia.  He was able to begin ambulating the night of surgery. He remained hemodynamically stable overnight.  He had excellent urine output with appropriately minimal output from his pelvic drain and his pelvic drain was removed on POD #1.  He was transitioned to oral pain medication, tolerated a clear liquid diet, and had met all discharge criteria and was able to be discharged home later on POD#1.  Laboratory values:  Recent Labs    02/14/18 1131 02/15/18 0418  HGB 14.2 13.4  HCT 40.6 38.3*    Disposition: Home  Discharge instruction: He was instructed to be ambulatory but to refrain from heavy lifting, strenuous activity, or driving. He was instructed on urethral catheter care.  Discharge medications:   Allergies as of 02/15/2018   No Known Allergies     Medication List    STOP taking these medications   fish oil-omega-3 fatty acids 1000 MG capsule   IRON PO   meloxicam 15 MG tablet Commonly known as:  MOBIC   multivitamin tablet   VITAMIN B-12 PO   VITAMIN D3 PO     TAKE these medications   amLODipine 10 MG tablet Commonly known as:  NORVASC Take 10 mg by mouth daily.   aspirin 81 MG tablet Take 81 mg by mouth daily.   atorvastatin 40 MG tablet Commonly known as:   LIPITOR Take 1 tablet (40 mg total) by mouth daily.   docusate sodium 100 MG capsule Commonly known as:  COLACE Take 100 mg by mouth daily as needed for mild constipation.   lisinopril 20 MG tablet Commonly known as:  PRINIVIL,ZESTRIL Take 1 tablet (20 mg total) by mouth daily.   metoprolol succinate 50 MG 24 hr tablet Commonly known as:  TOPROL-XL Take 50 mg by mouth daily. Take with or immediately following a meal.   nitroGLYCERIN 0.4 MG SL tablet Commonly known as:  NITROSTAT place 1 tablet under the tongue if needed for chest pain What changed:    how much to take  how to take this  when to take this  reasons to take this  additional instructions   sulfamethoxazole-trimethoprim 800-160 MG tablet Commonly known as:  BACTRIM DS,SEPTRA DS Take 1 tablet by mouth 2 (two) times daily. Start the day prior to foley removal appointment   tiZANidine 2 MG tablet Commonly known as:  ZANAFLEX Take 2 mg by mouth every 8 (eight) hours as needed for muscle spasms.   traMADol 50 MG tablet Commonly known as:  ULTRAM Take 1-2 tablets (50-100 mg total) by mouth every 6 (six) hours as needed for moderate pain or severe pain.       Followup: He will followup in 1 week for catheter removal and to discuss his surgical pathology results.

## 2018-02-15 NOTE — Anesthesia Postprocedure Evaluation (Signed)
Anesthesia Post Note  Patient: Edward [name]  Procedure(s) Performed: XI ROBOTIC ASSISTED LAPAROSCOPIC RADICAL PROSTATECTOMY (N/A ) LYMPHADENECTOMY,PELVIC (Bilateral )     Patient location during evaluation: PACU Anesthesia Type: General Level of consciousness: awake and alert Pain management: pain level controlled Vital Signs Assessment: post-procedure vital signs reviewed and stable Respiratory status: spontaneous breathing, nonlabored ventilation, respiratory function stable and patient connected to nasal cannula oxygen Cardiovascular status: blood pressure returned to baseline and stable Postop Assessment: no apparent nausea or vomiting Anesthetic complications: no    Last Vitals:  Vitals:   02/15/18 0030 02/15/18 0449  BP: 140/88 (!) 144/88  Pulse: 80 70  Resp: 18 18  Temp: 36.9 C 37.1 C  SpO2: 100% 100%    Last Pain:  Vitals:   02/15/18 0646  TempSrc:   PainSc: Asleep                 Auren Valdes S

## 2018-02-15 NOTE — Progress Notes (Signed)
Patient ID: Edward [name], male   DOB: 03-07-1951, 67 y.o.   MRN: [id number]  1 Day Post-Op Subjective: The patient is doing well.  No nausea or vomiting. Pain is adequately controlled.  Objective: Vital signs in last 24 hours: Temp:  [97.6 F (36.4 C)-98.8 F (37.1 C)] 98.7 F (37.1 C) (07/19 0449) Pulse Rate:  [70-89] 70 (07/19 0449) Resp:  [13-19] 18 (07/19 0449) BP: (116-144)/(76-92) 144/88 (07/19 0449) SpO2:  [95 %-100 %] 100 % (07/19 0449)  Intake/Output from previous day: 07/18 0701 - 07/19 0700 In: 5305 [P.O.:240; I.V.:3665; IV Piggyback:1400] Out: 6754 [Urine:3550; Drains:411; Blood:150] Intake/Output this shift: No intake/output data recorded.  Physical Exam:  General: Alert and oriented. CV: RRR Lungs: Clear bilaterally. GI: Soft, Nondistended. Incisions: Clean, dry, and intact Urine: Clear Extremities: Nontender, no erythema, no edema.  Lab Results: Recent Labs    02/14/18 1131 02/15/18 0418  HGB 14.2 13.4  HCT 40.6 38.3*      Assessment/Plan: POD# 1 s/p robotic prostatectomy.  1) SL IVF 2) Ambulate, Incentive spirometry 3) Transition to oral pain medication 4) Dulcolax suppository 5) D/C pelvic drain 6) Plan for likely discharge later today   Edward [name]. MD   LOS: 0 days   Edward [name],LES 02/15/2018, 7:27 AM

## 2018-02-22 ENCOUNTER — Emergency Department (HOSPITAL_COMMUNITY)
Admission: EM | Admit: 2018-02-22 | Discharge: 2018-02-22 | Disposition: A | Discharge status: Home / Self Care | Payer: Federal, State, Local not specified - PPO | Attending: Emergency Medicine | Admitting: Emergency Medicine

## 2018-02-22 ENCOUNTER — Other Ambulatory Visit: Payer: Self-pay

## 2018-02-22 DIAGNOSIS — Z8546 Personal history of malignant neoplasm of prostate: Secondary | ICD-10-CM | POA: Insufficient documentation

## 2018-02-22 DIAGNOSIS — I10 Essential (primary) hypertension: Secondary | ICD-10-CM | POA: Diagnosis not present

## 2018-02-22 DIAGNOSIS — E860 Dehydration: Secondary | ICD-10-CM | POA: Diagnosis not present

## 2018-02-22 DIAGNOSIS — I2510 Atherosclerotic heart disease of native coronary artery without angina pectoris: Secondary | ICD-10-CM | POA: Insufficient documentation

## 2018-02-22 DIAGNOSIS — Z79899 Other long term (current) drug therapy: Secondary | ICD-10-CM | POA: Diagnosis not present

## 2018-02-22 DIAGNOSIS — I959 Hypotension, unspecified: Secondary | ICD-10-CM | POA: Diagnosis not present

## 2018-02-22 DIAGNOSIS — I951 Orthostatic hypotension: Secondary | ICD-10-CM | POA: Diagnosis not present

## 2018-02-22 DIAGNOSIS — Z87891 Personal history of nicotine dependence: Secondary | ICD-10-CM | POA: Diagnosis not present

## 2018-02-22 LAB — COMPREHENSIVE METABOLIC PANEL
ALT: 16 U/L (ref 0–44)
AST: 16 U/L (ref 15–41)
Albumin: 3 g/dL — ABNORMAL LOW (ref 3.5–5.0)
Alkaline Phosphatase: 42 U/L (ref 38–126)
Anion gap: 7 (ref 5–15)
BUN: 12 mg/dL (ref 8–23)
CO2: 23 mmol/L (ref 22–32)
Calcium: 8.3 mg/dL — ABNORMAL LOW (ref 8.9–10.3)
Chloride: 107 mmol/L (ref 98–111)
Creatinine, Ser: 1.17 mg/dL (ref 0.61–1.24)
GFR calc Af Amer: >60 mL/min (ref >60)
GFR calc non Af Amer: >60 mL/min (ref >60)
Glucose, Bld: 91 mg/dL (ref 70–99)
Potassium: 3.6 mmol/L (ref 3.5–5.1)
Sodium: 137 mmol/L (ref 135–145)
Total Bilirubin: 0.7 mg/dL (ref 0.3–1.2)
Total Protein: 5.6 g/dL — ABNORMAL LOW (ref 6.5–8.1)

## 2018-02-22 LAB — I-STAT TROPONIN, ED: Troponin i, poc: 0.03 ng/mL (ref 0.00–0.08)

## 2018-02-22 LAB — CBC WITH DIFFERENTIAL/PLATELET
Basophils Absolute: 0 10*3/uL (ref 0.0–0.1)
Basophils Relative: 0 %
Eosinophils Absolute: 0.1 10*3/uL (ref 0.0–0.7)
Eosinophils Relative: 3 %
HCT: 36.4 % — ABNORMAL LOW (ref 39.0–52.0)
Hemoglobin: 12.7 g/dL — ABNORMAL LOW (ref 13.0–17.0)
Lymphocytes Relative: 28 %
Lymphs Abs: 1.3 10*3/uL (ref 0.7–4.0)
MCH: 32.2 pg (ref 26.0–34.0)
MCHC: 34.9 g/dL (ref 30.0–36.0)
MCV: 92.2 fL (ref 78.0–100.0)
Monocytes Absolute: 0.6 10*3/uL (ref 0.1–1.0)
Monocytes Relative: 12 %
Neutro Abs: 2.7 10*3/uL (ref 1.7–7.7)
Neutrophils Relative %: 57 %
Platelets: 241 10*3/uL (ref 150–400)
RBC: 3.95 MIL/uL — ABNORMAL LOW (ref 4.22–5.81)
RDW: 12.5 % (ref 11.5–15.5)
WBC: 4.7 10*3/uL (ref 4.0–10.5)

## 2018-02-22 MED ORDER — SODIUM CHLORIDE 0.9 % IV BOLUS
1000.0000 mL | Freq: Once | INTRAVENOUS | Status: AC
  Administered 2018-02-22: 1000 mL via INTRAVENOUS

## 2018-02-22 NOTE — ED Provider Notes (Signed)
[facility] DEPT Provider Note   CSN: [id number] Arrival date & time: 02/22/18  0623     History   Chief Complaint Chief Complaint  Patient presents with  . Hypotension    HPI Edward [name] is a 67 y.o. male.  The history is provided by the patient. No language interpreter was used.   Edward [name] is a 67 y.o. male who presents to the Emergency Department complaining of hypotension. He presents to the emergency department for evaluation of hypotension and dizziness. He is one week status post prostatectomy and presented to the urology office today for catheter removal. Prior to arriving to the office he took two tramadol. He states that when he was sitting he began to feel lightheaded and dizzy and when his blood pressure was checked it was 80 systolic. His catheter was removed without difficulty and he has been able to void clear urine since catheter removal. He denies any fevers, chest pain, shortness of breath. He did have some nausea and abdominal pain postoperatively but this is completely resolved. He does endorse poor oral fluid intake and decreased appetite. No lower extremity edema or pain. He has a history of hypertension, hyperlipidemia. Past Medical History:  Diagnosis Date  . Chicken pox   . Coronary artery disease    a. 1999 s/p MI with cath/PCI;  b. 05/1999 Ex Cardiolite EF 68%, no ishcemia.  . Dyslipidemia   . History of tobacco abuse   . Hypertension   . Hypokalemia   . Injury of cervical spine ([place])    a. 02/2012 C4/5  . Measles   . Mumps   . Nasal bones, closed fracture    a. 02/2012 in setting of presyncope/fall  . Near syncope   . Pneumonia   . Prostate cancer ([place])   . Whooping cough     Patient Active Problem List   Diagnosis Date Noted  . Prostate cancer ([place]) 02/14/2018  . Cervical myelopathy ([place]) 03/18/2012  . SCI (spinal cord injury) 03/18/2012  . HTN (hypertension) 03/18/2012  . Nasal bone fracture  03/11/2012  . CAD (coronary artery disease) 03/11/2012    Past Surgical History:  Procedure Laterality Date  . [place]  . ANTERIOR CERVICAL DECOMP/DISCECTOMY FUSION  03/13/2012   Procedure: ANTERIOR CERVICAL DECOMPRESSION/DISCECTOMY FUSION 1 LEVEL;  Surgeon: Erline Levine, MD;  Location: [place] [facility] ORS;  Service: Neurosurgery;  Laterality: N/A;  Anterior Cervical Decompression/Fusion. Cervical four-five.  Marland Kitchen CARDIAC CATHETERIZATION  06/28/1998   single vessel CAD involving the distal RCA/PTCA and stenting of the distal RCA//EF- 50-55%  . CLOSED REDUCTION NASAL FRACTURE  03/13/2012   Procedure: CLOSED REDUCTION NASAL FRACTURE;  Surgeon: Theodoro Kos, DO;  Location: [place] NEUR[facility] ORS;  Service: Plastics;  Laterality: N/A;  Internal and external splinting of nasal fracture  . LYMPHADENECTOMY Bilateral 02/14/2018   Procedure: [facility];  Surgeon: Raynelle Bring, MD;  Location: [facility];  Service: Urology;  Laterality: Bilateral;  . NM MYOVIEW LTD  05/17/2009   normal stress nuclear study/no evidence of ischemia/EF- 68%  . petalla tendon surgery  1989  . ROBOT ASSISTED LAPAROSCOPIC RADICAL PROSTATECTOMY N/A 02/14/2018   Procedure: XI ROBOTIC ASSISTED LAPAROSCOPIC RADICAL PROSTATECTOMY;  Surgeon: Raynelle Bring, MD;  Location: [facility];  Service: Urology;  Laterality: N/A;        Home Medications    Prior to Admission medications   Medication Sig Start Date End Date Taking? Authorizing Provider  amLODipine (NORVASC) 10 MG tablet Take 10  mg by mouth daily.    Yes [provider]  atorvastatin (LIPITOR) 40 MG tablet Take 1 tablet (40 mg total) by mouth daily. 09/27/17 02/22/18 Yes Nahser, Wonda Cheng, MD  docusate sodium (COLACE) 100 MG capsule Take 100 mg by mouth daily as needed for mild constipation.   Yes [provider]  lisinopril (PRINIVIL,ZESTRIL) 20 MG tablet Take 1 tablet (20 mg total) by mouth daily. 03/10/14 02/22/18 Yes Nahser, Wonda Cheng, MD    metoprolol succinate (TOPROL-XL) 50 MG 24 hr tablet Take 50 mg by mouth daily. Take with or immediately following a meal.   Yes [provider]  nitroGLYCERIN (NITROSTAT) 0.4 MG SL tablet place 1 tablet under the tongue if needed for chest pain Patient taking differently: Place 0.4 mg under the tongue every 5 (five) minutes as needed for chest pain.  07/05/16  Yes Nahser, Wonda Cheng, MD  sulfamethoxazole-trimethoprim (BACTRIM DS,SEPTRA DS) 800-160 MG tablet Take 1 tablet by mouth 2 (two) times daily. Start the day prior to foley removal appointment 02/14/18  Yes Dancy, Estill Bamberg, PA-C  tiZANidine (ZANAFLEX) 2 MG tablet Take 2 mg by mouth every 8 (eight) hours as needed for muscle spasms.   Yes [provider]  traMADol (ULTRAM) 50 MG tablet Take 1-2 tablets (50-100 mg total) by mouth every 6 (six) hours as needed for moderate pain or severe pain. 02/14/18  Yes Debbrah Alar, PA-C    Family History Family History  Problem Relation Age of Onset  . Heart attack Father        41  . Cancer Mother        39    Social History Social History   Tobacco Use  . Smoking status: Former Smoker    Packs/day: 1.00    Years: 20.00    Pack years: 20.00    Types: Cigarettes    Last attempt to quit: 11/01/1988    Years since quitting: 29.3  . Smokeless tobacco: Never Used  Substance Use Topics  . Alcohol use: Yes    Comment: occasional alcoholic beverage.  . Drug use: No     Allergies   Patient has no known allergies.   Review of Systems Review of Systems  All other systems reviewed and are negative.    Physical Exam Updated Vital Signs BP 130/83   Pulse (!) 58   Temp 98.1 F (36.7 C) (Oral)   Resp 12   Ht 6' (1.829 m)   Wt 86.2 kg (190 lb)   SpO2 100%   BMI 25.77 kg/m   Physical Exam  Constitutional: He is oriented to person, place, and time. He appears well-developed and well-nourished.  HENT:  Head: Normocephalic and atraumatic.  Cardiovascular: Normal rate and  regular rhythm.  No murmur heard. Pulmonary/Chest: Effort normal and breath sounds normal. No respiratory distress.  Abdominal: Soft. There is no tenderness. There is no rebound and no guarding.  Abdominal surgical incision sites clean, dry, intact.    Musculoskeletal: He exhibits no edema or tenderness.  Neurological: He is alert and oriented to person, place, and time.  Skin: Skin is warm and dry.  Psychiatric: He has a normal mood and affect. His behavior is normal.  Nursing note and vitals reviewed.    ED Treatments / Results  Labs (all labs ordered are listed, but only abnormal results are displayed) Labs Reviewed  COMPREHENSIVE METABOLIC PANEL - Abnormal; Notable for the following components:      Result Value   Calcium 8.3 (*)  Total Protein 5.6 (*)    Albumin 3.0 (*)    All other components within normal limits  CBC WITH DIFFERENTIAL/PLATELET - Abnormal; Notable for the following components:   RBC 3.95 (*)    Hemoglobin 12.7 (*)    HCT 36.4 (*)    All other components within normal limits  I-STAT TROPONIN, ED    EKG EKG Interpretation  Date/Time:  Friday February 22 2018 10:23:38 EDT Ventricular Rate:  55 PR Interval:    QRS Duration: 95 QT Interval:  444 QTC Calculation: 425 R Axis:   -2 Text Interpretation:  Sinus rhythm No significant change since last tracing Confirmed by Quintella Reichert [phone]) on 02/22/2018 10:34:29 AM   Radiology No results found.  Procedures Procedures (including critical care time)  Medications Ordered in ED Medications  sodium chloride 0.9 % bolus 1,000 mL (0 mLs Intravenous Stopped 02/22/18 1119)     Initial Impression / Assessment and Plan / ED Course  I have reviewed the triage vital signs and the nursing notes.  Pertinent labs & imaging results that were available during my care of the patient were reviewed by me and considered in my medical decision making (see chart for details).    Pt presents to the emergency  department upon referral from urology office for ortho stasis and hypotension. After IV fluid administration he is feeling improved. He had not eaten this morning and had taken two tramadol. Current presentation is not consistent with ACS, sepsis, PE. Discussed with patient home care for dehydration and ortho stasis. Discussed outpatient follow-up and return precautions.  Final Clinical Impressions(s) / ED Diagnoses   Final diagnoses:  Orthostasis  Dehydration    ED Discharge Orders    None       Quintella Reichert, MD 02/22/18 1735

## 2018-02-22 NOTE — ED Triage Notes (Signed)
Patient seen at urology for post surgery for prostate removal. Staff noticed low blood BP 80/60. EMS gave 186ml NS. Last BP 112/70.   BP: 112/70 HR: 68 O2: 99% 20G IV in right hand

## 2018-02-22 NOTE — ED Notes (Signed)
Patient verbalized understanding of discharge instructions, no questions. Patient out of ED via wheelchair in no distress.  

## 2018-02-22 NOTE — ED Notes (Signed)
Bed: DX41 Expected date:  Expected time:  Means of arrival:  Comments: EMS-low BP

## 2018-03-04 DIAGNOSIS — I10 Essential (primary) hypertension: Secondary | ICD-10-CM | POA: Diagnosis not present

## 2018-03-13 DIAGNOSIS — M62838 Other muscle spasm: Secondary | ICD-10-CM | POA: Diagnosis not present

## 2018-03-13 DIAGNOSIS — N393 Stress incontinence (female) (male): Secondary | ICD-10-CM | POA: Diagnosis not present

## 2018-03-13 DIAGNOSIS — M6281 Muscle weakness (generalized): Secondary | ICD-10-CM | POA: Diagnosis not present

## 2018-03-18 DIAGNOSIS — I2510 Atherosclerotic heart disease of native coronary artery without angina pectoris: Secondary | ICD-10-CM | POA: Diagnosis not present

## 2018-03-18 DIAGNOSIS — I10 Essential (primary) hypertension: Secondary | ICD-10-CM | POA: Diagnosis not present

## 2018-03-29 ENCOUNTER — Other Ambulatory Visit: Payer: Federal, State, Local not specified - PPO | Admitting: *Deleted

## 2018-03-29 DIAGNOSIS — I2510 Atherosclerotic heart disease of native coronary artery without angina pectoris: Secondary | ICD-10-CM

## 2018-03-29 DIAGNOSIS — E782 Mixed hyperlipidemia: Secondary | ICD-10-CM | POA: Diagnosis not present

## 2018-03-29 LAB — LIPID PANEL
Chol/HDL Ratio: 3.6 ratio (ref 0.0–5.0)
Cholesterol, Total: 158 mg/dL (ref 100–199)
HDL: 44 mg/dL (ref >39)
LDL Calculated: 77 mg/dL (ref 0–99)
Triglycerides: 185 mg/dL — ABNORMAL HIGH (ref 0–149)
VLDL Cholesterol Cal: 37 mg/dL (ref 5–40)

## 2018-03-29 LAB — BASIC METABOLIC PANEL
BUN/Creatinine Ratio: 10 (ref 10–24)
BUN: 10 mg/dL (ref 8–27)
CO2: 22 mmol/L (ref 20–29)
Calcium: 9.4 mg/dL (ref 8.6–10.2)
Chloride: 109 mmol/L — ABNORMAL HIGH (ref 96–106)
Creatinine, Ser: 0.99 mg/dL (ref 0.76–1.27)
GFR calc Af Amer: 90 mL/min/{1.73_m2} (ref >59)
GFR calc non Af Amer: 78 mL/min/{1.73_m2} (ref >59)
Glucose: 97 mg/dL (ref 65–99)
Potassium: 4 mmol/L (ref 3.5–5.2)
Sodium: 145 mmol/L — ABNORMAL HIGH (ref 134–144)

## 2018-03-29 LAB — HEPATIC FUNCTION PANEL
ALT: 22 IU/L (ref 0–44)
AST: 23 IU/L (ref 0–40)
Albumin: 4.4 g/dL (ref 3.6–4.8)
Alkaline Phosphatase: 65 IU/L (ref 39–117)
Bilirubin Total: 0.4 mg/dL (ref 0.0–1.2)
Bilirubin, Direct: 0.12 mg/dL (ref 0.00–0.40)
Total Protein: 6.6 g/dL (ref 6.0–8.5)

## 2018-04-02 DIAGNOSIS — C61 Malignant neoplasm of prostate: Secondary | ICD-10-CM | POA: Diagnosis not present

## 2018-04-03 ENCOUNTER — Encounter (INDEPENDENT_AMBULATORY_CARE_PROVIDER_SITE_OTHER): Payer: Self-pay

## 2018-04-03 ENCOUNTER — Encounter: Payer: Self-pay | Admitting: Physician Assistant

## 2018-04-03 ENCOUNTER — Ambulatory Visit: Payer: Federal, State, Local not specified - PPO | Admitting: Physician Assistant

## 2018-04-03 VITALS — BP 138/86 | HR 60 | Ht 72.0 in | Wt 201.0 lb

## 2018-04-03 DIAGNOSIS — I10 Essential (primary) hypertension: Secondary | ICD-10-CM

## 2018-04-03 DIAGNOSIS — I959 Hypotension, unspecified: Secondary | ICD-10-CM

## 2018-04-03 DIAGNOSIS — E782 Mixed hyperlipidemia: Secondary | ICD-10-CM | POA: Diagnosis not present

## 2018-04-03 DIAGNOSIS — I2510 Atherosclerotic heart disease of native coronary artery without angina pectoris: Secondary | ICD-10-CM | POA: Diagnosis not present

## 2018-04-03 DIAGNOSIS — R42 Dizziness and giddiness: Secondary | ICD-10-CM

## 2018-04-03 NOTE — Progress Notes (Signed)
Cardiology Office Note    Date:  04/03/2018   ID:  Edward [name], DOB 1950/09/30, MRN [id number]  PCP:  Darcus Austin, MD  Cardiologist: Dr. Acie Fredrickson  Chief Complaint: ER follow up for hypotension   History of Present Illness:   Edward [name] is a 67 y.o. male with hx of inferior MI in 1999 s/p PTCA with stenting to RCA, hypertension, hyperlipidemia and prior tobacco abuse presents for follow up.   Had normal nuclear study in 2010. He was doing well on cardiac stand point when last seen by Dr. Acie Fredrickson 08/2017.   Seen in ER 02/22/18 for hypotension and dizziness. Improved after hydration and discharged.  He follow-up with PCP afterwards >> reduce metoprolol and discontinue lisinopril.  He continues to have intermittent episode of hypotension and bradycardia in low 50s.  No longer on Toprol or lisinopril with improved symptoms and vital signs.  Currently takes amlodipine 10 mg daily.  He denies chest pain, shortness of breath, palpitation, dizziness, headache, orthopnea, PND, syncope or melena.  He denies stationary bike daily.  Past Medical History:  Diagnosis Date  . Chicken pox   . Coronary artery disease    a. 1999 s/p MI with cath/PCI;  b. 05/1999 Ex Cardiolite EF 68%, no ishcemia.  . Dyslipidemia   . History of tobacco abuse   . Hypertension   . Hypokalemia   . Injury of cervical spine ([place])    a. 02/2012 C4/5  . Measles   . Mumps   . Nasal bones, closed fracture    a. 02/2012 in setting of presyncope/fall  . Near syncope   . Pneumonia   . Prostate cancer ([place])   . Whooping cough     Past Surgical History:  Procedure Laterality Date  . [place]  . ANTERIOR CERVICAL DECOMP/DISCECTOMY FUSION  03/13/2012   Procedure: ANTERIOR CERVICAL DECOMPRESSION/DISCECTOMY FUSION 1 LEVEL;  Surgeon: Erline Levine, MD;  Location: [place] NEURO ORS;  Service: Neurosurgery;  Laterality: N/A;  Anterior Cervical Decompression/Fusion. Cervical four-five.  Marland Kitchen CARDIAC  CATHETERIZATION  06/28/1998   single vessel CAD involving the distal RCA/PTCA and stenting of the distal RCA//EF- 50-55%  . CLOSED REDUCTION NASAL FRACTURE  03/13/2012   Procedure: CLOSED REDUCTION NASAL FRACTURE;  Surgeon: Theodoro Kos, DO;  Location: [place] NEURO ORS;  Service: Plastics;  Laterality: N/A;  Internal and external splinting of nasal fracture  . LYMPHADENECTOMY Bilateral 02/14/2018   Procedure: [facility];  Surgeon: Raynelle Bring, MD;  [facility];  Service: Urology;  Laterality: Bilateral;  . NM MYOVIEW LTD  05/17/2009   normal stress nuclear study/no evidence of ischemia/EF- 68%  . petalla tendon surgery  1989  . ROBOT ASSISTED LAPAROSCOPIC RADICAL PROSTATECTOMY N/A 02/14/2018   Procedure: XI ROBOTIC ASSISTED LAPAROSCOPIC RADICAL PROSTATECTOMY;  Surgeon: Raynelle Bring, MD;  [facility];  Service: Urology;  Laterality: N/A;    Current Medications: Prior to Admission medications   Medication Sig Start Date End Date Taking? Authorizing Provider  amLODipine (NORVASC) 10 MG tablet Take 10 mg by mouth daily.     [provider]  atorvastatin (LIPITOR) 40 MG tablet Take 1 tablet (40 mg total) by mouth daily. 09/27/17 02/22/18  Nahser, Wonda Cheng, MD  docusate sodium (COLACE) 100 MG capsule Take 100 mg by mouth daily as needed for mild constipation.    [provider]  lisinopril (PRINIVIL,ZESTRIL) 20 MG tablet Take 1 tablet (20 mg total) by mouth daily. 03/10/14 02/22/18  Nahser, Wonda Cheng, MD  metoprolol succinate (TOPROL-XL) 50 MG 24 hr tablet Take 50 mg by mouth daily. Take with or immediately following a meal.    [provider]  nitroGLYCERIN (NITROSTAT) 0.4 MG SL tablet place 1 tablet under the tongue if needed for chest pain Patient taking differently: Place 0.4 mg under the tongue every 5 (five) minutes as needed for chest pain.  07/05/16   Nahser, Wonda Cheng, MD  sulfamethoxazole-trimethoprim (BACTRIM DS,SEPTRA DS) 800-160 MG tablet Take 1  tablet by mouth 2 (two) times daily. Start the day prior to foley removal appointment 02/14/18   Debbrah Alar, PA-C  tiZANidine (ZANAFLEX) 2 MG tablet Take 2 mg by mouth every 8 (eight) hours as needed for muscle spasms.    [provider]  traMADol (ULTRAM) 50 MG tablet Take 1-2 tablets (50-100 mg total) by mouth every 6 (six) hours as needed for moderate pain or severe pain. 02/14/18   Debbrah Alar, PA-C    Allergies:   Patient has no known allergies.   Social History   Socioeconomic History  . Marital status: Married    Spouse name: Not on file  . Number of children: Not on file  . Years of education: Not on file  . Highest education level: Not on file  Occupational History  . 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 Financial resource strain: Not on file  . Food insecurity:    Worry: Not on file    Inability: Not on file  . Transportation needs:    Medical: Not on file    Non-medical: Not on file  Tobacco Use  . Smoking status: Former Smoker    Packs/day: 1.00    Years: 20.00    Pack years: 20.00    Types: Cigarettes    Last attempt to quit: 11/01/1988    Years since quitting: 29.4  . Smokeless tobacco: Never Used  Substance and Sexual Activity  . Alcohol use: Yes    Comment: occasional alcoholic beverage.  . Drug use: No  . Sexual activity: Yes  Lifestyle  . Physical activity:    Days per week: Not on file    Minutes per session: Not on file  . Stress: Not on file  Relationships  . Social connections:    Talks on phone: Not on file    Gets together: Not on file    Attends religious service: Not on file    Active member of club or organization: Not on file    Attends meetings of clubs or organizations: Not on file    Relationship status: Not on file  Other Topics Concern  . Not on file  Social History Narrative   Married and lives with wife in [place].  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  Exercises 2-3 days/wk without  limitations.     Family History:  The patient's family history includes Cancer in his mother; Heart attack in his father.   ROS:   Please see the history of present illness.    ROS All other systems reviewed and are negative.   PHYSICAL EXAM:   VS:  BP 138/86   Pulse 60   Ht 6' (1.829 m)   Wt 201 lb (91.2 kg)   SpO2 99%   BMI 27.26 kg/m    GEN: Well nourished, well developed, in no acute distress  HEENT: normal  Neck: no JVD, carotid bruits, or masses Cardiac:RRR; no murmurs, rubs, or gallops,no edema  Respiratory:  clear to auscultation bilaterally, normal work of breathing GI: soft, nontender, nondistended, + BS MS: no deformity or atrophy  Skin: warm and dry, no rash Neuro:  Alert and Oriented x 3, Strength and sensation are intact Psych: euthymic mood, full affect  Wt Readings from Last 3 Encounters:  04/03/18 201 lb (91.2 kg)  02/22/18 190 lb (86.2 kg)  02/14/18 201 lb (91.2 kg)      Studies/Labs Reviewed:   EKG:  EKG is not ordered today.    Recent Labs: 02/22/2018: Hemoglobin 12.7; Platelets 241 03/29/2018: ALT 22; BUN 10; Creatinine, Ser 0.99; Potassium 4.0; Sodium 145   Lipid Panel    Component Value Date/Time   CHOL 158 03/29/2018 0950   TRIG 185 (H) 03/29/2018 0950   HDL 44 03/29/2018 0950   CHOLHDL 3.6 03/29/2018 0950   CHOLHDL 6 07/01/2014 0918   VLDL 48.4 (H) 07/01/2014 0918   LDLCALC 77 03/29/2018 0950   LDLDIRECT 81.6 07/01/2014 0918    Additional studies/ records that were reviewed today include:   As above  ASSESSMENT & PLAN:    1. CAD No angina.  Continue aspirin and statin.  Toprol-XL discontinued due to bradycardia and hypotension.  We will continue to hold.  2.  Recent hypotension and dizziness He was noted bradycardic and hypotension in the ER as well as PCP office.  Now resolved with discontinuation of metoprolol and lisinopril.  Heart rate in 60.   3.  Hyperlipidemia - 03/29/2018: Cholesterol, Total 158; HDL 44; LDL  Calculated 77; Triglycerides 185  -Continue statin.   Medication Adjustments/Labs and Tests Ordered: Current medicines are reviewed at length with the patient today.  Concerns regarding medicines are outlined above.  Medication changes, Labs and Tests ordered today are listed in the Patient Instructions below. Patient Instructions  Medication Instructions:  Your physician recommends that you continue on your current medications as directed. Please refer to the Current Medication list given to you today.   Labwork: None ordered  Testing/Procedures: None ordered  Follow-Up: Your physician recommends that you schedule a follow-up appointment in: WITH DR. Acie Fredrickson AS PLANNED   Any Other Special Instructions Will Be Listed Below (If Applicable).     If you need a refill on your cardiac medications before your next appointment, please call your pharmacy.      Jarrett Soho, [place]  04/03/2018 8:29 AM    [address], [place], [place]  [address] Phone: [phone]; Fax: [phone]

## 2018-04-03 NOTE — Patient Instructions (Addendum)
Medication Instructions:  Your physician recommends that you continue on your current medications as directed. Please refer to the Current Medication list given to you today.   Labwork: None ordered  Testing/Procedures: None ordered  Follow-Up: Your physician recommends that you schedule a follow-up appointment in: WITH DR. Acie Fredrickson AS PLANNED   Any Other Special Instructions Will Be Listed Below (If Applicable).     If you need a refill on your cardiac medications before your next appointment, please call your pharmacy.

## 2018-04-09 DIAGNOSIS — C61 Malignant neoplasm of prostate: Secondary | ICD-10-CM | POA: Diagnosis not present

## 2018-04-09 DIAGNOSIS — N393 Stress incontinence (female) (male): Secondary | ICD-10-CM | POA: Diagnosis not present

## 2018-04-10 ENCOUNTER — Encounter: Payer: Self-pay | Admitting: Radiation Oncology

## 2018-04-23 DIAGNOSIS — Z23 Encounter for immunization: Secondary | ICD-10-CM | POA: Diagnosis not present

## 2018-05-03 ENCOUNTER — Encounter: Payer: Self-pay | Admitting: Radiation Oncology

## 2018-05-03 NOTE — Progress Notes (Signed)
GU Location of Tumor / Histology: Lymph node positive prostatic adenocarcinoma  If Prostate Cancer, Gleason Score is (4 + 5) and PSA is (4.55) pretreatment.   Edward [name] presented in January 2019 for initial prostate biopsy. Biopsy had to be delayed because patient didn't stop aspirin as directed. Prostate biopsy ultimately done in April 2019. Patient is s/p a UNS RAL radical prostatectomy and BPLND on 02/14/18. Unfortunately, positive surgical margins and 2/13 positive lymph nodes were revealed.    Past/Anticipated interventions by urology, if any: prostate biopsy, prostatectomy, referral to radiation oncology for adjuvant therapy  Past/Anticipated interventions by medical oncology, if any: no  Weight changes, if any: no  Bowel/Bladder complaints, if any: Regained continence s/p prostatectomy. Denies dysuria or hematuria. IPSS 12. SHIM 5.    Nausea/Vomiting, if any: no  Pain issues, if any:  Chronic back pain  SAFETY ISSUES:  Prior radiation? no  Pacemaker/ICD? no  Possible current pregnancy? no  Is the patient on methotrexate? 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

## 2018-05-06 ENCOUNTER — Encounter: Payer: Self-pay | Admitting: Medical Oncology

## 2018-05-06 ENCOUNTER — Other Ambulatory Visit: Payer: Self-pay

## 2018-05-06 ENCOUNTER — Ambulatory Visit
Admission: RE | Admit: 2018-05-06 | Discharge: 2018-05-06 | Disposition: A | Discharge status: Home / Self Care | Payer: Federal, State, Local not specified - PPO | Source: Ambulatory Visit | Attending: Radiation Oncology | Admitting: Radiation Oncology

## 2018-05-06 ENCOUNTER — Encounter: Payer: Self-pay | Admitting: Radiation Oncology

## 2018-05-06 VITALS — BP 145/93 | HR 55 | Temp 97.6°F | Resp 20 | Ht 72.0 in | Wt 196.4 lb

## 2018-05-06 DIAGNOSIS — Z809 Family history of malignant neoplasm, unspecified: Secondary | ICD-10-CM

## 2018-05-06 DIAGNOSIS — C61 Malignant neoplasm of prostate: Secondary | ICD-10-CM

## 2018-05-06 DIAGNOSIS — Z79899 Other long term (current) drug therapy: Secondary | ICD-10-CM | POA: Diagnosis not present

## 2018-05-06 DIAGNOSIS — I2510 Atherosclerotic heart disease of native coronary artery without angina pectoris: Secondary | ICD-10-CM | POA: Insufficient documentation

## 2018-05-06 DIAGNOSIS — I10 Essential (primary) hypertension: Secondary | ICD-10-CM | POA: Diagnosis not present

## 2018-05-06 DIAGNOSIS — Z9079 Acquired absence of other genital organ(s): Secondary | ICD-10-CM | POA: Diagnosis not present

## 2018-05-06 DIAGNOSIS — I252 Old myocardial infarction: Secondary | ICD-10-CM | POA: Insufficient documentation

## 2018-05-06 DIAGNOSIS — Z808 Family history of malignant neoplasm of other organs or systems: Secondary | ICD-10-CM | POA: Diagnosis not present

## 2018-05-06 DIAGNOSIS — C775 Secondary and unspecified malignant neoplasm of intrapelvic lymph nodes: Secondary | ICD-10-CM | POA: Diagnosis not present

## 2018-05-06 NOTE — Progress Notes (Signed)
Introduced myself to Edward [name] and his daughter as the prostate nurse navigator and my role. He states he had prostatectomy but has positive lymph nodes and margins. He will begin adjuvant radiation in the near future. I asked him to call me with questions or concerns. He voiced understanding.

## 2018-05-06 NOTE — Progress Notes (Signed)
See progress note under physician encounter. 

## 2018-05-06 NOTE — Progress Notes (Signed)
2 Radiation Oncology         (336) [phone] ________________________________  Initial Outpatient Consultation  Name: Edward [name] MRN: [id number]  Date: 05/06/2018  DOB: Jul 04, 1951  RJ:JOACZ, Butch Penny, MD  Raynelle Bring, MD   REFERRING PHYSICIAN: Raynelle Bring, MD  DIAGNOSIS: 67 y.o. gentleman with high risk, Stage T3a N1 adenocarcinoma of the prostate with positive nodes following prostatectomy and Gleason Score of 4+5 with undetectable PSA post-prostatectomy.    ICD-10-CM   1. Malignant neoplasm of prostate [facility]) [place] [facility] referral to Social Work    HISTORY OF PRESENT ILLNESS: Edward [name] is a 67 y.o. male with a history of prostate cancer. He was diagnosed this summer after PSA was 4.55, and biopsy revealed a 5+4 Gleason score. He underwent prostatectomy on 02/14/18. Final pathology revealed prostatic adenocarcinoma, Gleason score 4+5, with extraprostatic extension at right anterolateral and posterolateral from the apex to the mid, margin of resection is positive at the apex (left: 22mm right 2 and 77mm, Gleason score 5+4=9), 2/12 positive lymph nodes. His PSA has remained undetectable post-prostatectomy. He notes continuing exercises with physical therapy. The patient reviewed the surgical results with his urologist and he has kindly been referred today for discussion of potential radiation treatment options for adjuvant therapy.   PREVIOUS RADIATION THERAPY: No  PAST MEDICAL HISTORY:  Past Medical History:  Diagnosis Date  . Chicken pox   . Coronary artery disease    a. 1999 s/p MI with cath/PCI;  b. 05/1999 Ex Cardiolite EF 68%, no ishcemia.  . Dyslipidemia   . History of tobacco abuse   . Hypertension   . Hypokalemia   . Injury of cervical spine ([place])    a. 02/2012 C4/5  . Measles   . Mumps   . Nasal bones, closed fracture    a. 02/2012 in setting of presyncope/fall  . Near syncope   . Pneumonia   . Prostate cancer ([employer])   . Whooping cough       PAST SURGICAL  HISTORY: Past Surgical History:  Procedure Laterality Date  . [place]  . ANTERIOR CERVICAL DECOMP/DISCECTOMY FUSION  03/13/2012   Procedure: ANTERIOR CERVICAL DECOMPRESSION/DISCECTOMY FUSION 1 LEVEL;  Surgeon: Erline Levine, MD;  Location: [place] NEURO ORS;  Service: Neurosurgery;  Laterality: N/A;  Anterior Cervical Decompression/Fusion. Cervical four-five.  Marland Kitchen CARDIAC CATHETERIZATION  06/28/1998   single vessel CAD involving the distal RCA/PTCA and stenting of the distal RCA//EF- 50-55%  . CLOSED REDUCTION NASAL FRACTURE  03/13/2012   Procedure: CLOSED REDUCTION NASAL FRACTURE;  Surgeon: Theodoro Kos, DO;  Location: [place] NEURO ORS;  Service: Plastics;  Laterality: N/A;  Internal and external splinting of nasal fracture  . LYMPHADENECTOMY Bilateral 02/14/2018   Procedure: [facility];  Surgeon: Raynelle Bring, MD;  Location: [facility] ORS;  Service: Urology;  Laterality: Bilateral;  . NM MYOVIEW LTD  05/17/2009   normal stress nuclear study/no evidence of ischemia/EF- 68%  . petalla tendon surgery  1989  . ROBOT ASSISTED LAPAROSCOPIC RADICAL PROSTATECTOMY N/A 02/14/2018   Procedure: XI ROBOTIC ASSISTED LAPAROSCOPIC RADICAL PROSTATECTOMY;  Surgeon: Raynelle Bring, MD;  Location: [facility] ORS;  Service: Urology;  Laterality: N/A;    FAMILY HISTORY:  Family History  Problem Relation Age of Onset  . Heart attack Father        22  . Cancer Mother        32    SOCIAL HISTORY:  Social History   Socioeconomic History  . Marital status: Married  Spouse name: Not on file  . Number of children: Not on file  . Years of education: Not on file  . Highest education level: Not on file  Occupational History  . 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 Financial resource strain: Not on file  . Food insecurity:    Worry: Not on file    Inability: Not on file  . Transportation needs:    Medical: Not on file    Non-medical: Not on file  Tobacco Use  .  Smoking status: Former Smoker    Packs/day: 1.00    Years: 20.00    Pack years: 20.00    Types: Cigarettes    Last attempt to quit: 11/01/1988    Years since quitting: 29.5  . Smokeless tobacco: Never Used  Substance and Sexual Activity  . Alcohol use: Yes    Comment: occasional alcoholic beverage.  . Drug use: No  . Sexual activity: Yes  Lifestyle  . Physical activity:    Days per week: Not on file    Minutes per session: Not on file  . Stress: Not on file  Relationships  . Social connections:    Talks on phone: Not on file    Gets together: Not on file    Attends religious service: Not on file    Active member of club or organization: Not on file    Attends meetings of clubs or organizations: Not on file    Relationship status: Not on file  . Intimate partner violence:    Fear of current or ex partner: Not on file    Emotionally abused: Not on file    Physically abused: Not on file    Forced sexual activity: Not on file  Other Topics Concern  . Not on file  Social History Narrative   Married and lives with wife in [place].  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  Exercises 2-3 days/wk without limitations.  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 He's accompanied by his daughter.  ALLERGIES: Patient has no known allergies.  MEDICATIONS:  Current Outpatient Medications  Medication Sig Dispense Refill  . amLODipine (NORVASC) 10 MG tablet Take 10 mg by mouth daily.     Marland Kitchen aspirin EC 81 MG tablet Take 81 mg by mouth daily.    . cholecalciferol (VITAMIN D) 1000 units tablet Take 1,000 Units by mouth daily.    Marland Kitchen docusate sodium (COLACE) 100 MG capsule Take 100 mg by mouth daily as needed for mild constipation.    . ferrous sulfate 325 (65 FE) MG tablet Take 325 mg by mouth daily with breakfast.    . Multiple Vitamin (MULTIVITAMIN) tablet Take 1 tablet by mouth daily.    . nitroGLYCERIN (NITROSTAT) 0.4 MG SL tablet Place 0.4 mg under the  tongue every 5 (five) minutes as needed for chest pain.    . Omega-3 Fatty Acids (FISH OIL) 1000 MG CAPS Take by mouth.    Marland Kitchen tiZANidine (ZANAFLEX) 2 MG tablet Take 2 mg by mouth every 8 (eight) hours as needed for muscle spasms.    . traMADol (ULTRAM) 50 MG tablet Take 1-2 tablets (50-100 mg total) by mouth every 6 (six) hours as needed for moderate pain or severe pain. 30 tablet 0  . atorvastatin (LIPITOR) 40 MG tablet Take 1 tablet (40 mg total) by mouth daily. 90 tablet 3   No current facility-administered medications  for this encounter.     REVIEW OF SYSTEMS:  On review of systems, the patient reports that he is doing well overall. He denies any chest pain, shortness of breath, cough, fevers, chills, night sweats, unintended weight changes. He denies any bowel disturbances, and denies abdominal pain, nausea or vomiting. He has regained continence since surgery. He notes a sharp pain to his navel if he doesn't use the restroom. He denies any new musculoskeletal or joint aches or pains. His IPSS was 12, indicating moderate urinary symptoms. He is unable to complete sexual activity with most attempts. A complete review of systems is obtained and is otherwise negative.     PHYSICAL EXAM:  Wt Readings from Last 3 Encounters:  05/06/18 196 lb 6.4 oz (89.1 kg)  04/03/18 201 lb (91.2 kg)  02/22/18 190 lb (86.2 kg)   Temp Readings from Last 3 Encounters:  05/06/18 97.6 F (36.4 C) (Oral)  02/22/18 98.1 F (36.7 C) (Oral)  02/15/18 98 F (36.7 C) (Oral)   BP Readings from Last 3 Encounters:  05/06/18 (!) 145/93  04/03/18 138/86  02/22/18 130/83   Pulse Readings from Last 3 Encounters:  05/06/18 (!) 55  04/03/18 60  02/22/18 (!) 58   Pain Assessment Pain Score: 0-No pain/10  In general this is a well appearing African American gentleman in no acute distress. He is alert and oriented x4 and appropriate throughout the examination.  Cardiopulmonary assessment is negative for acute  distress and he exhibits normal effort.     KPS = 100  100 - Normal; no complaints; no evidence of disease. 90   - Able to carry on normal activity; minor signs or symptoms of disease. 80   - Normal activity with effort; some signs or symptoms of disease. 35   - Cares for self; unable to carry on normal activity or to do active work. 60   - Requires occasional assistance, but is able to care for most of his personal needs. 50   - Requires considerable assistance and frequent medical care. 62   - Disabled; requires special care and assistance. 78   - Severely disabled; hospital admission is indicated although death not imminent. 64   - Very sick; hospital admission necessary; active supportive treatment necessary. 10   - Moribund; fatal processes progressing rapidly. 0     - Dead  Karnofsky DA, Abelmann [place], Craver LS and Burchenal [facility] [phone]) The use of the nitrogen mustards in the palliative treatment of carcinoma: with particular reference to bronchogenic carcinoma Cancer 1 634-56  LABORATORY DATA:  Lab Results  Component Value Date   WBC 4.7 02/22/2018   HGB 12.7 (L) 02/22/2018   HCT 36.4 (L) 02/22/2018   MCV 92.2 02/22/2018   PLT 241 02/22/2018   Lab Results  Component Value Date   NA 145 (H) 03/29/2018   K 4.0 03/29/2018   CL 109 (H) 03/29/2018   CO2 22 03/29/2018   Lab Results  Component Value Date   ALT 22 03/29/2018   AST 23 03/29/2018   ALKPHOS 65 03/29/2018   BILITOT 0.4 03/29/2018     RADIOGRAPHY: No results found.    IMPRESSION/PLAN: 1. 67 y.o. gentleman with high risk, Stage T3a N1 adenocarcinoma of the prostate with positive nodes following prostatectomy and Gleason Score of 4+5, and presurgical PSA of 4.55. Today we discussed adjuvant radiation therapy to the prostate bed and regional nodes. We anticipate 38 daily treatments over the course of 7.5 weeks. We also discussed  the possible involvement of ADT in his treatment plan. This will be discussed with Dr.  Alinda Money. At the end of the discussion, the patient will be set up for simulation in the next week. He is in agreement with this plan. The risks, benefits, short, and long term effects of therapy were discussed. 2. Possible genetic predisposition to malignancy. The patient is a candidate for genetic testing given his personal and family history. He was offered referral and will be set up for evaluation in the near future.  In a visit lasting 45 minutes, greater than 50% of the time was spent face to face discussing his case, and coordinating the patient's care.    Carola Rhine, PAC    Tyler Pita, MD  [facility] Direct Dial: [phone]  Fax: [phone] [email or web address]  Skype  [email or web address]    Page Me      This document serves as a record of services personally performed by Shona Simpson, PA-C and Dr. Tammi Klippel. It was created on their behalf by Wilburn Mylar, a trained medical scribe. The creation of this record is based on the scribe's personal observations and the provider's statements to them. This document has been checked and approved by the attending provider.

## 2018-05-07 ENCOUNTER — Encounter: Payer: Self-pay | Admitting: Licensed Clinical Social Worker

## 2018-05-07 ENCOUNTER — Telehealth: Payer: Self-pay | Admitting: Licensed Clinical Social Worker

## 2018-05-07 NOTE — Telephone Encounter (Signed)
New genetic counseling referral received from Dr. Tammi Klippel for prostate cancer and fhx of cancer. Pt has been cld and scheduled to see Faith Rogue on 10/28 at 9am. Letter mailed.

## 2018-05-13 ENCOUNTER — Ambulatory Visit: Payer: Federal, State, Local not specified - PPO | Admitting: Radiation Oncology

## 2018-05-13 DIAGNOSIS — C61 Malignant neoplasm of prostate: Secondary | ICD-10-CM | POA: Diagnosis not present

## 2018-05-13 DIAGNOSIS — Z5111 Encounter for antineoplastic chemotherapy: Secondary | ICD-10-CM | POA: Diagnosis not present

## 2018-05-15 ENCOUNTER — Ambulatory Visit
Admission: RE | Admit: 2018-05-15 | Discharge: 2018-05-15 | Disposition: A | Discharge status: Home / Self Care | Payer: Federal, State, Local not specified - PPO | Source: Ambulatory Visit | Attending: Radiation Oncology | Admitting: Radiation Oncology

## 2018-05-15 DIAGNOSIS — C61 Malignant neoplasm of prostate: Secondary | ICD-10-CM | POA: Diagnosis not present

## 2018-05-15 DIAGNOSIS — Z510 Encounter for antineoplastic radiation therapy: Secondary | ICD-10-CM | POA: Diagnosis not present

## 2018-05-15 NOTE — Progress Notes (Signed)
  Radiation Oncology         (336) [phone] ________________________________  Name: Edward [name] MRN: [id number]  Date: 05/15/2018  DOB: Jul 28, 1951  SIMULATION AND TREATMENT PLANNING NOTE    ICD-10-CM   1. Prostate cancer ([place]) C61     DIAGNOSIS:  67 y.o. gentleman with high risk, Stage T3a N1 adenocarcinoma of the prostate with positive nodes following prostatectomy and Gleason Score of 4+5 with undetectable PSA post-prostatectomy.  NARRATIVE:  The patient was brought to the [facility].  Identity was confirmed.  All relevant records and images related to the planned course of therapy were reviewed.  The patient freely provided informed written consent to proceed with treatment after reviewing the details related to the planned course of therapy. The consent form was witnessed and verified by the simulation staff.  Then, the patient was set-up in a stable reproducible supine position for radiation therapy.  A vacuum lock pillow device was custom fabricated to position his legs in a reproducible immobilized position.  Then, I performed a urethrogram under sterile conditions to identify the prostatic bed.  CT images were obtained.  Surface markings were placed.  The CT images were loaded into the planning software.  Then the prostate bed target, pelvic lymph node target and avoidance structures including the rectum, bladder, bowel and hips were contoured.  Treatment planning then occurred.  The radiation prescription was entered and confirmed.  A total of one complex treatment devices were fabricated. I have requested : Intensity Modulated Radiotherapy (IMRT) is medically necessary for this case for the following reason:  Rectal sparing.Marland Kitchen  PLAN:  The patient will receive 45 Gy in 25 fractions of 1.8 Gy, followed by a boost to the prostate bed to a total dose of 68.4 Gy with 13 additional fractions of 1.8 Gy.   ________________________________  Sheral Apley Tammi Klippel, M.D.  This  document serves as a record of services personally performed by Tyler Pita, MD. It was created on his behalf by Wilburn Mylar, a trained medical scribe. The creation of this record is based on the scribe's personal observations and the provider's statements to them. This document has been checked and approved by the attending provider.

## 2018-05-27 ENCOUNTER — Ambulatory Visit: Payer: Federal, State, Local not specified - PPO | Admitting: Radiation Oncology

## 2018-05-27 ENCOUNTER — Inpatient Hospital Stay: Payer: Federal, State, Local not specified - PPO

## 2018-05-27 ENCOUNTER — Encounter: Payer: Self-pay | Admitting: Licensed Clinical Social Worker

## 2018-05-27 ENCOUNTER — Inpatient Hospital Stay
Payer: Federal, State, Local not specified - PPO | Attending: Genetic Counselor | Admitting: Licensed Clinical Social Worker

## 2018-05-27 DIAGNOSIS — Z800 Family history of malignant neoplasm of digestive organs: Secondary | ICD-10-CM | POA: Insufficient documentation

## 2018-05-27 DIAGNOSIS — Z803 Family history of malignant neoplasm of breast: Secondary | ICD-10-CM | POA: Insufficient documentation

## 2018-05-27 DIAGNOSIS — C61 Malignant neoplasm of prostate: Secondary | ICD-10-CM | POA: Diagnosis not present

## 2018-05-27 DIAGNOSIS — Z510 Encounter for antineoplastic radiation therapy: Secondary | ICD-10-CM | POA: Diagnosis not present

## 2018-05-27 DIAGNOSIS — Z1379 Encounter for other screening for genetic and chromosomal anomalies: Secondary | ICD-10-CM

## 2018-05-27 NOTE — Progress Notes (Signed)
REFERRING PROVIDER: Tyler Pita, MD [place], [place] [id number]  PRIMARY PROVIDER:  Darcus Austin, MD  PRIMARY REASON FOR VISIT:  1. Prostate cancer ([place])   2. Family history of breast cancer   3. Family history of oral cancer      HISTORY OF PRESENT ILLNESS:   Mr. Fraticelli, a 67 y.o. male, was seen for a [place] cancer genetics consultation at the request of Dr. Tammi Klippel due to a personal and family history of cancer.  Mr. Cuccaro presents to clinic today to discuss the possibility of a hereditary predisposition to cancer, genetic testing, and to further clarify his future cancer risks, as well as potential cancer risks for family members.   In 2019, at the age of 40, Mr. Deremer was diagnosed with prostate cancer, Gleason Score 4+5. This was treated thus far with prostatectomy and there is planned radiation.  Mr. Hink has had three colonoscopies, 2 polyps were found on his first and the most recent two were normal. He does not smoke, occasionally drinks alcohol.    Past Medical History:  Diagnosis Date  . Chicken pox   . Coronary artery disease    a. 1999 s/p MI with cath/PCI;  b. 05/1999 Ex Cardiolite EF 68%, no ishcemia.  . Dyslipidemia   . Family history of breast cancer   . Family history of oral cancer   . History of tobacco abuse   . Hypertension   . Hypokalemia   . Injury of cervical spine ([place])    a. 02/2012 C4/5  . Measles   . Mumps   . Nasal bones, closed fracture    a. 02/2012 in setting of presyncope/fall  . Near syncope   . Pneumonia   . Prostate cancer ([place])   . Whooping cough     Past Surgical History:  Procedure Laterality Date  . [place]  . ANTERIOR CERVICAL DECOMP/DISCECTOMY FUSION  03/13/2012   Procedure: ANTERIOR CERVICAL DECOMPRESSION/DISCECTOMY FUSION 1 LEVEL;  Surgeon: Erline Levine, MD;  Location: [facility] ORS;  Service: Neurosurgery;  Laterality: N/A;  Anterior Cervical Decompression/Fusion. Cervical  four-five.  [facility] Kitchen CARDIAC CATHETERIZATION  06/28/1998   single vessel CAD involving the distal RCA/PTCA and stenting of the distal RCA//EF- 50-55%  . CLOSED REDUCTION NASAL FRACTURE  03/13/2012   Procedure: CLOSED REDUCTION NASAL FRACTURE;  Surgeon: Theodoro Kos, DO;  Location: [place] [facility]UR[facility] ORS;  Service: Plastics;  Laterality: N/A;  Internal and external splinting of nasal fracture  . LYMPHADENECTOMY Bilateral 02/14/2018   Procedure: [facility];  Surgeon: Raynelle Bring, MD;  Location: [facility] OR[facility];  Service: Urology;  Laterality: Bilateral;  . NM MYOVIEW LTD  05/17/2009   normal stress nuclear study/no evidence of ischemia/EF- 68%  . petalla tendon surgery  1989  . ROBOT ASSISTED LAPAROSCOPIC RADICAL PROSTATECTOMY N/A 02/14/2018   Procedure: XI ROBOTIC ASSISTED LAPAROSCOPIC RADICAL PROSTATECTOMY;  Surgeon: Raynelle Bring, MD;  Location: [facility];  Service: Urology;  Laterality: N/A;    Social History   Socioeconomic History  . Marital status: Married    Spouse name: Not on file  . Number of children: Not on file  . Years of education: Not on file  . Highest education level: Not on file  Occupational History  . 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 Financial resource strain: Not on file  . Food insecurity:    Worry: Not on file    Inability: Not on file  . Transportation  needs:    Medical: Not on file    Non-medical: Not on file  Tobacco Use  . Smoking status: Former Smoker    Packs/day: 1.00    Years: 20.00    Pack years: 20.00    Types: Cigarettes    Last attempt to quit: 11/01/1988    Years since quitting: 29.5  . Smokeless tobacco: Never Used  Substance and Sexual Activity  . Alcohol use: Yes    Comment: occasional alcoholic beverage.  . Drug use: No  . Sexual activity: Yes  Lifestyle  . Physical activity:    Days per week: Not on file    Minutes per session: Not on file  . Stress: Not on file  Relationships  . Social connections:     Talks on phone: Not on file    Gets together: Not on file    Attends religious service: Not on file    Active member of club or organization: Not on file    Attends meetings of clubs or organizations: Not on file    Relationship status: Not on file  Other Topics Concern  . Not on file  Social History Narrative   Married and lives with wife in [place].  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  Exercises 2-3 days/wk without limitations.     FAMILY HISTORY:  We obtained a detailed, 4-generation family history.  Significant diagnoses are listed below: Family History  Problem Relation Age of Onset  . Heart attack Father        20  . Breast cancer Mother        dx late 81s, d. 32  . Cancer Sister        oral cancer dx 56  . Breast cancer Daughter        dx. 28, currently being treated   Mr. Stejskal has three daughters and two sons. His daughters are 70, 78, and 73 and his sons are 33 and 4. His 39 year old daughter was recently diagnosed with breast cancer. The patient believes she has had genetic testing but is not sure. She is being treated in [facility]. No cancers for his other children/grandchildren.  Mr. Ketcham has three sisters and one brother. His sister, Malachy Mood, is 5 and was diagnosed with oral cancer at 33. He has a sister, Edwena Felty, who is 62, a sister Manuela Schwartz who is 13 and his brother is 41. No cancers for his nieces or nephews.  Mr. Briski father died at 41 of a heart attack. He had 10 siblings. No cancers to the patient's knowledge. The patient does not know of any cancers in his paternal cousins but also said he does not really know them. His paternal grandmother died of old age at 35. He does not have any information about his grandfather.  Mr. Kimberlin mother was diagnosed with breast cancer in her late 62's and died at 20. She had five sisters. Mr. Witz does not know of any cancers in these aunts or his maternal cousins, but he also says he does not know these individuals well.  His maternal grandmother died of diabetes complications in her late 107's, and his grandfather was killed in his 4's.   Mr. Eltzroth is unaware of previous family history of genetic testing for hereditary cancer risks. Patient's maternal ancestors are of Native American/African American descent, and paternal ancestors are of African American descent. There is no reported Ashkenazi Jewish ancestry. There is no known consanguinity.  GENETIC COUNSELING ASSESSMENT: [name]  is a 67 y.o. male with a personal and family history which is somewhat suggestive of a Hereditary Cancer Predisposition Syndrome. We, therefore, discussed and recommended the following at today's visit.   DISCUSSION: We discussed that about 5-10% of cancer cases are hereditary. We discussed the BRCA1 and BRCA2 genes in particular given his family history of breast cancer. We reviewed the characteristics, features and inheritance patterns of hereditary cancer syndromes. We also discussed genetic testing, including the appropriate family members to test, the process of testing, insurance coverage and turn-around-time for results. We discussed the implications of a negative, positive and/or variant of uncertain significant result. We recommended Mr. Erhart pursue genetic testing for the [facility] Multi-Cancer Panel.  The Multi-Cancer Panel offered by [facility] includes sequencing and/or deletion duplication testing of the following 84 genes: AIP, ALK, APC, ATM, AXIN2,BAP1,  BARD1, BLM, BMPR1A, BRCA1, BRCA2, BRIP1, CASR, CDC73, CDH1, CDK4, CDKN1B, CDKN1C, CDKN2A (p14ARF), CDKN2A (p16INK4a), CEBPA, CHEK2, CTNNA1, DICER1, DIS3L2, EGFR (c.2369C>T, p.Thr790Met variant only), EPCAM (Deletion/duplication testing only), FH, FLCN, GATA2, GPC3, GREM1 (Promoter region deletion/duplication testing only), HOXB13 (c.251G>A, p.Gly84Glu), HRAS, KIT, MAX, MEN1, MET, MITF (c.952G>A, p.Glu318Lys variant only), MLH1, MSH2, MSH3, MSH6, MUTYH, NBN, NF1, NF2, NTHL1,  PALB2, PDGFRA, PHOX2B, PMS2, POLD1, POLE, POT1, PRKAR1A, PTCH1, PTEN, RAD50, RAD51C, RAD51D, RB1, RECQL4, RET, RUNX1, SDHAF2, SDHA (sequence changes only), SDHB, SDHC, SDHD, SMAD4, SMARCA4, SMARCB1, SMARCE1, STK11, SUFU, TERC, TERT, TMEM127, TP53, TSC1, TSC2, VHL, WRN and WT1.   We discussed that if he is found to have a mutation in one of these genes, it may impact future medical management recommendations such as increased cancer screenings and consideration of risk reducing surgeries.  A positive result could also have implications for the patient's family members.  A Negative result would mean we were unable to identify a hereditary component to his personal and family history of cancer but does not rule out the possibility of a hereditary basis for his personal and family history of cancer.  There could be mutations that are undetectable by current technology, or in genes not yet tested or identified to increase cancer risk.    We discussed the potential to find a Variant of Uncertain Significance or VUS.  These are variants that have not yet been identified as pathogenic or benign, and it is unknown if this variant is associated with increased cancer risk or if this is a normal finding.  Most VUS's are reclassified to benign or likely benign.   It should not be used to make medical management decisions. With time, we suspect the lab will determine the significance of any VUS's identified if any.   Based on Mr. Rami personal history of prostate cancer with Gleason score 4+5 and family history of breast cancer, he meets NCCN medical criteria for genetic testing. Despite that he meets criteria, he may still have an out of pocket cost. The lab will provide him with an OOP cost.   PLAN: After considering the risks, benefits, and limitations, Mr. Trapp  provided informed consent to pursue genetic testing and the blood sample was sent to [facility] for analysis of the Multi-Cancer Panel..  Results should be available within approximately 2-3 weeks' time, at which point they will be disclosed by telephone to Mr. Lindholm, as will any additional recommendations warranted by these results. Mr. Kohles will receive a summary of his genetic counseling visit and a copy of his results once available. This information will also be available in Epic.  Based on Mr. Chavarin family  history, we recommended his daughter with breast cancer have genetic counseling and testing regardless of his results, if she has not had this done yet. Mr. Taglieri will let us know if we can be of any assistance in coordinating genetic counseling and/or testing for this family member.   Lastly, we encouraged Mr. Marengo to remain in contact with cancer genetics annually so that we can continuously update the family history and inform him of any changes in cancer genetics and testing that may be of benefit for this family.   Mr.  Mimbs questions were answered to his satisfaction today. Our contact information was provided should additional questions or concerns arise. Thank you for the referral and allowing [place] to share in the care of your patient.   Faith Rogue, MS Genetic Counselor [email or web address] Phone: [phone]  The patient was seen for a total of 40 minutes in face-to-face genetic counseling.  The patient was accompanied today by his wife, Threasa Beards.

## 2018-05-28 ENCOUNTER — Encounter: Payer: Self-pay | Admitting: Medical Oncology

## 2018-05-28 ENCOUNTER — Ambulatory Visit
Admission: RE | Admit: 2018-05-28 | Discharge: 2018-05-28 | Disposition: A | Discharge status: Home / Self Care | Payer: Federal, State, Local not specified - PPO | Source: Ambulatory Visit | Attending: Radiation Oncology | Admitting: Radiation Oncology

## 2018-05-28 DIAGNOSIS — Z510 Encounter for antineoplastic radiation therapy: Secondary | ICD-10-CM | POA: Diagnosis not present

## 2018-05-28 DIAGNOSIS — C61 Malignant neoplasm of prostate: Secondary | ICD-10-CM | POA: Diagnosis not present

## 2018-05-29 ENCOUNTER — Ambulatory Visit
Admission: RE | Admit: 2018-05-29 | Discharge: 2018-05-29 | Disposition: A | Discharge status: Home / Self Care | Payer: Federal, State, Local not specified - PPO | Source: Ambulatory Visit | Attending: Radiation Oncology | Admitting: Radiation Oncology

## 2018-05-29 DIAGNOSIS — Z510 Encounter for antineoplastic radiation therapy: Secondary | ICD-10-CM | POA: Diagnosis not present

## 2018-05-29 DIAGNOSIS — C61 Malignant neoplasm of prostate: Secondary | ICD-10-CM | POA: Diagnosis not present

## 2018-05-30 ENCOUNTER — Ambulatory Visit
Admission: RE | Admit: 2018-05-30 | Discharge: 2018-05-30 | Disposition: A | Discharge status: Home / Self Care | Payer: Federal, State, Local not specified - PPO | Source: Ambulatory Visit | Attending: Radiation Oncology | Admitting: Radiation Oncology

## 2018-05-30 DIAGNOSIS — C61 Malignant neoplasm of prostate: Secondary | ICD-10-CM | POA: Diagnosis not present

## 2018-05-30 DIAGNOSIS — Z510 Encounter for antineoplastic radiation therapy: Secondary | ICD-10-CM | POA: Diagnosis not present

## 2018-05-31 ENCOUNTER — Ambulatory Visit
Admission: RE | Admit: 2018-05-31 | Discharge: 2018-05-31 | Disposition: A | Discharge status: Home / Self Care | Payer: Federal, State, Local not specified - PPO | Source: Ambulatory Visit | Attending: Radiation Oncology | Admitting: Radiation Oncology

## 2018-05-31 DIAGNOSIS — Z510 Encounter for antineoplastic radiation therapy: Secondary | ICD-10-CM | POA: Insufficient documentation

## 2018-05-31 DIAGNOSIS — C61 Malignant neoplasm of prostate: Secondary | ICD-10-CM | POA: Insufficient documentation

## 2018-06-03 ENCOUNTER — Ambulatory Visit
Admission: RE | Admit: 2018-06-03 | Discharge: 2018-06-03 | Disposition: A | Discharge status: Home / Self Care | Payer: Federal, State, Local not specified - PPO | Source: Ambulatory Visit | Attending: Radiation Oncology | Admitting: Radiation Oncology

## 2018-06-03 DIAGNOSIS — C61 Malignant neoplasm of prostate: Secondary | ICD-10-CM | POA: Diagnosis not present

## 2018-06-03 DIAGNOSIS — Z510 Encounter for antineoplastic radiation therapy: Secondary | ICD-10-CM | POA: Diagnosis not present

## 2018-06-04 ENCOUNTER — Ambulatory Visit
Admission: RE | Admit: 2018-06-04 | Discharge: 2018-06-04 | Disposition: A | Discharge status: Home / Self Care | Payer: Federal, State, Local not specified - PPO | Source: Ambulatory Visit | Attending: Radiation Oncology | Admitting: Radiation Oncology

## 2018-06-04 DIAGNOSIS — Z510 Encounter for antineoplastic radiation therapy: Secondary | ICD-10-CM | POA: Diagnosis not present

## 2018-06-04 DIAGNOSIS — C61 Malignant neoplasm of prostate: Secondary | ICD-10-CM | POA: Diagnosis not present

## 2018-06-05 ENCOUNTER — Ambulatory Visit
Admission: RE | Admit: 2018-06-05 | Discharge: 2018-06-05 | Disposition: A | Discharge status: Home / Self Care | Payer: Federal, State, Local not specified - PPO | Source: Ambulatory Visit | Attending: Radiation Oncology | Admitting: Radiation Oncology

## 2018-06-05 DIAGNOSIS — Z510 Encounter for antineoplastic radiation therapy: Secondary | ICD-10-CM | POA: Diagnosis not present

## 2018-06-05 DIAGNOSIS — C61 Malignant neoplasm of prostate: Secondary | ICD-10-CM | POA: Diagnosis not present

## 2018-06-06 ENCOUNTER — Ambulatory Visit
Admission: RE | Admit: 2018-06-06 | Discharge: 2018-06-06 | Disposition: A | Discharge status: Home / Self Care | Payer: Federal, State, Local not specified - PPO | Source: Ambulatory Visit | Attending: Radiation Oncology | Admitting: Radiation Oncology

## 2018-06-06 DIAGNOSIS — C61 Malignant neoplasm of prostate: Secondary | ICD-10-CM | POA: Diagnosis not present

## 2018-06-06 DIAGNOSIS — Z510 Encounter for antineoplastic radiation therapy: Secondary | ICD-10-CM | POA: Diagnosis not present

## 2018-06-07 ENCOUNTER — Ambulatory Visit
Admission: RE | Admit: 2018-06-07 | Discharge: 2018-06-07 | Disposition: A | Discharge status: Home / Self Care | Payer: Federal, State, Local not specified - PPO | Source: Ambulatory Visit | Attending: Radiation Oncology | Admitting: Radiation Oncology

## 2018-06-07 DIAGNOSIS — C61 Malignant neoplasm of prostate: Secondary | ICD-10-CM | POA: Diagnosis not present

## 2018-06-07 DIAGNOSIS — Z510 Encounter for antineoplastic radiation therapy: Secondary | ICD-10-CM | POA: Diagnosis not present

## 2018-06-10 ENCOUNTER — Ambulatory Visit
Admission: RE | Admit: 2018-06-10 | Discharge: 2018-06-10 | Disposition: A | Discharge status: Home / Self Care | Payer: Federal, State, Local not specified - PPO | Source: Ambulatory Visit | Attending: Radiation Oncology | Admitting: Radiation Oncology

## 2018-06-10 ENCOUNTER — Telehealth: Payer: Self-pay | Admitting: Licensed Clinical Social Worker

## 2018-06-10 DIAGNOSIS — Z510 Encounter for antineoplastic radiation therapy: Secondary | ICD-10-CM | POA: Diagnosis not present

## 2018-06-10 DIAGNOSIS — C61 Malignant neoplasm of prostate: Secondary | ICD-10-CM | POA: Diagnosis not present

## 2018-06-11 ENCOUNTER — Encounter: Payer: Self-pay | Admitting: Licensed Clinical Social Worker

## 2018-06-11 ENCOUNTER — Ambulatory Visit: Payer: Self-pay | Admitting: Licensed Clinical Social Worker

## 2018-06-11 ENCOUNTER — Ambulatory Visit
Admission: RE | Admit: 2018-06-11 | Discharge: 2018-06-11 | Disposition: A | Discharge status: Home / Self Care | Payer: Federal, State, Local not specified - PPO | Source: Ambulatory Visit | Attending: Radiation Oncology | Admitting: Radiation Oncology

## 2018-06-11 DIAGNOSIS — C61 Malignant neoplasm of prostate: Secondary | ICD-10-CM | POA: Diagnosis not present

## 2018-06-11 DIAGNOSIS — Z510 Encounter for antineoplastic radiation therapy: Secondary | ICD-10-CM | POA: Diagnosis not present

## 2018-06-11 DIAGNOSIS — Z1379 Encounter for other screening for genetic and chromosomal anomalies: Secondary | ICD-10-CM | POA: Insufficient documentation

## 2018-06-11 NOTE — Progress Notes (Signed)
HPI:  [name] was previously seen in the [facility] clinic on 05/27/2018 due to a personal and family history of cancer and concerns regarding a hereditary predisposition to cancer. Please refer to our prior cancer genetics clinic note for more information regarding [name] medical, social and family histories, and our assessment and recommendations, at the time. [name] recent genetic test results were disclosed to him, as well as recommendations warranted by these results. These results and recommendations are discussed in more detail below.   FAMILY HISTORY:  We obtained a detailed, 4-generation family history.  Significant diagnoses are listed below: Family History  Problem Relation Age of Onset  . Heart attack Father        39  . Breast cancer Mother        dx late 39s, d. 3  . Cancer Sister        oral cancer dx 18  . Breast cancer Daughter        dx. 33, currently being treated    Edward [name] has three daughters and two sons. His daughters are 17, 7, and 36 and his sons are 78 and 20. His 32 year old daughter was recently diagnosed with breast cancer. The patient believes she has had genetic testing but is not sure. She is being treated in [facility]. No cancers for his other children/grandchildren.  Edward [name] has three sisters and one brother. His sister, Edward [name], is 63 and was diagnosed with oral cancer at 81. He has a sister, Edward [name], who is 45, a sister Edward [name] who is 13 and his brother is 48. No cancers for his nieces or nephews.  Edward [name] father died at 24 of a heart attack. He had 10 siblings. No cancers to the patient's knowledge. The patient does not know of any cancers in his paternal cousins but also said he does not really know them. His paternal grandmother died of old age at 30. He does not have any information about his grandfather.  Edward [name] mother was diagnosed with breast cancer in her late 14's and died at 54. She had five  sisters. Edward [name] does not know of any cancers in these aunts or his maternal cousins, but he also says he does not know these individuals well. His maternal grandmother died of diabetes complications in her late 85's, and his grandfather was killed in his 70's.   Edward [name] is unaware of previous family history of genetic testing for hereditary cancer risks. Patient's maternal ancestors are of Native American/African American descent, and paternal ancestors are of African American descent. There is no reported Ashkenazi Jewish ancestry. There is no known consanguinity  GENETIC TEST RESULTS: Genetic testing performed through [facility]'s Multi-Cancer Panel reported out on 06/04/2018 showed no pathogenic mutations. The Multi-Cancer Panel offered by [facility] includes sequencing and/or deletion duplication testing of the following 84 genes: AIP, ALK, APC, ATM, AXIN2,BAP1,  BARD1, BLM, BMPR1A, BRCA1, BRCA2, BRIP1, CASR, CDC73, CDH1, CDK4, CDKN1B, CDKN1C, CDKN2A (p14ARF), CDKN2A (p16INK4a), CEBPA, CHEK2, CTNNA1, DICER1, DIS3L2, EGFR (c.2369C>T, p.Thr790Met variant only), EPCAM (Deletion/duplication testing only), FH, FLCN, GATA2, GPC3, GREM1 (Promoter region deletion/duplication testing only), HOXB13 (c.251G>A, p.Gly84Glu), HRAS, KIT, MAX, MEN1, MET, MITF (c.952G>A, p.Glu318Lys variant only), MLH1, MSH2, Edward [name], MSH6, MUTYH, NBN, NF1, NF2, NTHL1, PALB2, PDGFRA, PHOX2B, PMS2, POLD1, POLE, POT1, PRKAR1A, PTCH1, PTEN, RAD50, RAD51C, RAD51D, RB1, RECQL4, RET, RUNX1, SDHAF2, SDHA (sequence changes only), SDHB, SDHC, SDHD, SMAD4, SMARCA4, SMARCB1, SMARCE1, STK11, SUFU, TERC, TERT, TMEM127, TP53, TSC1, TSC2, VHL, WRN  and WT1.  A variant of uncertain significance (VUS) in a gene called Edward [name] was also noted.  A variant of uncertain significance (VUS) in a gene called POLE was also noted.   The test report will be scanned into EPIC and will be located under the Molecular Pathology section of the Results Review tab. A portion  of the result report is included below for reference.      We discussed with [name] that because current genetic testing is not perfect, it is possible there may be a gene mutation in one of these genes that current testing cannot detect, but that chance is small.  We also discussed, that there could be another gene that has not yet been discovered, or that we have not yet tested, that is responsible for the cancer diagnoses in the family. It is also possible there is a hereditary cause for the cancer in the family that [name] did not inherit and therefore was not identified in his testing.  Therefore, it is important to remain in touch with cancer genetics in the future so that we can continue to offer [name] the most up to date genetic testing.   Regarding the VUS's in Edward [name], POLE: At this time, it is unknown if these variants are associated with increased cancer risk or if they are normal findings, but most variants such as these get reclassified to being inconsequential. They should not be used to make medical management decisions. With time, we suspect the lab will determine the significance of these variants, if any. If we do learn more about them, we will try to contact [name] to discuss it further. However, it is important to stay in touch with [place] periodically and keep the address and phone number up to date.  ADDITIONAL GENETIC TESTING: We discussed with [name] that his genetic testing was fairly extensive.  If there are are genes identified to increase cancer risk that can be analyzed in the future, we would be happy to discuss and coordinate this testing at that time.    CANCER SCREENING RECOMMENDATIONS: [name] test result is considered negative (normal).  This means that we have not identified a hereditary cause for his personal and family history of cancer at this time.   This normal indicates that it is unlikely [name] has an increased risk of cancer due to a  mutation in one of these genes. This normal test also suggests that [name] cancer was most likely not due to an inherited predisposition associated with one of these genes.  Most cancers happen by chance and this negative test suggests that his cancer may fall into this category.While reassuring, this does not definitively rule out a hereditary predisposition to cancer. It is still possible that there could be genetic mutations that are undetectable by current technology, or genetic mutations in genes that have not been tested or identified to increase cancer risk.  Therefore, it is recommended he continue to follow the cancer management and screening guidelines provided by his oncology and primary healthcare provider. An individual's cancer risk is not determined by genetic test results alone.  Overall cancer risk assessment includes additional factors such as personal medical history, family history, etc.  These should be used to make a personalized plan for cancer prevention and surveillance.    RECOMMENDATIONS FOR FAMILY MEMBERS:  Relatives in this family might be at some increased risk of developing cancer, over the general population risk, simply due to the  family history of cancer.  We recommended women in this family have a yearly mammogram beginning at age 75, or 22 years younger than the earliest onset of cancer, an annual clinical breast exam, and perform monthly breast self-exams. Women in this family should also have a gynecological exam as recommended by their primary provider. All family members should have a colonoscopy by age 45 (or as directed by their doctors).  All family members should inform their physicians about the family history of cancer so their doctors can make the most appropriate screening recommendations for them.   It is also possible there is a hereditary cause for the cancer in [name] family that he did not inherit and therefore was not identified in him.  We  recommended his daughter who was diagnosed with breast cancer at 27, have genetic counseling and testing if she has not already done so. [name] will let us know if we can be of any assistance in coordinating genetic counseling and/or testing for this family member.  FOLLOW-UP: Lastly, we discussed with [name] that cancer genetics is a rapidly advancing field and it is possible that new genetic tests will be appropriate for him and/or his family members in the future. We encouraged him to remain in contact with cancer genetics on an annual basis so we can update his personal and family histories and let him know of advances in cancer genetics that may benefit this family.   Our contact number was provided. [name] questions were answered to his satisfaction, and he knows he is welcome to call us at anytime with additional questions or concerns.  Faith Rogue, MS Genetic Counselor [email or web address] Phone: [phone]

## 2018-06-11 NOTE — Telephone Encounter (Signed)
Revealed negative genetic testing.  Revealed that VUSes in MSH3 and POLE were identified. This normal result is reassuring and indicates that it is unlikely Edward [name] cancer is due to a hereditary cause.  It is unlikely that there is an increased risk of another cancer due to a mutation in one of these genes.  However, genetic testing is not perfect, and cannot definitively rule out a hereditary cause.  It will be important for him to keep in contact with genetics to learn if any additional testing may be needed in the future. We discussed continuing to follow doctors recommendations for cancer screening, and that if his daughter with young breast cancer has not been tested that we are happy to see her.

## 2018-06-12 ENCOUNTER — Ambulatory Visit
Admission: RE | Admit: 2018-06-12 | Discharge: 2018-06-12 | Disposition: A | Discharge status: Home / Self Care | Payer: Federal, State, Local not specified - PPO | Source: Ambulatory Visit | Attending: Radiation Oncology | Admitting: Radiation Oncology

## 2018-06-12 DIAGNOSIS — Z510 Encounter for antineoplastic radiation therapy: Secondary | ICD-10-CM | POA: Diagnosis not present

## 2018-06-12 DIAGNOSIS — C61 Malignant neoplasm of prostate: Secondary | ICD-10-CM | POA: Diagnosis not present

## 2018-06-13 ENCOUNTER — Ambulatory Visit
Admission: RE | Admit: 2018-06-13 | Discharge: 2018-06-13 | Disposition: A | Discharge status: Home / Self Care | Payer: Federal, State, Local not specified - PPO | Source: Ambulatory Visit | Attending: Radiation Oncology | Admitting: Radiation Oncology

## 2018-06-13 DIAGNOSIS — Z510 Encounter for antineoplastic radiation therapy: Secondary | ICD-10-CM | POA: Diagnosis not present

## 2018-06-13 DIAGNOSIS — C61 Malignant neoplasm of prostate: Secondary | ICD-10-CM | POA: Diagnosis not present

## 2018-06-14 ENCOUNTER — Ambulatory Visit
Admission: RE | Admit: 2018-06-14 | Discharge: 2018-06-14 | Disposition: A | Discharge status: Home / Self Care | Payer: Federal, State, Local not specified - PPO | Source: Ambulatory Visit | Attending: Radiation Oncology | Admitting: Radiation Oncology

## 2018-06-14 DIAGNOSIS — C61 Malignant neoplasm of prostate: Secondary | ICD-10-CM | POA: Diagnosis not present

## 2018-06-14 DIAGNOSIS — Z510 Encounter for antineoplastic radiation therapy: Secondary | ICD-10-CM | POA: Diagnosis not present

## 2018-06-17 ENCOUNTER — Ambulatory Visit
Admission: RE | Admit: 2018-06-17 | Discharge: 2018-06-17 | Disposition: A | Discharge status: Home / Self Care | Payer: Federal, State, Local not specified - PPO | Source: Ambulatory Visit | Attending: Radiation Oncology | Admitting: Radiation Oncology

## 2018-06-17 DIAGNOSIS — C61 Malignant neoplasm of prostate: Secondary | ICD-10-CM | POA: Diagnosis not present

## 2018-06-17 DIAGNOSIS — Z510 Encounter for antineoplastic radiation therapy: Secondary | ICD-10-CM | POA: Diagnosis not present

## 2018-06-18 ENCOUNTER — Ambulatory Visit
Admission: RE | Admit: 2018-06-18 | Discharge: 2018-06-18 | Disposition: A | Discharge status: Home / Self Care | Payer: Federal, State, Local not specified - PPO | Source: Ambulatory Visit | Attending: Radiation Oncology | Admitting: Radiation Oncology

## 2018-06-18 DIAGNOSIS — Z510 Encounter for antineoplastic radiation therapy: Secondary | ICD-10-CM | POA: Diagnosis not present

## 2018-06-18 DIAGNOSIS — C61 Malignant neoplasm of prostate: Secondary | ICD-10-CM | POA: Diagnosis not present

## 2018-06-18 NOTE — Progress Notes (Signed)
FMLA successfully faxed to [facility] specialist at [phone]. Mailed copy to patient address on file.

## 2018-06-19 ENCOUNTER — Ambulatory Visit
Admission: RE | Admit: 2018-06-19 | Discharge: 2018-06-19 | Disposition: A | Discharge status: Home / Self Care | Payer: Federal, State, Local not specified - PPO | Source: Ambulatory Visit | Attending: Radiation Oncology | Admitting: Radiation Oncology

## 2018-06-19 DIAGNOSIS — C61 Malignant neoplasm of prostate: Secondary | ICD-10-CM | POA: Diagnosis not present

## 2018-06-19 DIAGNOSIS — Z510 Encounter for antineoplastic radiation therapy: Secondary | ICD-10-CM | POA: Diagnosis not present

## 2018-06-20 ENCOUNTER — Ambulatory Visit
Admission: RE | Admit: 2018-06-20 | Discharge: 2018-06-20 | Disposition: A | Discharge status: Home / Self Care | Payer: Federal, State, Local not specified - PPO | Source: Ambulatory Visit | Attending: Radiation Oncology | Admitting: Radiation Oncology

## 2018-06-20 DIAGNOSIS — C61 Malignant neoplasm of prostate: Secondary | ICD-10-CM | POA: Diagnosis not present

## 2018-06-20 DIAGNOSIS — Z510 Encounter for antineoplastic radiation therapy: Secondary | ICD-10-CM | POA: Diagnosis not present

## 2018-06-21 ENCOUNTER — Ambulatory Visit
Admission: RE | Admit: 2018-06-21 | Discharge: 2018-06-21 | Disposition: A | Discharge status: Home / Self Care | Payer: Federal, State, Local not specified - PPO | Source: Ambulatory Visit | Attending: Radiation Oncology | Admitting: Radiation Oncology

## 2018-06-21 DIAGNOSIS — C61 Malignant neoplasm of prostate: Secondary | ICD-10-CM | POA: Diagnosis not present

## 2018-06-21 DIAGNOSIS — Z510 Encounter for antineoplastic radiation therapy: Secondary | ICD-10-CM | POA: Diagnosis not present

## 2018-06-23 ENCOUNTER — Ambulatory Visit
Admission: RE | Admit: 2018-06-23 | Discharge: 2018-06-23 | Disposition: A | Discharge status: Home / Self Care | Payer: Federal, State, Local not specified - PPO | Source: Ambulatory Visit | Attending: Radiation Oncology | Admitting: Radiation Oncology

## 2018-06-23 DIAGNOSIS — C61 Malignant neoplasm of prostate: Secondary | ICD-10-CM | POA: Diagnosis not present

## 2018-06-23 DIAGNOSIS — Z510 Encounter for antineoplastic radiation therapy: Secondary | ICD-10-CM | POA: Diagnosis not present

## 2018-06-24 ENCOUNTER — Ambulatory Visit
Admission: RE | Admit: 2018-06-24 | Discharge: 2018-06-24 | Disposition: A | Discharge status: Home / Self Care | Payer: Federal, State, Local not specified - PPO | Source: Ambulatory Visit | Attending: Radiation Oncology | Admitting: Radiation Oncology

## 2018-06-24 DIAGNOSIS — Z510 Encounter for antineoplastic radiation therapy: Secondary | ICD-10-CM | POA: Diagnosis not present

## 2018-06-24 DIAGNOSIS — C61 Malignant neoplasm of prostate: Secondary | ICD-10-CM | POA: Diagnosis not present

## 2018-06-25 ENCOUNTER — Ambulatory Visit
Admission: RE | Admit: 2018-06-25 | Discharge: 2018-06-25 | Disposition: A | Discharge status: Home / Self Care | Payer: Federal, State, Local not specified - PPO | Source: Ambulatory Visit | Attending: Radiation Oncology | Admitting: Radiation Oncology

## 2018-06-25 DIAGNOSIS — Z510 Encounter for antineoplastic radiation therapy: Secondary | ICD-10-CM | POA: Diagnosis not present

## 2018-06-25 DIAGNOSIS — C61 Malignant neoplasm of prostate: Secondary | ICD-10-CM | POA: Diagnosis not present

## 2018-06-26 ENCOUNTER — Ambulatory Visit
Admission: RE | Admit: 2018-06-26 | Discharge: 2018-06-26 | Disposition: A | Discharge status: Home / Self Care | Payer: Federal, State, Local not specified - PPO | Source: Ambulatory Visit | Attending: Radiation Oncology | Admitting: Radiation Oncology

## 2018-06-26 DIAGNOSIS — Z510 Encounter for antineoplastic radiation therapy: Secondary | ICD-10-CM | POA: Diagnosis not present

## 2018-06-26 DIAGNOSIS — C61 Malignant neoplasm of prostate: Secondary | ICD-10-CM | POA: Diagnosis not present

## 2018-07-01 ENCOUNTER — Ambulatory Visit
Admission: RE | Admit: 2018-07-01 | Discharge: 2018-07-01 | Disposition: A | Discharge status: Home / Self Care | Payer: Federal, State, Local not specified - PPO | Source: Ambulatory Visit | Attending: Radiation Oncology | Admitting: Radiation Oncology

## 2018-07-01 DIAGNOSIS — Z510 Encounter for antineoplastic radiation therapy: Secondary | ICD-10-CM | POA: Diagnosis not present

## 2018-07-01 DIAGNOSIS — C61 Malignant neoplasm of prostate: Secondary | ICD-10-CM | POA: Insufficient documentation

## 2018-07-02 ENCOUNTER — Ambulatory Visit
Admission: RE | Admit: 2018-07-02 | Discharge: 2018-07-02 | Disposition: A | Discharge status: Home / Self Care | Payer: Federal, State, Local not specified - PPO | Source: Ambulatory Visit | Attending: Radiation Oncology | Admitting: Radiation Oncology

## 2018-07-02 DIAGNOSIS — Z510 Encounter for antineoplastic radiation therapy: Secondary | ICD-10-CM | POA: Diagnosis not present

## 2018-07-02 DIAGNOSIS — C61 Malignant neoplasm of prostate: Secondary | ICD-10-CM | POA: Diagnosis not present

## 2018-07-03 ENCOUNTER — Ambulatory Visit: Payer: Federal, State, Local not specified - PPO

## 2018-07-03 ENCOUNTER — Ambulatory Visit
Admission: RE | Admit: 2018-07-03 | Discharge: 2018-07-03 | Disposition: A | Discharge status: Home / Self Care | Payer: Federal, State, Local not specified - PPO | Source: Ambulatory Visit | Attending: Radiation Oncology | Admitting: Radiation Oncology

## 2018-07-03 DIAGNOSIS — C61 Malignant neoplasm of prostate: Secondary | ICD-10-CM | POA: Diagnosis not present

## 2018-07-03 DIAGNOSIS — Z510 Encounter for antineoplastic radiation therapy: Secondary | ICD-10-CM | POA: Diagnosis not present

## 2018-07-04 ENCOUNTER — Ambulatory Visit
Admission: RE | Admit: 2018-07-04 | Discharge: 2018-07-04 | Disposition: A | Discharge status: Home / Self Care | Payer: Federal, State, Local not specified - PPO | Source: Ambulatory Visit | Attending: Radiation Oncology | Admitting: Radiation Oncology

## 2018-07-04 ENCOUNTER — Ambulatory Visit: Payer: Federal, State, Local not specified - PPO

## 2018-07-04 DIAGNOSIS — C61 Malignant neoplasm of prostate: Secondary | ICD-10-CM | POA: Diagnosis not present

## 2018-07-04 DIAGNOSIS — Z510 Encounter for antineoplastic radiation therapy: Secondary | ICD-10-CM | POA: Diagnosis not present

## 2018-07-05 ENCOUNTER — Ambulatory Visit: Payer: Federal, State, Local not specified - PPO

## 2018-07-05 ENCOUNTER — Ambulatory Visit
Admission: RE | Admit: 2018-07-05 | Discharge: 2018-07-05 | Disposition: A | Discharge status: Home / Self Care | Payer: Federal, State, Local not specified - PPO | Source: Ambulatory Visit | Attending: Radiation Oncology | Admitting: Radiation Oncology

## 2018-07-05 DIAGNOSIS — Z510 Encounter for antineoplastic radiation therapy: Secondary | ICD-10-CM | POA: Diagnosis not present

## 2018-07-05 DIAGNOSIS — C61 Malignant neoplasm of prostate: Secondary | ICD-10-CM | POA: Diagnosis not present

## 2018-07-08 ENCOUNTER — Ambulatory Visit
Admission: RE | Admit: 2018-07-08 | Discharge: 2018-07-08 | Disposition: A | Discharge status: Home / Self Care | Payer: Federal, State, Local not specified - PPO | Source: Ambulatory Visit | Attending: Radiation Oncology | Admitting: Radiation Oncology

## 2018-07-08 DIAGNOSIS — Z510 Encounter for antineoplastic radiation therapy: Secondary | ICD-10-CM | POA: Diagnosis not present

## 2018-07-08 DIAGNOSIS — C61 Malignant neoplasm of prostate: Secondary | ICD-10-CM | POA: Diagnosis not present

## 2018-07-09 ENCOUNTER — Encounter: Payer: Self-pay | Admitting: Medical Oncology

## 2018-07-09 ENCOUNTER — Ambulatory Visit
Admission: RE | Admit: 2018-07-09 | Discharge: 2018-07-09 | Disposition: A | Discharge status: Home / Self Care | Payer: Federal, State, Local not specified - PPO | Source: Ambulatory Visit | Attending: Radiation Oncology | Admitting: Radiation Oncology

## 2018-07-09 DIAGNOSIS — Z510 Encounter for antineoplastic radiation therapy: Secondary | ICD-10-CM | POA: Diagnosis not present

## 2018-07-09 DIAGNOSIS — C61 Malignant neoplasm of prostate: Secondary | ICD-10-CM | POA: Diagnosis not present

## 2018-07-10 ENCOUNTER — Ambulatory Visit
Admission: RE | Admit: 2018-07-10 | Discharge: 2018-07-10 | Disposition: A | Discharge status: Home / Self Care | Payer: Federal, State, Local not specified - PPO | Source: Ambulatory Visit | Attending: Radiation Oncology | Admitting: Radiation Oncology

## 2018-07-10 DIAGNOSIS — Z510 Encounter for antineoplastic radiation therapy: Secondary | ICD-10-CM | POA: Diagnosis not present

## 2018-07-10 DIAGNOSIS — C61 Malignant neoplasm of prostate: Secondary | ICD-10-CM | POA: Diagnosis not present

## 2018-07-11 ENCOUNTER — Ambulatory Visit
Admission: RE | Admit: 2018-07-11 | Discharge: 2018-07-11 | Disposition: A | Discharge status: Home / Self Care | Payer: Federal, State, Local not specified - PPO | Source: Ambulatory Visit | Attending: Radiation Oncology | Admitting: Radiation Oncology

## 2018-07-11 DIAGNOSIS — C61 Malignant neoplasm of prostate: Secondary | ICD-10-CM | POA: Diagnosis not present

## 2018-07-11 DIAGNOSIS — Z510 Encounter for antineoplastic radiation therapy: Secondary | ICD-10-CM | POA: Diagnosis not present

## 2018-07-12 ENCOUNTER — Ambulatory Visit: Payer: Federal, State, Local not specified - PPO

## 2018-07-12 ENCOUNTER — Ambulatory Visit
Admission: RE | Admit: 2018-07-12 | Discharge: 2018-07-12 | Disposition: A | Discharge status: Home / Self Care | Payer: Federal, State, Local not specified - PPO | Source: Ambulatory Visit | Attending: Radiation Oncology | Admitting: Radiation Oncology

## 2018-07-12 DIAGNOSIS — Z510 Encounter for antineoplastic radiation therapy: Secondary | ICD-10-CM | POA: Diagnosis not present

## 2018-07-12 DIAGNOSIS — C61 Malignant neoplasm of prostate: Secondary | ICD-10-CM | POA: Diagnosis not present

## 2018-07-15 ENCOUNTER — Ambulatory Visit
Admission: RE | Admit: 2018-07-15 | Discharge: 2018-07-15 | Disposition: A | Discharge status: Home / Self Care | Payer: Federal, State, Local not specified - PPO | Source: Ambulatory Visit | Attending: Radiation Oncology | Admitting: Radiation Oncology

## 2018-07-15 DIAGNOSIS — C61 Malignant neoplasm of prostate: Secondary | ICD-10-CM | POA: Diagnosis not present

## 2018-07-15 DIAGNOSIS — Z510 Encounter for antineoplastic radiation therapy: Secondary | ICD-10-CM | POA: Diagnosis not present

## 2018-07-16 ENCOUNTER — Ambulatory Visit
Admission: RE | Admit: 2018-07-16 | Discharge: 2018-07-16 | Disposition: A | Discharge status: Home / Self Care | Payer: Federal, State, Local not specified - PPO | Source: Ambulatory Visit | Attending: Radiation Oncology | Admitting: Radiation Oncology

## 2018-07-16 DIAGNOSIS — C61 Malignant neoplasm of prostate: Secondary | ICD-10-CM | POA: Diagnosis not present

## 2018-07-16 DIAGNOSIS — Z510 Encounter for antineoplastic radiation therapy: Secondary | ICD-10-CM | POA: Diagnosis not present

## 2018-07-17 ENCOUNTER — Ambulatory Visit
Admission: RE | Admit: 2018-07-17 | Discharge: 2018-07-17 | Disposition: A | Discharge status: Home / Self Care | Payer: Federal, State, Local not specified - PPO | Source: Ambulatory Visit | Attending: Radiation Oncology | Admitting: Radiation Oncology

## 2018-07-17 DIAGNOSIS — Z510 Encounter for antineoplastic radiation therapy: Secondary | ICD-10-CM | POA: Diagnosis not present

## 2018-07-17 DIAGNOSIS — C61 Malignant neoplasm of prostate: Secondary | ICD-10-CM | POA: Diagnosis not present

## 2018-07-18 ENCOUNTER — Ambulatory Visit
Admission: RE | Admit: 2018-07-18 | Discharge: 2018-07-18 | Disposition: A | Discharge status: Home / Self Care | Payer: Federal, State, Local not specified - PPO | Source: Ambulatory Visit | Attending: Radiation Oncology | Admitting: Radiation Oncology

## 2018-07-18 DIAGNOSIS — C61 Malignant neoplasm of prostate: Secondary | ICD-10-CM | POA: Diagnosis not present

## 2018-07-18 DIAGNOSIS — Z510 Encounter for antineoplastic radiation therapy: Secondary | ICD-10-CM | POA: Diagnosis not present

## 2018-07-19 ENCOUNTER — Encounter: Payer: Self-pay | Admitting: Medical Oncology

## 2018-07-19 ENCOUNTER — Ambulatory Visit
Admission: RE | Admit: 2018-07-19 | Discharge: 2018-07-19 | Disposition: A | Discharge status: Home / Self Care | Payer: Federal, State, Local not specified - PPO | Source: Ambulatory Visit | Attending: Radiation Oncology | Admitting: Radiation Oncology

## 2018-07-19 ENCOUNTER — Encounter: Payer: Self-pay | Admitting: Radiation Oncology

## 2018-07-19 ENCOUNTER — Ambulatory Visit: Payer: Federal, State, Local not specified - PPO

## 2018-07-19 DIAGNOSIS — Z510 Encounter for antineoplastic radiation therapy: Secondary | ICD-10-CM | POA: Diagnosis not present

## 2018-07-19 DIAGNOSIS — C61 Malignant neoplasm of prostate: Secondary | ICD-10-CM | POA: Diagnosis not present

## 2018-07-19 NOTE — Progress Notes (Signed)
  Radiation Oncology         [phone] ________________________________  Name: Edward [name] MRN: [id number]  Date: 07/19/2018  DOB: [birth year]  End of Treatment Note  Diagnosis:   67 y.o.gentleman with highrisk, Stage T3a N1adenocarcinoma of the prostate with positive nodes following prostatectomy andGleason Score of4+5with undetectable PSA post-prostatectomy.     Indication for treatment:  Curative,  Prostatic Fossa Radiotherapy       Radiation treatment dates:   05/28/2018 to 07/19/2018  Site/dose:   The pelvic nodes and prostate fossa were initially treated to 45 Gy in 25 fractions then a boost to the prostatic fossa was treated to 68.4 Gy in 38 fractions of 1.8 Gy  Beams/energy:   The prostatic fossa was treated using helical intensity modulated radiotherapy delivering 6 megavolt photons. Image guidance was performed with megavoltage CT studies prior to each fraction. He was immobilized with a body fix lower extremity mold.  Narrative: The patient tolerated radiation treatment relatively well.   He reports modest fatigue and mild urinary issues including urgency, leakage, and nocturia x5. Denies dysuria, hematuria, weak stream, hesitancy or straining. Reports using miralax to manage constipation.  Plan: The patient has completed radiation treatment. He will return to radiation oncology clinic for routine followup in one month. I advised him to call or return sooner if he has any questions or concerns related to his recovery or treatment. ________________________________  Sheral Apley. Tammi Klippel, M.D.  This document serves as a record of services personally performed by Tyler Pita, MD. It was created on his behalf by Arlyce Harman, a trained medical scribe. The creation of this record is based on the scribe's personal observations and the provider's statements to them. This document has been checked and approved by the attending provider.

## 2018-07-22 ENCOUNTER — Ambulatory Visit: Payer: Federal, State, Local not specified - PPO

## 2018-07-22 DIAGNOSIS — H3561 Retinal hemorrhage, right eye: Secondary | ICD-10-CM | POA: Diagnosis not present

## 2018-07-22 DIAGNOSIS — H4311 Vitreous hemorrhage, right eye: Secondary | ICD-10-CM | POA: Diagnosis not present

## 2018-07-22 DIAGNOSIS — H2511 Age-related nuclear cataract, right eye: Secondary | ICD-10-CM | POA: Diagnosis not present

## 2018-08-02 DIAGNOSIS — I2510 Atherosclerotic heart disease of native coronary artery without angina pectoris: Secondary | ICD-10-CM | POA: Diagnosis not present

## 2018-08-02 DIAGNOSIS — C61 Malignant neoplasm of prostate: Secondary | ICD-10-CM | POA: Diagnosis not present

## 2018-08-02 DIAGNOSIS — E7800 Pure hypercholesterolemia, unspecified: Secondary | ICD-10-CM | POA: Diagnosis not present

## 2018-08-02 DIAGNOSIS — I10 Essential (primary) hypertension: Secondary | ICD-10-CM | POA: Diagnosis not present

## 2018-08-05 DIAGNOSIS — H3561 Retinal hemorrhage, right eye: Secondary | ICD-10-CM | POA: Diagnosis not present

## 2018-08-05 DIAGNOSIS — H2511 Age-related nuclear cataract, right eye: Secondary | ICD-10-CM | POA: Diagnosis not present

## 2018-08-20 ENCOUNTER — Ambulatory Visit: Payer: Self-pay | Admitting: Urology

## 2018-08-28 ENCOUNTER — Ambulatory Visit
Admission: RE | Admit: 2018-08-28 | Discharge: 2018-08-28 | Disposition: A | Discharge status: Home / Self Care | Payer: Federal, State, Local not specified - PPO | Source: Ambulatory Visit | Attending: Radiation Oncology | Admitting: Radiation Oncology

## 2018-08-28 ENCOUNTER — Other Ambulatory Visit: Payer: Self-pay

## 2018-08-28 ENCOUNTER — Encounter: Payer: Self-pay | Admitting: Urology

## 2018-08-28 VITALS — BP 130/98 | HR 79 | Temp 98.2°F | Resp 20 | Ht 72.0 in | Wt 200.8 lb

## 2018-08-28 DIAGNOSIS — Z510 Encounter for antineoplastic radiation therapy: Secondary | ICD-10-CM | POA: Diagnosis not present

## 2018-08-28 DIAGNOSIS — C61 Malignant neoplasm of prostate: Secondary | ICD-10-CM | POA: Insufficient documentation

## 2018-08-28 NOTE — Progress Notes (Signed)
Pt here today for a follow-up for prostate cancer. Pt denies having any pain. Pt states that he has some fatigue and feels dehydrated. Pt states that he stays dehydrated. Pt denies having any dysuria or hematuria. Pt states urgency and is able to make it to the bathroom. Pt states that he will have leakage if he does not go to the bathroom. Pt denies having diarrhea.  BP (!) 130/98 (BP Location: Right Arm, Patient Position: Sitting)   Pulse 79   Temp 98.2 F (36.8 C) (Oral)   Resp 20   Ht 6' (1.829 m)   Wt 200 lb 12.8 oz (91.1 kg)   SpO2 100%   BMI 27.23 kg/m    Wt Readings from Last 3 Encounters:  08/28/18 200 lb 12.8 oz (91.1 kg)  05/06/18 196 lb 6.4 oz (89.1 kg)  04/03/18 201 lb (91.2 kg)

## 2018-08-28 NOTE — Progress Notes (Signed)
Radiation Oncology         [phone] ________________________________  Name: Edward [name] MRN: [id number]  Date: 08/28/2018  DOB: 04/04/1951  Post Treatment Note  CC: Darcus Austin, MD (Inactive)  Raynelle Bring, MD  Diagnosis:   68 y.o.gentleman with highrisk, Stage T3a N1adenocarcinoma of the prostate with positive nodes following prostatectomy andGleason Score of4+5with undetectable PSA post-prostatectomy.   Interval Since Last Radiation:  5.5 weeks  05/28/2018 to 07/19/2018: The pelvic nodes and prostate fossa were initially treated to 45 Gy in 25 fractions then a boost to the prostatic fossa was treated to 68.4 Gy in 38 fractions of 1.8 Gy  Narrative:  The patient returns today for routine follow-up.  He tolerated radiation treatment relatively well.   He reported modest fatigue and mild urinary issues including urgency, leakage, and nocturia x5 but denied dysuria, hematuria, weak stream, hesitancy or straining.  He experienced mild constipation which was managed with MiraLAX as needed.  He was maintained on ADT throughout his course of radiation and continued to tolerate this well.                        On review of systems, the patient states that he is doing very well overall.  He has had gradual improvement in his LUTS with a current IPSS score of 14 indicating mild to moderate urinary symptoms only.  He continues with increased urgency and nocturia x4 per night but specifically denies dysuria, gross hematuria, excessive daytime frequency, hesitancy, weak stream or feelings of incomplete emptying.  He denies incontinence.  He reports a healthy appetite and is maintaining his weight.  He denies abdominal pain, nausea, vomiting or diarrhea but continues with occasional constipation.  He uses MiraLAX 1-2 times per week to help manage the constipation.  Overall, he is quite pleased with his progress to date.  He continues with modest fatigue which is likely attributed to the  combination of radiation and ADT but he has been able to continue to work full-time and stay active.  ALLERGIES:  has No Known Allergies.  Meds: Current Outpatient Medications  Medication Sig Dispense Refill  . amLODipine (NORVASC) 10 MG tablet Take 10 mg by mouth daily.     Marland Kitchen aspirin EC 81 MG tablet Take 81 mg by mouth daily.    . cholecalciferol (VITAMIN D) 1000 units tablet Take 1,000 Units by mouth daily.    Marland Kitchen docusate sodium (COLACE) 100 MG capsule Take 100 mg by mouth daily as needed for mild constipation.    . ferrous sulfate 325 (65 FE) MG tablet Take 325 mg by mouth daily with breakfast.    . Multiple Vitamin (MULTIVITAMIN) tablet Take 1 tablet by mouth daily.    . nitroGLYCERIN (NITROSTAT) 0.4 MG SL tablet Place 0.4 mg under the tongue every 5 (five) minutes as needed for chest pain.    . Omega-3 Fatty Acids (FISH OIL) 1000 MG CAPS Take by mouth.    Marland Kitchen tiZANidine (ZANAFLEX) 2 MG tablet Take 2 mg by mouth every 8 (eight) hours as needed for muscle spasms.    . traMADol (ULTRAM) 50 MG tablet Take 1-2 tablets (50-100 mg total) by mouth every 6 (six) hours as needed for moderate pain or severe pain. 30 tablet 0  . atorvastatin (LIPITOR) 40 MG tablet Take 1 tablet (40 mg total) by mouth daily. 90 tablet 3   No current facility-administered medications for this encounter.     Physical Findings:  height is 6' (1.829 m) and weight is 200 lb 12.8 oz (91.1 kg). His oral temperature is 98.2 F (36.8 C). His blood pressure is 130/98 (abnormal) and his pulse is 79. His respiration is 20 and oxygen saturation is 100%.  Pain Assessment Pain Score: 0-No pain/10 In general this is a well appearing African-American male in no acute distress.  He's alert and oriented x4 and appropriate throughout the examination. Cardiopulmonary assessment is negative for acute distress and he exhibits normal effort.   Lab Findings: Lab Results  Component Value Date   WBC 4.7 02/22/2018   HGB 12.7 (L)  02/22/2018   HCT 36.4 (L) 02/22/2018   MCV 92.2 02/22/2018   PLT 241 02/22/2018     Radiographic Findings: No results found.  Impression/Plan: 1. 68 y.o.gentleman with highrisk, Stage T3a N1adenocarcinoma of the prostate with positive nodes following prostatectomy andGleason Score of4+5with undetectable PSA post-prostatectomy.   He will continue to follow up with urology for ongoing PSA determinations and has an appointment scheduled with Dr. Alinda Money on 10/11/18.  He continues to tolerate the ADT well and plans to further discuss the intended duration of this treatment with Dr. Alinda Money at his upcoming visit.  He understands what to expect with regards to PSA monitoring going forward. I will look forward to following his response to treatment via correspondence with urology, and would be happy to continue to participate in his care if clinically indicated. I talked to the patient about what to expect in the future, including his risk for erectile dysfunction and rectal bleeding. I encouraged him to call or return to the office if he has any questions regarding his previous radiation or possible radiation side effects. He was comfortable with this plan and will follow up as needed.    Nicholos Johns, PA-C

## 2018-10-04 DIAGNOSIS — C61 Malignant neoplasm of prostate: Secondary | ICD-10-CM | POA: Diagnosis not present

## 2018-10-11 DIAGNOSIS — R600 Localized edema: Secondary | ICD-10-CM | POA: Diagnosis not present

## 2018-10-11 DIAGNOSIS — C61 Malignant neoplasm of prostate: Secondary | ICD-10-CM | POA: Diagnosis not present

## 2018-10-14 ENCOUNTER — Other Ambulatory Visit: Payer: Self-pay | Admitting: Urology

## 2018-10-14 DIAGNOSIS — R600 Localized edema: Secondary | ICD-10-CM

## 2018-10-16 ENCOUNTER — Ambulatory Visit
Admission: RE | Admit: 2018-10-16 | Discharge: 2018-10-16 | Disposition: A | Discharge status: Home / Self Care | Payer: Federal, State, Local not specified - PPO | Source: Ambulatory Visit | Attending: Urology | Admitting: Urology

## 2018-10-16 DIAGNOSIS — R600 Localized edema: Secondary | ICD-10-CM

## 2018-10-17 ENCOUNTER — Telehealth: Payer: Self-pay

## 2018-10-17 NOTE — Telephone Encounter (Signed)
   Cardiac Questionnaire:    Since your last visit or hospitalization:    1. Have you been having new or worsening chest pain? No   2. Have you been having new or worsening shortness of breath? No 3. Have you been having new or worsening leg swelling, wt gain, or increase in abdominal girth (pants fitting more tightly)? No   4. Have you had any passing out spells? No    *A YES to any of these questions would result in the appointment being kept. *If all the answers to these questions are NO, we should indicate that given the current situation regarding the worldwide coronarvirus pandemic, at the recommendation of the CDC, we are looking to limit gatherings in our waiting area, and thus will reschedule their appointment beyond four weeks from today.   _____________   LOVFI-43 Pre-Screening Questions:  . Have you recently travelled abroad or to [place], [place] state, or [place]? No (4) . Do you currently have a fever? No (4) . Have you been in contact with someone that is currently pending confirmation of Covid19 testing or has been confirmed to have the [place] virus?  No (4) . Are you currently experiencing fatigue or cough? No (2) . Are you currently experiencing new or worsening shortness of breath at rest or with minimal activity? No (1) . Have you been in contact with someone that was recently sick with fever/cough/fatigue? No (1)   **A score of 4 or more should result in cancellation of the pts cardiology appt.  **A score of  2 should be provided a mask prior to admission into the lobby  *Travel to a high risk area or contact with a confirmed case should stay at home,  away from confirmed patient, monitor symptoms, and reach out to PCP for e-visit,  additional testing.  *ALL PTS W/ FEVER SHOULD BE REFERRED TO PCP FOR E-VISIT*     PT IS OKAY WITH BEING RESCHEDULED

## 2018-10-17 NOTE — Telephone Encounter (Signed)
Pt is scheduled to see Richardson Dopp PA-C on 12/04/2018  Rescheduled from 10/23/2018 due to [place]

## 2018-10-23 ENCOUNTER — Ambulatory Visit: Payer: Federal, State, Local not specified - PPO | Admitting: Cardiovascular Disease

## 2018-10-24 DIAGNOSIS — M4712 Other spondylosis with myelopathy, cervical region: Secondary | ICD-10-CM | POA: Diagnosis not present

## 2018-10-24 DIAGNOSIS — M25462 Effusion, left knee: Secondary | ICD-10-CM | POA: Diagnosis not present

## 2018-10-28 DIAGNOSIS — Z1211 Encounter for screening for malignant neoplasm of colon: Secondary | ICD-10-CM | POA: Diagnosis not present

## 2018-11-09 ENCOUNTER — Other Ambulatory Visit: Payer: Self-pay | Admitting: Cardiovascular Disease

## 2018-11-13 DIAGNOSIS — M79671 Pain in right foot: Secondary | ICD-10-CM | POA: Diagnosis not present

## 2018-11-27 ENCOUNTER — Telehealth: Payer: Self-pay | Admitting: *Deleted

## 2018-11-27 NOTE — Telephone Encounter (Signed)
SPOKE WITH PT AND PT CONSENTED TO IF USING DOXIMITY or DOXY.ME - The patient will receive a link just prior to their visit by text.  Confirm consent - "In the setting of the current Covid19 crisis, you are scheduled for a (phone or video) visit with your provider on (date) at (time).  Just as we do with many in-office visits, in order for you to participate in this visit, we must obtain consent.  If you'd like, I can send this to your mychart (if signed up) or email for you to review.  Otherwise, I can obtain your verbal consent now.  All virtual visits are billed to your insurance company just like a normal visit would be.  By agreeing to a virtual visit, we'd like you to understand that the technology does not allow for your provider to perform an examination, and thus may limit your provider's ability to fully assess your condition. If your provider identifies any concerns that need to be evaluated in person, we will make arrangements to do so.  Finally, though the technology is pretty good, we cannot assure that it will always work on either your or our end, and in the setting of a video visit, we may have to convert it to a phone-only visit.  In either situation, we cannot ensure that we have a secure connection.  Are you willing to proceed?" STAFF: Did the patient verbally acknowledge consent to telehealth visit? Document YES/NO here: YES    TELEPHONE CALL NOTE  Edward [name] has been deemed a candidate for a follow-up tele-health visit to limit community exposure during the Covid-19 pandemic. I spoke with the patient via phone to ensure availability of phone/video source, confirm preferred email & phone number, and discuss instructions and expectations.  I reminded Edward [name] to be prepared with any vital sign and/or heart rhythm information that could potentially be obtained via home monitoring, at the time of his visit. I reminded Edward [name] to expect a phone call prior to his  visit.  Claude Manges, [place] 11/27/2018 3:01 PM   FULL LENGTH CONSENT FOR TELE-HEALTH VISIT   I hereby voluntarily request, consent and authorize [facility] and its employed or contracted physicians, physician assistants, nurse practitioners or other licensed health care professionals (the Practitioner), to provide me with telemedicine health care services (the "Services") as deemed necessary by the treating Practitioner. I acknowledge and consent to receive the Services by the Practitioner via telemedicine. I understand that the telemedicine visit will involve communicating with the Practitioner through live audiovisual communication technology and the disclosure of certain medical information by electronic transmission. I acknowledge that I have been given the opportunity to request an in-person assessment or other available alternative prior to the telemedicine visit and am voluntarily participating in the telemedicine visit.  I understand that I have the right to withhold or withdraw my consent to the use of telemedicine in the course of my care at any time, without affecting my right to future care or treatment, and that the Practitioner or I may terminate the telemedicine visit at any time. I understand that I have the right to inspect all information obtained and/or recorded in the course of the telemedicine visit and may receive copies of available information for a reasonable fee.  I understand that some of the potential risks of receiving the Services via telemedicine include:  Marland Kitchen Delay or interruption in medical evaluation due to technological equipment failure or disruption; . Information transmitted may  not be sufficient (e.g. poor resolution of images) to allow for appropriate medical decision making by the Practitioner; and/or  . In rare instances, security protocols could fail, causing a breach of personal health information.  Furthermore, I acknowledge that it is my responsibility  to provide information about my medical history, conditions and care that is complete and accurate to the best of my ability. I acknowledge that Practitioner's advice, recommendations, and/or decision may be based on factors not within their control, such as incomplete or inaccurate data provided by me or distortions of diagnostic images or specimens that may result from electronic transmissions. I understand that the practice of medicine is not an exact science and that Practitioner makes no warranties or guarantees regarding treatment outcomes. I acknowledge that I will receive a copy of this consent concurrently upon execution via email to the email address I last provided but may also request a printed copy by calling the office of [place].    I understand that my insurance will be billed for this visit.   I have read or had this consent read to me. . I understand the contents of this consent, which adequately explains the benefits and risks of the Services being provided via telemedicine.  . I have been provided ample opportunity to ask questions regarding this consent and the Services and have had my questions answered to my satisfaction. . I give my informed consent for the services to be provided through the use of telemedicine in my medical care  By participating in this telemedicine visit I agree to the above.

## 2018-12-03 NOTE — Progress Notes (Signed)
Virtual Visit via Telephone Note   This visit type was conducted due to national recommendations for restrictions regarding the COVID-19 Pandemic (e.g. social distancing) in an effort to limit this patient's exposure and mitigate transmission in our community.  Due to his co-morbid illnesses, this patient is at least at moderate risk for complications without adequate follow up.  This format is felt to be most appropriate for this patient at this time.  The patient did not have access to video technology/had technical difficulties with video requiring transitioning to audio format only (telephone).  All issues noted in this document were discussed and addressed.  No physical exam could be performed with this format.  Please refer to the patient's chart for his  consent to telehealth for [facility].   Date:  12/04/2018   ID:  Edward [name], DOB [birth year], MRN [id number]  Patient Location: Home Provider Location: Home  PCP:  Maurice Small, MD  Cardiologist:  Mertie Moores, MD   Electrophysiologist:  None   Evaluation Performed:  Follow-Up Visit  Chief Complaint:  Follow up on CAD, HTN  History of Present Illness:    Edward [name] is a 68 y.o. male with coronary artery disease status post inferior MI in 1999 treated with a bare-metal stent to the distal RCA, hypertension, hyperlipidemia.  He was last seen in the clinic in September 2019 by Leanor Kail, PA-C.  He had been to the ED prior to this with bradycardia and hypotension. His Metoprolol and Lisinopril were discontinued.   Today, he notes he is doing well.  He just got off work.  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  He has not taken his medication yet today.  His BP typically runs 120s/70s.  He has not had chest pain, shortness of breath, syncope, lower extremity swelling.  The patient does not have symptoms concerning for COVID-19 infection (fever, chills, cough, or new shortness of breath).    Past Medical  History:  Diagnosis Date  . Chicken pox   . Coronary artery disease    a. 1999 s/p MI with cath/PCI;  b. 05/1999 Ex Cardiolite EF 68%, no ishcemia.  . Dyslipidemia   . Family history of breast cancer   . Family history of oral cancer   . History of tobacco abuse   . Hypertension   . Hypokalemia   . Injury of cervical spine ([place])    a. 02/2012 C4/5  . Measles   . Mumps   . Nasal bones, closed fracture    a. 02/2012 in setting of presyncope/fall  . Near syncope   . Pneumonia   . Prostate cancer ([place])   . Whooping cough    Past Surgical History:  Procedure Laterality Date  . [place]  . ANTERIOR CERVICAL DECOMP/DISCECTOMY FUSION  03/13/2012   Procedure: ANTERIOR CERVICAL DECOMPRESSION/DISCECTOMY FUSION 1 LEVEL;  Surgeon: Erline Levine, MD;  Location: [place] NEURO ORS;  Service: Neurosurgery;  Laterality: N/A;  Anterior Cervical Decompression/Fusion. Cervical four-five.  Marland Kitchen CARDIAC CATHETERIZATION  06/28/1998   single vessel CAD involving the distal RCA/PTCA and stenting of the distal RCA//EF- 50-55%  . CLOSED REDUCTION NASAL FRACTURE  03/13/2012   Procedure: CLOSED REDUCTION NASAL FRACTURE;  Surgeon: Theodoro Kos, DO;  Location: [place] NEURO ORS;  Service: Plastics;  Laterality: N/A;  Internal and external splinting of nasal fracture  . LYMPHADENECTOMY Bilateral 02/14/2018   Procedure: [facility];  Surgeon: Raynelle Bring, MD;  Location: [facility] ORS;  Service: Urology;  Laterality: Bilateral;  . NM MYOVIEW LTD  05/17/2009   normal stress nuclear study/no evidence of ischemia/EF- 68%  . [place] tendon surgery  1989  . ROBOT ASSISTED LAPAROSCOPIC RADICAL PROSTATECTOMY N/A 02/14/2018   Procedure: XI ROBOTIC ASSISTED LAPAROSCOPIC RADICAL PROSTATECTOMY;  Surgeon: Raynelle Bring, MD;  Location: [facility] ORS;  Service: Urology;  Laterality: N/A;     Current Meds  Medication Sig  . amLODipine (NORVASC) 10 MG tablet Take 10 mg by mouth daily.   Marland Kitchen aspirin EC 81 MG tablet Take 81  mg by mouth daily.  Marland Kitchen atorvastatin (LIPITOR) 40 MG tablet TAKE 1 TABLET(40 MG) BY MOUTH DAILY  . cholecalciferol (VITAMIN D) 1000 units tablet Take 1,000 Units by mouth daily.  Marland Kitchen docusate sodium (COLACE) 100 MG capsule Take 100 mg by mouth daily as needed for mild constipation.  . ferrous sulfate 325 (65 FE) MG tablet Take 325 mg by mouth daily with breakfast.  . furosemide (LASIX) 40 MG tablet TK 1 T PO 2 TIMES WEEKLY PRF SWELLING  . meloxicam (MOBIC) 15 MG tablet TK 1 T PO QD  . Multiple Vitamin (MULTIVITAMIN) tablet Take 1 tablet by mouth daily.  . nitroGLYCERIN (NITROSTAT) 0.4 MG SL tablet Place 0.4 mg under the tongue every 5 (five) minutes as needed for chest pain.  . Omega-3 Fatty Acids (FISH OIL) 1000 MG CAPS Take 1 capsule by mouth daily.   . vitamin B-12 (CYANOCOBALAMIN) 1000 MCG tablet Take 1,000 mcg by mouth daily.  . [DISCONTINUED] vitamin B-12 (CYANOCOBALAMIN) 500 MCG tablet Take 500 mcg by mouth daily.     Allergies:   Patient has no known allergies.   Social History   Tobacco Use  . Smoking status: Former Smoker    Packs/day: 1.00    Years: 20.00    Pack years: 20.00    Types: Cigarettes    Last attempt to quit: 11/01/1988    Years since quitting: 30.1  . Smokeless tobacco: Never Used  Substance Use Topics  . Alcohol use: Yes    Comment: occasional alcoholic beverage.  . Drug use: No     Family Hx: The patient's family history includes Breast cancer in his daughter and mother; Cancer in his sister; Heart attack in his father.  ROS:   Please see the history of present illness.    All other systems reviewed and are negative.   Prior CV studies:   The following studies were reviewed today:  Myoview 05/18/2009 EF 68, no ischemia, normal study  Cardiac catheterization 06/28/1998 LM normal LAD normal LCx irregularities RCA distal 80-85 EF 50-55 PCI: 3 x 16 mm BMS to the distal RCA  Labs/Other Tests and Data Reviewed:    EKG:  No ECG reviewed.  Recent  Labs: 02/22/2018: Hemoglobin 12.7; Platelets 241 03/29/2018: ALT 22; BUN 10; Creatinine, Ser 0.99; Potassium 4.0; Sodium 145   Recent Lipid Panel Lab Results  Component Value Date/Time   CHOL 158 03/29/2018 09:50 AM   TRIG 185 (H) 03/29/2018 09:50 AM   HDL 44 03/29/2018 09:50 AM   CHOLHDL 3.6 03/29/2018 09:50 AM   CHOLHDL 6 07/01/2014 09:18 AM   LDLCALC 77 03/29/2018 09:50 AM   LDLDIRECT 81.6 07/01/2014 09:18 AM     Wt Readings from Last 3 Encounters:  12/04/18 204 lb (92.5 kg)  08/28/18 200 lb 12.8 oz (91.1 kg)  05/06/18 196 lb 6.4 oz (89.1 kg)     Objective:    Vital Signs:  BP (!) 151/83   Pulse 76  Ht 6' (1.829 m)   Wt 204 lb (92.5 kg)   BMI 27.67 kg/m    VITAL SIGNS:  reviewed GEN:  no acute distress RESPIRATORY:  No labored breathing during our conservation NEURO:  Alert and Oriented PSYCH:  He seems to be in good spirits  ASSESSMENT & PLAN:    Coronary artery disease involving native coronary artery of native heart without angina pectoris History of remote MI in 1999 treated with a bare-metal stent to the RCA.  He has not had any symptoms of angina.  Continue aspirin, statin.  Essential hypertension The patient's blood pressure is controlled on his current regimen.  Continue current therapy.   Mixed hyperlipidemia Recent LDL above goal.  Continue to work on diet.  We will need to plan on repeat lipids at some point in the next 6 to 12 months.  If his LDL remains >70, consider increasing atorvastatin to 80 mg versus changing to rosuvastatin 20 mg.  COVID-19 Education: The signs and symptoms of COVID-19 were discussed with the patient and how to seek care for testing (follow up with PCP or arrange E-visit).  The importance of social distancing was discussed today.  Time:   Today, I have spent 15 minutes with the patient with telehealth technology discussing the above problems.     Medication Adjustments/Labs and Tests Ordered: Current medicines are  reviewed at length with the patient today.  Concerns regarding medicines are outlined above.   Tests Ordered: No orders of the defined types were placed in this encounter.   Medication Changes: No orders of the defined types were placed in this encounter.   Disposition:  Follow up in 6 month(s)  Signed, Richardson Dopp, PA-C  12/04/2018 8:47 AM    [facility]

## 2018-12-04 ENCOUNTER — Other Ambulatory Visit: Payer: Self-pay

## 2018-12-04 ENCOUNTER — Telehealth (INDEPENDENT_AMBULATORY_CARE_PROVIDER_SITE_OTHER): Payer: Federal, State, Local not specified - PPO | Admitting: Physician Assistant

## 2018-12-04 ENCOUNTER — Encounter: Payer: Self-pay | Admitting: Physician Assistant

## 2018-12-04 VITALS — BP 151/83 | HR 76 | Ht 72.0 in | Wt 204.0 lb

## 2018-12-04 DIAGNOSIS — I10 Essential (primary) hypertension: Secondary | ICD-10-CM

## 2018-12-04 DIAGNOSIS — I2510 Atherosclerotic heart disease of native coronary artery without angina pectoris: Secondary | ICD-10-CM | POA: Diagnosis not present

## 2018-12-04 DIAGNOSIS — Z7189 Other specified counseling: Secondary | ICD-10-CM

## 2018-12-04 DIAGNOSIS — E782 Mixed hyperlipidemia: Secondary | ICD-10-CM

## 2018-12-04 NOTE — Patient Instructions (Signed)
Medication Instructions:  No changes.  Continue current medications.  If you need a refill on your cardiac medications before your next appointment, please call your pharmacy.   Lab work: None  If you have labs (blood work) drawn today and your tests are completely normal, you will receive your results only by: Marland Kitchen MyChart Message (if you have MyChart) OR . A paper copy in the mail If you have any lab test that is abnormal or we need to change your treatment, we will call you to review the results.  Testing/Procedures: None  Follow-Up: At [facility], you and your health needs are our priority.  As part of our continuing mission to provide you with exceptional heart care, we have created designated Provider Care Teams.  These Care Teams include your primary Cardiologist (physician) and Advanced Practice Providers (APPs -  Physician Assistants and Nurse Practitioners) who all work together to provide you with the care you need, when you need it. You will need a follow up appointment in:  6 months.  Please call our office 2 months in advance to schedule this appointment.  You may see Mertie Moores, MD or Richardson Dopp, PA-C   Any Other Special Instructions Will Be Listed Below (If Applicable).  Continue to monitor your blood pressure.  If you notice that your blood pressure is typically above 130/80, call us. Continue to work on diet for cholesterol.  This will need to be rechecked at some point in the next 6 to 12 months to determine if medication changes are needed.  If your primary care doctor checks your cholesterol, have her send [place] a copy of the report.

## 2019-02-25 DIAGNOSIS — Z86010 Personal history of colonic polyps: Secondary | ICD-10-CM | POA: Diagnosis not present

## 2019-03-14 DIAGNOSIS — S0181XA Laceration without foreign body of other part of head, initial encounter: Secondary | ICD-10-CM | POA: Diagnosis not present

## 2019-04-04 DIAGNOSIS — E7800 Pure hypercholesterolemia, unspecified: Secondary | ICD-10-CM | POA: Diagnosis not present

## 2019-04-04 DIAGNOSIS — I10 Essential (primary) hypertension: Secondary | ICD-10-CM | POA: Diagnosis not present

## 2019-04-09 IMAGING — US VENOUS DOPPLER ULTRASOUND OF  LOWER EXTREMITIES
1 series · 13 of 24 positions shown · non-contrast
Comparison: None.

CLINICAL DATA: Bilateral lower extremity edema.  Evaluate for DVT.



[Series 1: venous doppler ultrasound of lower extremities · 0.08mm/px · 13 of 49 slices shown]
[im 1/49]
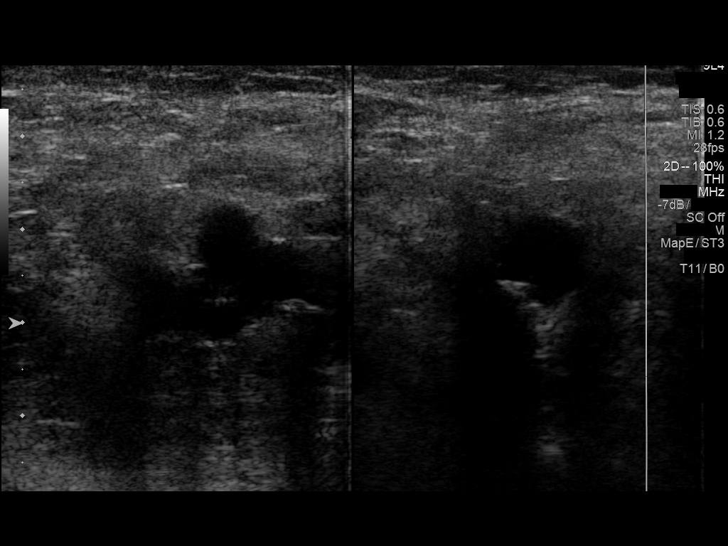
[im 5/49]
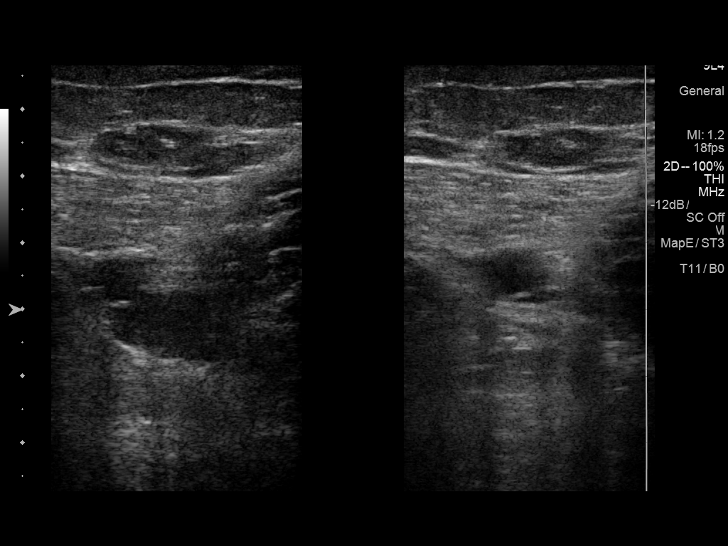
[im 9/49]
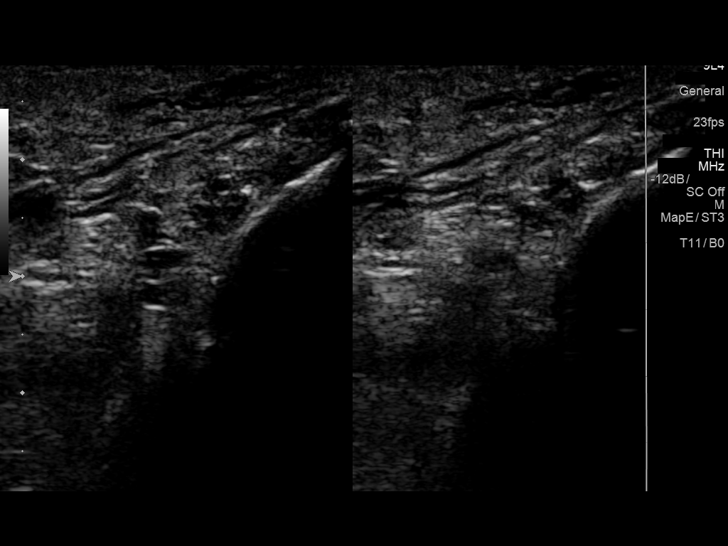
[im 13/49]
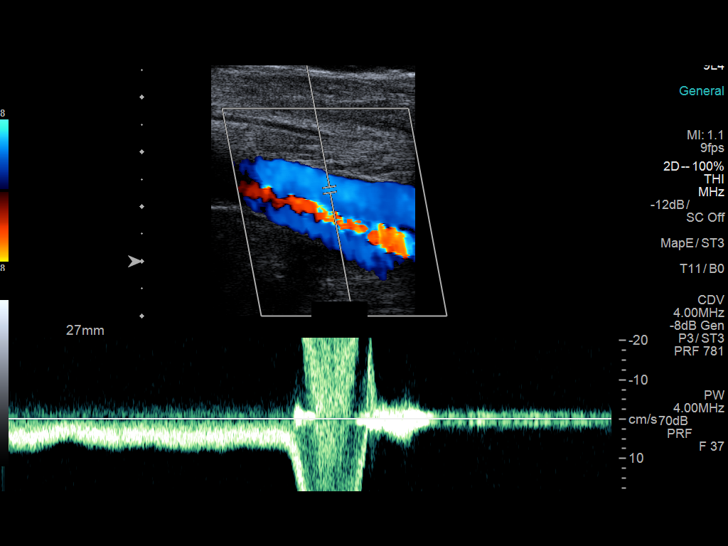
[im 17/49]
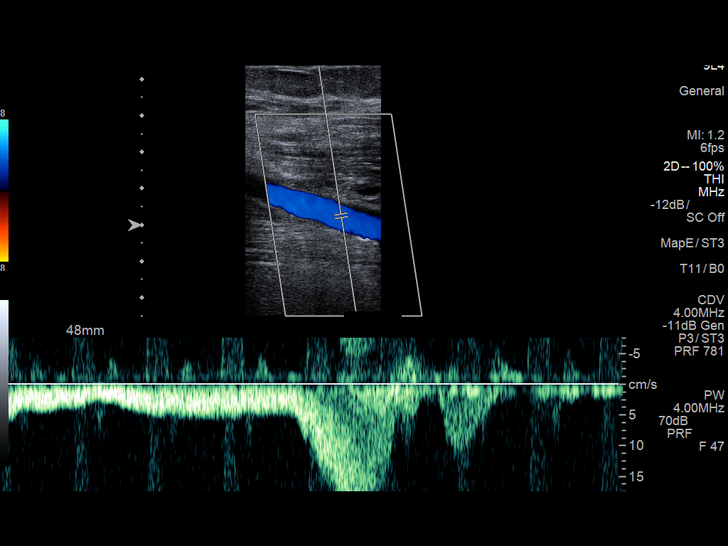
[im 21/49]
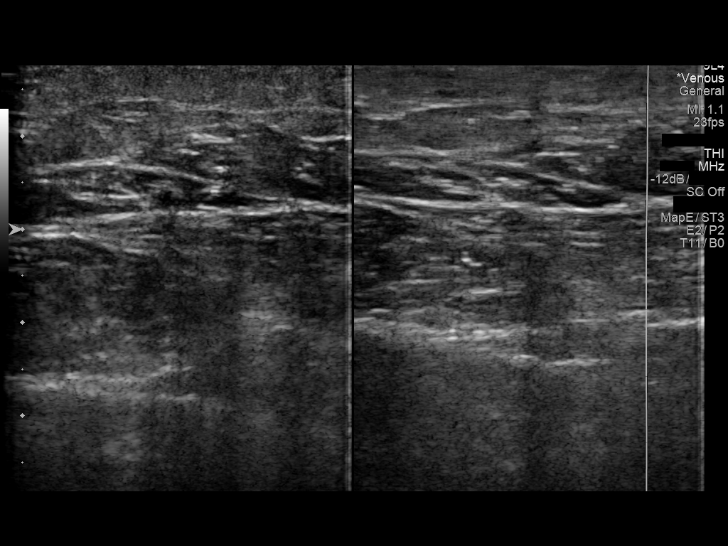
[im 26/49]
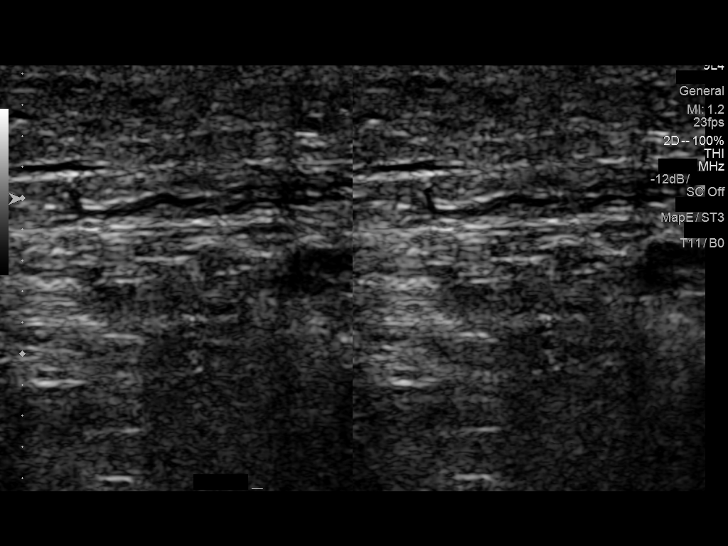
[im 28/49]
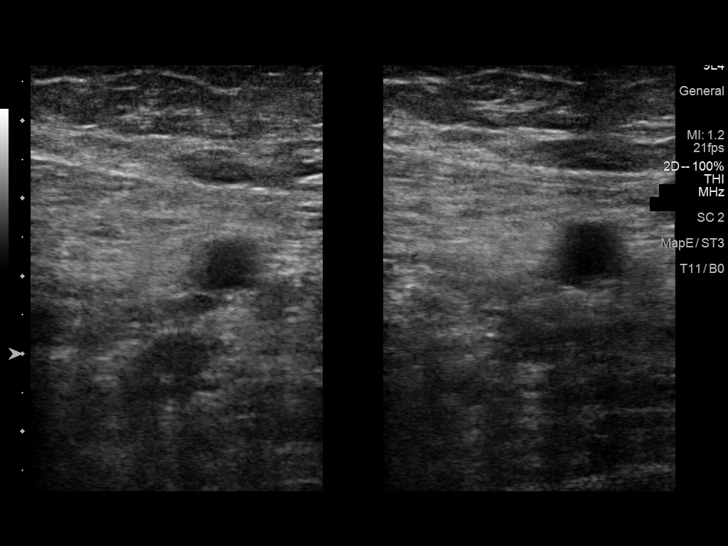
[im 32/49]
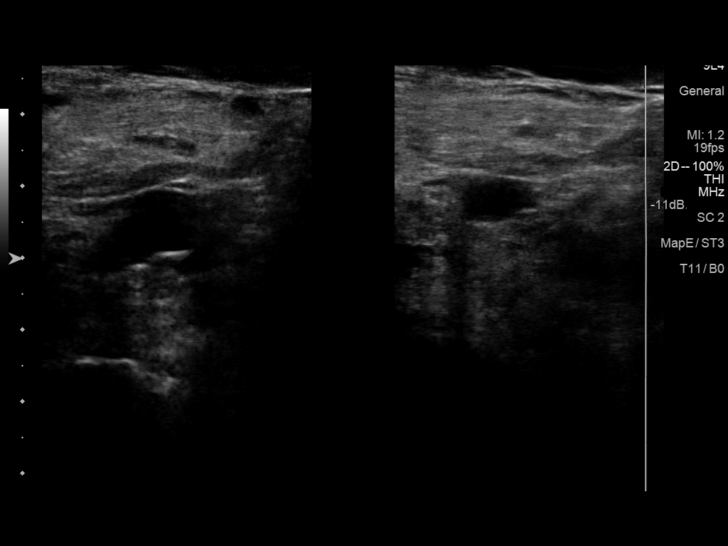
[im 36/49]
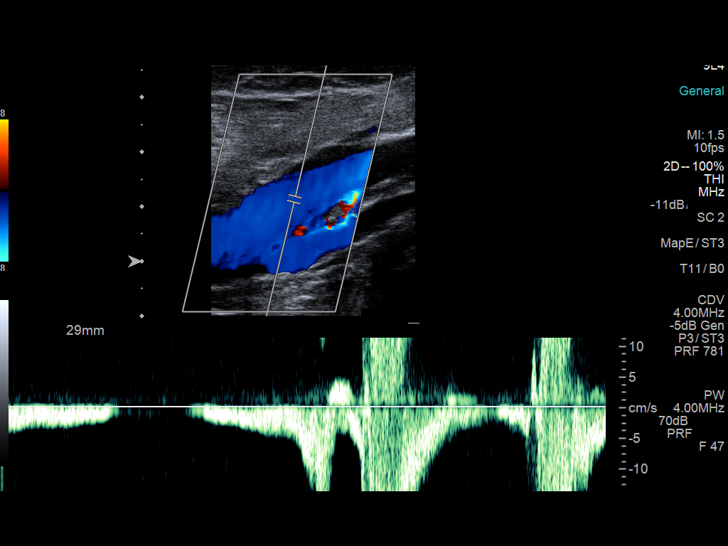
[im 40/49]
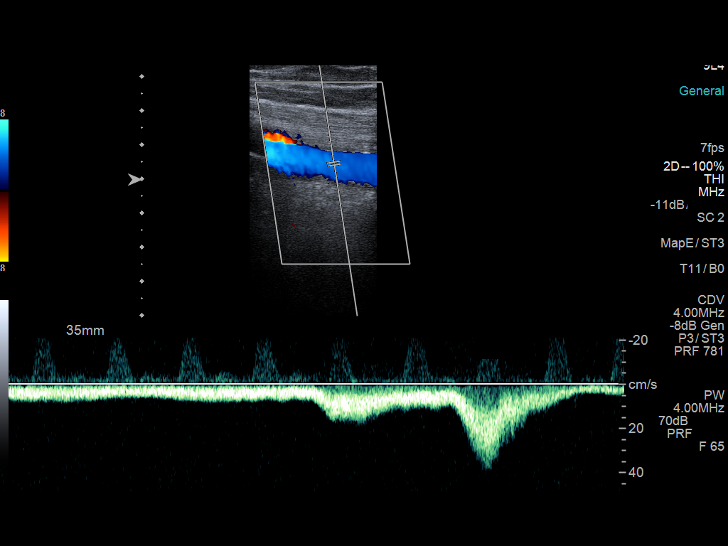
[im 44/49]
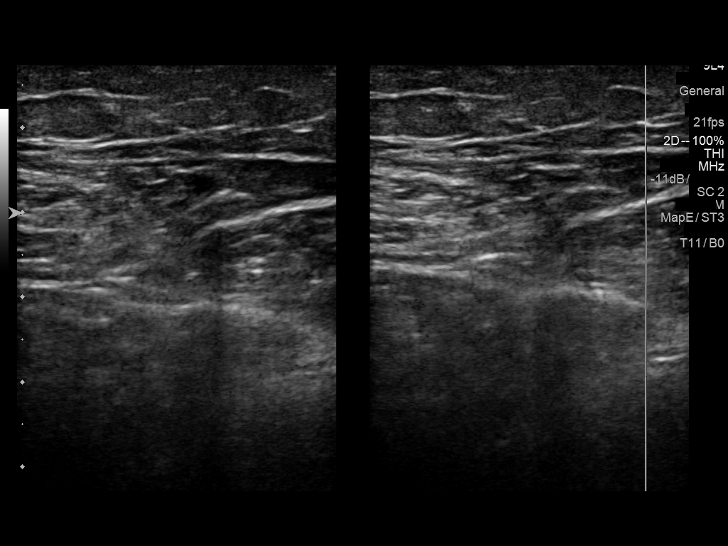
[im 49/49]
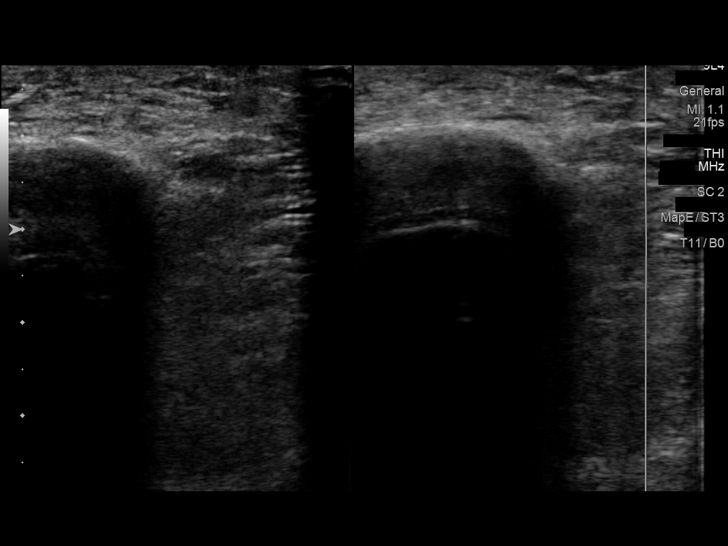

[13 of 24 positions shown; findings below may reference images not displayed]

FINDINGS: RIGHT LOWER EXTREMITY

Common Femoral Vein: No evidence of thrombus. Normal
compressibility, respiratory phasicity and response to augmentation.

Saphenofemoral Junction: No evidence of thrombus. Normal
compressibility and flow on color Doppler imaging.

Profunda Femoral Vein: No evidence of thrombus. Normal
compressibility and flow on color Doppler imaging.

Femoral Vein: No evidence of thrombus. Normal compressibility,
respiratory phasicity and response to augmentation.

Popliteal Vein: No evidence of thrombus. Normal compressibility,
respiratory phasicity and response to augmentation.

Calf Veins: No evidence of thrombus. Normal compressibility and flow
on color Doppler imaging.

Superficial Great Saphenous Vein: No evidence of thrombus. Normal
compressibility.

Venous Reflux:  None.

Other Findings:  None.

LEFT LOWER EXTREMITY

Common Femoral Vein: No evidence of thrombus. Normal
compressibility, respiratory phasicity and response to augmentation.

Saphenofemoral Junction: No evidence of thrombus. Normal
compressibility and flow on color Doppler imaging.

Profunda Femoral Vein: No evidence of thrombus. Normal
compressibility and flow on color Doppler imaging.

Femoral Vein: No evidence of thrombus. Normal compressibility,
respiratory phasicity and response to augmentation.

Popliteal Vein: No evidence of thrombus. Normal compressibility,
respiratory phasicity and response to augmentation.

Calf Veins: No evidence of thrombus. Normal compressibility and flow
on color Doppler imaging.

Superficial Great Saphenous Vein: No evidence of thrombus. Normal
compressibility.

Venous Reflux:  None.

Other Findings:  None.
IMPRESSION: No evidence of DVT within either lower extremity.

## 2019-04-10 DIAGNOSIS — Z86010 Personal history of colonic polyps: Secondary | ICD-10-CM | POA: Diagnosis not present

## 2019-05-13 DIAGNOSIS — R310 Gross hematuria: Secondary | ICD-10-CM | POA: Diagnosis not present

## 2019-05-21 DIAGNOSIS — C61 Malignant neoplasm of prostate: Secondary | ICD-10-CM | POA: Diagnosis not present

## 2019-05-28 DIAGNOSIS — R310 Gross hematuria: Secondary | ICD-10-CM | POA: Diagnosis not present

## 2019-05-28 DIAGNOSIS — N5201 Erectile dysfunction due to arterial insufficiency: Secondary | ICD-10-CM | POA: Diagnosis not present

## 2019-05-28 DIAGNOSIS — C61 Malignant neoplasm of prostate: Secondary | ICD-10-CM | POA: Diagnosis not present

## 2019-06-12 DIAGNOSIS — M1712 Unilateral primary osteoarthritis, left knee: Secondary | ICD-10-CM | POA: Diagnosis not present

## 2019-06-12 DIAGNOSIS — M1711 Unilateral primary osteoarthritis, right knee: Secondary | ICD-10-CM | POA: Diagnosis not present

## 2019-06-12 DIAGNOSIS — M19011 Primary osteoarthritis, right shoulder: Secondary | ICD-10-CM | POA: Diagnosis not present

## 2019-06-12 DIAGNOSIS — M545 Low back pain: Secondary | ICD-10-CM | POA: Diagnosis not present

## 2019-07-08 ENCOUNTER — Other Ambulatory Visit: Payer: Self-pay

## 2019-07-08 ENCOUNTER — Ambulatory Visit: Payer: Federal, State, Local not specified - PPO | Admitting: Cardiovascular Disease

## 2019-07-08 ENCOUNTER — Encounter (INDEPENDENT_AMBULATORY_CARE_PROVIDER_SITE_OTHER): Payer: Self-pay

## 2019-07-08 ENCOUNTER — Encounter: Payer: Self-pay | Admitting: Cardiovascular Disease

## 2019-07-08 VITALS — BP 140/78 | HR 63 | Ht 72.0 in | Wt 213.0 lb

## 2019-07-08 DIAGNOSIS — I2510 Atherosclerotic heart disease of native coronary artery without angina pectoris: Secondary | ICD-10-CM

## 2019-07-08 DIAGNOSIS — R600 Localized edema: Secondary | ICD-10-CM | POA: Diagnosis not present

## 2019-07-08 DIAGNOSIS — I252 Old myocardial infarction: Secondary | ICD-10-CM | POA: Diagnosis not present

## 2019-07-08 DIAGNOSIS — M1711 Unilateral primary osteoarthritis, right knee: Secondary | ICD-10-CM | POA: Diagnosis not present

## 2019-07-08 DIAGNOSIS — E785 Hyperlipidemia, unspecified: Secondary | ICD-10-CM

## 2019-07-08 MED ORDER — NITROGLYCERIN 0.4 MG SL SUBL: 0.4000 mg | SUBLINGUAL_TABLET | SUBLINGUAL | 6 refills | Status: DC | PRN

## 2019-07-08 NOTE — Patient Instructions (Signed)
Medication Instructions:  Your physician recommends that you continue on your current medications as directed. Please refer to the Current Medication list given to you today.  A Refill of your Nitroglycerin has been sent to your pharmacy *If you need a refill on your cardiac medications before your next appointment, please call your pharmacy*  Lab Work: Your physician recommends that you return for lab work in: 12 months on the day of or a few days before your office visit with Dr. Acie Fredrickson.  You will need to FAST for this appointment - nothing to eat or drink after midnight the night before except water.    Testing/Procedures: Your physician has requested that you have an echocardiogram. Echocardiography is a painless test that uses sound waves to create images of your heart. It provides your doctor with information about the size and shape of your heart and how well your heart's chambers and valves are working. This procedure takes approximately one hour. There are no restrictions for this procedure.    Follow-Up: At [facility], you and your health needs are our priority.  As part of our continuing mission to provide you with exceptional heart care, we have created designated Provider Care Teams.  These Care Teams include your primary Cardiologist (physician) and Advanced Practice Providers (APPs -  Physician Assistants and Nurse Practitioners) who all work together to provide you with the care you need, when you need it.  Your next appointment:   1 year(s)  The format for your next appointment:   In Person  Provider:   You may see Mertie Moores, MD or one of the following Advanced Practice Providers on your designated Care Team:    Richardson Dopp, PA-C  [place], [place]  Daune Perch, [place]

## 2019-07-08 NOTE — Progress Notes (Signed)
Edward [name] Date of Birth  Sep 13, 1950       [place] [address], [place], [place]  [address]          Problem List: Coronary artery disease-status post inferior wall myocardial infarction-status post PTCA and stenting of his right coronary artery - 1999 2. Dyslipidemia 3. Hypertension 4. Hypokalemia 5. fall with subsequent neck fracture   November 18, 2012: Edward [name] is doing fairly well from a cardiac standpoint. He has not had any episodes of chest pain or shortness breath. He is still recovering from his neck fracture in August of 2013. He still has some residual hand tingling and numbness.  He also has some numbness in his feet.  He also has developed a peripheral neuropathy.  Jun 12, 2013:  Edward [name] is doing OK.  He has been found to have a ruptured disc that affected his gait - this ultimately is what caused his fall last year.  No further burining in feet.  Still rehabing  . He is back at work. No CP, no cardiac   Dec 15, 2013:  Edward [name] is doing well.   He has cut out most of his salt.   BP is much better.   No CP.  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  Not as much exercise recently.   Dec. 2, 2015:  Edward [name] is doing well.  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  Brier had a mild chest pain - did not call or take NTG.  May have been indigestion.   He has improved his diet.  He needs fasting labs today He has requested a flu shot.  Dec. 6, 2017:  Doing well . Doing stationary bike  Labs from Dr. Inda Merlin look great   Feb. 28, 2019:  Edward [name] is seen today for follow up of his CAD .  Doing well  Talked about his daughter, Edward [name] who works as a Marine scientist  No CP or dyspnea ,  Is not as active as he needs to be  BP is elevated.   Did not take his meds yet today  Has gained some weight    July 08, 2019:  Edward [name] is seen today for follow-up of his coronary artery disease. Having some issues with his  right leg , knee pain Has been diagnosed with prostate cancer. Has prostate surgery  PSA is now 0  Has some leg edema .  ( in on Amlodipine 10 mg a day )   Will check an echo .    Current Outpatient Medications on File Prior to Visit  Medication Sig Dispense Refill  . amLODipine (NORVASC) 10 MG tablet Take 10 mg by mouth daily.     Marland Kitchen aspirin EC 81 MG tablet Take 81 mg by mouth daily.    Marland Kitchen atorvastatin (LIPITOR) 40 MG tablet TAKE 1 TABLET(40 MG) BY MOUTH DAILY 90 tablet 1  . cholecalciferol (VITAMIN D) 1000 units tablet Take 1,000 Units by mouth daily.    Marland Kitchen docusate sodium (COLACE) 100 MG capsule Take 100 mg by mouth daily as needed for mild constipation.    . ferrous sulfate 325 (65 FE) MG tablet Take 325 mg by mouth daily with breakfast.    . furosemide (LASIX) 40 MG tablet TK 1 T PO 2 TIMES WEEKLY PRF SWELLING    . meloxicam (MOBIC) 15 MG tablet TK 1 T PO  QD    . Multiple Vitamin (MULTIVITAMIN) tablet Take 1 tablet by mouth daily.    . Omega-3 Fatty Acids (FISH OIL) 1000 MG CAPS Take 1 capsule by mouth daily.     . vitamin B-12 (CYANOCOBALAMIN) 1000 MCG tablet Take 1,000 mcg by mouth daily.     No current facility-administered medications on file prior to visit.     No Known Allergies  Past Medical History:  Diagnosis Date  . Chicken pox   . Coronary artery disease    a. 1999 s/p MI with cath/PCI;  b. 05/1999 Ex Cardiolite EF 68%, no ishcemia.  . Dyslipidemia   . Family history of breast cancer   . Family history of oral cancer   . History of tobacco abuse   . Hypertension   . Hypokalemia   . Injury of cervical spine ([place])    a. 02/2012 C4/5  . Measles   . Mumps   . Nasal bones, closed fracture    a. 02/2012 in setting of presyncope/fall  . Near syncope   . Pneumonia   . Prostate cancer ([place])   . Whooping cough     Past Surgical History:  Procedure Laterality Date  . [employer]  . ANTERIOR CERVICAL DECOMP/DISCECTOMY FUSION  03/13/2012    Procedure: ANTERIOR CERVICAL DECOMPRESSION/DISCECTOMY FUSION 1 LEVEL;  Surgeon: Erline Levine, MD;  Location: [facility] ORS;  Service: Neurosurgery;  Laterality: N/A;  Anterior Cervical Decompression/Fusion. Cervical four-five.  Marland Kitchen CARDIAC CATHETERIZATION  06/28/1998   single vessel CAD involving the distal RCA/PTCA and stenting of the distal RCA//EF- 50-55%  . CLOSED REDUCTION NASAL FRACTURE  03/13/2012   Procedure: CLOSED REDUCTION NASAL FRACTURE;  Surgeon: Theodoro Kos, DO;  Location: [facility] NEURO ORS;  Service: Plastics;  Laterality: N/A;  Internal and external splinting of nasal fracture  . LYMPHADENECTOMY Bilateral 02/14/2018   Procedure: [facility];  Surgeon: Raynelle Bring, MD;  Location: [facility];  Service: Urology;  Laterality: Bilateral;  . NM MYOVIEW LTD  05/17/2009   normal stress nuclear study/no evidence of ischemia/EF- 68%  . petalla tendon surgery  1989  . ROBOT ASSISTED LAPAROSCOPIC RADICAL PROSTATECTOMY N/A 02/14/2018   Procedure: XI ROBOTIC ASSISTED LAPAROSCOPIC RADICAL PROSTATECTOMY;  Surgeon: Raynelle Bring, MD;  Location: [facility] ORS;  Service: Urology;  Laterality: N/A;    Social History   Tobacco Use  Smoking Status Former Smoker  . Packs/day: 1.00  . Years: 20.00  . Pack years: 20.00  . Types: Cigarettes  . Quit date: 11/01/1988  . Years since quitting: 30.7  Smokeless Tobacco Never Used    Social History   Substance and Sexual Activity  Alcohol Use Yes   Comment: occasional alcoholic beverage.    Family History  Problem Relation Age of Onset  . Heart attack Father        77  . Breast cancer Mother        dx late 37s, d. 61  . Cancer Sister        oral cancer dx 13  . Breast cancer Daughter        dx. 52, currently being treated    Reviw of Systems:  Reviewed in the HPI.  All other systems are negative.  Physical Exam: Blood pressure 140/78, pulse 63, height 6' (1.829 m), weight 213 lb (96.6 kg).  GEN:  Well nourished, well developed in no acute  distress HEENT: Normal NECK: No JVD; No carotid bruits LYMPHATICS: No lymphadenopathy CARDIAC: RRR  RESPIRATORY:  Clear to  auscultation without rales, wheezing or rhonchi  ABDOMEN: Soft, non-tender, non-distended MUSCULOSKELETAL:   Trace leg edema  SKIN: Warm and dry NEUROLOGIC:  Alert and oriented x 3   ECG: July 08, 2019: Normal sinus rhythm at 63 beats a minute.  No acute changes.  Left axis deviation.    Assessment / Plan:   1. CAD:   Doing well s/p PCI in 1999.  No recent episodes of angina.  Continue current medications.  2.  History of leg edema: He has had some leg edema recently.  He is on amlodipine which might be contributing to his leg edema.  He has a history of coronary artery disease.  We will get an echocardiogram to further evaluate his left ventricular function.  Continue to take Lasix on an as-needed basis.  . hyperliperdemia - continue atorvastatin.  His last lipid levels look good.  Triglyceride levels were slightly  elevated.  Encouraged him to watch his intake of dietary fats.   3.  HTN:    Cont meds.    Will see him in 1 year with lipid levels, liver enzymes, basic metabolic profile.    Mertie Moores, MD  07/08/2019 9:05 AM    [address] [address],  [place], [place]  [address] Pager [phone] Phone: [phone]; Fax: [phone]

## 2019-07-09 ENCOUNTER — Other Ambulatory Visit: Payer: Self-pay | Admitting: Cardiovascular Disease

## 2019-07-09 MED ORDER — ATORVASTATIN CALCIUM 40 MG PO TABS: ORAL_TABLET | ORAL | 3 refills | Status: DC

## 2019-07-18 ENCOUNTER — Ambulatory Visit (HOSPITAL_COMMUNITY): Payer: Federal, State, Local not specified - PPO | Attending: Cardiology

## 2019-07-18 ENCOUNTER — Other Ambulatory Visit: Payer: Self-pay

## 2019-07-18 DIAGNOSIS — R600 Localized edema: Secondary | ICD-10-CM

## 2019-07-18 DIAGNOSIS — I252 Old myocardial infarction: Secondary | ICD-10-CM

## 2019-08-07 DIAGNOSIS — M4722 Other spondylosis with radiculopathy, cervical region: Secondary | ICD-10-CM | POA: Diagnosis not present

## 2019-08-07 DIAGNOSIS — S43402A Unspecified sprain of left shoulder joint, initial encounter: Secondary | ICD-10-CM | POA: Diagnosis not present

## 2019-08-07 DIAGNOSIS — M25561 Pain in right knee: Secondary | ICD-10-CM | POA: Diagnosis not present

## 2019-08-07 DIAGNOSIS — M542 Cervicalgia: Secondary | ICD-10-CM | POA: Diagnosis not present

## 2019-08-16 DIAGNOSIS — M25561 Pain in right knee: Secondary | ICD-10-CM | POA: Diagnosis not present

## 2019-08-21 DIAGNOSIS — N5201 Erectile dysfunction due to arterial insufficiency: Secondary | ICD-10-CM | POA: Diagnosis not present

## 2019-08-27 ENCOUNTER — Telehealth: Payer: Self-pay | Admitting: *Deleted

## 2019-08-27 ENCOUNTER — Other Ambulatory Visit: Payer: Self-pay | Admitting: Orthopedic Surgery

## 2019-08-27 NOTE — Telephone Encounter (Signed)
Left Voicemail to call back

## 2019-08-27 NOTE — Telephone Encounter (Signed)
   [facility] Pre-operative Risk Assessment    Request for surgical clearance:  1. What type of surgery is being performed? RIGHT TOTAL KNEE ARTHROPLASTY   2. When is this surgery scheduled? 09/26/19   3. What type of clearance is required (medical clearance vs. Pharmacy clearance to hold med vs. Both)? MEDICAL  4. Are there any medications that need to be held prior to surgery and how long? ASA   5. Practice name and name of physician performing surgery? [facility]; DR. Jenny Reichmann LEE GRAVES   6. What is your office phone number [phone] ATTN: STEPHANIE GRIFFIN    7.   What is your office fax number [phone]  8.   Anesthesia type (None, local, MAC, general) ? SPINAL   Julaine Hua 08/27/2019, 3:20 PM  _________________________________________________________________   (provider comments below)

## 2019-08-29 NOTE — Telephone Encounter (Addendum)
   Primary Cardiologist: Mertie Moores, MD  Chart reviewed as part of pre-operative protocol coverage. Patient was last seen by Dr. Acie Fredrickson on 07/08/2019 at which time he was having some lower extremity edema but was otherwise doing well from a cardiac standpoint. An Echo was ordered and showed normal LV systolic function, normal wall motion, and grade 1 diastolic dysfunction. Edema suspected to be due to Amlodipine. I called and spoke with patient today and he reports no changes since last visit.   Per Dr. Acie Fredrickson: "Armstrong is at low risk for his surgery. He may hold his ASA for 5-7 days prior to surgery."  I will route this recommendation to the requesting party via [facility] fax function and remove from pre-op pool.  Please call with questions.  Darreld Mclean, PA-C 08/29/2019, 3:28 PM

## 2019-08-29 NOTE — Telephone Encounter (Signed)
Edward [name] is at low risk for his surgery . He may hold his ASA for 5-7 days prior to surgery

## 2019-08-29 NOTE — Telephone Encounter (Signed)
Hi Dr. Acie Fredrickson,   Patient has upcoming right total knee arthroplasty on 09/26/2019. Can you comment on how long it is OK for him to hold Aspirin? He has a history of CAD s/p remote PTCA and stenting of RCA in 1999 but sounds like he has done well from a cardiac standpoint for the last several years.  Please route response to P CV DIV PREOP.  Thank you!

## 2019-09-17 NOTE — Patient Instructions (Signed)
DUE TO COVID-19 ONLY ONE VISITOR IS ALLOWED TO COME WITH YOU AND STAY IN THE WAITING ROOM ONLY DURING PRE OP AND PROCEDURE DAY OF SURGERY. THE 1 VISITOR MAY VISIT WITH YOU AFTER SURGERY IN YOUR PRIVATE ROOM DURING VISITING HOURS ONLY!  YOU NEED TO HAVE A COVID 19 TEST ON_2/23/21______ @_2 :30______, THIS TEST MUST BE DONE BEFORE SURGERY, COME  [address], [place] , [address].  ([place]) ONCE YOUR COVID TEST IS COMPLETED, PLEASE BEGIN THE QUARANTINE INSTRUCTIONS AS OUTLINED IN YOUR HANDOUT.                Edward [name]    Your procedure is scheduled on: 09/26/19   Report to [facility] Main  Entrance   Report to Short Stay at 5:30  AM     Call this number if you have problems the morning of surgery [phone]    . BRUSH YOUR TEETH MORNING OF SURGERY AND RINSE YOUR MOUTH OUT, NO CHEWING GUM CANDY OR MINTS.    Do not eat food After Midnight.   YOU MAY HAVE CLEAR LIQUIDS FROM MIDNIGHT UNTIL 4:30AM  . At 4:30AM Please finish the prescribed Pre-Surgery  drink  . Nothing by mouth after you finish the  drink !    Take these medicines the morning of surgery with A SIP OF WATER: Amlodipine                                You may not have any metal on your body including              piercings  Do not wear jewelry, lotions, powders or  deodorant .              Men may shave face and neck.   Do not bring valuables to the hospital. [place].  Contacts, dentures or bridgework may not be worn into surgery.      Patients discharged the day of surgery will not be allowed to drive home.   IF YOU ARE HAVING SURGERY AND GOING HOME THE SAME DAY, YOU MUST HAVE AN ADULT TO DRIVE YOU HOME AND BE WITH YOU FOR 24 HOURS.   YOU MAY GO HOME BY TAXI OR UBER OR ORTHERWISE, BUT AN ADULT MUST ACCOMPANY YOU HOME AND STAY WITH YOU FOR 24 HOURS.  Name and phone number of your driver:  Special Instructions: N/A               Please read over the following fact sheets you were given: _____________________________________________________________________             [facility] - Preparing for Surgery  Before surgery, you can play an important role.   Because skin is not sterile, your skin needs to be as free of germs as possible .  You can reduce the number of germs on your skin by washing with CHG (chlorahexidine gluconate) soap before surgery.   CHG is an antiseptic cleaner which kills germs and bonds with the skin to continue killing germs even after washing. Please DO NOT use if you have an allergy to CHG or antibacterial soaps.   If your skin becomes reddened/irritated stop using the CHG and inform your nurse when you arrive at Short Stay.  You may shave your face/neck.  Please follow these instructions carefully:  1.  Shower with CHG Soap the night before surgery and the  morning of Surgery.  2.  If you choose to wash your hair, wash your hair first as usual with your  normal  shampoo.  3.  After you shampoo, rinse your hair and body thoroughly to remove the  shampoo.                                        4.  Use CHG as you would any other liquid soap.  You can apply chg directly  to the skin and wash                       Gently with a scrungie or clean washcloth.  5.  Apply the CHG Soap to your body ONLY FROM THE NECK DOWN.   Do not use on face/ open                           Wound or open sores. Avoid contact with eyes, ears mouth and genitals (private parts).                       Wash face,  Genitals (private parts) with your normal soap.             6.  Wash thoroughly, paying special attention to the area where your surgery  will be performed.  7.  Thoroughly rinse your body with warm water from the neck down.  8.  DO NOT shower/wash with your normal soap after using and rinsing off  the CHG Soap.             9.  Pat yourself dry with a clean towel.            10.  Wear clean pajamas.             11.  Place clean sheets on your bed the night of your first shower and do not  sleep with pets. Day of Surgery : Do not apply any lotions/deodorants the morning of surgery.  Please wear clean clothes to the hospital/surgery center.  FAILURE TO FOLLOW THESE INSTRUCTIONS MAY RESULT IN THE CANCELLATION OF YOUR SURGERY PATIENT SIGNATURE_________________________________  NURSE SIGNATURE__________________________________  ________________________________________________________________________   Edward [name]  An incentive spirometer is a tool that can help keep your lungs clear and active. This tool measures how well you are filling your lungs with each breath. Taking long deep breaths may help reverse or decrease the chance of developing breathing (pulmonary) problems (especially infection) following:  A long period of time when you are unable to move or be active. BEFORE THE PROCEDURE   If the spirometer includes an indicator to show your best effort, your nurse or respiratory therapist will set it to a desired goal.  If possible, sit up straight or lean slightly forward. Try not to slouch.  Hold the incentive spirometer in an upright position. INSTRUCTIONS FOR USE  1. Sit on the edge of your bed if possible, or sit up as far as you can in bed or on a chair. 2. Hold the incentive spirometer in an upright position. 3. Breathe out normally. 4. Place the mouthpiece in your mouth and seal your lips tightly around it. 5. Breathe in slowly and  as deeply as possible, raising the piston or the ball toward the top of the column. 6. Hold your breath for 3-5 seconds or for as long as possible. Allow the piston or ball to fall to the bottom of the column. 7. Remove the mouthpiece from your mouth and breathe out normally. 8. Rest for a few seconds and repeat Steps 1 through 7 at least 10 times every 1-2 hours when you are awake. Take your time and take a few normal breaths between  deep breaths. 9. The spirometer may include an indicator to show your best effort. Use the indicator as a goal to work toward during each repetition. 10. After each set of 10 deep breaths, practice coughing to be sure your lungs are clear. If you have an incision (the cut made at the time of surgery), support your incision when coughing by placing a pillow or rolled up towels firmly against it. Once you are able to get out of bed, walk around indoors and cough well. You may stop using the incentive spirometer when instructed by your caregiver.  RISKS AND COMPLICATIONS  Take your time so you do not get dizzy or light-headed.  If you are in pain, you may need to take or ask for pain medication before doing incentive spirometry. It is harder to take a deep breath if you are having pain. AFTER USE  Rest and breathe slowly and easily.  It can be helpful to keep track of a log of your progress. Your caregiver can provide you with a simple table to help with this. If you are using the spirometer at home, follow these instructions: [place] IF:   You are having difficultly using the spirometer.  You have trouble using the spirometer as often as instructed.  Your pain medication is not giving enough relief while using the spirometer.  You develop fever of 100.5 F (38.1 C) or higher. SEEK IMMEDIATE MEDICAL CARE IF:   You cough up bloody sputum that had not been present before.  You develop fever of 102 F (38.9 C) or greater.  You develop worsening pain at or near the incision site. MAKE SURE YOU:   Understand these instructions.  Will watch your condition.  Will get help right away if you are not doing well or get worse. Document Released: 11/27/2006 Document Revised: 10/09/2011 Document Reviewed: 01/28/2007 [facility] Patient Information 2014 [place], [place].   ________________________________________________________________________

## 2019-09-18 ENCOUNTER — Encounter (HOSPITAL_COMMUNITY)
Admission: RE | Admit: 2019-09-18 | Discharge: 2019-09-18 | Disposition: A | Discharge status: Home / Self Care | Payer: Federal, State, Local not specified - PPO | Source: Ambulatory Visit | Attending: Orthopedic Surgery | Admitting: Orthopedic Surgery

## 2019-09-18 ENCOUNTER — Encounter (HOSPITAL_COMMUNITY): Payer: Self-pay

## 2019-09-18 ENCOUNTER — Other Ambulatory Visit: Payer: Self-pay

## 2019-09-18 ENCOUNTER — Encounter (HOSPITAL_COMMUNITY): Payer: Federal, State, Local not specified - PPO

## 2019-09-18 NOTE — Progress Notes (Signed)
PCP - Dr. Beckie Salts Cardiologist - Dr. Cathie Olden  Chest x-ray - 04/18/20 EKG - 08/27/19 Stress Test - no ECHO - 07/18/19 Cardiac Cath - 1999  Sleep Study - no CPAP -   Fasting Blood Sugar - NA Checks Blood Sugar _____ times a day  Blood Thinner Instructions:ASA Aspirin Instructions:Pt will call Dr. Cathie Olden and Dr. Berenice Primas to ask about when to stop  Last Dose:He will stop himself 09/21/19  Anesthesia review:   Patient denies shortness of breath, fever, cough and chest pain at PAT appointment yes  Patient verbalized understanding of instructions that were given to them at the PAT appointment. Patient was also instructed that they will need to review over the PAT instructions again at home before surgery. yes

## 2019-09-19 ENCOUNTER — Ambulatory Visit (HOSPITAL_COMMUNITY)
Admission: RE | Admit: 2019-09-19 | Discharge: 2019-09-19 | Disposition: A | Discharge status: Home / Self Care | Payer: Federal, State, Local not specified - PPO | Source: Ambulatory Visit | Attending: Orthopedic Surgery | Admitting: Orthopedic Surgery

## 2019-09-19 ENCOUNTER — Encounter (HOSPITAL_COMMUNITY)
Admission: RE | Admit: 2019-09-19 | Discharge: 2019-09-19 | Disposition: A | Discharge status: Home / Self Care | Payer: Federal, State, Local not specified - PPO | Source: Ambulatory Visit | Attending: Orthopedic Surgery | Admitting: Orthopedic Surgery

## 2019-09-19 DIAGNOSIS — Z01811 Encounter for preprocedural respiratory examination: Secondary | ICD-10-CM | POA: Insufficient documentation

## 2019-09-19 DIAGNOSIS — J9811 Atelectasis: Secondary | ICD-10-CM | POA: Diagnosis not present

## 2019-09-19 DIAGNOSIS — Z01818 Encounter for other preprocedural examination: Secondary | ICD-10-CM | POA: Diagnosis not present

## 2019-09-19 LAB — SURGICAL PCR SCREEN
MRSA, PCR: NEGATIVE
Staphylococcus aureus: NEGATIVE

## 2019-09-19 LAB — URINALYSIS, ROUTINE W REFLEX MICROSCOPIC
Bilirubin Urine: NEGATIVE
Glucose, UA: NEGATIVE mg/dL
Hgb urine dipstick: NEGATIVE
Ketones, ur: NEGATIVE mg/dL
Leukocytes,Ua: NEGATIVE
Nitrite: NEGATIVE
Protein, ur: NEGATIVE mg/dL
Specific Gravity, Urine: 1.02 (ref 1.005–1.030)
pH: 6 (ref 5.0–8.0)

## 2019-09-19 LAB — CBC WITH DIFFERENTIAL/PLATELET
Abs Immature Granulocytes: 0.01 10*3/uL (ref 0.00–0.07)
Basophils Absolute: 0 10*3/uL (ref 0.0–0.1)
Basophils Relative: 1 %
Eosinophils Absolute: 0.1 10*3/uL (ref 0.0–0.5)
Eosinophils Relative: 3 %
HCT: 38.2 % — ABNORMAL LOW (ref 39.0–52.0)
Hemoglobin: 13.1 g/dL (ref 13.0–17.0)
Immature Granulocytes: 0 %
Lymphocytes Relative: 36 %
Lymphs Abs: 1.2 10*3/uL (ref 0.7–4.0)
MCH: 32.4 pg (ref 26.0–34.0)
MCHC: 34.3 g/dL (ref 30.0–36.0)
MCV: 94.6 fL (ref 80.0–100.0)
Monocytes Absolute: 0.6 10*3/uL (ref 0.1–1.0)
Monocytes Relative: 17 %
Neutro Abs: 1.4 10*3/uL — ABNORMAL LOW (ref 1.7–7.7)
Neutrophils Relative %: 43 %
Platelets: 251 10*3/uL (ref 150–400)
RBC: 4.04 MIL/uL — ABNORMAL LOW (ref 4.22–5.81)
RDW: 12.6 % (ref 11.5–15.5)
WBC: 3.3 10*3/uL — ABNORMAL LOW (ref 4.0–10.5)
nRBC: 0 % (ref 0.0–0.2)

## 2019-09-19 LAB — APTT: aPTT: 32 seconds (ref 24–36)

## 2019-09-19 LAB — COMPREHENSIVE METABOLIC PANEL
ALT: 18 U/L (ref 0–44)
AST: 20 U/L (ref 15–41)
Albumin: 4 g/dL (ref 3.5–5.0)
Alkaline Phosphatase: 67 U/L (ref 38–126)
Anion gap: 9 (ref 5–15)
BUN: 18 mg/dL (ref 8–23)
CO2: 23 mmol/L (ref 22–32)
Calcium: 8.9 mg/dL (ref 8.9–10.3)
Chloride: 107 mmol/L (ref 98–111)
Creatinine, Ser: 0.95 mg/dL (ref 0.61–1.24)
GFR calc Af Amer: >60 mL/min (ref >60)
GFR calc non Af Amer: >60 mL/min (ref >60)
Glucose, Bld: 97 mg/dL (ref 70–99)
Potassium: 3.4 mmol/L — ABNORMAL LOW (ref 3.5–5.1)
Sodium: 139 mmol/L (ref 135–145)
Total Bilirubin: 0.8 mg/dL (ref 0.3–1.2)
Total Protein: 6.7 g/dL (ref 6.5–8.1)

## 2019-09-19 LAB — PROTIME-INR
INR: 1.1 (ref 0.8–1.2)
Prothrombin Time: 13.6 seconds (ref 11.4–15.2)

## 2019-09-22 NOTE — Progress Notes (Signed)
Anesthesia Chart Review   Case: [id number] Date/Time: 09/26/19 0700   Procedure: TOTAL KNEE ARTHROPLASTY (Right Knee)   Anesthesia type: Spinal   Pre-op diagnosis: OSTEOARTHRITIS RIGHT KNEE   Location: [facility]OM 08 / [facility]   Surgeons: Dorna Leitz, MD      DISCUSSION:69 y.o. former smoker (20 pack years, quit 11/01/88) with h/o HTN, CAD (PCI 1999), right knee OA scheduled for above procedure 09/26/19 with Dr. Dorna Leitz.   Pt cleared by cardiology 08/29/19.  Per Sande Rives, PA-C, "Patient was last seen by Dr. Acie Fredrickson on 07/08/2019 at which time he was having some lower extremity edema but was otherwise doing well from a cardiac standpoint. An Echo was ordered and showed normal LV systolic function, normal wall motion, and grade 1 diastolic dysfunction. Edema suspected to be due to Amlodipine. I called and spoke with patient today and he reports no changes since last visit.   Per Dr. Acie Fredrickson: "Gia is at low risk for his surgery. He may hold his ASA for 5-7 days prior to surgery.""  Anticipate pt can proceed with planned procedure barring acute status change.   VS: BP (!) 135/92   Pulse 61   Temp 36.6 C (Oral)   Resp 18   Ht 6' (1.829 m)   Wt 93.6 kg   SpO2 100%   BMI 27.99 kg/m   PROVIDERS: Maurice Small, MD is PCP   Mertie Moores, MD is Cardiologist  LABS: Labs reviewed: Acceptable for surgery. (all labs ordered are listed, but only abnormal results are displayed)  Labs Reviewed  CBC WITH DIFFERENTIAL/PLATELET - Abnormal; Notable for the following components:      Result Value   WBC 3.3 (*)    RBC 4.04 (*)    HCT 38.2 (*)    Neutro Abs 1.4 (*)    All other components within normal limits  COMPREHENSIVE METABOLIC PANEL - Abnormal; Notable for the following components:   Potassium 3.4 (*)    All other components within normal limits  SURGICAL PCR SCREEN  APTT  PROTIME-INR  URINALYSIS, ROUTINE W REFLEX MICROSCOPIC  TYPE AND SCREEN      IMAGES:   EKG: 07/08/2019 Rate 63 bpm NSR No acute changes Left axis deviation   CV: Echo 07/18/2019 IMPRESSIONS    1. Left ventricular ejection fraction, by visual estimation, is 55 to  60%. The left ventricle has normal function. There is mildly increased  left ventricular hypertrophy.  2. Left ventricular diastolic parameters are consistent with Grade I  diastolic dysfunction (impaired relaxation).  3. The left ventricle has no regional wall motion abnormalities.  4. Global right ventricle has normal systolic function.The right  ventricular size is normal.  5. Left atrial size was normal.  6. Right atrial size was normal.  7. The mitral valve is normal in structure. No evidence of mitral valve  regurgitation. No evidence of mitral stenosis.  8. The tricuspid valve is normal in structure. Tricuspid valve  regurgitation is trivial.  9. The aortic valve is tricuspid. Aortic valve regurgitation is not  visualized. Mild aortic valve sclerosis without stenosis.  10. The pulmonic valve was normal in structure. Pulmonic valve  regurgitation is trivial.  11. Normal LV systolic function; mild LVH: grade 1 diastolic dysfunction.   Stress Test 12/20/2005 Overall Impression: Low risk nuclear study.  He has minimal attenuation of the inferior basal segments.  He has a known history of a previous inferior MI.  His LV function is normal.  Past Medical History:  Diagnosis Date  . Chicken pox   . Coronary artery disease    a. 1999 s/p MI with cath/PCI;  b. 05/1999 Ex Cardiolite EF 68%, no ishcemia.  . Dyslipidemia   . Family history of breast cancer   . Family history of oral cancer   . History of tobacco abuse   . Hypertension   . Hypokalemia   . Injury of cervical spine ([place])    a. 02/2012 C4/5  . Measles   . Mumps   . Nasal bones, closed fracture    a. 02/2012 in setting of presyncope/fall  . Near syncope   . Pneumonia   . Prostate cancer ([place])   . Whooping  cough     Past Surgical History:  Procedure Laterality Date  . [place]  . ANTERIOR CERVICAL DECOMP/DISCECTOMY FUSION  03/13/2012   Procedure: ANTERIOR CERVICAL DECOMPRESSION/DISCECTOMY FUSION 1 LEVEL;  Surgeon: Erline Levine, MD;  Location: [facility];  Service: Neurosurgery;  Laterality: N/A;  Anterior Cervical Decompression/Fusion. Cervical four-five.  Marland Kitchen CARDIAC CATHETERIZATION  06/28/1998   single vessel CAD involving the distal RCA/PTCA and stenting of the distal RCA//EF- 50-55%  . CLOSED REDUCTION NASAL FRACTURE  03/13/2012   Procedure: CLOSED REDUCTION NASAL FRACTURE;  Surgeon: Theodoro Kos, DO;  Location: [facility];  Service: Plastics;  Laterality: N/A;  Internal and external splinting of nasal fracture  . LYMPHADENECTOMY Bilateral 02/14/2018   Procedure: [facility];  Surgeon: Raynelle Bring, MD;  [facility];  Service: Urology;  Laterality: Bilateral;  . NM MYOVIEW LTD  05/17/2009   normal stress nuclear study/no evidence of ischemia/EF- 68%  . petalla tendon surgery  1989  . ROBOT ASSISTED LAPAROSCOPIC RADICAL PROSTATECTOMY N/A 02/14/2018   Procedure: XI ROBOTIC ASSISTED LAPAROSCOPIC RADICAL PROSTATECTOMY;  Surgeon: Raynelle Bring, MD;  [facility];  Service: Urology;  Laterality: N/A;    MEDICATIONS: . amLODipine (NORVASC) 10 MG tablet  . aspirin EC 81 MG tablet  . atorvastatin (LIPITOR) 40 MG tablet  . cholecalciferol (VITAMIN D) 1000 units tablet  . docusate sodium (COLACE) 100 MG capsule  . ferrous sulfate 325 (65 FE) MG tablet  . furosemide (LASIX) 40 MG tablet  . Magnesium 250 MG TABS  . meloxicam (MOBIC) 15 MG tablet  . Multiple Vitamin (MULTIVITAMIN) tablet  . nitroGLYCERIN (NITROSTAT) 0.4 MG SL tablet  . Omega-3 Fatty Acids (FISH OIL) 1000 MG CAPS  . vitamin B-12 (CYANOCOBALAMIN) 1000 MCG tablet   No current facility-administered medications for this encounter.     Maia Plan [facility]  Testing [phone] 09/22/19  2:02 PM

## 2019-09-22 NOTE — Anesthesia Preprocedure Evaluation (Addendum)
Anesthesia Evaluation  Patient identified by MRN, date of birth, ID band Patient awake    Reviewed: Allergy & Precautions, NPO status , Patient's Chart, lab work & pertinent test results  Airway Mallampati: I       Dental  (+) Poor Dentition   Pulmonary former smoker,    Pulmonary exam normal breath sounds clear to auscultation       Cardiovascular hypertension, Pt. on medications Normal cardiovascular exam Rhythm:Regular Rate:Normal     Neuro/Psych negative neurological ROS  negative psych ROS   GI/Hepatic negative GI ROS, Neg liver ROS,   Endo/Other  negative endocrine ROS  Renal/GU negative Renal ROS  negative genitourinary   Musculoskeletal   Abdominal Normal abdominal exam  (+)   Peds  Hematology negative hematology ROS (+)   Anesthesia Other Findings   Reproductive/Obstetrics                                                            Anesthesia Evaluation  Patient identified by MRN, date of birth, ID band Patient awake    Reviewed: Allergy & Precautions, H&P , NPO status , Patient's Chart, lab work & pertinent test results  Airway Mallampati: II   Neck ROM: full    Dental   Pulmonary former smoker,    breath sounds clear to auscultation       Cardiovascular hypertension, + CAD, + Past MI, + Cardiac Stents and + Peripheral Vascular Disease   Rhythm:regular Rate:Normal  MI 1999. PCI. Myoview 2010 no ischemia. Currently denies CP.  Dr Acie Fredrickson cardiologist   Neuro/Psych    GI/Hepatic   Endo/Other    Renal/GU      Musculoskeletal   Abdominal   Peds  Hematology   Anesthesia Other Findings   Reproductive/Obstetrics                            Anesthesia Physical Anesthesia Plan  ASA: III  Anesthesia Plan: General   Post-op Pain Management:    Induction: Intravenous  PONV Risk Score and Plan: 2 and Ondansetron,  Dexamethasone, Midazolam and Treatment may vary due to age or medical condition  Airway Management Planned: Oral ETT  Additional Equipment:   Intra-op Plan:   Post-operative Plan: Extubation in OR  Informed Consent: I have reviewed the patients History and Physical, chart, labs and discussed the procedure including the risks, benefits and alternatives for the proposed anesthesia with the patient or authorized representative who has indicated his/her understanding and acceptance.     Plan Discussed with: CRNA and Anesthesiologist  Anesthesia Plan Comments:         Anesthesia Quick Evaluation  Anesthesia Physical Anesthesia Plan  ASA: II  Anesthesia Plan: Spinal   Post-op Pain Management:  Regional for Post-op pain   Induction:   PONV Risk Score and Plan: 1 and Ondansetron, Propofol infusion and Midazolam  Airway Management Planned: Natural Airway, Simple Face Mask and Nasal Cannula  Additional Equipment: None  Intra-op Plan:   Post-operative Plan:   Informed Consent: I have reviewed the patients History and Physical, chart, labs and discussed the procedure including the risks, benefits and alternatives for the proposed anesthesia with the patient or authorized representative who has indicated his/her understanding and acceptance.  Plan Discussed with: CRNA  Anesthesia Plan Comments: (See PAT note 09/19/19, Konrad Felix, PA-C)      Anesthesia Quick Evaluation

## 2019-09-23 ENCOUNTER — Other Ambulatory Visit (HOSPITAL_COMMUNITY)
Admission: RE | Admit: 2019-09-23 | Discharge: 2019-09-23 | Disposition: A | Discharge status: Home / Self Care | Payer: Federal, State, Local not specified - PPO | Source: Ambulatory Visit | Attending: Orthopedic Surgery | Admitting: Orthopedic Surgery

## 2019-09-23 DIAGNOSIS — Z01812 Encounter for preprocedural laboratory examination: Secondary | ICD-10-CM | POA: Insufficient documentation

## 2019-09-23 DIAGNOSIS — Z20822 Contact with and (suspected) exposure to covid-19: Secondary | ICD-10-CM | POA: Diagnosis not present

## 2019-09-23 LAB — SARS CORONAVIRUS 2 (TAT 6-24 HRS): SARS Coronavirus 2: NEGATIVE

## 2019-09-25 MED ORDER — BUPIVACAINE LIPOSOME 1.3 % IJ SUSP
20.0000 mL | INTRAMUSCULAR | Status: DC
  Filled 2019-09-25: qty 20

## 2019-09-26 ENCOUNTER — Observation Stay (HOSPITAL_COMMUNITY)
Admission: AD | Admit: 2019-09-26 | Discharge: 2019-09-27 | Disposition: A | Discharge status: Home Health | Payer: Federal, State, Local not specified - PPO | Attending: Orthopedic Surgery | Admitting: Orthopedic Surgery

## 2019-09-26 ENCOUNTER — Encounter (HOSPITAL_COMMUNITY)
Admission: AD | Disposition: A | Discharge status: Home Health | Payer: Self-pay | Source: Home / Self Care | Attending: Orthopedic Surgery

## 2019-09-26 ENCOUNTER — Ambulatory Visit (HOSPITAL_COMMUNITY): Payer: Federal, State, Local not specified - PPO | Admitting: Certified Registered Nurse Anesthetist

## 2019-09-26 ENCOUNTER — Ambulatory Visit (HOSPITAL_COMMUNITY): Payer: Federal, State, Local not specified - PPO | Admitting: Physician Assistant

## 2019-09-26 ENCOUNTER — Encounter (HOSPITAL_COMMUNITY): Payer: Self-pay | Admitting: Orthopedic Surgery

## 2019-09-26 ENCOUNTER — Other Ambulatory Visit: Payer: Self-pay

## 2019-09-26 DIAGNOSIS — Z96659 Presence of unspecified artificial knee joint: Secondary | ICD-10-CM

## 2019-09-26 DIAGNOSIS — R918 Other nonspecific abnormal finding of lung field: Secondary | ICD-10-CM | POA: Insufficient documentation

## 2019-09-26 DIAGNOSIS — Z808 Family history of malignant neoplasm of other organs or systems: Secondary | ICD-10-CM | POA: Diagnosis not present

## 2019-09-26 DIAGNOSIS — Z955 Presence of coronary angioplasty implant and graft: Secondary | ICD-10-CM | POA: Diagnosis not present

## 2019-09-26 DIAGNOSIS — Z8546 Personal history of malignant neoplasm of prostate: Secondary | ICD-10-CM | POA: Diagnosis not present

## 2019-09-26 DIAGNOSIS — Z9079 Acquired absence of other genital organ(s): Secondary | ICD-10-CM | POA: Insufficient documentation

## 2019-09-26 DIAGNOSIS — E876 Hypokalemia: Secondary | ICD-10-CM | POA: Diagnosis not present

## 2019-09-26 DIAGNOSIS — Z803 Family history of malignant neoplasm of breast: Secondary | ICD-10-CM | POA: Insufficient documentation

## 2019-09-26 DIAGNOSIS — Z791 Long term (current) use of non-steroidal anti-inflammatories (NSAID): Secondary | ICD-10-CM | POA: Diagnosis not present

## 2019-09-26 DIAGNOSIS — M1711 Unilateral primary osteoarthritis, right knee: Secondary | ICD-10-CM | POA: Diagnosis not present

## 2019-09-26 DIAGNOSIS — I10 Essential (primary) hypertension: Secondary | ICD-10-CM | POA: Insufficient documentation

## 2019-09-26 DIAGNOSIS — I2510 Atherosclerotic heart disease of native coronary artery without angina pectoris: Secondary | ICD-10-CM | POA: Insufficient documentation

## 2019-09-26 DIAGNOSIS — G8929 Other chronic pain: Secondary | ICD-10-CM | POA: Diagnosis not present

## 2019-09-26 DIAGNOSIS — Z981 Arthrodesis status: Secondary | ICD-10-CM | POA: Diagnosis not present

## 2019-09-26 DIAGNOSIS — Z79899 Other long term (current) drug therapy: Secondary | ICD-10-CM | POA: Insufficient documentation

## 2019-09-26 DIAGNOSIS — E785 Hyperlipidemia, unspecified: Secondary | ICD-10-CM | POA: Insufficient documentation

## 2019-09-26 DIAGNOSIS — Z8249 Family history of ischemic heart disease and other diseases of the circulatory system: Secondary | ICD-10-CM | POA: Insufficient documentation

## 2019-09-26 DIAGNOSIS — I252 Old myocardial infarction: Secondary | ICD-10-CM | POA: Diagnosis not present

## 2019-09-26 DIAGNOSIS — G8918 Other acute postprocedural pain: Secondary | ICD-10-CM | POA: Diagnosis not present

## 2019-09-26 DIAGNOSIS — Z87891 Personal history of nicotine dependence: Secondary | ICD-10-CM | POA: Insufficient documentation

## 2019-09-26 DIAGNOSIS — Z7982 Long term (current) use of aspirin: Secondary | ICD-10-CM | POA: Insufficient documentation

## 2019-09-26 HISTORY — PX: TOTAL KNEE ARTHROPLASTY: SHX125

## 2019-09-26 LAB — TYPE AND SCREEN
ABO/RH(D): B POS
Antibody Screen: NEGATIVE

## 2019-09-26 SURGERY — ARTHROPLASTY, KNEE, TOTAL
Anesthesia: Spinal | Site: Knee | Laterality: Right

## 2019-09-26 MED ORDER — OXYCODONE HCL 5 MG PO TABS
5.0000 mg | ORAL_TABLET | ORAL | Status: DC | PRN
  Administered 2019-09-26: 10 mg via ORAL
  Administered 2019-09-26: 5 mg via ORAL
  Administered 2019-09-26 – 2019-09-27 (×4): 10 mg via ORAL
  Filled 2019-09-26 (×4): qty 2

## 2019-09-26 MED ORDER — FENTANYL CITRATE (PF) 100 MCG/2ML IJ SOLN: 25.0000 ug | INTRAMUSCULAR | Status: DC | PRN

## 2019-09-26 MED ORDER — FENTANYL CITRATE (PF) 100 MCG/2ML IJ SOLN
INTRAMUSCULAR | Status: AC
  Filled 2019-09-26: qty 2

## 2019-09-26 MED ORDER — ONDANSETRON HCL 4 MG/2ML IJ SOLN
INTRAMUSCULAR | Status: DC | PRN
  Administered 2019-09-26: 4 mg via INTRAVENOUS

## 2019-09-26 MED ORDER — KETOROLAC TROMETHAMINE 15 MG/ML IJ SOLN
15.0000 mg | Freq: Once | INTRAMUSCULAR | Status: AC
  Administered 2019-09-26: 15 mg via INTRAVENOUS

## 2019-09-26 MED ORDER — DIPHENHYDRAMINE HCL 12.5 MG/5ML PO ELIX: 12.5000 mg | ORAL_SOLUTION | ORAL | Status: DC | PRN

## 2019-09-26 MED ORDER — BUPIVACAINE-EPINEPHRINE 0.5% -1:200000 IJ SOLN
INTRAMUSCULAR | Status: AC
  Filled 2019-09-26: qty 1

## 2019-09-26 MED ORDER — ROPIVACAINE HCL 7.5 MG/ML IJ SOLN
INTRAMUSCULAR | Status: DC | PRN
  Administered 2019-09-26 (×4): 5 mL via PERINEURAL

## 2019-09-26 MED ORDER — CELECOXIB 200 MG PO CAPS: 200.0000 mg | ORAL_CAPSULE | Freq: Two times a day (BID) | ORAL | 0 refills | Status: DC

## 2019-09-26 MED ORDER — SODIUM CHLORIDE 0.9% FLUSH
INTRAVENOUS | Status: DC | PRN
  Administered 2019-09-26: 50 mL via INTRAVENOUS

## 2019-09-26 MED ORDER — BUPIVACAINE-EPINEPHRINE 0.5% -1:200000 IJ SOLN
INTRAMUSCULAR | Status: DC | PRN
  Administered 2019-09-26: 30 mL

## 2019-09-26 MED ORDER — LACTATED RINGERS IV BOLUS
500.0000 mL | Freq: Once | INTRAVENOUS | Status: AC
  Administered 2019-09-26: 500 mL via INTRAVENOUS

## 2019-09-26 MED ORDER — ONDANSETRON HCL 4 MG/2ML IJ SOLN
INTRAMUSCULAR | Status: AC
  Filled 2019-09-26: qty 2

## 2019-09-26 MED ORDER — DEXAMETHASONE SODIUM PHOSPHATE 10 MG/ML IJ SOLN
INTRAMUSCULAR | Status: AC
  Filled 2019-09-26: qty 1

## 2019-09-26 MED ORDER — NITROGLYCERIN 0.4 MG SL SUBL: 0.4000 mg | SUBLINGUAL_TABLET | SUBLINGUAL | Status: DC | PRN

## 2019-09-26 MED ORDER — EPHEDRINE 5 MG/ML INJ
INTRAVENOUS | Status: AC
  Filled 2019-09-26: qty 10

## 2019-09-26 MED ORDER — OXYCODONE HCL 5 MG PO TABS
ORAL_TABLET | ORAL | Status: AC
  Filled 2019-09-26: qty 1

## 2019-09-26 MED ORDER — ACETAMINOPHEN 325 MG PO TABS: 325.0000 mg | ORAL_TABLET | ORAL | Status: DC | PRN

## 2019-09-26 MED ORDER — LACTATED RINGERS IV SOLN: INTRAVENOUS | Status: DC

## 2019-09-26 MED ORDER — CLONIDINE HCL (ANALGESIA) 100 MCG/ML EP SOLN
EPIDURAL | Status: DC | PRN
  Administered 2019-09-26: 100 ug

## 2019-09-26 MED ORDER — POVIDONE-IODINE 10 % EX SWAB: 2.0000 "application " | Freq: Once | CUTANEOUS | Status: DC

## 2019-09-26 MED ORDER — TRANEXAMIC ACID-NACL 1000-0.7 MG/100ML-% IV SOLN
INTRAVENOUS | Status: AC
  Filled 2019-09-26: qty 100

## 2019-09-26 MED ORDER — CEFAZOLIN SODIUM-DEXTROSE 2-4 GM/100ML-% IV SOLN
INTRAVENOUS | Status: AC
  Filled 2019-09-26: qty 100

## 2019-09-26 MED ORDER — SODIUM CHLORIDE (PF) 0.9 % IJ SOLN
INTRAMUSCULAR | Status: AC
  Filled 2019-09-26: qty 50

## 2019-09-26 MED ORDER — METHOCARBAMOL 500 MG IVPB - SIMPLE MED
INTRAVENOUS | Status: AC
  Filled 2019-09-26: qty 50

## 2019-09-26 MED ORDER — MEPIVACAINE HCL (PF) 2 % IJ SOLN
INTRAMUSCULAR | Status: DC | PRN
  Administered 2019-09-26: 60 mg via INTRATHECAL

## 2019-09-26 MED ORDER — WATER FOR IRRIGATION, STERILE IR SOLN
Status: DC | PRN
  Administered 2019-09-26 (×2): 1000 mL

## 2019-09-26 MED ORDER — TRANEXAMIC ACID-NACL 1000-0.7 MG/100ML-% IV SOLN
1000.0000 mg | Freq: Once | INTRAVENOUS | Status: AC
  Administered 2019-09-26: 1000 mg via INTRAVENOUS

## 2019-09-26 MED ORDER — OXYCODONE HCL 5 MG PO TABS
ORAL_TABLET | ORAL | Status: AC
  Filled 2019-09-26: qty 2

## 2019-09-26 MED ORDER — TRANEXAMIC ACID-NACL 1000-0.7 MG/100ML-% IV SOLN
1000.0000 mg | INTRAVENOUS | Status: AC
  Administered 2019-09-26: 1000 mg via INTRAVENOUS
  Filled 2019-09-26: qty 100

## 2019-09-26 MED ORDER — OXYCODONE HCL 5 MG/5ML PO SOLN: 5.0000 mg | Freq: Once | ORAL | Status: AC | PRN

## 2019-09-26 MED ORDER — ASPIRIN EC 325 MG PO TBEC
325.0000 mg | DELAYED_RELEASE_TABLET | Freq: Two times a day (BID) | ORAL | Status: DC
  Administered 2019-09-26 – 2019-09-27 (×2): 325 mg via ORAL
  Filled 2019-09-26 (×2): qty 1

## 2019-09-26 MED ORDER — ACETAMINOPHEN 160 MG/5ML PO SOLN: 325.0000 mg | ORAL | Status: DC | PRN

## 2019-09-26 MED ORDER — CEFAZOLIN SODIUM-DEXTROSE 2-4 GM/100ML-% IV SOLN
2.0000 g | Freq: Four times a day (QID) | INTRAVENOUS | Status: AC
  Administered 2019-09-26 (×2): 2 g via INTRAVENOUS
  Filled 2019-09-26: qty 100

## 2019-09-26 MED ORDER — BUPIVACAINE HCL (PF) 0.5 % IJ SOLN
INTRAMUSCULAR | Status: AC
  Filled 2019-09-26: qty 30

## 2019-09-26 MED ORDER — ROPIVACAINE HCL 5 MG/ML IJ SOLN
INTRAMUSCULAR | Status: DC | PRN
  Administered 2019-09-26 (×2): 5 mL via PERINEURAL

## 2019-09-26 MED ORDER — EPHEDRINE SULFATE-NACL 50-0.9 MG/10ML-% IV SOSY
PREFILLED_SYRINGE | INTRAVENOUS | Status: DC | PRN
  Administered 2019-09-26 (×5): 10 mg via INTRAVENOUS

## 2019-09-26 MED ORDER — DEXAMETHASONE SODIUM PHOSPHATE 10 MG/ML IJ SOLN
10.0000 mg | Freq: Once | INTRAMUSCULAR | Status: AC
  Administered 2019-09-26: 10 mg via INTRAVENOUS
  Filled 2019-09-26: qty 1

## 2019-09-26 MED ORDER — OXYCODONE HCL 5 MG PO TABS: 5.0000 mg | ORAL_TABLET | Freq: Once | ORAL | Status: AC | PRN

## 2019-09-26 MED ORDER — METHOCARBAMOL 500 MG IVPB - SIMPLE MED
500.0000 mg | Freq: Four times a day (QID) | INTRAVENOUS | Status: DC | PRN
  Administered 2019-09-26: 500 mg via INTRAVENOUS
  Filled 2019-09-26: qty 50

## 2019-09-26 MED ORDER — OXYCODONE-ACETAMINOPHEN 5-325 MG PO TABS: 1.0000 | ORAL_TABLET | Freq: Four times a day (QID) | ORAL | 0 refills | Status: DC | PRN

## 2019-09-26 MED ORDER — DEXAMETHASONE SODIUM PHOSPHATE 10 MG/ML IJ SOLN
10.0000 mg | Freq: Two times a day (BID) | INTRAMUSCULAR | Status: DC
  Administered 2019-09-26 – 2019-09-27 (×2): 10 mg via INTRAVENOUS
  Filled 2019-09-26 (×2): qty 1

## 2019-09-26 MED ORDER — ASPIRIN EC 325 MG PO TBEC: 325.0000 mg | DELAYED_RELEASE_TABLET | Freq: Two times a day (BID) | ORAL | 0 refills | Status: DC

## 2019-09-26 MED ORDER — PROPOFOL 10 MG/ML IV BOLUS
INTRAVENOUS | Status: AC
  Filled 2019-09-26: qty 40

## 2019-09-26 MED ORDER — ONDANSETRON HCL 4 MG/2ML IJ SOLN: 4.0000 mg | Freq: Once | INTRAMUSCULAR | Status: DC | PRN

## 2019-09-26 MED ORDER — ALUM & MAG HYDROXIDE-SIMETH 200-200-20 MG/5ML PO SUSP: 30.0000 mL | ORAL | Status: DC | PRN

## 2019-09-26 MED ORDER — MIDAZOLAM HCL 5 MG/5ML IJ SOLN
INTRAMUSCULAR | Status: DC | PRN
  Administered 2019-09-26 (×2): 1 mg via INTRAVENOUS

## 2019-09-26 MED ORDER — SODIUM CHLORIDE 0.9 % IV SOLN: INTRAVENOUS | Status: DC

## 2019-09-26 MED ORDER — FENTANYL CITRATE (PF) 100 MCG/2ML IJ SOLN
INTRAMUSCULAR | Status: DC | PRN
  Administered 2019-09-26 (×2): 50 ug via INTRAVENOUS

## 2019-09-26 MED ORDER — DOCUSATE SODIUM 100 MG PO CAPS: 100.0000 mg | ORAL_CAPSULE | Freq: Two times a day (BID) | ORAL | 0 refills | Status: DC

## 2019-09-26 MED ORDER — LACTATED RINGERS IV BOLUS: 250.0000 mL | Freq: Once | INTRAVENOUS | Status: DC

## 2019-09-26 MED ORDER — PHENYLEPHRINE HCL (PRESSORS) 10 MG/ML IV SOLN
INTRAVENOUS | Status: AC
  Filled 2019-09-26: qty 1

## 2019-09-26 MED ORDER — ONDANSETRON HCL 4 MG/2ML IJ SOLN: 4.0000 mg | Freq: Four times a day (QID) | INTRAMUSCULAR | Status: DC | PRN

## 2019-09-26 MED ORDER — POLYETHYLENE GLYCOL 3350 17 G PO PACK: 17.0000 g | PACK | Freq: Every day | ORAL | Status: DC | PRN

## 2019-09-26 MED ORDER — OXYCODONE HCL 5 MG PO TABS
ORAL_TABLET | ORAL | Status: AC
  Administered 2019-09-26: 5 mg via ORAL
  Filled 2019-09-26: qty 1

## 2019-09-26 MED ORDER — MEPIVACAINE HCL (PF) 2 % IJ SOLN
INTRAMUSCULAR | Status: AC
  Filled 2019-09-26: qty 20

## 2019-09-26 MED ORDER — HYDROMORPHONE HCL 1 MG/ML IJ SOLN: 0.5000 mg | INTRAMUSCULAR | Status: DC | PRN

## 2019-09-26 MED ORDER — CHLORHEXIDINE GLUCONATE 4 % EX LIQD: 60.0000 mL | Freq: Once | CUTANEOUS | Status: DC

## 2019-09-26 MED ORDER — TIZANIDINE HCL 2 MG PO TABS: 2.0000 mg | ORAL_TABLET | Freq: Three times a day (TID) | ORAL | 0 refills | Status: DC | PRN

## 2019-09-26 MED ORDER — PROPOFOL 500 MG/50ML IV EMUL
INTRAVENOUS | Status: DC | PRN
  Administered 2019-09-26: 75 ug/kg/min via INTRAVENOUS

## 2019-09-26 MED ORDER — METHOCARBAMOL 500 MG PO TABS
500.0000 mg | ORAL_TABLET | Freq: Four times a day (QID) | ORAL | Status: DC | PRN
  Administered 2019-09-26 – 2019-09-27 (×2): 500 mg via ORAL
  Filled 2019-09-26 (×2): qty 1

## 2019-09-26 MED ORDER — PROPOFOL 10 MG/ML IV BOLUS
INTRAVENOUS | Status: DC | PRN
  Administered 2019-09-26: 20 mg via INTRAVENOUS
  Administered 2019-09-26: 30 mg via INTRAVENOUS

## 2019-09-26 MED ORDER — DOCUSATE SODIUM 100 MG PO CAPS
100.0000 mg | ORAL_CAPSULE | Freq: Two times a day (BID) | ORAL | Status: DC
  Administered 2019-09-26 – 2019-09-27 (×2): 100 mg via ORAL
  Filled 2019-09-26 (×2): qty 1

## 2019-09-26 MED ORDER — BUPIVACAINE LIPOSOME 1.3 % IJ SUSP
INTRAMUSCULAR | Status: DC | PRN
  Administered 2019-09-26: 20 mL

## 2019-09-26 MED ORDER — ALBUMIN HUMAN 5 % IV SOLN
INTRAVENOUS | Status: AC
  Filled 2019-09-26: qty 250

## 2019-09-26 MED ORDER — PROPOFOL 500 MG/50ML IV EMUL
INTRAVENOUS | Status: AC
  Filled 2019-09-26: qty 100

## 2019-09-26 MED ORDER — DEXAMETHASONE SODIUM PHOSPHATE 10 MG/ML IJ SOLN
INTRAMUSCULAR | Status: DC | PRN
  Administered 2019-09-26: 8 mg via INTRAVENOUS

## 2019-09-26 MED ORDER — MEPERIDINE HCL 50 MG/ML IJ SOLN: 6.2500 mg | INTRAMUSCULAR | Status: DC | PRN

## 2019-09-26 MED ORDER — 0.9 % SODIUM CHLORIDE (POUR BTL) OPTIME
TOPICAL | Status: DC | PRN
  Administered 2019-09-26: 1000 mL

## 2019-09-26 MED ORDER — SODIUM CHLORIDE 0.9 % IR SOLN
Status: DC | PRN
  Administered 2019-09-26: 1000 mL

## 2019-09-26 MED ORDER — MIDAZOLAM HCL 2 MG/2ML IJ SOLN
INTRAMUSCULAR | Status: AC
  Filled 2019-09-26: qty 2

## 2019-09-26 MED ORDER — CELECOXIB 200 MG PO CAPS
200.0000 mg | ORAL_CAPSULE | Freq: Two times a day (BID) | ORAL | Status: DC
  Administered 2019-09-26 – 2019-09-27 (×2): 200 mg via ORAL
  Filled 2019-09-26 (×2): qty 1

## 2019-09-26 MED ORDER — ACETAMINOPHEN 325 MG PO TABS: 325.0000 mg | ORAL_TABLET | Freq: Four times a day (QID) | ORAL | Status: DC | PRN

## 2019-09-26 MED ORDER — PHENYLEPHRINE 40 MCG/ML (10ML) SYRINGE FOR IV PUSH (FOR BLOOD PRESSURE SUPPORT)
PREFILLED_SYRINGE | INTRAVENOUS | Status: AC
  Filled 2019-09-26: qty 20

## 2019-09-26 MED ORDER — ONDANSETRON HCL 4 MG PO TABS: 4.0000 mg | ORAL_TABLET | Freq: Four times a day (QID) | ORAL | Status: DC | PRN

## 2019-09-26 MED ORDER — CEFAZOLIN SODIUM-DEXTROSE 2-4 GM/100ML-% IV SOLN
2.0000 g | INTRAVENOUS | Status: AC
  Administered 2019-09-26: 2 g via INTRAVENOUS
  Filled 2019-09-26: qty 100

## 2019-09-26 MED ORDER — GABAPENTIN 300 MG PO CAPS
300.0000 mg | ORAL_CAPSULE | Freq: Two times a day (BID) | ORAL | Status: DC
  Administered 2019-09-26 – 2019-09-27 (×2): 300 mg via ORAL
  Filled 2019-09-26 (×2): qty 1

## 2019-09-26 SURGICAL SUPPLY — 58 items
ATTUNE MED DOME PAT 41 KNEE (Knees) ×2 IMPLANT
ATTUNE PS FEM RT SZ 7 CEM KNEE (Femur) ×2 IMPLANT
ATTUNE PSRP INSR SZ7 8 KNEE (Insert) ×2 IMPLANT
BAG ZIPLOCK 12X15 (MISCELLANEOUS) ×2 IMPLANT
BASE TIBIAL ROT PLAT SZ 7 KNEE (Knees) ×1 IMPLANT
BENZOIN TINCTURE PRP APPL 2/3 (GAUZE/BANDAGES/DRESSINGS) ×2 IMPLANT
BLADE SAGITTAL 25.0X1.19X90 (BLADE) ×2 IMPLANT
BLADE SAW SGTL 13.0X1.19X90.0M (BLADE) ×2 IMPLANT
BLADE SURG SZ10 CARB STEEL (BLADE) ×4 IMPLANT
BNDG ELASTIC 6X5.8 VLCR STR LF (GAUZE/BANDAGES/DRESSINGS) ×2 IMPLANT
BOOTIES KNEE HIGH SLOAN (MISCELLANEOUS) ×2 IMPLANT
BOWL SMART MIX CTS (DISPOSABLE) ×2 IMPLANT
CEMENT HV SMART SET (Cement) ×4 IMPLANT
CLSR STERI-STRIP ANTIMIC 1/2X4 (GAUZE/BANDAGES/DRESSINGS) ×2 IMPLANT
COVER SURGICAL LIGHT HANDLE (MISCELLANEOUS) ×2 IMPLANT
COVER WAND RF STERILE (DRAPES) IMPLANT
CUFF TOURN SGL QUICK 34 (TOURNIQUET CUFF) ×1
CUFF TRNQT CYL 34X4.125X (TOURNIQUET CUFF) ×1 IMPLANT
DECANTER SPIKE VIAL GLASS SM (MISCELLANEOUS) ×4 IMPLANT
DRAPE U-SHAPE 47X51 STRL (DRAPES) ×2 IMPLANT
DRESSING AQUACEL AG SP 3.5X10 (GAUZE/BANDAGES/DRESSINGS) ×1 IMPLANT
DRSG AQUACEL AG ADV 3.5X10 (GAUZE/BANDAGES/DRESSINGS) ×2 IMPLANT
DRSG AQUACEL AG SP 3.5X10 (GAUZE/BANDAGES/DRESSINGS) ×2
DURAPREP 26ML APPLICATOR (WOUND CARE) ×2 IMPLANT
ELECT REM PT RETURN 15FT ADLT (MISCELLANEOUS) ×2 IMPLANT
GLOVE BIOGEL PI IND STRL 8 (GLOVE) ×2 IMPLANT
GLOVE BIOGEL PI INDICATOR 8 (GLOVE) ×2
GLOVE ECLIPSE 7.5 STRL STRAW (GLOVE) ×4 IMPLANT
GOWN STRL REUS W/TWL XL LVL3 (GOWN DISPOSABLE) ×4 IMPLANT
HANDPIECE INTERPULSE COAX TIP (DISPOSABLE) ×1
HOLDER FOLEY CATH W/STRAP (MISCELLANEOUS) IMPLANT
HOOD PEEL AWAY FLYTE STAYCOOL (MISCELLANEOUS) ×6 IMPLANT
KIT TURNOVER KIT A (KITS) IMPLANT
MANIFOLD NEPTUNE II (INSTRUMENTS) ×2 IMPLANT
NEEDLE HYPO 22GX1.5 SAFETY (NEEDLE) ×2 IMPLANT
NS IRRIG 1000ML POUR BTL (IV SOLUTION) ×2 IMPLANT
PACK ICE MAXI GEL EZY WRAP (MISCELLANEOUS) ×2 IMPLANT
PACK TOTAL KNEE CUSTOM (KITS) ×2 IMPLANT
PADDING CAST COTTON 6X4 STRL (CAST SUPPLIES) ×2 IMPLANT
PENCIL SMOKE EVACUATOR (MISCELLANEOUS) IMPLANT
PIN DRILL FIX HALF THREAD (BIT) ×2 IMPLANT
PIN STEINMAN FIXATION KNEE (PIN) ×2 IMPLANT
PROTECTOR NERVE ULNAR (MISCELLANEOUS) ×2 IMPLANT
SET HNDPC FAN SPRY TIP SCT (DISPOSABLE) ×1 IMPLANT
STRIP CLOSURE SKIN 1/2X4 (GAUZE/BANDAGES/DRESSINGS) ×2 IMPLANT
SUT FIBERWIRE #2 38 REV NDL BL (SUTURE) ×4
SUT MNCRL AB 3-0 PS2 18 (SUTURE) ×2 IMPLANT
SUT VIC AB 0 CT1 36 (SUTURE) ×2 IMPLANT
SUT VIC AB 1 CT1 36 (SUTURE) ×6 IMPLANT
SUT VIC AB 2-0 CT1 27 (SUTURE) ×1
SUT VIC AB 2-0 CT1 TAPERPNT 27 (SUTURE) ×1 IMPLANT
SUTURE FIBERWR#2 38 REV NDL BL (SUTURE) ×2 IMPLANT
SYR CONTROL 10ML LL (SYRINGE) ×4 IMPLANT
TIBIAL BASE ROT PLAT SZ 7 KNEE (Knees) ×2 IMPLANT
TRAY FOLEY MTR SLVR 16FR STAT (SET/KITS/TRAYS/PACK) ×2 IMPLANT
WATER STERILE IRR 1000ML POUR (IV SOLUTION) ×4 IMPLANT
WRAP KNEE MAXI GEL POST OP (GAUZE/BANDAGES/DRESSINGS) ×2 IMPLANT
YANKAUER SUCT BULB TIP 10FT TU (MISCELLANEOUS) ×2 IMPLANT

## 2019-09-26 NOTE — H&P (Signed)
TOTAL KNEE ADMISSION H&P  Patient is being admitted for right total knee arthroplasty.  Subjective:  Chief Complaint:right knee pain.  HPI: Edward [name], 69 y.o. male, has a history of pain and functional disability in the right knee due to arthritis and has failed non-surgical conservative treatments for greater than 12 weeks to includeNSAID's and/or analgesics and flexibility and strengthening excercises.  Onset of symptoms was gradual, starting 8 years ago with gradually worsening course since that time. The patient noted prior procedures on the knee to include  Patellar tendon repair on the right knee(s).  Patient currently rates pain in the right knee(s) at 9 out of 10 with activity. Patient has night pain, worsening of pain with activity and weight bearing, pain that interferes with activities of daily living, pain with passive range of motion and joint swelling.  Patient has evidence of subchondral cysts, subchondral sclerosis and joint space narrowing by imaging studies. This patient has had Failure of all reasonable conservative care. There is no active infection.  Patient Active Problem List   Diagnosis Date Noted  . Leg edema 07/08/2019  . Genetic testing 06/11/2018  . Family history of breast cancer   . Family history of oral cancer   . Prostate cancer ([place]) 02/14/2018  . Cervical myelopathy ([place]) 03/18/2012  . SCI (spinal cord injury) 03/18/2012  . HTN (hypertension) 03/18/2012  . Nasal bone fracture 03/11/2012  . CAD (coronary artery disease) 03/11/2012   Past Medical History:  Diagnosis Date  . Chicken pox   . Coronary artery disease    a. 1999 s/p MI with cath/PCI;  b. 05/1999 Ex Cardiolite EF 68%, no ishcemia.  . Dyslipidemia   . Family history of breast cancer   . Family history of oral cancer   . History of tobacco abuse   . Hypertension   . Hypokalemia   . Injury of cervical spine ([place])    a. 02/2012 C4/5  . Measles   . Mumps   . Myocardial infarction  ([place])   . Nasal bones, closed fracture    a. 02/2012 in setting of presyncope/fall  . Near syncope   . Pneumonia   . Prostate cancer ([place])   . Whooping cough     Past Surgical History:  Procedure Laterality Date  . [place]  . ANTERIOR CERVICAL DECOMP/DISCECTOMY FUSION  03/13/2012   Procedure: ANTERIOR CERVICAL DECOMPRESSION/DISCECTOMY FUSION 1 LEVEL;  Surgeon: Erline Levine, MD;  Location: [facility];  Service: Neurosurgery;  Laterality: N/A;  Anterior Cervical Decompression/Fusion. Cervical four-five.  Marland Kitchen CARDIAC CATHETERIZATION  06/28/1998   single vessel CAD involving the distal RCA/PTCA and stenting of the distal RCA//EF- 50-55%  . CLOSED REDUCTION NASAL FRACTURE  03/13/2012   Procedure: CLOSED REDUCTION NASAL FRACTURE;  Surgeon: Theodoro Kos, DO;  Location: [facility];  Service: Plastics;  Laterality: N/A;  Internal and external splinting of nasal fracture  . LYMPHADENECTOMY Bilateral 02/14/2018   Procedure: [facility];  Surgeon: Raynelle Bring, MD;  [facility];  Service: Urology;  Laterality: Bilateral;  . NM MYOVIEW LTD  05/17/2009   normal stress nuclear study/no evidence of ischemia/EF- 68%  . petalla tendon surgery  1989  . ROBOT ASSISTED LAPAROSCOPIC RADICAL PROSTATECTOMY N/A 02/14/2018   Procedure: XI ROBOTIC ASSISTED LAPAROSCOPIC RADICAL PROSTATECTOMY;  Surgeon: Raynelle Bring, MD;  [facility];  Service: Urology;  Laterality: N/A;    Current Facility-Administered Medications  Medication Dose Route Frequency Provider Last Rate Last Admin  . bupivacaine liposome (  EXPAREL) 1.3 % injection 266 mg  20 mL Other On Call to Hood, Cindie Laroche, RPH      . ceFAZolin (ANCEF) IVPB 2g/100 mL premix  2 g Intravenous On Call to OR Dorna Leitz, MD      . chlorhexidine (HIBICLENS) 4 % liquid 4 application  60 mL Topical Once Dorna Leitz, MD      . lactated ringers infusion   Intravenous Continuous Lyn Hollingshead, MD 100 mL/hr at 09/26/19  0602 New Bag at 09/26/19 0602  . povidone-iodine 10 % swab 2 application  2 application Topical Once Dorna Leitz, MD      . tranexamic acid (CYKLOKAPRON) IVPB 1,000 mg  1,000 mg Intravenous To OR Dorna Leitz, MD       Facility-Administered Medications Ordered in Other Encounters  Medication Dose Route Frequency Provider Last Rate Last Admin  . fentaNYL (SUBLIMAZE) injection   Intravenous Anesthesia Intra-op West Pugh, CRNA   50 mcg at 09/26/19 0701  . midazolam (VERSED) 5 MG/5ML injection   Intravenous Anesthesia Intra-op West Pugh, CRNA   1 mg at 09/26/19 0700   No Known Allergies  Social History   Tobacco Use  . Smoking status: Former Smoker    Packs/day: 1.00    Years: 20.00    Pack years: 20.00    Types: Cigarettes    Quit date: 11/01/1988    Years since quitting: 30.9  . Smokeless tobacco: Never Used  Substance Use Topics  . Alcohol use: Yes    Comment: occasional alcoholic beverage.    Family History  Problem Relation Age of Onset  . Heart attack Father        34  . Breast cancer Mother        dx late 65s, d. 20  . Cancer Sister        oral cancer dx 24  . Breast cancer Daughter        dx. 75, currently being treated     Review of Systems ROS: I have reviewed the patient's review of systems thoroughly and there are no positive responses as relates to the HPI. Objective:  Physical Exam  Vital signs in last 24 hours: Temp:  [98.7 F (37.1 C)] 98.7 F (37.1 C) (02/26 0541) Pulse Rate:  [75] 75 (02/26 0541) Resp:  [17] 17 (02/26 0541) BP: (122)/(83) 122/83 (02/26 0541) SpO2:  [100 %] 100 % (02/26 0541) Weight:  [93.6 kg] 93.6 kg (02/26 0541) Well-developed well-nourished patient in no acute distress. Alert and oriented x3 HEENT:within normal limits Cardiac: Regular rate and rhythm Pulmonary: Lungs clear to auscultation Abdomen: Soft and nontender.  Normal active bowel sounds  Musculoskeletal: (Right leg: Painful range of motion.  Limited range  of motion.  No instability.  Trace effusion. Labs: Recent Results (from the past 2160 hour(s))  Surgical pcr screen     Status: None   Collection Time: 09/19/19 11:48 AM   Specimen: Nasal Mucosa; Nasal Swab  Result Value Ref Range   MRSA, PCR NEGATIVE NEGATIVE   Staphylococcus aureus NEGATIVE NEGATIVE    Comment: (NOTE) The Xpert SA Assay (FDA approved for NASAL specimens in patients 56 years of age and older), is one component of a comprehensive surveillance program. It is not intended to diagnose infection nor to guide or monitor treatment. Performed at [facility], [place] [address] [place], [place] [address]   APTT     Status: None   Collection Time: 09/19/19 11:48 AM  Result  Value Ref Range   aPTT 32 24 - 36 seconds    Comment: Performed at [facility], [place] [address], [place], [place] [address]  CBC WITH DIFFERENTIAL     Status: Abnormal   Collection Time: 09/19/19 11:48 AM  Result Value Ref Range   WBC 3.3 (L) 4.0 - 10.5 K/uL   RBC 4.04 (L) 4.22 - 5.81 MIL/uL   Hemoglobin 13.1 13.0 - 17.0 g/dL   HCT 38.2 (L) 39.0 - 52.0 %   MCV 94.6 80.0 - 100.0 fL   MCH 32.4 26.0 - 34.0 pg   MCHC 34.3 30.0 - 36.0 g/dL   RDW 12.6 11.5 - 15.5 %   Platelets 251 150 - 400 K/uL   nRBC 0.0 0.0 - 0.2 %   Neutrophils Relative % 43 %   Neutro Abs 1.4 (L) 1.7 - 7.7 K/uL   Lymphocytes Relative 36 %   Lymphs Abs 1.2 0.7 - 4.0 K/uL   Monocytes Relative 17 %   Monocytes Absolute 0.6 0.1 - 1.0 K/uL   Eosinophils Relative 3 %   Eosinophils Absolute 0.1 0.0 - 0.5 K/uL   Basophils Relative 1 %   Basophils Absolute 0.0 0.0 - 0.1 K/uL   Immature Granulocytes 0 %   Abs Immature Granulocytes 0.01 0.00 - 0.07 K/uL    Comment: Performed at [facility], [place] [address], [place], [place] [address]  Comprehensive metabolic panel     Status: Abnormal   Collection Time: 09/19/19 11:48 AM  Result Value Ref Range   Sodium 139 135 - 145 mmol/L    Potassium 3.4 (L) 3.5 - 5.1 mmol/L   Chloride 107 98 - 111 mmol/L   CO2 23 22 - 32 mmol/L   Glucose, Bld 97 70 - 99 mg/dL   BUN 18 8 - 23 mg/dL   Creatinine, Ser 0.95 0.61 - 1.24 mg/dL   Calcium 8.9 8.9 - 10.3 mg/dL   Total Protein 6.7 6.5 - 8.1 g/dL   Albumin 4.0 3.5 - 5.0 g/dL   AST 20 15 - 41 U/L   ALT 18 0 - 44 U/L   Alkaline Phosphatase 67 38 - 126 U/L   Total Bilirubin 0.8 0.3 - 1.2 mg/dL   GFR calc non Af Amer >60 >60 mL/min   GFR calc Af Amer >60 >60 mL/min   Anion gap 9 5 - 15    Comment: Performed at [facility], [place] [address], [place], [place] [address]  Protime-INR     Status: None   Collection Time: 09/19/19 11:48 AM  Result Value Ref Range   Prothrombin Time 13.6 11.4 - 15.2 seconds   INR 1.1 0.8 - 1.2    Comment: (NOTE) INR goal varies based on device and disease states. Performed at [facility], [place] [address] [place], [place] [address]   Type and screen Order type and screen if day of surgery is less than 15 days from draw of preadmission visit or order morning of surgery if day of surgery is greater than 6 days from preadmission visit.     Status: None   Collection Time: 09/19/19 11:48 AM  Result Value Ref Range   ABO/RH(D) B POS    Antibody Screen NEG    Sample Expiration 09/29/2019,2359    Extend sample reason      NO TRANSFUSIONS OR PREGNANCY IN THE PAST 3 MONTHS Performed at [facility], [place] [address] [place], [place] [address]  Urinalysis, Routine w reflex microscopic     Status: None   Collection Time: 09/19/19 11:48 AM  Result Value Ref Range   Color, Urine YELLOW YELLOW   APPearance CLEAR CLEAR   Specific Gravity, Urine 1.020 1.005 - 1.030   pH 6.0 5.0 - 8.0   Glucose, UA NEGATIVE NEGATIVE mg/dL   Hgb urine dipstick NEGATIVE NEGATIVE   Bilirubin Urine NEGATIVE NEGATIVE   Ketones, ur NEGATIVE NEGATIVE mg/dL   Protein, ur NEGATIVE NEGATIVE mg/dL   Nitrite NEGATIVE NEGATIVE    Leukocytes,Ua NEGATIVE NEGATIVE    Comment: Performed at [place] [address], [place], [place] [address]  SARS CORONAVIRUS 2 (TAT 6-24 HRS) Nasopharyngeal Nasopharyngeal Swab     Status: None   Collection Time: 09/23/19  1:37 PM   Specimen: Nasopharyngeal Swab  Result Value Ref Range   SARS Coronavirus 2 NEGATIVE NEGATIVE    Comment: (NOTE) SARS-CoV-2 target nucleic acids are NOT DETECTED. The SARS-CoV-2 RNA is generally detectable in upper and lower respiratory specimens during the acute phase of infection. Negative results do not preclude SARS-CoV-2 infection, do not rule out co-infections with other pathogens, and should not be used as the sole basis for treatment or other patient management decisions. Negative results must be combined with clinical observations, patient history, and epidemiological information. The expected result is Negative. Fact Sheet for Patients: SugarRoll.be Fact Sheet for Healthcare Providers: https://www.woods-mathews.com/ This test is not yet approved or cleared by the [place] FDA and  has been authorized for detection and/or diagnosis of SARS-CoV-2 by FDA under an Emergency Use Authorization (EUA). This EUA will remain  in effect (meaning this test can be used) for the duration of the COVID-19 declaration under Section 56 4(b)(1) of the Act, 21 U.S.C. section 360bbb-3(b)(1), unless the authorization is terminated or revoked sooner. Performed at [facility], [place] [address], [place], [place] [address]      Estimated body mass index is 27.99 kg/m as calculated from the following:   Height as of 09/19/19: 6' (1.829 m).   Weight as of this encounter: 93.6 kg.   Imaging Review Plain radiographs demonstrate severe degenerative joint disease of the right knee(s). The overall alignment ismild valgus. The bone quality appears to be fair for age and reported activity  level.      Assessment/Plan:  End stage arthritis, right knee   The patient history, physical examination, clinical judgment of the provider and imaging studies are consistent with end stage degenerative joint disease of the right knee(s) and total knee arthroplasty is deemed medically necessary. The treatment options including medical management, injection therapy arthroscopy and arthroplasty were discussed at length. The risks and benefits of total knee arthroplasty were presented and reviewed. The risks due to aseptic loosening, infection, stiffness, patella tracking problems, thromboembolic complications and other imponderables were discussed. The patient acknowledged the explanation, agreed to proceed with the plan and consent was signed. Patient is being admitted for inpatient treatment for surgery, pain control, PT, OT, prophylactic antibiotics, VTE prophylaxis, progressive ambulation and ADL's and discharge planning. The patient is planning to be discharged home with home health services     Patient's anticipated LOS is less than 2 midnights, meeting these requirements: - Younger than 63 - Lives within 1 hour of care - Has a competent adult at home to recover with post-op recover - NO history of  - Chronic pain requiring opiods  - Diabetes  - Coronary Artery Disease  - Heart failure  -  Heart attack  - Stroke  - DVT/VTE  - Cardiac arrhythmia  - Respiratory Failure/COPD  - Renal failure  - Anemia  - Advanced Liver disease

## 2019-09-26 NOTE — Anesthesia Postprocedure Evaluation (Signed)
Anesthesia Post Note  Patient: DORAIN CICOTTE  Procedure(s) Performed: TOTAL KNEE ARTHROPLASTY (Right Knee)     Patient location during evaluation: Phase II Anesthesia Type: Spinal Level of consciousness: awake Pain management: pain level controlled Vital Signs Assessment: post-procedure vital signs reviewed and stable Respiratory status: spontaneous breathing Cardiovascular status: stable Postop Assessment: patient able to bend at knees, no apparent nausea or vomiting, no headache, no backache and spinal receding Anesthetic complications: no    Last Vitals:  Vitals:   09/26/19 1015 09/26/19 1030  BP: 116/83 122/89  Pulse: 95 89  Resp: 19 15  Temp:  36.4 C  SpO2: 98% 100%    Last Pain:  Vitals:   09/26/19 1030  TempSrc:   PainSc: 5    Pain Goal:    LLE Motor Response: Purposeful movement (09/26/19 1015)   RLE Motor Response: Purposeful movement (09/26/19 1015)   L Sensory Level: S4-Perianal area (09/26/19 1015) R Sensory Level: S4-Perianal area (09/26/19 1015)    Huston Foley

## 2019-09-26 NOTE — Op Note (Signed)
PATIENT ID:      Edward [name]  MRN:     [id number] DOB/AGE:    09/24/1950 / 69 y.o.       OPERATIVE REPORT   DATE OF PROCEDURE:  09/26/2019      PREOPERATIVE DIAGNOSIS:   OSTEOARTHRITIS RIGHT KNEE      Estimated body mass index is 27.99 kg/m as calculated from the following:   Height as of 09/19/19: 6' (1.829 m).   Weight as of this encounter: 93.6 kg.                                                       POSTOPERATIVE DIAGNOSIS:   OSTEOARTHRITIS RIGHT KNEE                                                                       PROCEDURE:  Procedure(s): 1. TOTAL KNEE ARTHROPLASTY Using DepuyAttune RP implants #7 Femur, #7Tibia, 8 mm Attune RP bearing, 41 Patella  2. Patellar tendon repair  SURGEON: Alta Corning  ASSISTANT:   Gaspar Skeeters PA-C   (Present and scrubbed throughout the case, critical for assistance with exposure, retraction, instrumentation, and closure.)        ANESTHESIA: spinal, 20cc Exparel, 50cc 0.25% Marcaine EBL: min cc FLUID REPLACEMENT: unk cc crystaloid TOURNIQUET: DRAINS: None TRANEXAMIC ACID: 1gm IV, 2gm topical COMPLICATIONS:  None         INDICATIONS FOR PROCEDURE: The patient has  OSTEOARTHRITIS RIGHT KNEE, mild valgus deformities, XR shows bone on bone arthritis, lateral subluxation of tibia. Patient has failed all conservative measures including anti-inflammatory medicines, narcotics, attempts at exercise and weight loss, cortisone injections and viscosupplementation.  Risks and benefits of surgery have been discussed, questions answered.   DESCRIPTION OF PROCEDURE: The patient identified by armband, received  IV antibiotics, in the holding area at [facility]. Patient taken to the operating room, appropriate anesthetic monitors were attached, and spinal anesthesia was  induced. IV Tranexamic acid was given.Tourniquet applied high to the operative thigh. Lateral post and foot positioner applied to the table, the lower extremity was then prepped and  draped in usual sterile fashion from the toes to the tourniquet. Time-out procedure was performed. Kerry Hough. [facility] PAC, was present and scrubbed throughout the case, critical for assistance with, positioning, exposure, retraction, instrumentation, and closure.The skin and subcutaneous tissue along the incision was injected with 20 cc of a mixture of Exparel and Marcaine solution, using a 20-gauge by 1-1/2 inch needle. We began the operation, with the knee flexed 130 degrees, by making the anterior midline incision starting at handbreadth above the patella going over the patella 1 cm medial to and 4 cm distal to the tibial tubercle. Small bleeders in the skin and the subcutaneous tissue identified and cauterized. Transverse retinaculum was incised and reflected medially and a medial parapatellar arthrotomy was accomplished. the patella was everted and theprepatellar fat pad resected. The superficial medial collateral ligament was then elevated from anterior to posterior along the proximal flare of the tibia and anterior half of the menisci resected. The knee  was hyperflexed exposing bone on bone arthritis. Peripheral and notch osteophytes as well as the cruciate ligaments were then resected. We continued to work our way around posteriorly along the proximal tibia, and externally rotated the tibia subluxing it out from underneath the femur. A McHale PCL retractor was placed through the notch and a lateral Hohmann retractor placed, and we then entered the proximal tibia in line with the Depuy starter drill in line with the axis of the tibia followed by an intramedullary guide rod and 0-degree posterior slope cutting guide. The tibial cutting guide, 3 degree posterior sloped, was pinned into place allowing resection of 2 mm of bone medially and 10 mm of bone laterally. Satisfied with the tibial resection, we then entered the distal femur 2 mm anterior to the PCL origin with the intramedullary guide rod and applied the  distal femoral cutting guide set at 10 mm, with 4 degrees of valgus. This was pinned along the epicondylar axis. At this point, the distal femoral cut was accomplished without difficulty. We then sized for a #7 femoral component and pinned the guide in 3 degrees of external rotation set to match the epicondylar axis. The chamfer cutting guide was pinned into place. The anterior, posterior, and chamfer cuts were accomplished without difficulty followed by the Attune RP box cutting guide and the box cut. We also removed posterior osteophytes from the posterior femoral condyles. The posterior capsule was injected with Exparel solution. The knee was brought into full extension. We checked our extension gap and fit a 8 mm bearing. Distracting in extension with a lamina spreader,  bleeders in the posterior capsule, Posterior medial and posterior lateral gutter were cauterized.  The transexamic acid-soaked sponge was then placed in the gap of the knee in extension. The knee was flexed 30. The posterior patella cut was accomplished with the 9.5 mm Attune cutting guide, sized for a 18mm dome, and the fixation pegs drilled.The knee was then once again hyperflexed exposing the proximal tibia. We sized for a # 7 tibial base plate, applied the smokestack and the conical reamer followed by the the Delta fin keel punch. We then hammered into place the Attune RP trial femoral component, drilled the lugs, inserted a  8 mm trial bearing, trial patellar button, and took the knee through range of motion from 0-130 degrees. Medial and lateral ligamentous stability was checked. No thumb pressure was required for patellar Tracking. The tourniquet was released. All trial components were removed, mating surfaces irrigated with pulse lavage, and dried with suction and sponges. 10 cc of the Exparel solution was applied to the cancellus bone of the patella distal femur and proximal tibia.  After waiting 30 seconds, the bony surfaces were  again, dried with sponges. A double batch of DePuy HV cement was mixed and applied to all bony metallic mating surfaces except for the posterior condyles of the femur itself. In order, we hammered into place the tibial tray and removed excess cement, the femoral component and removed excess cement. The final Attune RP bearing was inserted, and the knee brought to full extension with compression. The patellar button was clamped into place, and excess cement removed. The knee was held at 30 flexion with compression, while the cement cured. The wound was irrigated out with normal saline solution pulse lavage. The rest of the Exparel was injected into the parapatellar arthrotomy, subcutaneous tissues, and periosteal tissues.  At this point attention was turned to the patellar tendon.  The patient was noted  to have patella alto preoperatively.  We took the patellar tendon and considered imbrication.  I took FiberWire sutures and passed them in a horizontal mattress fashion through the patellar tendon.  We essentially shorten the patellar tendon by 2 cm with the bump being on the internal surface.  I clamped these and put the knee through range of motion.  He was still able to come into easy excellent flexion at about 130 degrees.  Having checked this I felt that it was reasonable to tie the FiberWire sutures.  In the #2 sutures x3 were tied imbricating the patellar tendon and essentially patellar tendon repair.  I then took a #1 Vicryl suture and oversewed the imbrication.  We then put the knee through a range of motion and the patellar button was in far better position in flexion.  The patella button did not move anywhere near off of the femoral component in extension.  The parapatellar arthrotomy was closed with running #1 Vicryl suture. The subcutaneous tissue with 0 and 2-0 undyed Vicryl suture, and the skin with running 3-0 SQ vicryl. An Aquacil and Ace wrap were applied. The patient was taken to recovery room  without difficulty.   Alta Corning 09/26/2019, 9:22 AM

## 2019-09-26 NOTE — Transfer of Care (Signed)
Immediate Anesthesia Transfer of Care Note  Patient: Edward [name]  Procedure(s) Performed: TOTAL KNEE ARTHROPLASTY (Right Knee)  Patient Location: PACU  Anesthesia Type:Spinal and MAC combined with regional for post-op pain  Level of Consciousness: awake, patient cooperative  Airway & Oxygen Therapy: Patient Spontanous Breathing and Patient connected to face mask oxygen  Post-op Assessment: Report given to RN and Post -op Vital signs reviewed and stable  Post vital signs: Reviewed and stable  Last Vitals:  Vitals Value Taken Time  BP 105/73 09/26/19 0945  Temp    Pulse 95 09/26/19 0945  Resp 21 09/26/19 0945  SpO2 100 % 09/26/19 0945  Vitals shown include unvalidated device data.  Last Pain:  Vitals:   09/26/19 0550  TempSrc:   PainSc: 0-No pain         Complications: No apparent anesthesia complications

## 2019-09-26 NOTE — Discharge Instructions (Signed)

## 2019-09-26 NOTE — Care Management Obs Status (Signed)
MEDICARE OBSERVATION STATUS NOTIFICATION   Patient Details  Name: Edward [name] MRN: [id number] Date of Birth: [birth year]   Medicare Observation Status Notification Given:  Yes    Erenest Rasher, RN 09/26/2019, 5:15 PM

## 2019-09-26 NOTE — Care Management CC44 (Signed)
Condition Code 44 Documentation Completed  Patient Details  Name: Edward [name] MRN: [id number] Date of Birth: [birth year]   Condition Code 44 given:  Yes Patient signature on Condition Code 44 notice:  Yes Documentation of 2 MD's agreement:  Yes Code 44 added to claim:  Yes    Erenest Rasher, RN 09/26/2019, 5:15 PM

## 2019-09-26 NOTE — Progress Notes (Signed)
Physical Therapy Treatment Patient Details Name: Edward [name] MRN: [id number] DOB: [birth year] Today's Date: 09/26/2019    History of Present Illness Patient is 69 y.o. male s/p Rt TKA on 09/26/19 with PMH significant for prostate cancer, MI, HTN, CAD, ACDF, and achilles surgery.    PT Comments    ALEXANER [name] is a 69 y.o. male POD 0 s/p Rt TKA. Patient seen for additional session to progress mobility with goals of SDDC. Pt was bale to complete bed mobility, transfer and gait at supervision level. Gait with RW remained very slow and pt required knee immobilizer to prevent Rt LE buckling. After ambulation pt reported lightheadedness and weak feeling, he was found to be hypotensive with BP of 90/64 (HR of 83). After 8 minutes supine rest BP recovered to 100/78 (HRof 79). Pt is not safe to discharge home at this time. Patient will benefit from continued skilled PT interventions to address impairments and progress towards PLOF. Acute PT will follow to progress mobility and stair training in preparation for safe discharge home.    Follow Up Recommendations  Follow surgeon's recommendation for DC plan and follow-up therapies     Equipment Recommendations  Rolling walker with 5" wheels    Recommendations for Other Services       Precautions / Restrictions Precautions Precautions: Fall Restrictions Weight Bearing Restrictions: No    Mobility  Bed Mobility Overal bed mobility: Needs Assistance Bed Mobility: Supine to Sit;Sit to Supine     Supine to sit: Supervision Sit to supine: Min assist   General bed mobility comments: pt educated on use of gait belt to assist with Rt LE mobility, no assist required, pt able to raise trunk from flat bed. min assist required to raise Rt LE into bed due to fatigue and lightheadedness.  Transfers Overall transfer level: Needs assistance Equipment used: Rolling walker (2 wheeled) Transfers: Sit to/from [facility] Sit to  Stand: Min guard;Supervision Stand pivot transfers: Min guard       General transfer comment: pt performed 2x from EOB and 1x from wheelchair. pt required cues for safe hand placement to initiate power up and rise to RW. min guard for safet on first time. pt required min guard for safety with stand pivot due to low BP, no assist required.  Ambulation/Gait Ambulation/Gait assistance: Min guard;Supervision Gait Distance (Feet): 80 Feet Assistive device: Rolling walker (2 wheeled) Gait Pattern/deviations: Step-to pattern;Decreased step length - right;Decreased step length - left;Decreased stance time - right;Decreased stride length;Decreased weight shift to right;Trunk flexed Gait velocity: slow   General Gait Details: pt took ~ 4 steps forward and backward with noted Rt knee buckling. Pt returned to bed for exercises and knee immobilizer donned for gait. pt required cues for safe step pattern/sequencing with RW at start and initially sliding Lt foot to avoid WB on Rt LE. pt improved step patter and maintained close proximity to RW throughout, cadence very slow. At eng of gait pt reported feeling lightheaded and very tired. Seated rest provided and BP noted to be 90/64 (HR=83).   Stairs          Wheelchair Mobility    Modified Rankin (Stroke Patients Only)       Balance Overall balance assessment: Needs assistance   Sitting balance-Leahy Scale: Good     Standing balance support: During functional activity;Bilateral upper extremity supported Standing balance-Leahy Scale: Poor         Cognition Arousal/Alertness: Awake/alert Behavior During Therapy: WFL for tasks assessed/performed  Overall Cognitive Status: Within Functional Limits for tasks assessed           Exercises Total Joint Exercises Ankle Circles/Pumps: (P) AROM;10 reps;Supine;Both Quad Sets: (P) AROM;Supine;5 reps;Right Short Arc Quad: (P) AROM;5 reps;Supine;Right Heel Slides: (P) 5 reps;Supine;Right Hip  ABduction/ADduction: (P) AROM;5 reps;Supine;Right Straight Leg Raises: (P) AROM;5 reps;Supine;Right Long Arc Quad: (P) AROM;5 reps;Seated;Right Knee Flexion: (P) AROM;AAROM;5 reps;Seated;Right    General Comments        Pertinent Vitals/Pain Pain Assessment: 0-10 Pain Score: 5  Pain Location: Rt knee Pain Descriptors / Indicators: Aching;Sore Pain Intervention(s): Limited activity within patient's tolerance;Monitored during session;Repositioned    Home Living Family/patient expects to be discharged to:: Private residence Living Arrangements: Spouse/significant other Available Help at Discharge: Available 24 hours/day Type of Home: House Home Access: Stairs to enter Entrance Stairs-Rails: Can reach both Home Layout: Two level;Able to live on main level with bedroom/bathroom Home Equipment: Kasandra Knudsen - single point      Prior Function Level of Independence: Independent          PT Goals (current goals can now be found in the care plan section) Acute Rehab PT Goals Patient Stated Goal: go home today PT Goal Formulation: With patient Time For Goal Achievement: 10/03/19 Potential to Achieve Goals: Good Progress towards PT goals: Progressing toward goals    Frequency    7X/week      PT Plan Current plan remains appropriate       AM-PAC PT "6 Clicks" Mobility   Outcome Measure  Help needed turning from your back to your side while in a flat bed without using bedrails?: None Help needed moving from lying on your back to sitting on the side of a flat bed without using bedrails?: A Little Help needed moving to and from a bed to a chair (including a wheelchair)?: A Little Help needed standing up from a chair using your arms (e.g., wheelchair or bedside chair)?: A Little Help needed to walk in hospital room?: A Little Help needed climbing 3-5 steps with a railing? : A Little 6 Click Score: 19    End of Session Equipment Utilized During Treatment: Gait belt Activity  Tolerance: Patient tolerated treatment well Patient left: in bed;with call bell/phone within reach Nurse Communication: Mobility status;Other (comment)(recommend pt eat a real lunch prior to next therapy session) PT Visit Diagnosis: Muscle weakness (generalized) (M62.81);Difficulty in walking, not elsewhere classified (R26.2)     Time: [id number] PT Time Calculation (min) (ACUTE ONLY): 52 min  Charges:  $Gait Training: 8-22 mins $Therapeutic Exercise: 8-22 mins $Therapeutic Activity: 8-22 mins                     Verner Mould, DPT Physical Therapist with [facility] [phone]  09/26/2019 3:06 PM

## 2019-09-26 NOTE — Evaluation (Addendum)
Physical Therapy Evaluation Patient Details Name: Edward [name] MRN: [id number] DOB: [birth year] Today's Date: 09/26/2019   History of Present Illness  Patient is 69 y.o. male s/p Rt TKA on 09/26/19 with PMH significant for prostate cancer, MI, HTN, CAD, ACDF, and achilles surgery.    Clinical Impression  Edward [name] is a 69 y.o. male POD 0 s/p Rt TKA. Patient reports independence with mobility at baseline. Patient is now limited by functional impairments (see PT problem list below) and requires min assist for bed mob. He was limited this session due to dizziness upon sitting at EOB. Symptoms did not resolve with time and pt returned to supine. Will follow up for additional session to progress mobility and review HEP as pt has a goal to discharge same day as surgery. Patient will benefit from continued skilled PT interventions to address impairments and progress towards PLOF. Acute PT will follow to progress mobility and stair training in preparation for safe discharge home.     Follow Up Recommendations Follow surgeon's recommendation for DC plan and follow-up therapies    Equipment Recommendations  Rolling walker with 5" wheels    Recommendations for Other Services       Precautions / Restrictions Precautions Precautions: Fall Restrictions Weight Bearing Restrictions: No      Mobility  Bed Mobility Overal bed mobility: Needs Assistance Bed Mobility: Supine to Sit;Sit to Supine     Supine to sit: HOB elevated;Supervision Sit to supine: HOB elevated;Min assist   General bed mobility comments: no cues needed, pt requires extra time to sit up and bring LE's off EOB. Pt reported dizziness with sitting up to EOB. symptoms did not subside after sitting for ~3 minutes and pt returned to supine with min assist to raise Rt LE into bed.  Transfers         Ambulation/Gait     Stairs   Wheelchair Mobility    Modified Rankin (Stroke Patients Only)       Balance  Overall balance assessment: Needs assistance   Sitting balance-Leahy Scale: Good            Pertinent Vitals/Pain Pain Assessment: 0-10 Pain Score: 7  Pain Location: Rt knee Pain Descriptors / Indicators: Aching;Sore Pain Intervention(s): Limited activity within patient's tolerance;Monitored during session;Ice applied    Home Living Family/patient expects to be discharged to:: Private residence Living Arrangements: Spouse/significant other Available Help at Discharge: Available 24 hours/day Type of Home: House Home Access: Stairs to enter Entrance Stairs-Rails: Can reach both Entrance Stairs-Number of Steps: 6 Home Layout: Two level;Able to live on main level with bedroom/bathroom Home Equipment: Kasandra Knudsen - single point      Prior Function Level of Independence: Independent               Hand Dominance   Dominant Hand: Right    Extremity/Trunk Assessment   Upper Extremity Assessment Upper Extremity Assessment: Overall WFL for tasks assessed    Lower Extremity Assessment Lower Extremity Assessment: RLE deficits/detail RLE Sensation: WNL RLE Coordination: decreased gross motor(some proximal hip weakness remains)    Cervical / Trunk Assessment Cervical / Trunk Assessment: Normal  Communication   Communication: No difficulties  Cognition Arousal/Alertness: Awake/alert Behavior During Therapy: WFL for tasks assessed/performed Overall Cognitive Status: Within Functional Limits for tasks assessed         General Comments      Exercises     Assessment/Plan    PT Assessment Patient needs continued PT services  PT Problem List  Decreased strength;Decreased balance;Decreased range of motion;Decreased activity tolerance;Decreased knowledge of use of DME;Decreased mobility       PT Treatment Interventions DME instruction;Functional mobility training;Balance training;Patient/family education;Therapeutic activities;Gait training;Stair training;Therapeutic  exercise    PT Goals (Current goals can be found in the Care Plan section)  Acute Rehab PT Goals Patient Stated Goal: go home today PT Goal Formulation: With patient Time For Goal Achievement: 10/03/19 Potential to Achieve Goals: Good    Frequency 7X/week    AM-PAC PT "6 Clicks" Mobility  Outcome Measure Help needed turning from your back to your side while in a flat bed without using bedrails?: None Help needed moving from lying on your back to sitting on the side of a flat bed without using bedrails?: A Little Help needed moving to and from a bed to a chair (including a wheelchair)?: A Little Help needed standing up from a chair using your arms (e.g., wheelchair or bedside chair)?: A Little Help needed to walk in hospital room?: A Little Help needed climbing 3-5 steps with a railing? : A Little 6 Click Score: 19    End of Session Equipment Utilized During Treatment: Gait belt Activity Tolerance: Patient tolerated treatment well Patient left: in bed;with call bell/phone within reach Nurse Communication: Mobility status;Other (comment)(recommend pt eat a real lunch prior to next therapy session) PT Visit Diagnosis: Muscle weakness (generalized) (M62.81);Difficulty in walking, not elsewhere classified (R26.2)    Time: [id number] PT Time Calculation (min) (ACUTE ONLY): 14 min   Charges:   PT Evaluation $PT Eval Low Complexity: 1 Low        Verner Mould, DPT Physical Therapist with [facility] [phone]  09/26/2019 1:22 PM

## 2019-09-26 NOTE — Anesthesia Procedure Notes (Signed)
Anesthesia Regional Block: Adductor canal block   Pre-Anesthetic Checklist: ,, timeout performed, Correct Patient, Correct Site, Correct Laterality, Correct Procedure, Correct Position, site marked, Risks and benefits discussed,  Surgical consent,  Pre-op evaluation,  At surgeon's request and post-op pain management  Laterality: Lower and Right  Prep: chloraprep       Needles:  Injection technique: Single-shot  Needle Type: Echogenic Stimulator Needle     Needle Length: 9cm  Needle Gauge: 21   Needle insertion depth: 3 cm   Additional Needles:   Procedures:,,,, ultrasound used (permanent image in chart),,,,  Narrative:  Start time: 09/26/2019 7:02 AM End time: 09/26/2019 7:10 AM Injection made incrementally with aspirations every 5 mL.  Performed by: Personally  Anesthesiologist: Lyn Hollingshead, MD

## 2019-09-26 NOTE — Anesthesia Procedure Notes (Signed)
Procedure Name: MAC Date/Time: 09/26/2019 7:22 AM Performed by: West Pugh, CRNA Pre-anesthesia Checklist: Patient identified, Emergency Drugs available, Suction available, Patient being monitored and Timeout performed Patient Re-evaluated:Patient Re-evaluated prior to induction Oxygen Delivery Method: Simple face mask Preoxygenation: Pre-oxygenation with 100% oxygen Induction Type: IV induction Placement Confirmation: positive ETCO2 and breath sounds checked- equal and bilateral

## 2019-09-26 NOTE — Anesthesia Procedure Notes (Addendum)
Spinal  Patient location during procedure: OR Start time: 09/26/2019 7:24 AM End time: 09/26/2019 7:31 AM Reason for block: at surgeon's request Staffing Performed: resident/CRNA  Resident/CRNA: West Pugh, CRNA Preanesthetic Checklist Completed: patient identified, IV checked, site marked, risks and benefits discussed, surgical consent, monitors and equipment checked, pre-op evaluation and timeout performed Spinal Block Patient position: sitting Prep: DuraPrep Patient monitoring: heart rate, continuous pulse ox and blood pressure Approach: midline Location: L3-4 Injection technique: single-shot Needle Needle type: Pencan  Needle gauge: 24 G Needle length: 9 cm Assessment Sensory level: T4 Additional Notes Expiration of kit checked and confirmed. Patient tolerated procedure well,without complications x 1 attempt with noted clear CSF. Loss of motor and sensory on exam post injection. Dr Jillyn Hidden present for procedure.

## 2019-09-27 DIAGNOSIS — Z808 Family history of malignant neoplasm of other organs or systems: Secondary | ICD-10-CM | POA: Diagnosis not present

## 2019-09-27 DIAGNOSIS — R918 Other nonspecific abnormal finding of lung field: Secondary | ICD-10-CM | POA: Diagnosis not present

## 2019-09-27 DIAGNOSIS — E785 Hyperlipidemia, unspecified: Secondary | ICD-10-CM | POA: Diagnosis not present

## 2019-09-27 DIAGNOSIS — Z7982 Long term (current) use of aspirin: Secondary | ICD-10-CM | POA: Diagnosis not present

## 2019-09-27 DIAGNOSIS — Z803 Family history of malignant neoplasm of breast: Secondary | ICD-10-CM | POA: Diagnosis not present

## 2019-09-27 DIAGNOSIS — Z79899 Other long term (current) drug therapy: Secondary | ICD-10-CM | POA: Diagnosis not present

## 2019-09-27 DIAGNOSIS — Z8546 Personal history of malignant neoplasm of prostate: Secondary | ICD-10-CM | POA: Diagnosis not present

## 2019-09-27 DIAGNOSIS — E876 Hypokalemia: Secondary | ICD-10-CM | POA: Diagnosis not present

## 2019-09-27 DIAGNOSIS — G8929 Other chronic pain: Secondary | ICD-10-CM | POA: Diagnosis not present

## 2019-09-27 DIAGNOSIS — Z87891 Personal history of nicotine dependence: Secondary | ICD-10-CM | POA: Diagnosis not present

## 2019-09-27 DIAGNOSIS — I10 Essential (primary) hypertension: Secondary | ICD-10-CM | POA: Diagnosis not present

## 2019-09-27 DIAGNOSIS — Z96651 Presence of right artificial knee joint: Secondary | ICD-10-CM | POA: Diagnosis not present

## 2019-09-27 DIAGNOSIS — M1711 Unilateral primary osteoarthritis, right knee: Secondary | ICD-10-CM | POA: Diagnosis not present

## 2019-09-27 DIAGNOSIS — Z8249 Family history of ischemic heart disease and other diseases of the circulatory system: Secondary | ICD-10-CM | POA: Diagnosis not present

## 2019-09-27 DIAGNOSIS — I252 Old myocardial infarction: Secondary | ICD-10-CM | POA: Diagnosis not present

## 2019-09-27 DIAGNOSIS — Z981 Arthrodesis status: Secondary | ICD-10-CM | POA: Diagnosis not present

## 2019-09-27 DIAGNOSIS — I2510 Atherosclerotic heart disease of native coronary artery without angina pectoris: Secondary | ICD-10-CM | POA: Diagnosis not present

## 2019-09-27 NOTE — Progress Notes (Addendum)
Physical Therapy Treatment Patient Details Name: Edward [name] MRN: [id number] DOB: Jun 23, 1951 Today's Date: 09/27/2019    History of Present Illness Patient is 69 y.o. male s/p Rt TKA on 09/26/19 with PMH significant for prostate cancer, MI, HTN, CAD, ACDF, and achilles surgery.    PT Comments    Progressing with mobility. Reviewed/practiced gait training and stair training. Instructed pt to wear KI for stair negotiation for safety. Issued HEP for pt to perform at home until HHPT begins. Encouraged him to try to have a cycle/schedule of exercises, walking, CPM, and bone foam as he is best able to.  All education completed. Okay to d/c home from PT standpoint.    Follow Up Recommendations  Follow surgeon's recommendation for DC plan and follow-up therapies     Equipment Recommendations  Rolling walker with 5" wheels    Recommendations for Other Services       Precautions / Restrictions Precautions Precautions: Fall Restrictions Weight Bearing Restrictions: No Other Position/Activity Restrictions: WBAT    Mobility  Bed Mobility Overal bed mobility: Needs Assistance Bed Mobility: Supine to Sit     Supine to sit: HOB elevated;Supervision     General bed mobility comments: Increased time. Supv for safety, lines  Transfers Overall transfer level: Needs assistance Equipment used: Rolling walker (2 wheeled) Transfers: Sit to/from Stand Sit to Stand: Min guard         General transfer comment: Close guard for safety. VCs safety, hand/LE placement.  Ambulation/Gait Ambulation/Gait assistance: Min guard Gait Distance (Feet): 85 Feet Assistive device: Rolling walker (2 wheeled) Gait Pattern/deviations: Step-to pattern     General Gait Details: Close guard for safety. VC safety, sequence, quad activation during stance on R and swing thru on L. Slow gait speed. Pt denied dizziness/lightheadedness.   Stairs Stairs: Yes Stairs assistance: Min guard Stair  Management: Step to pattern;Forwards;Two rails Number of Stairs: 5 General stair comments: up and over portable stairs x 2. VCs safety, technique, sequence. Used KI for safety. Very close guarding   Wheelchair Mobility    Modified Rankin (Stroke Patients Only)       Balance Overall balance assessment: Needs assistance         Standing balance support: During functional activity Standing balance-Leahy Scale: Fair                              Cognition Arousal/Alertness: Awake/alert Behavior During Therapy: WFL for tasks assessed/performed Overall Cognitive Status: Within Functional Limits for tasks assessed                                        Exercises Total Joint Exercises Ankle Circles/Pumps: AROM;Both;10 reps;Supine Quad Sets: AROM;Both;10 reps;Supine Heel Slides: AAROM;Right;10 reps;Supine Hip ABduction/ADduction: AROM;Right;10 reps;Supine Straight Leg Raises: AROM;Right;10 reps;Supine Long Arc Quad: AROM;Right;5 reps;Seated Knee Flexion: AROM;Right;Seated;5 reps Goniometric ROM: ~10-70 degrees    General Comments        Pertinent Vitals/Pain Pain Assessment: 0-10 Pain Score: 6  Pain Location: R knee Pain Descriptors / Indicators: Aching;Sore Pain Intervention(s): Monitored during session    Home Living                      Prior Function            PT Goals (current goals can now be found in the care plan section)  Progress towards PT goals: Progressing toward goals    Frequency    7X/week      PT Plan Current plan remains appropriate    Co-evaluation              AM-PAC PT "6 Clicks" Mobility   Outcome Measure  Help needed turning from your back to your side while in a flat bed without using bedrails?: A Little Help needed moving from lying on your back to sitting on the side of a flat bed without using bedrails?: A Little Help needed moving to and from a bed to a chair (including a  wheelchair)?: A Little Help needed standing up from a chair using your arms (e.g., wheelchair or bedside chair)?: A Little Help needed to walk in hospital room?: A Little Help needed climbing 3-5 steps with a railing? : A Little 6 Click Score: 18    End of Session Equipment Utilized During Treatment: Gait belt Activity Tolerance: Patient tolerated treatment well Patient left: in bed;with call bell/phone within reach;with nursing/sitter in room(with nursing student for assistance dressing)   PT Visit Diagnosis: Pain;Other abnormalities of gait and mobility (R26.89) Pain - Right/Left: Right Pain - part of body: Knee     Time: [id number] PT Time Calculation (min) (ACUTE ONLY): 20 min  Charges:  $Gait Training: 8-22 mins              Doreatha Massed, PT Acute Rehabilitation

## 2019-09-27 NOTE — Progress Notes (Signed)
Physical Therapy Treatment Patient Details Name: Edward [name] MRN: [id number] DOB: 1951-04-13 Today's Date: 09/27/2019    History of Present Illness Patient is 69 y.o. male s/p Rt TKA on 09/26/19 with PMH significant for prostate cancer, MI, HTN, CAD, ACDF, and achilles surgery.    PT Comments    Progressing with mobility. Will plan to have a 2nd session prior to possible d/c home later today.    Follow Up Recommendations  Follow surgeon's recommendation for DC plan and follow-up therapies     Equipment Recommendations       Recommendations for Other Services       Precautions / Restrictions Precautions Precautions: Fall Restrictions Weight Bearing Restrictions: No Other Position/Activity Restrictions: WBAT    Mobility  Bed Mobility Overal bed mobility: Needs Assistance Bed Mobility: Supine to Sit     Supine to sit: Supervision;HOB elevated     General bed mobility comments: Increased time. Supv for safety, lines  Transfers Overall transfer level: Needs assistance Equipment used: Rolling walker (2 wheeled) Transfers: Sit to/from Stand Sit to Stand: Min guard;From elevated surface         General transfer comment: Close guard for safety. VCs safety, hand/LE placement.  Ambulation/Gait Ambulation/Gait assistance: Min guard Gait Distance (Feet): 75 Feet Assistive device: Rolling walker (2 wheeled) Gait Pattern/deviations: Step-to pattern     General Gait Details: Close guard for safety. VC safety, sequence, quad activation during stance on R and swing thru on L. Slow gait speed. Pt denied dizziness/lightheadedness.   Stairs             Wheelchair Mobility    Modified Rankin (Stroke Patients Only)       Balance Overall balance assessment: Needs assistance         Standing balance support: Bilateral upper extremity supported;During functional activity Standing balance-Leahy Scale: Fair                               Cognition Arousal/Alertness: Awake/alert Behavior During Therapy: WFL for tasks assessed/performed Overall Cognitive Status: Within Functional Limits for tasks assessed                                        Exercises Total Joint Exercises Ankle Circles/Pumps: AROM;Both;10 reps;Supine Quad Sets: AROM;Both;10 reps;Supine Heel Slides: AAROM;Right;10 reps;Supine Hip ABduction/ADduction: AROM;Right;10 reps;Supine Straight Leg Raises: AROM;Right;10 reps;Supine Long Arc Quad: AROM;Right;5 reps;Seated Knee Flexion: AROM;Right;Seated;5 reps Goniometric ROM: ~10-70 degrees    General Comments        Pertinent Vitals/Pain Pain Assessment: 0-10 Pain Score: 5  Pain Location: R knee Pain Descriptors / Indicators: Aching;Sore Pain Intervention(s): Monitored during session;Ice applied;Repositioned    Home Living                      Prior Function            PT Goals (current goals can now be found in the care plan section) Progress towards PT goals: Progressing toward goals    Frequency    7X/week      PT Plan Current plan remains appropriate    Co-evaluation              AM-PAC PT "6 Clicks" Mobility   Outcome Measure  Help needed turning from your back to your side while in a flat bed without using  bedrails?: A Little Help needed moving from lying on your back to sitting on the side of a flat bed without using bedrails?: A Little Help needed moving to and from a bed to a chair (including a wheelchair)?: A Little Help needed standing up from a chair using your arms (e.g., wheelchair or bedside chair)?: A Little Help needed to walk in hospital room?: A Little Help needed climbing 3-5 steps with a railing? : A Little 6 Click Score: 18    End of Session Equipment Utilized During Treatment: Gait belt Activity Tolerance: Patient tolerated treatment well Patient left: in chair;with call bell/phone within reach   PT Visit Diagnosis:  Pain;Other abnormalities of gait and mobility (R26.89) Pain - Right/Left: Right Pain - part of body: Knee     Time: [id number] PT Time Calculation (min) (ACUTE ONLY): 33 min  Charges:  $Gait Training: 8-22 mins $Therapeutic Exercise: 8-22 mins                         Doreatha Massed, PT Acute Rehabilitation

## 2019-09-27 NOTE — TOC Progression Note (Signed)
Transition of Care [facility]) - Progression Note    Patient Details  Name: Edward [name] MRN: [id number] Date of Birth: [birth year]  Transition of Care [facility]) CM/SW Contact  Joaquin Courts, RN Phone Number: 09/27/2019, 12:11 PM  Clinical Narrative:    Patient [facility] up with [facility] for [place]. Patient reports has RW and 3in1.    Expected Discharge Plan: [place] Barriers to Discharge: No Barriers Identified  Expected Discharge Plan and Services Expected Discharge Plan: [place]   Discharge Planning Services: CM Consult Post Acute Care Choice: [place] arrangements for the past 2 months: Single Family Home Expected Discharge Date: 09/27/19               DME Arranged: N/A DME Agency: NA       HH Arranged: PT HH Agency: [facility] (formerly [employer]) Date [place]: 09/27/19 Time [place]: 1211 Representative spoke with at [place]: [place] ([place]) Interventions    Readmission Risk Interventions No flowsheet data found.

## 2019-09-27 NOTE — Progress Notes (Signed)
Subjective: 1 Day Post-Op Procedure(s) (LRB): TOTAL KNEE ARTHROPLASTY (Right) Patient reports pain as moderate. Taking by mouth and voiding okay.  Will be ready to go home after physical therapy.  Pain is manageable.   Objective: Vital signs in last 24 hours: Temp:  [97.5 F (36.4 C)-97.8 F (36.6 C)] 97.8 F (36.6 C) (02/27 [id number]) Pulse Rate:  [77-95] 77 (02/27 0633) Resp:  [12-20] 16 (02/27 [id number]) BP: (105-153)/(73-93) 153/93 (02/27 [id number]) SpO2:  [98 %-100 %] 100 % (02/27 [id number]) Weight:  [93.6 kg] 93.6 kg (02/26 1655)  Intake/Output from previous day: 02/26 0701 - 02/27 0700 In: 3669.4 [P.O.:720; I.V.:2549.4; IV Piggyback:400] Out: 2435 [Urine:2410; Blood:25] Intake/Output this shift: Total I/O In: 364.7 [P.O.:240; I.V.:124.7] Out: 400 [Urine:400]  No results for input(s): HGB in the last 72 hours. No results for input(s): WBC, RBC, HCT, PLT in the last 72 hours. No results for input(s): NA, K, CL, CO2, BUN, CREATININE, GLUCOSE, CALCIUM in the last 72 hours. No results for input(s): LABPT, INR in the last 72 hours. Right knee exam: Right knee dressing is clean and dry.  Calf is soft and nontender.  Neurovascular status is intact distally. Neurologically intact Intact pulses distally   Assessment/Plan: 1 Day Post-Op Procedure(s) (LRB): TOTAL KNEE ARTHROPLASTY (Right)  Plan: Aspirin 325 mg enteric-coated b.i.d. with food 1 month postop for DVT prophylaxis. Up with therapy Discharge home with home health After physical therapy completed this morning. Follow-up with Dr. Berenice Primas in 2 weeks.  Prescriptions have been sent into his pharmacy.    Patient's anticipated LOS is less than 2 midnights, meeting these requirements: - Younger than 69 - Lives within 1 hour of care - Has a competent adult at home to recover with post-op recover - NO history of  - Chronic pain requiring opiods  - Diabetes  - Coronary Artery Disease  - Heart failure  - Heart attack  - Stroke  -  DVT/VTE  - Cardiac arrhythmia  - Respiratory Failure/COPD  - Renal failure  - Anemia  - Advanced Liver disease       Edward [name] 09/27/2019, 9:12 AM

## 2019-09-27 NOTE — Plan of Care (Signed)
Pt to d/c home with family in stable condition. No needs at time of d/c instructions and education.

## 2019-09-27 NOTE — Discharge Summary (Signed)
Patient ID: ADAN DEWAARD MRN: [id number] DOB/AGE: [birth year] 69 y.o.  Admit date: 09/26/2019 Discharge date: 09/27/2019  Admission Diagnoses:  Principal Problem:   Primary osteoarthritis of right knee Active Problems:   Status post knee replacement   S/P knee replacement   Discharge Diagnoses:  Same  Past Medical History:  Diagnosis Date  . Chicken pox   . Coronary artery disease    a. 1999 s/p MI with cath/PCI;  b. 05/1999 Ex Cardiolite EF 68%, no ishcemia.  . Dyslipidemia   . Family history of breast cancer   . Family history of oral cancer   . History of tobacco abuse   . Hypertension   . Hypokalemia   . Injury of cervical spine ([place])    a. 02/2012 C4/5  . Measles   . Mumps   . Myocardial infarction ([place])   . Nasal bones, closed fracture    a. 02/2012 in setting of presyncope/fall  . Near syncope   . Pneumonia   . Prostate cancer ([place])   . Whooping cough     Surgeries: Procedure(s):Right TOTAL KNEE ARTHROPLASTY on 09/26/2019   Consultants:   Discharged Condition: Improved  Hospital Course: ZAM ENT is an 69 y.o. male who was admitted 09/26/2019 for operative treatment ofPrimary osteoarthritis of right knee. Patient has severe unremitting pain that affects sleep, daily activities, and work/hobbies. After pre-op clearance the patient was taken to the operating room on 09/26/2019 and underwent  Procedure(s):Right TOTAL KNEE ARTHROPLASTY.    Patient was given perioperative antibiotics:  Anti-infectives (From admission, onward)   Start     Dose/Rate Route Frequency Ordered Stop   09/26/19 1438  ceFAZolin (ANCEF) 2-4 GM/100ML-% IVPB    Note to Pharmacy: Bernadene Person   : cabinet override      09/26/19 1438 09/27/19 0244   09/26/19 1400  ceFAZolin (ANCEF) IVPB 2g/100 mL premix     2 g 200 mL/hr over 30 Minutes Intravenous Every 6 hours 09/26/19 1037 09/26/19 2106   09/26/19 0600  ceFAZolin (ANCEF) IVPB 2g/100 mL premix     2 g 200 mL/hr over 30  Minutes Intravenous On call to O.R. 09/26/19 0542 09/26/19 0802       Patient was given sequential compression devices, early ambulation, and chemoprophylaxis to prevent DVT.  Patient benefited maximally from hospital stay and there were no complications.    Recent vital signs:  Patient Vitals for the past 24 hrs:  BP Temp Temp src Pulse Resp SpO2 Height Weight  09/27/19 0633 (!) 153/93 97.8 F (36.6 C) Oral 77 16 100 % -- --  09/27/19 0132 (!) 144/91 (!) 97.5 F (36.4 C) Oral 81 16 100 % -- --  09/26/19 2208 131/84 97.7 F (36.5 C) Oral 80 16 99 % -- --  09/26/19 1655 139/84 (!) 97.5 F (36.4 C) Oral 80 20 100 % 6' (1.829 m) 93.6 kg  09/26/19 1515 122/78 -- -- 87 -- 99 % -- --  09/26/19 1100 118/77 -- -- 88 14 99 % -- --  09/26/19 1030 122/89 97.6 F (36.4 C) -- 89 15 100 % -- --  09/26/19 1015 116/83 -- -- 95 19 98 % -- --  09/26/19 1000 126/87 -- -- 85 17 100 % -- --  09/26/19 0945 105/73 97.6 F (36.4 C) -- 83 12 99 % -- --     Recent laboratory studies: No results for input(s): WBC, HGB, HCT, PLT, NA, K, CL, CO2, BUN, CREATININE, GLUCOSE, INR, CALCIUM in the  last 72 hours.  Invalid input(s): PT, 2   Discharge Medications:   Allergies as of 09/27/2019   No Known Allergies     Medication List    STOP taking these medications   meloxicam 15 MG tablet Commonly known as: MOBIC     TAKE these medications   amLODipine 10 MG tablet Commonly known as: NORVASC Take 10 mg by mouth daily.   aspirin EC 325 MG tablet Take 1 tablet (325 mg total) by mouth 2 (two) times daily after a meal. Take x 1 month post op to decrease risk of blood clots. What changed:   medication strength  how much to take  when to take this  additional instructions   atorvastatin 40 MG tablet Commonly known as: LIPITOR TAKE 1 TABLET(40 MG) BY MOUTH DAILY What changed:   how much to take  how to take this  when to take this  additional instructions   celecoxib 200 MG  capsule Commonly known as: CeleBREX Take 1 capsule (200 mg total) by mouth 2 (two) times daily.   cholecalciferol 1000 units tablet Commonly known as: VITAMIN D Take 1,000 Units by mouth daily.   docusate sodium 100 MG capsule Commonly known as: COLACE Take 100 mg by mouth daily. What changed: Another medication with the same name was added. Make sure you understand how and when to take each.   docusate sodium 100 MG capsule Commonly known as: Colace Take 1 capsule (100 mg total) by mouth 2 (two) times daily. What changed: You were already taking a medication with the same name, and this prescription was added. Make sure you understand how and when to take each.   ferrous sulfate 325 (65 FE) MG tablet Take 325 mg by mouth daily with breakfast.   Fish Oil 1000 MG Caps Take 1,000 mg by mouth daily.   furosemide 40 MG tablet Commonly known as: LASIX Take 40 mg by mouth daily as needed for edema.   Magnesium 250 MG Tabs Take 250 mg by mouth daily.   multivitamin tablet Take 1 tablet by mouth daily.   nitroGLYCERIN 0.4 MG SL tablet Commonly known as: NITROSTAT Place 1 tablet (0.4 mg total) under the tongue every 5 (five) minutes as needed for chest pain.   oxyCODONE-acetaminophen 5-325 MG tablet Commonly known as: PERCOCET/ROXICET Take 1-2 tablets by mouth every 6 (six) hours as needed for severe pain.   tiZANidine 2 MG tablet Commonly known as: ZANAFLEX Take 1 tablet (2 mg total) by mouth every 8 (eight) hours as needed for muscle spasms.   vitamin B-12 1000 MCG tablet Commonly known as: CYANOCOBALAMIN Take 1,000 mcg by mouth daily.            Durable Medical Equipment  (From admission, onward)         Start     Ordered   09/26/19 1715  DME Walker rolling  Once    Question:  Patient needs a walker to treat with the following condition  Answer:  Primary osteoarthritis of right knee   09/26/19 1714   09/26/19 1715  DME 3 n 1  Once     09/26/19 1714            Discharge Care Instructions  (From admission, onward)         Start     Ordered   09/27/19 0000  Weight bearing as tolerated    Question Answer Comment  Laterality right   Extremity Lower  09/27/19 0917   09/26/19 0000  Weight bearing as tolerated    Question Answer Comment  Laterality right   Extremity Lower      09/26/19 1008          Diagnostic Studies: DG Chest 2 View  Result Date: 09/19/2019 CLINICAL DATA:  Preoperative for knee replacement. Hx of cardiac stent placement, former smoker and HTN. EXAM: CHEST - 2 VIEW COMPARISON:  None. FINDINGS: Cardiac silhouette is normal in size. No mediastinal or hilar masses. No evidence of adenopathy. Linear opacity noted at the left lung base consistent with atelectasis. Lungs otherwise clear. Left hemidiaphragm is mildly elevated. No pleural effusion or pneumothorax. Skeletal structures are intact. IMPRESSION: 1. No acute cardiopulmonary disease. 2. Mild linear left lung base atelectasis above a mildly elevated hemidiaphragm. Electronically Signed   By: Lajean Manes M.D.   On: 09/19/2019 16:58    Disposition: Discharge disposition: 01-Home or Self Care       Discharge Instructions    CPM   Complete by: As directed    Continuous passive motion machine (CPM):      Use the CPM from  0 degrees 70 degrees for 6-8 hours per day.      You may increase by 10 degrees per day.  You may break it up into 2 or 3 sessions per day.      Use CPM for 1-2  weeks or until you are told to stop.   CPM   Complete by: As directed    Continuous passive motion machine (CPM):      Use the CPM from 0 to 70 for 6-8 hours per day.      You may increase by 10per day.  You may break it up into 2 or 3 sessions per day.      Use CPM for 1-2 weeks or until you are told to stop.   Call MD / Call 911   Complete by: As directed    If you experience chest pain or shortness of breath, CALL 911 and be transported to the hospital emergency room.   If you develope a fever above 101 F, pus (white drainage) or increased drainage or redness at the wound, or calf pain, call your surgeon's office.   Call MD / Call 911   Complete by: As directed    If you experience chest pain or shortness of breath, CALL 911 and be transported to the hospital emergency room.  If you develope a fever above 101 F, pus (white drainage) or increased drainage or redness at the wound, or calf pain, call your surgeon's office.   Diet general   Complete by: As directed    Do not put a pillow under the knee. Place it under the heel.   Complete by: As directed    Increase activity slowly as tolerated   Complete by: As directed    Increase activity slowly as tolerated   Complete by: As directed    TED hose   Complete by: As directed    Use stockings (TED hose) for2 weeks on both leg(s).  You may remove them at night for sleeping.   Weight bearing as tolerated   Complete by: As directed    Laterality: right   Extremity: Lower   Weight bearing as tolerated   Complete by: As directed    Laterality: right   Extremity: Lower      Follow-up Information    Dorna Leitz, MD. Schedule an appointment as  soon as possible for a visit in 2 weeks.   Specialty: Orthopedic Surgery Contact information: [place] [address] [phone]            Signed: Erlene Senters 09/27/2019, 9:17 AM

## 2019-09-27 NOTE — Plan of Care (Signed)
  Problem: Clinical Measurements: Goal: Respiratory complications will improve Outcome: Progressing   Problem: Clinical Measurements: Goal: Cardiovascular complication will be avoided Outcome: Progressing   Problem: Activity: Goal: Risk for activity intolerance will decrease Outcome: Progressing   Problem: Nutrition: Goal: Adequate nutrition will be maintained Outcome: Progressing   Problem: Elimination: Goal: Will not experience complications related to bowel motility Outcome: Progressing   Problem: Pain Managment: Goal: General experience of comfort will improve Outcome: Progressing   Problem: Safety: Goal: Ability to remain free from injury will improve Outcome: Progressing

## 2019-09-29 ENCOUNTER — Encounter: Payer: Self-pay | Admitting: *Deleted

## 2019-09-30 NOTE — Op Note (Signed)
NAME: DALAN, LAMOTTE MEDICAL RECORD [id number] ACCOUNT [id number] DATE OF BIRTH:Jan 29, 1951 FACILITY: [facility] LOCATION: [place], [place]  OPERATIVE REPORT  DATE OF PROCEDURE:  09/26/2019  ADDENDUM  The assistant in the case was Gaspar Skeeters PA-C.   Gaspar Skeeters was present for the entire case and was the sole and primary assistant.  VN/NUANCE  D:09/30/2019 T:09/30/2019 JOB:[id number]

## 2019-10-01 DIAGNOSIS — R262 Difficulty in walking, not elsewhere classified: Secondary | ICD-10-CM | POA: Diagnosis not present

## 2019-10-01 DIAGNOSIS — M25661 Stiffness of right knee, not elsewhere classified: Secondary | ICD-10-CM | POA: Diagnosis not present

## 2019-10-01 DIAGNOSIS — Z471 Aftercare following joint replacement surgery: Secondary | ICD-10-CM | POA: Diagnosis not present

## 2019-10-01 DIAGNOSIS — Z96651 Presence of right artificial knee joint: Secondary | ICD-10-CM | POA: Diagnosis not present

## 2019-10-03 DIAGNOSIS — Z96651 Presence of right artificial knee joint: Secondary | ICD-10-CM | POA: Diagnosis not present

## 2019-10-03 DIAGNOSIS — Z471 Aftercare following joint replacement surgery: Secondary | ICD-10-CM | POA: Diagnosis not present

## 2019-10-03 DIAGNOSIS — R262 Difficulty in walking, not elsewhere classified: Secondary | ICD-10-CM | POA: Diagnosis not present

## 2019-10-03 DIAGNOSIS — M25661 Stiffness of right knee, not elsewhere classified: Secondary | ICD-10-CM | POA: Diagnosis not present

## 2019-10-08 DIAGNOSIS — Z96651 Presence of right artificial knee joint: Secondary | ICD-10-CM | POA: Diagnosis not present

## 2019-10-08 DIAGNOSIS — M25661 Stiffness of right knee, not elsewhere classified: Secondary | ICD-10-CM | POA: Diagnosis not present

## 2019-10-08 DIAGNOSIS — R262 Difficulty in walking, not elsewhere classified: Secondary | ICD-10-CM | POA: Diagnosis not present

## 2019-10-08 DIAGNOSIS — Z471 Aftercare following joint replacement surgery: Secondary | ICD-10-CM | POA: Diagnosis not present

## 2019-10-09 DIAGNOSIS — Z9889 Other specified postprocedural states: Secondary | ICD-10-CM | POA: Diagnosis not present

## 2019-10-10 DIAGNOSIS — R262 Difficulty in walking, not elsewhere classified: Secondary | ICD-10-CM | POA: Diagnosis not present

## 2019-10-10 DIAGNOSIS — Z96651 Presence of right artificial knee joint: Secondary | ICD-10-CM | POA: Diagnosis not present

## 2019-10-10 DIAGNOSIS — Z471 Aftercare following joint replacement surgery: Secondary | ICD-10-CM | POA: Diagnosis not present

## 2019-10-10 DIAGNOSIS — M25661 Stiffness of right knee, not elsewhere classified: Secondary | ICD-10-CM | POA: Diagnosis not present

## 2019-10-13 DIAGNOSIS — R262 Difficulty in walking, not elsewhere classified: Secondary | ICD-10-CM | POA: Diagnosis not present

## 2019-10-13 DIAGNOSIS — M25661 Stiffness of right knee, not elsewhere classified: Secondary | ICD-10-CM | POA: Diagnosis not present

## 2019-10-13 DIAGNOSIS — Z96651 Presence of right artificial knee joint: Secondary | ICD-10-CM | POA: Diagnosis not present

## 2019-10-13 DIAGNOSIS — Z471 Aftercare following joint replacement surgery: Secondary | ICD-10-CM | POA: Diagnosis not present

## 2019-10-15 DIAGNOSIS — Z96651 Presence of right artificial knee joint: Secondary | ICD-10-CM | POA: Diagnosis not present

## 2019-10-15 DIAGNOSIS — M25661 Stiffness of right knee, not elsewhere classified: Secondary | ICD-10-CM | POA: Diagnosis not present

## 2019-10-15 DIAGNOSIS — Z471 Aftercare following joint replacement surgery: Secondary | ICD-10-CM | POA: Diagnosis not present

## 2019-10-15 DIAGNOSIS — R262 Difficulty in walking, not elsewhere classified: Secondary | ICD-10-CM | POA: Diagnosis not present

## 2019-10-17 DIAGNOSIS — Z96651 Presence of right artificial knee joint: Secondary | ICD-10-CM | POA: Diagnosis not present

## 2019-10-17 DIAGNOSIS — Z471 Aftercare following joint replacement surgery: Secondary | ICD-10-CM | POA: Diagnosis not present

## 2019-10-17 DIAGNOSIS — M25661 Stiffness of right knee, not elsewhere classified: Secondary | ICD-10-CM | POA: Diagnosis not present

## 2019-10-17 DIAGNOSIS — R262 Difficulty in walking, not elsewhere classified: Secondary | ICD-10-CM | POA: Diagnosis not present

## 2019-10-20 DIAGNOSIS — Z471 Aftercare following joint replacement surgery: Secondary | ICD-10-CM | POA: Diagnosis not present

## 2019-10-20 DIAGNOSIS — Z96651 Presence of right artificial knee joint: Secondary | ICD-10-CM | POA: Diagnosis not present

## 2019-10-20 DIAGNOSIS — M25661 Stiffness of right knee, not elsewhere classified: Secondary | ICD-10-CM | POA: Diagnosis not present

## 2019-10-20 DIAGNOSIS — R262 Difficulty in walking, not elsewhere classified: Secondary | ICD-10-CM | POA: Diagnosis not present

## 2019-10-22 DIAGNOSIS — Z96651 Presence of right artificial knee joint: Secondary | ICD-10-CM | POA: Diagnosis not present

## 2019-10-22 DIAGNOSIS — M25661 Stiffness of right knee, not elsewhere classified: Secondary | ICD-10-CM | POA: Diagnosis not present

## 2019-10-22 DIAGNOSIS — Z471 Aftercare following joint replacement surgery: Secondary | ICD-10-CM | POA: Diagnosis not present

## 2019-10-22 DIAGNOSIS — R262 Difficulty in walking, not elsewhere classified: Secondary | ICD-10-CM | POA: Diagnosis not present

## 2019-10-24 DIAGNOSIS — Z96651 Presence of right artificial knee joint: Secondary | ICD-10-CM | POA: Diagnosis not present

## 2019-10-24 DIAGNOSIS — Z471 Aftercare following joint replacement surgery: Secondary | ICD-10-CM | POA: Diagnosis not present

## 2019-10-24 DIAGNOSIS — R262 Difficulty in walking, not elsewhere classified: Secondary | ICD-10-CM | POA: Diagnosis not present

## 2019-10-24 DIAGNOSIS — M25661 Stiffness of right knee, not elsewhere classified: Secondary | ICD-10-CM | POA: Diagnosis not present

## 2019-10-27 DIAGNOSIS — M25661 Stiffness of right knee, not elsewhere classified: Secondary | ICD-10-CM | POA: Diagnosis not present

## 2019-10-27 DIAGNOSIS — R262 Difficulty in walking, not elsewhere classified: Secondary | ICD-10-CM | POA: Diagnosis not present

## 2019-10-27 DIAGNOSIS — Z96651 Presence of right artificial knee joint: Secondary | ICD-10-CM | POA: Diagnosis not present

## 2019-10-27 DIAGNOSIS — Z471 Aftercare following joint replacement surgery: Secondary | ICD-10-CM | POA: Diagnosis not present

## 2019-10-29 DIAGNOSIS — M25661 Stiffness of right knee, not elsewhere classified: Secondary | ICD-10-CM | POA: Diagnosis not present

## 2019-10-29 DIAGNOSIS — Z471 Aftercare following joint replacement surgery: Secondary | ICD-10-CM | POA: Diagnosis not present

## 2019-10-29 DIAGNOSIS — R262 Difficulty in walking, not elsewhere classified: Secondary | ICD-10-CM | POA: Diagnosis not present

## 2019-10-29 DIAGNOSIS — Z96651 Presence of right artificial knee joint: Secondary | ICD-10-CM | POA: Diagnosis not present

## 2019-10-30 DIAGNOSIS — Z471 Aftercare following joint replacement surgery: Secondary | ICD-10-CM | POA: Diagnosis not present

## 2019-10-30 DIAGNOSIS — M25661 Stiffness of right knee, not elsewhere classified: Secondary | ICD-10-CM | POA: Diagnosis not present

## 2019-10-30 DIAGNOSIS — Z96651 Presence of right artificial knee joint: Secondary | ICD-10-CM | POA: Diagnosis not present

## 2019-10-30 DIAGNOSIS — R262 Difficulty in walking, not elsewhere classified: Secondary | ICD-10-CM | POA: Diagnosis not present

## 2019-11-03 DIAGNOSIS — Z96651 Presence of right artificial knee joint: Secondary | ICD-10-CM | POA: Diagnosis not present

## 2019-11-03 DIAGNOSIS — Z471 Aftercare following joint replacement surgery: Secondary | ICD-10-CM | POA: Diagnosis not present

## 2019-11-03 DIAGNOSIS — R262 Difficulty in walking, not elsewhere classified: Secondary | ICD-10-CM | POA: Diagnosis not present

## 2019-11-03 DIAGNOSIS — M25661 Stiffness of right knee, not elsewhere classified: Secondary | ICD-10-CM | POA: Diagnosis not present

## 2019-11-05 DIAGNOSIS — Z471 Aftercare following joint replacement surgery: Secondary | ICD-10-CM | POA: Diagnosis not present

## 2019-11-05 DIAGNOSIS — Z96651 Presence of right artificial knee joint: Secondary | ICD-10-CM | POA: Diagnosis not present

## 2019-11-05 DIAGNOSIS — M25661 Stiffness of right knee, not elsewhere classified: Secondary | ICD-10-CM | POA: Diagnosis not present

## 2019-11-05 DIAGNOSIS — R262 Difficulty in walking, not elsewhere classified: Secondary | ICD-10-CM | POA: Diagnosis not present

## 2019-11-06 DIAGNOSIS — Z96651 Presence of right artificial knee joint: Secondary | ICD-10-CM | POA: Diagnosis not present

## 2019-11-06 DIAGNOSIS — Z471 Aftercare following joint replacement surgery: Secondary | ICD-10-CM | POA: Diagnosis not present

## 2019-11-06 DIAGNOSIS — M25661 Stiffness of right knee, not elsewhere classified: Secondary | ICD-10-CM | POA: Diagnosis not present

## 2019-11-06 DIAGNOSIS — R262 Difficulty in walking, not elsewhere classified: Secondary | ICD-10-CM | POA: Diagnosis not present

## 2019-11-11 DIAGNOSIS — Z471 Aftercare following joint replacement surgery: Secondary | ICD-10-CM | POA: Diagnosis not present

## 2019-11-11 DIAGNOSIS — M67911 Unspecified disorder of synovium and tendon, right shoulder: Secondary | ICD-10-CM | POA: Diagnosis not present

## 2019-11-11 DIAGNOSIS — M25461 Effusion, right knee: Secondary | ICD-10-CM | POA: Diagnosis not present

## 2019-11-12 DIAGNOSIS — R262 Difficulty in walking, not elsewhere classified: Secondary | ICD-10-CM | POA: Diagnosis not present

## 2019-11-12 DIAGNOSIS — M25661 Stiffness of right knee, not elsewhere classified: Secondary | ICD-10-CM | POA: Diagnosis not present

## 2019-11-12 DIAGNOSIS — Z471 Aftercare following joint replacement surgery: Secondary | ICD-10-CM | POA: Diagnosis not present

## 2019-11-12 DIAGNOSIS — Z96651 Presence of right artificial knee joint: Secondary | ICD-10-CM | POA: Diagnosis not present

## 2019-11-14 DIAGNOSIS — R262 Difficulty in walking, not elsewhere classified: Secondary | ICD-10-CM | POA: Diagnosis not present

## 2019-11-14 DIAGNOSIS — Z96651 Presence of right artificial knee joint: Secondary | ICD-10-CM | POA: Diagnosis not present

## 2019-11-14 DIAGNOSIS — Z471 Aftercare following joint replacement surgery: Secondary | ICD-10-CM | POA: Diagnosis not present

## 2019-11-14 DIAGNOSIS — M25661 Stiffness of right knee, not elsewhere classified: Secondary | ICD-10-CM | POA: Diagnosis not present

## 2019-11-17 DIAGNOSIS — Z471 Aftercare following joint replacement surgery: Secondary | ICD-10-CM | POA: Diagnosis not present

## 2019-11-17 DIAGNOSIS — R262 Difficulty in walking, not elsewhere classified: Secondary | ICD-10-CM | POA: Diagnosis not present

## 2019-11-17 DIAGNOSIS — M25661 Stiffness of right knee, not elsewhere classified: Secondary | ICD-10-CM | POA: Diagnosis not present

## 2019-11-17 DIAGNOSIS — Z96651 Presence of right artificial knee joint: Secondary | ICD-10-CM | POA: Diagnosis not present

## 2019-11-19 DIAGNOSIS — Z471 Aftercare following joint replacement surgery: Secondary | ICD-10-CM | POA: Diagnosis not present

## 2019-11-19 DIAGNOSIS — M25661 Stiffness of right knee, not elsewhere classified: Secondary | ICD-10-CM | POA: Diagnosis not present

## 2019-11-19 DIAGNOSIS — R262 Difficulty in walking, not elsewhere classified: Secondary | ICD-10-CM | POA: Diagnosis not present

## 2019-11-19 DIAGNOSIS — Z96651 Presence of right artificial knee joint: Secondary | ICD-10-CM | POA: Diagnosis not present

## 2019-11-21 DIAGNOSIS — R262 Difficulty in walking, not elsewhere classified: Secondary | ICD-10-CM | POA: Diagnosis not present

## 2019-11-21 DIAGNOSIS — Z471 Aftercare following joint replacement surgery: Secondary | ICD-10-CM | POA: Diagnosis not present

## 2019-11-21 DIAGNOSIS — M25661 Stiffness of right knee, not elsewhere classified: Secondary | ICD-10-CM | POA: Diagnosis not present

## 2019-11-21 DIAGNOSIS — Z96651 Presence of right artificial knee joint: Secondary | ICD-10-CM | POA: Diagnosis not present

## 2019-11-21 DIAGNOSIS — C61 Malignant neoplasm of prostate: Secondary | ICD-10-CM | POA: Diagnosis not present

## 2019-11-24 DIAGNOSIS — Z471 Aftercare following joint replacement surgery: Secondary | ICD-10-CM | POA: Diagnosis not present

## 2019-11-24 DIAGNOSIS — M25661 Stiffness of right knee, not elsewhere classified: Secondary | ICD-10-CM | POA: Diagnosis not present

## 2019-11-24 DIAGNOSIS — Z96651 Presence of right artificial knee joint: Secondary | ICD-10-CM | POA: Diagnosis not present

## 2019-11-24 DIAGNOSIS — R262 Difficulty in walking, not elsewhere classified: Secondary | ICD-10-CM | POA: Diagnosis not present

## 2019-11-26 DIAGNOSIS — R262 Difficulty in walking, not elsewhere classified: Secondary | ICD-10-CM | POA: Diagnosis not present

## 2019-11-26 DIAGNOSIS — Z471 Aftercare following joint replacement surgery: Secondary | ICD-10-CM | POA: Diagnosis not present

## 2019-11-26 DIAGNOSIS — Z96651 Presence of right artificial knee joint: Secondary | ICD-10-CM | POA: Diagnosis not present

## 2019-11-26 DIAGNOSIS — M25661 Stiffness of right knee, not elsewhere classified: Secondary | ICD-10-CM | POA: Diagnosis not present

## 2019-11-27 DIAGNOSIS — N5201 Erectile dysfunction due to arterial insufficiency: Secondary | ICD-10-CM | POA: Diagnosis not present

## 2019-11-27 DIAGNOSIS — C61 Malignant neoplasm of prostate: Secondary | ICD-10-CM | POA: Diagnosis not present

## 2019-11-28 DIAGNOSIS — Z96651 Presence of right artificial knee joint: Secondary | ICD-10-CM | POA: Diagnosis not present

## 2019-11-28 DIAGNOSIS — Z471 Aftercare following joint replacement surgery: Secondary | ICD-10-CM | POA: Diagnosis not present

## 2019-11-28 DIAGNOSIS — R262 Difficulty in walking, not elsewhere classified: Secondary | ICD-10-CM | POA: Diagnosis not present

## 2019-11-28 DIAGNOSIS — M25661 Stiffness of right knee, not elsewhere classified: Secondary | ICD-10-CM | POA: Diagnosis not present

## 2019-12-01 DIAGNOSIS — R262 Difficulty in walking, not elsewhere classified: Secondary | ICD-10-CM | POA: Diagnosis not present

## 2019-12-01 DIAGNOSIS — Z96651 Presence of right artificial knee joint: Secondary | ICD-10-CM | POA: Diagnosis not present

## 2019-12-01 DIAGNOSIS — M25661 Stiffness of right knee, not elsewhere classified: Secondary | ICD-10-CM | POA: Diagnosis not present

## 2019-12-01 DIAGNOSIS — Z471 Aftercare following joint replacement surgery: Secondary | ICD-10-CM | POA: Diagnosis not present

## 2019-12-03 DIAGNOSIS — M25661 Stiffness of right knee, not elsewhere classified: Secondary | ICD-10-CM | POA: Diagnosis not present

## 2019-12-03 DIAGNOSIS — Z96651 Presence of right artificial knee joint: Secondary | ICD-10-CM | POA: Diagnosis not present

## 2019-12-03 DIAGNOSIS — R262 Difficulty in walking, not elsewhere classified: Secondary | ICD-10-CM | POA: Diagnosis not present

## 2019-12-03 DIAGNOSIS — Z471 Aftercare following joint replacement surgery: Secondary | ICD-10-CM | POA: Diagnosis not present

## 2019-12-04 DIAGNOSIS — E538 Deficiency of other specified B group vitamins: Secondary | ICD-10-CM | POA: Diagnosis not present

## 2019-12-04 DIAGNOSIS — E7800 Pure hypercholesterolemia, unspecified: Secondary | ICD-10-CM | POA: Diagnosis not present

## 2019-12-04 DIAGNOSIS — R739 Hyperglycemia, unspecified: Secondary | ICD-10-CM | POA: Diagnosis not present

## 2019-12-04 DIAGNOSIS — G6289 Other specified polyneuropathies: Secondary | ICD-10-CM | POA: Diagnosis not present

## 2019-12-04 DIAGNOSIS — I10 Essential (primary) hypertension: Secondary | ICD-10-CM | POA: Diagnosis not present

## 2019-12-05 DIAGNOSIS — Z471 Aftercare following joint replacement surgery: Secondary | ICD-10-CM | POA: Diagnosis not present

## 2019-12-05 DIAGNOSIS — M25661 Stiffness of right knee, not elsewhere classified: Secondary | ICD-10-CM | POA: Diagnosis not present

## 2019-12-05 DIAGNOSIS — R262 Difficulty in walking, not elsewhere classified: Secondary | ICD-10-CM | POA: Diagnosis not present

## 2019-12-05 DIAGNOSIS — Z96651 Presence of right artificial knee joint: Secondary | ICD-10-CM | POA: Diagnosis not present

## 2019-12-08 DIAGNOSIS — Z96651 Presence of right artificial knee joint: Secondary | ICD-10-CM | POA: Diagnosis not present

## 2019-12-08 DIAGNOSIS — Z471 Aftercare following joint replacement surgery: Secondary | ICD-10-CM | POA: Diagnosis not present

## 2019-12-08 DIAGNOSIS — R262 Difficulty in walking, not elsewhere classified: Secondary | ICD-10-CM | POA: Diagnosis not present

## 2019-12-08 DIAGNOSIS — M25661 Stiffness of right knee, not elsewhere classified: Secondary | ICD-10-CM | POA: Diagnosis not present

## 2019-12-10 DIAGNOSIS — Z471 Aftercare following joint replacement surgery: Secondary | ICD-10-CM | POA: Diagnosis not present

## 2019-12-10 DIAGNOSIS — M25661 Stiffness of right knee, not elsewhere classified: Secondary | ICD-10-CM | POA: Diagnosis not present

## 2019-12-10 DIAGNOSIS — Z96651 Presence of right artificial knee joint: Secondary | ICD-10-CM | POA: Diagnosis not present

## 2019-12-10 DIAGNOSIS — R262 Difficulty in walking, not elsewhere classified: Secondary | ICD-10-CM | POA: Diagnosis not present

## 2019-12-11 DIAGNOSIS — Z9889 Other specified postprocedural states: Secondary | ICD-10-CM | POA: Diagnosis not present

## 2019-12-12 DIAGNOSIS — Z471 Aftercare following joint replacement surgery: Secondary | ICD-10-CM | POA: Diagnosis not present

## 2019-12-12 DIAGNOSIS — Z96651 Presence of right artificial knee joint: Secondary | ICD-10-CM | POA: Diagnosis not present

## 2019-12-12 DIAGNOSIS — M25661 Stiffness of right knee, not elsewhere classified: Secondary | ICD-10-CM | POA: Diagnosis not present

## 2019-12-12 DIAGNOSIS — R262 Difficulty in walking, not elsewhere classified: Secondary | ICD-10-CM | POA: Diagnosis not present

## 2019-12-15 DIAGNOSIS — Z96651 Presence of right artificial knee joint: Secondary | ICD-10-CM | POA: Diagnosis not present

## 2019-12-15 DIAGNOSIS — R262 Difficulty in walking, not elsewhere classified: Secondary | ICD-10-CM | POA: Diagnosis not present

## 2019-12-15 DIAGNOSIS — M25661 Stiffness of right knee, not elsewhere classified: Secondary | ICD-10-CM | POA: Diagnosis not present

## 2019-12-15 DIAGNOSIS — Z471 Aftercare following joint replacement surgery: Secondary | ICD-10-CM | POA: Diagnosis not present

## 2019-12-17 DIAGNOSIS — R262 Difficulty in walking, not elsewhere classified: Secondary | ICD-10-CM | POA: Diagnosis not present

## 2019-12-17 DIAGNOSIS — Z471 Aftercare following joint replacement surgery: Secondary | ICD-10-CM | POA: Diagnosis not present

## 2019-12-17 DIAGNOSIS — Z96651 Presence of right artificial knee joint: Secondary | ICD-10-CM | POA: Diagnosis not present

## 2019-12-17 DIAGNOSIS — M25661 Stiffness of right knee, not elsewhere classified: Secondary | ICD-10-CM | POA: Diagnosis not present

## 2019-12-18 DIAGNOSIS — R262 Difficulty in walking, not elsewhere classified: Secondary | ICD-10-CM | POA: Diagnosis not present

## 2019-12-18 DIAGNOSIS — Z471 Aftercare following joint replacement surgery: Secondary | ICD-10-CM | POA: Diagnosis not present

## 2019-12-18 DIAGNOSIS — Z96651 Presence of right artificial knee joint: Secondary | ICD-10-CM | POA: Diagnosis not present

## 2019-12-18 DIAGNOSIS — M25661 Stiffness of right knee, not elsewhere classified: Secondary | ICD-10-CM | POA: Diagnosis not present

## 2020-01-22 DIAGNOSIS — T8484XA Pain due to internal orthopedic prosthetic devices, implants and grafts, initial encounter: Secondary | ICD-10-CM | POA: Diagnosis not present

## 2020-01-22 DIAGNOSIS — M25561 Pain in right knee: Secondary | ICD-10-CM | POA: Diagnosis not present

## 2020-02-14 DIAGNOSIS — H01114 Allergic dermatitis of left upper eyelid: Secondary | ICD-10-CM | POA: Diagnosis not present

## 2020-02-16 DIAGNOSIS — I10 Essential (primary) hypertension: Secondary | ICD-10-CM | POA: Diagnosis not present

## 2020-02-16 DIAGNOSIS — E7800 Pure hypercholesterolemia, unspecified: Secondary | ICD-10-CM | POA: Diagnosis not present

## 2020-02-16 DIAGNOSIS — Z0000 Encounter for general adult medical examination without abnormal findings: Secondary | ICD-10-CM | POA: Diagnosis not present

## 2020-04-29 ENCOUNTER — Other Ambulatory Visit: Payer: Self-pay | Admitting: Nurse Practitioner

## 2020-04-29 DIAGNOSIS — R600 Localized edema: Secondary | ICD-10-CM

## 2020-04-29 DIAGNOSIS — E782 Mixed hyperlipidemia: Secondary | ICD-10-CM

## 2020-04-29 DIAGNOSIS — I10 Essential (primary) hypertension: Secondary | ICD-10-CM

## 2020-04-29 DIAGNOSIS — I2510 Atherosclerotic heart disease of native coronary artery without angina pectoris: Secondary | ICD-10-CM

## 2020-05-25 DIAGNOSIS — M19011 Primary osteoarthritis, right shoulder: Secondary | ICD-10-CM | POA: Diagnosis not present

## 2020-05-25 DIAGNOSIS — M67911 Unspecified disorder of synovium and tendon, right shoulder: Secondary | ICD-10-CM | POA: Diagnosis not present

## 2020-05-25 DIAGNOSIS — M25561 Pain in right knee: Secondary | ICD-10-CM | POA: Diagnosis not present

## 2020-06-09 DIAGNOSIS — C61 Malignant neoplasm of prostate: Secondary | ICD-10-CM | POA: Diagnosis not present

## 2020-06-16 DIAGNOSIS — N5201 Erectile dysfunction due to arterial insufficiency: Secondary | ICD-10-CM | POA: Diagnosis not present

## 2020-06-16 DIAGNOSIS — C61 Malignant neoplasm of prostate: Secondary | ICD-10-CM | POA: Diagnosis not present

## 2020-07-29 ENCOUNTER — Other Ambulatory Visit: Payer: Self-pay

## 2020-07-29 ENCOUNTER — Other Ambulatory Visit: Payer: Federal, State, Local not specified - PPO | Admitting: *Deleted

## 2020-07-29 DIAGNOSIS — R600 Localized edema: Secondary | ICD-10-CM | POA: Diagnosis not present

## 2020-07-29 DIAGNOSIS — I10 Essential (primary) hypertension: Secondary | ICD-10-CM

## 2020-07-29 DIAGNOSIS — E782 Mixed hyperlipidemia: Secondary | ICD-10-CM

## 2020-07-29 DIAGNOSIS — I2510 Atherosclerotic heart disease of native coronary artery without angina pectoris: Secondary | ICD-10-CM | POA: Diagnosis not present

## 2020-07-29 LAB — LIPID PANEL
Chol/HDL Ratio: 5.8 ratio — ABNORMAL HIGH (ref 0.0–5.0)
Cholesterol, Total: 236 mg/dL — ABNORMAL HIGH (ref 100–199)
HDL: 41 mg/dL (ref >39)
LDL Chol Calc (NIH): 160 mg/dL — ABNORMAL HIGH (ref 0–99)
Triglycerides: 189 mg/dL — ABNORMAL HIGH (ref 0–149)
VLDL Cholesterol Cal: 35 mg/dL (ref 5–40)

## 2020-07-29 LAB — BASIC METABOLIC PANEL
BUN/Creatinine Ratio: 8 — ABNORMAL LOW (ref 10–24)
BUN: 8 mg/dL (ref 8–27)
CO2: 23 mmol/L (ref 20–29)
Calcium: 9 mg/dL (ref 8.6–10.2)
Chloride: 104 mmol/L (ref 96–106)
Creatinine, Ser: 0.98 mg/dL (ref 0.76–1.27)
GFR calc Af Amer: 90 mL/min/{1.73_m2} (ref >59)
GFR calc non Af Amer: 78 mL/min/{1.73_m2} (ref >59)
Glucose: 92 mg/dL (ref 65–99)
Potassium: 3.8 mmol/L (ref 3.5–5.2)
Sodium: 142 mmol/L (ref 134–144)

## 2020-07-29 LAB — HEPATIC FUNCTION PANEL
ALT: 18 IU/L (ref 0–44)
AST: 20 IU/L (ref 0–40)
Albumin: 4.4 g/dL (ref 3.8–4.8)
Alkaline Phosphatase: 74 IU/L (ref 44–121)
Bilirubin Total: 0.4 mg/dL (ref 0.0–1.2)
Bilirubin, Direct: 0.1 mg/dL (ref 0.00–0.40)
Total Protein: 6.5 g/dL (ref 6.0–8.5)

## 2020-08-01 ENCOUNTER — Encounter: Payer: Self-pay | Admitting: Cardiovascular Disease

## 2020-08-01 NOTE — Progress Notes (Signed)
Edward [name] Date of Birth  1951/04/11       [facility] [place] [address], [place], [place]  [address]          Problem List: Coronary artery disease-status post inferior wall myocardial infarction-status post PTCA and stenting of his right coronary artery - 1999 2. Dyslipidemia 3. Hypertension 4. Hypokalemia 5. fall with subsequent neck fracture   November 18, 2012: Edward [name] is doing fairly well from a cardiac standpoint. He has not had any episodes of chest pain or shortness breath. He is still recovering from his neck fracture in August of 2013. He still has some residual hand tingling and numbness.  He also has some numbness in his feet.  He also has developed a peripheral neuropathy.  Jun 12, 2013:  Edward [name] is doing OK.  He has been found to have a ruptured disc that affected his gait - this ultimately is what caused his fall last year.  No further burining in feet.  Still rehabing  . He is back at work. No CP, no cardiac   Dec 15, 2013:  Edward [name] is doing well.   He has cut out most of his salt.   BP is much better.   No CP.  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  Not as much exercise recently.   Dec. 2, 2015:  Edward [name] is doing well.  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  Edward [name] had a mild chest pain - did not call or take NTG.  May have been indigestion.   He has improved his diet.  He needs fasting labs today He has requested a flu shot.  Dec. 6, 2017:  Doing well . Doing stationary bike  Labs from Dr. Inda Merlin look great   Feb. 28, 2019:  Edward [name] is seen today for follow up of his CAD .  Doing well  Talked about his daughter, Andee Poles who works as a Marine scientist  No CP or dyspnea ,  Is not as active as he needs to be  BP is elevated.   Did not take his meds yet today  Has gained some weight    July 08, 2019:  Edward [name] is seen today for follow-up of his coronary artery disease. Having some issues with his  right leg , knee pain Has been diagnosed with prostate cancer. Has prostate surgery  PSA is now 0  Has some leg edema .  ( in on Amlodipine 10 mg a day )   Will check an echo .    Jan. 4, 2022: Edward [name] is seen today for follow up of his CAD - s/p Inf. MI with stenting of his RCA in 1999 Works at the post office - night .  No CP ,  Tries to avoid salty foods but occasionally eats some salty foods.  Takes amlodipine 10 mg a day   Current Outpatient Medications on File Prior to Visit  Medication Sig Dispense Refill  . amLODipine (NORVASC) 10 MG tablet Take 10 mg by mouth daily.     Marland Kitchen aspirin 81 MG chewable tablet Chew 1 tablet by mouth daily in the afternoon.    . cholecalciferol (VITAMIN D) 1000 units tablet Take 1,000 Units by mouth daily.    Marland Kitchen docusate sodium (COLACE) 100 MG capsule Take 100 mg by mouth daily.    . ferrous sulfate 325 (65 FE) MG tablet Take  325 mg by mouth daily with breakfast.    . furosemide (LASIX) 40 MG tablet Take 40 mg by mouth daily as needed for edema.     . Magnesium 250 MG TABS Take 250 mg by mouth daily.    . meloxicam (MOBIC) 15 MG tablet Take 15 mg by mouth daily.    . Multiple Vitamin (MULTIVITAMIN) tablet Take 1 tablet by mouth daily.    . nitroGLYCERIN (NITROSTAT) 0.4 MG SL tablet Place 1 tablet (0.4 mg total) under the tongue every 5 (five) minutes as needed for chest pain. 25 tablet 6  . Omega-3 Fatty Acids (FISH OIL) 1000 MG CAPS Take 1,000 mg by mouth daily.     . vitamin B-12 (CYANOCOBALAMIN) 1000 MCG tablet Take 1,000 mcg by mouth daily.     No current facility-administered medications on file prior to visit.    No Known Allergies  Past Medical History:  Diagnosis Date  . Chicken pox   . Coronary artery disease    a. 1999 s/p MI with cath/PCI;  b. 05/1999 Ex Cardiolite EF 68%, no ishcemia.  . Dyslipidemia   . Family history of breast cancer   . Family history of oral cancer   . History of tobacco abuse   . Hypertension   .  Hypokalemia   . Injury of cervical spine (HCC)    a. 02/2012 C4/5  . Measles   . Mumps   . Myocardial infarction (HCC)   . Nasal bones, closed fracture    a. 02/2012 in setting of presyncope/fall  . Near syncope   . Pneumonia   . Prostate cancer (HCC)   . Whooping cough     Past Surgical History:  Procedure Laterality Date  . ACHILLES TENDON SURGERY  1989  . ANTERIOR CERVICAL DECOMP/DISCECTOMY FUSION  03/13/2012   Procedure: ANTERIOR CERVICAL DECOMPRESSION/DISCECTOMY FUSION 1 LEVEL;  Surgeon: Maeola Harman, MD;  Location: [facility];  Service: Neurosurgery;  Laterality: N/A;  Anterior Cervical Decompression/Fusion. Cervical four-five.  Marland Kitchen CARDIAC CATHETERIZATION  06/28/1998   single vessel CAD involving the distal RCA/PTCA and stenting of the distal RCA//EF- 50-55%  . CLOSED REDUCTION NASAL FRACTURE  03/13/2012   Procedure: CLOSED REDUCTION NASAL FRACTURE;  Surgeon: Wayland Denis, DO;  Location: [facility];  Service: Plastics;  Laterality: N/A;  Internal and external splinting of nasal fracture  . LYMPHADENECTOMY Bilateral 02/14/2018   Procedure: [facility];  Surgeon: Heloise Purpura, MD;  Location: [facility];  Service: Urology;  Laterality: Bilateral;  . NM MYOVIEW LTD  05/17/2009   normal stress nuclear study/no evidence of ischemia/EF- 68%  . petalla tendon surgery  1989  . ROBOT ASSISTED LAPAROSCOPIC RADICAL PROSTATECTOMY N/A 02/14/2018   Procedure: XI ROBOTIC ASSISTED LAPAROSCOPIC RADICAL PROSTATECTOMY;  Surgeon: Heloise Purpura, MD;  Location: [facility];  Service: Urology;  Laterality: N/A;  . TOTAL KNEE ARTHROPLASTY Right 09/26/2019   Procedure: TOTAL KNEE ARTHROPLASTY;  Surgeon: Jodi Geralds, MD;  Location: [facility];  Service: Orthopedics;  Laterality: Right;    Social History   Tobacco Use  Smoking Status Former Smoker  . Packs/day: 1.00  . Years: 20.00  . Pack years: 20.00  . Types: Cigarettes  . Quit date: 11/01/1988  . Years since quitting: 31.7  Smokeless Tobacco  Never Used    Social History   Substance and Sexual Activity  Alcohol Use Yes   Comment: occasional alcoholic beverage.    Family History  Problem Relation Age of Onset  . Heart attack Father  81  . Breast cancer Mother        dx late 2s, d. 47  . Cancer Sister        oral cancer dx 39  . Breast cancer Daughter        dx. 87, currently being treated    Reviw of Systems:  Reviewed in the HPI.  All other systems are negative.   Physical Exam: Blood pressure 138/80, pulse (!) 55, height 6' (1.829 m), weight 214 lb (97.1 kg), SpO2 99 %.  GEN:  Well nourished, well developed in no acute distress HEENT: Normal NECK: No JVD; No carotid bruits LYMPHATICS: No lymphadenopathy CARDIAC: RRR , no murmurs, rubs, gallops RESPIRATORY:  Clear to auscultation without rales, wheezing or rhonchi  ABDOMEN: Soft, non-tender, non-distended MUSCULOSKELETAL:  No edema; No deformity  SKIN: Warm and dry NEUROLOGIC:  Alert and oriented x 3    ECG: August 03, 2020: Sinus bradycardia at 55.  No ST or T wave changes.     Assessment / Plan:   1. CAD:   Doing well s/p PCI in 1999.  No recent episodes of angina.  Continue current medications. He is not having any angina.  He works out on his [employer] bike once or twice a week.  He stopped his atorvastatin.  I have encouraged him to restart his atorvastatin because his lipids have gone back up.  2.  History of leg edema:    3. hyperliperdemia -his last LDL last week was 160.  His LDL prior to that was in the 70 range.  We will restart atorvastatin.  Will check lipids, liver enzymes, basic metabolic profile in 3 months.  I will see him again in 1 year. .   4.  HTN:     Blood pressures been well controlled.   Will see him in 1 year with lipid levels, liver enzymes, basic metabolic profile.    Mertie Moores, MD  08/03/2020 10:18 AM    [facility] [place],  [place], [place]  [address] Pager [phone] Phone: [phone]; Fax: [phone]

## 2020-08-03 ENCOUNTER — Other Ambulatory Visit: Payer: Self-pay

## 2020-08-03 ENCOUNTER — Ambulatory Visit: Payer: Federal, State, Local not specified - PPO | Admitting: Cardiovascular Disease

## 2020-08-03 ENCOUNTER — Encounter: Payer: Self-pay | Admitting: Cardiovascular Disease

## 2020-08-03 VITALS — BP 138/80 | HR 55 | Ht 72.0 in | Wt 214.0 lb

## 2020-08-03 DIAGNOSIS — I2510 Atherosclerotic heart disease of native coronary artery without angina pectoris: Secondary | ICD-10-CM | POA: Diagnosis not present

## 2020-08-03 DIAGNOSIS — E782 Mixed hyperlipidemia: Secondary | ICD-10-CM

## 2020-08-03 MED ORDER — ATORVASTATIN CALCIUM 40 MG PO TABS: 40.0000 mg | ORAL_TABLET | Freq: Every day | ORAL | 3 refills | Status: DC

## 2020-08-03 NOTE — Patient Instructions (Signed)
Medication Instructions:  1) RESTART ATORVASTATIN (Lipitor) 40 mg daily *If you need a refill on your cardiac medications before your next appointment, please call your pharmacy*  Lab Work: Your provider recommends that you return for FASTING lab work in 3 months.   If you have labs (blood work) drawn today and your tests are completely normal, you will receive your results only by: Marland Kitchen MyChart Message (if you have MyChart) OR . A paper copy in the mail If you have any lab test that is abnormal or we need to change your treatment, we will call you to review the results.  Follow-Up: At [facility], you and your health needs are our priority.  As part of our continuing mission to provide you with exceptional heart care, we have created designated Provider Care Teams.  These Care Teams include your primary Cardiologist (physician) and Advanced Practice Providers (APPs -  Physician Assistants and Nurse Practitioners) who all work together to provide you with the care you need, when you need it. Your next appointment:   12 month(s) The format for your next appointment:   In Person Provider:   You may see Kristeen Miss, MD or one of the following Advanced Practice Providers on your designated Care Team:    Tereso Newcomer, PA-C  [place], [place]

## 2020-08-05 ENCOUNTER — Other Ambulatory Visit: Payer: Self-pay

## 2020-08-05 MED ORDER — NITROGLYCERIN 0.4 MG SL SUBL: 0.4000 mg | SUBLINGUAL_TABLET | SUBLINGUAL | 6 refills | Status: DC | PRN

## 2020-11-01 ENCOUNTER — Other Ambulatory Visit: Payer: Self-pay

## 2020-11-01 ENCOUNTER — Other Ambulatory Visit: Payer: Federal, State, Local not specified - PPO | Admitting: *Deleted

## 2020-11-01 DIAGNOSIS — E782 Mixed hyperlipidemia: Secondary | ICD-10-CM | POA: Diagnosis not present

## 2020-11-01 DIAGNOSIS — I2510 Atherosclerotic heart disease of native coronary artery without angina pectoris: Secondary | ICD-10-CM | POA: Diagnosis not present

## 2020-11-01 LAB — BASIC METABOLIC PANEL
BUN/Creatinine Ratio: 11 (ref 10–24)
BUN: 11 mg/dL (ref 8–27)
CO2: 23 mmol/L (ref 20–29)
Calcium: 9.5 mg/dL (ref 8.6–10.2)
Chloride: 101 mmol/L (ref 96–106)
Creatinine, Ser: 1.02 mg/dL (ref 0.76–1.27)
Glucose: 89 mg/dL (ref 65–99)
Potassium: 3.8 mmol/L (ref 3.5–5.2)
Sodium: 140 mmol/L (ref 134–144)
eGFR: 79 mL/min/{1.73_m2} (ref >59)

## 2020-11-01 LAB — HEPATIC FUNCTION PANEL
ALT: 31 IU/L (ref 0–44)
AST: 25 IU/L (ref 0–40)
Albumin: 4.7 g/dL (ref 3.8–4.8)
Alkaline Phosphatase: 85 IU/L (ref 44–121)
Bilirubin Total: 0.3 mg/dL (ref 0.0–1.2)
Bilirubin, Direct: <0.1 mg/dL (ref 0.00–0.40)
Total Protein: 7.3 g/dL (ref 6.0–8.5)

## 2020-11-01 LAB — LIPID PANEL
Chol/HDL Ratio: 4 ratio (ref 0.0–5.0)
Cholesterol, Total: 172 mg/dL (ref 100–199)
HDL: 43 mg/dL (ref >39)
LDL Chol Calc (NIH): 91 mg/dL (ref 0–99)
Triglycerides: 223 mg/dL — ABNORMAL HIGH (ref 0–149)
VLDL Cholesterol Cal: 38 mg/dL (ref 5–40)

## 2020-11-05 ENCOUNTER — Telehealth: Payer: Self-pay | Admitting: Cardiovascular Disease

## 2020-11-05 DIAGNOSIS — E782 Mixed hyperlipidemia: Secondary | ICD-10-CM

## 2020-11-05 NOTE — Telephone Encounter (Signed)
PT returning Edward [name]'s call to go over lab results.please advise

## 2020-11-05 NOTE — Telephone Encounter (Signed)
Called patient notified him of results and MD recommendations.  Lab appointment scheduled for February 07, 2021.  I told him to fast prior to appointment.  He verbalizes understanding.

## 2021-01-14 DIAGNOSIS — C61 Malignant neoplasm of prostate: Secondary | ICD-10-CM | POA: Diagnosis not present

## 2021-01-21 DIAGNOSIS — C61 Malignant neoplasm of prostate: Secondary | ICD-10-CM | POA: Diagnosis not present

## 2021-01-21 DIAGNOSIS — N5201 Erectile dysfunction due to arterial insufficiency: Secondary | ICD-10-CM | POA: Diagnosis not present

## 2021-02-07 ENCOUNTER — Other Ambulatory Visit: Payer: Self-pay

## 2021-02-07 ENCOUNTER — Other Ambulatory Visit: Payer: Federal, State, Local not specified - PPO

## 2021-02-07 DIAGNOSIS — E782 Mixed hyperlipidemia: Secondary | ICD-10-CM | POA: Diagnosis not present

## 2021-02-07 LAB — HEPATIC FUNCTION PANEL
ALT: 24 IU/L (ref 0–44)
AST: 25 IU/L (ref 0–40)
Albumin: 4.6 g/dL (ref 3.7–4.7)
Alkaline Phosphatase: 77 IU/L (ref 44–121)
Bilirubin Total: 0.5 mg/dL (ref 0.0–1.2)
Bilirubin, Direct: 0.12 mg/dL (ref 0.00–0.40)
Total Protein: 7 g/dL (ref 6.0–8.5)

## 2021-02-07 LAB — LIPID PANEL
Chol/HDL Ratio: 4 ratio (ref 0.0–5.0)
Cholesterol, Total: 166 mg/dL (ref 100–199)
HDL: 42 mg/dL (ref >39)
LDL Chol Calc (NIH): 92 mg/dL (ref 0–99)
Triglycerides: 189 mg/dL — ABNORMAL HIGH (ref 0–149)
VLDL Cholesterol Cal: 32 mg/dL (ref 5–40)

## 2021-02-11 ENCOUNTER — Telehealth: Payer: Self-pay | Admitting: Cardiovascular Disease

## 2021-02-11 NOTE — Telephone Encounter (Signed)
The patient has been notified of the result and verbalized understanding.  All questions (if any) were answered. Darrell Jewel, RN 02/11/2021 8:28 AM

## 2021-02-11 NOTE — Telephone Encounter (Signed)
Patient returning call for lab results. 

## 2021-02-23 DIAGNOSIS — I10 Essential (primary) hypertension: Secondary | ICD-10-CM | POA: Diagnosis not present

## 2021-02-23 DIAGNOSIS — R609 Edema, unspecified: Secondary | ICD-10-CM | POA: Diagnosis not present

## 2021-02-23 DIAGNOSIS — T887XXA Unspecified adverse effect of drug or medicament, initial encounter: Secondary | ICD-10-CM | POA: Diagnosis not present

## 2021-03-09 DIAGNOSIS — I10 Essential (primary) hypertension: Secondary | ICD-10-CM | POA: Diagnosis not present

## 2021-06-09 DIAGNOSIS — M79645 Pain in left finger(s): Secondary | ICD-10-CM | POA: Diagnosis not present

## 2021-08-12 DIAGNOSIS — Z23 Encounter for immunization: Secondary | ICD-10-CM | POA: Diagnosis not present

## 2021-08-12 DIAGNOSIS — E7800 Pure hypercholesterolemia, unspecified: Secondary | ICD-10-CM | POA: Diagnosis not present

## 2021-08-12 DIAGNOSIS — I2510 Atherosclerotic heart disease of native coronary artery without angina pectoris: Secondary | ICD-10-CM | POA: Diagnosis not present

## 2021-08-12 DIAGNOSIS — Z0000 Encounter for general adult medical examination without abnormal findings: Secondary | ICD-10-CM | POA: Diagnosis not present

## 2021-08-31 DIAGNOSIS — C61 Malignant neoplasm of prostate: Secondary | ICD-10-CM | POA: Diagnosis not present

## 2021-09-07 DIAGNOSIS — C61 Malignant neoplasm of prostate: Secondary | ICD-10-CM | POA: Diagnosis not present

## 2021-09-07 DIAGNOSIS — N5201 Erectile dysfunction due to arterial insufficiency: Secondary | ICD-10-CM | POA: Diagnosis not present

## 2021-10-20 ENCOUNTER — Other Ambulatory Visit: Payer: Self-pay

## 2021-10-20 MED ORDER — ATORVASTATIN CALCIUM 40 MG PO TABS: 40.0000 mg | ORAL_TABLET | Freq: Every day | ORAL | 0 refills | Status: DC

## 2021-11-01 ENCOUNTER — Encounter: Payer: Self-pay | Admitting: Cardiovascular Disease

## 2021-11-01 NOTE — Progress Notes (Signed)
This encounter was created in error - please disregard.

## 2021-11-02 ENCOUNTER — Encounter: Payer: Federal, State, Local not specified - PPO | Admitting: Cardiovascular Disease

## 2021-11-11 DIAGNOSIS — L282 Other prurigo: Secondary | ICD-10-CM | POA: Diagnosis not present

## 2021-11-28 ENCOUNTER — Ambulatory Visit: Payer: Federal, State, Local not specified - PPO | Admitting: Cardiovascular Disease

## 2021-11-28 ENCOUNTER — Encounter: Payer: Self-pay | Admitting: Cardiovascular Disease

## 2021-11-28 VITALS — BP 136/82 | HR 65 | Ht 72.0 in | Wt 207.2 lb

## 2021-11-28 DIAGNOSIS — E782 Mixed hyperlipidemia: Secondary | ICD-10-CM | POA: Diagnosis not present

## 2021-11-28 DIAGNOSIS — I2510 Atherosclerotic heart disease of native coronary artery without angina pectoris: Secondary | ICD-10-CM

## 2021-11-28 MED ORDER — ATORVASTATIN CALCIUM 40 MG PO TABS: 40.0000 mg | ORAL_TABLET | Freq: Every day | ORAL | 3 refills | Status: DC

## 2021-11-28 NOTE — Patient Instructions (Signed)
Medication Instructions:  ?Your physician recommends that you continue on your current medications as directed. Please refer to the Current Medication list given to you today. ? ?*If you need a refill on your cardiac medications before your next appointment, please call your pharmacy* ? ? ?Lab Work: ?None ?If you have labs (blood work) drawn today and your tests are completely normal, you will receive your results only by: ?MyChart Message (if you have MyChart) OR ?A paper copy in the mail ?If you have any lab test that is abnormal or we need to change your treatment, we will call you to review the results. ? ? ?Follow-Up: ?At [facility], you and your health needs are our priority.  As part of our continuing mission to provide you with exceptional heart care, we have created designated Provider Care Teams.  These Care Teams include your primary Cardiologist (physician) and Advanced Practice Providers (APPs -  Physician Assistants and Nurse Practitioners) who all work together to provide you with the care you need, when you need it. ? ?We recommend signing up for the patient portal called "MyChart".  Sign up information is provided on this After Visit Summary.  MyChart is used to connect with patients for Virtual Visits (Telemedicine).  Patients are able to view lab/test results, encounter notes, upcoming appointments, etc.  Non-urgent messages can be sent to your provider as well.   ?To learn more about what you can do with MyChart, go to [email or web address].   ? ?Your next appointment:   ?1 year(s) ? ?The format for your next appointment:   ?In Person ? ?Provider:   ?Mertie Moores, MD { ? ?  ?

## 2021-11-28 NOTE — Progress Notes (Signed)
? ? ? ?Edward [name] ?Date of Birth  31-Aug-1950 ?      ?[name]     ?[name]. [address]   ?[name], [name]  [address]    ?     ? ?Problem List: ?Coronary artery disease-status post inferior wall myocardial infarction-status post PTCA and stenting of his right coronary artery - 1999 ?2. Dyslipidemia ?3. Hypertension ?4. Hypokalemia ?5. fall with subsequent neck fracture ? ? ?November 18, 2012: ?Edward [name] is doing fairly well from a cardiac standpoint. He has not had any episodes of chest pain or shortness breath. He is still recovering from his neck fracture in August of 2013. He still has some residual hand tingling and numbness.  He also has some numbness in his feet.  He also has developed a peripheral neuropathy. ? ?Jun 12, 2013: ? ?Edward [name] is doing OK.  He has been found to have a ruptured disc that affected his gait - this ultimately is what caused his fall last year.  No further burining in feet.  Still rehabing  . He is back at work. ?No CP, no cardiac  ? ?Dec 15, 2013: ? ?Edward [name] is doing well.   He has cut out most of his salt.   BP is much better.   No CP.  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  Not as much exercise recently.  ? ?Dec. 2, 2015: ? ?Edward [name] is doing well.  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  ?Edward [name] had a mild chest pain - did not call or take NTG.  May have been indigestion.   He has improved his diet.  ?He needs fasting labs today ?He has requested a flu shot. ? ?Dec. 6, 2017: ? ?Doing well . ?Doing stationary bike  ?Labs from Dr. Inda Merlin look great  ? ?Feb. 28, 2019: ? ?Edward [name] is seen today for follow up of his CAD .  ?Doing well  ?Talked about his daughter, Edward [name] who works as a Marine scientist  ?No CP or dyspnea ,  Is not as active as he needs to be  ?BP is elevated.   Did not take his meds yet today  ?Has gained some weight   ? ?July 08, 2019: ? ?Edward [name] is seen today for follow-up of his coronary artery disease. ?Having some issues with his  right leg , knee pain ?Has been diagnosed with prostate cancer. ?Has prostate surgery  ?PSA is now 0 ? ?Has some leg edema .  ( in on Amlodipine 10 mg a day )  ? ?Will check an echo .  ? ? ?Jan. 4, 2022: ?Edward [name] is seen today for follow up of his CAD - s/p Inf. MI with stenting of his RCA in 1999 ?Works at the post office - night .  ?No CP ,  ?Tries to avoid salty foods but occasionally eats some salty foods.  ?Takes amlodipine 10 mg a day  ? ?Nov 28, 2021: ?Edward [name] is seen today for follow-up of his coronary artery disease.  He is status post inferior wall myocardial infarction with stenting of his RCA in 1999. ? ?No CP or dysnpea  ?Limited by his R knee TKA.   Has not worked out as well as he would hope  ?Lipids are back to the good range.  He has fixed his diet and is back on his atorvastatin  ? ?Current Outpatient Medications on File Prior to Visit  ?Medication Sig Dispense Refill  ?  amLODipine (NORVASC) 10 MG tablet Take 10 mg by mouth daily.     ? aspirin 81 MG chewable tablet Chew 1 tablet by mouth daily in the afternoon.    ? atorvastatin (LIPITOR) 40 MG tablet Take 1 tablet (40 mg total) by mouth daily. Please make overdue appt with Dr. Acie Fredrickson before anymore refills. Thank you 1st attempt 30 tablet 0  ? cholecalciferol (VITAMIN D) 1000 units tablet Take 1,000 Units by mouth daily.    ? docusate sodium (COLACE) 100 MG capsule Take 100 mg by mouth daily.    ? ferrous sulfate 325 (65 FE) MG tablet Take 325 mg by mouth daily with breakfast.    ? furosemide (LASIX) 40 MG tablet Take 40 mg by mouth daily as needed for edema.     ? Magnesium 250 MG TABS Take 250 mg by mouth daily.    ? meloxicam (MOBIC) 15 MG tablet Take 15 mg by mouth daily.    ? Multiple Vitamin (MULTIVITAMIN) tablet Take 1 tablet by mouth daily.    ? nitroGLYCERIN (NITROSTAT) 0.4 MG SL tablet Place 1 tablet (0.4 mg total) under the tongue every 5 (five) minutes as needed for chest pain. 25 tablet 6  ? Omega-3 Fatty Acids (FISH OIL) 1000 MG CAPS  Take 1,000 mg by mouth daily.     ? vitamin B-12 (CYANOCOBALAMIN) 1000 MCG tablet Take 1,000 mcg by mouth daily.    ? ?No current facility-administered medications on file prior to visit.  ? ? ?No Known Allergies ? ?Past Medical History:  ?Diagnosis Date  ? Chicken pox   ? Coronary artery disease   ? a. 1999 s/p MI with cath/PCI;  b. 05/1999 Ex Cardiolite EF 68%, no ishcemia.  ? Dyslipidemia   ? Family history of breast cancer   ? Family history of oral cancer   ? History of tobacco abuse   ? Hypertension   ? Hypokalemia   ? Injury of cervical spine ([facility])   ? a. 02/2012 C4/5  ? Measles   ? Mumps   ? Myocardial infarction [facility])   ? Nasal bones, closed fracture   ? a. 02/2012 in setting of presyncope/fall  ? Near syncope   ? Pneumonia   ? Prostate cancer ([facility])   ? Whooping cough   ? ? ?Past Surgical History:  ?Procedure Laterality Date  ? [place]  ? ANTERIOR CERVICAL DECOMP/DISCECTOMY FUSION  03/13/2012  ? Procedure: ANTERIOR CERVICAL DECOMPRESSION/DISCECTOMY FUSION 1 LEVEL;  Surgeon: Erline Levine, MD;  Location: [facility];  Service: Neurosurgery;  Laterality: N/A;  Anterior Cervical Decompression/Fusion. Cervical four-five.  ? CARDIAC CATHETERIZATION  06/28/1998  ? single vessel CAD involving the distal RCA/PTCA and stenting of the distal RCA//EF- 50-55%  ? CLOSED REDUCTION NASAL FRACTURE  03/13/2012  ? Procedure: CLOSED REDUCTION NASAL FRACTURE;  Surgeon: Theodoro Kos, DO;  Location: [facility];  Service: Plastics;  Laterality: N/A;  Internal and external splinting of nasal fracture  ? LYMPHADENECTOMY Bilateral 02/14/2018  ? Procedure: [facility];  Surgeon: Raynelle Bring, MD;  Location: [facility];  Service: Urology;  Laterality: Bilateral;  ? NM MYOVIEW LTD  05/17/2009  ? normal stress nuclear study/no evidence of ischemia/EF- 68%  ? petalla tendon surgery  1989  ? ROBOT ASSISTED LAPAROSCOPIC RADICAL PROSTATECTOMY N/A 02/14/2018  ? Procedure: XI ROBOTIC ASSISTED LAPAROSCOPIC RADICAL  PROSTATECTOMY;  Surgeon: Raynelle Bring, MD;  Location: [facility];  Service: Urology;  Laterality: N/A;  ? TOTAL KNEE ARTHROPLASTY Right 09/26/2019  ?  Procedure: TOTAL KNEE ARTHROPLASTY;  Surgeon: Dorna Leitz, MD;  Location: [facility];  Service: Orthopedics;  Laterality: Right;  ? ? ?Social History  ? ?Tobacco Use  ?Smoking Status Former  ? Packs/day: 1.00  ? Years: 20.00  ? Pack years: 20.00  ? Types: Cigarettes  ? Quit date: 11/01/1988  ? Years since quitting: 33.0  ?Smokeless Tobacco Never  ? ? ?Social History  ? ?Substance and Sexual Activity  ?Alcohol Use Yes  ? Comment: occasional alcoholic beverage.  ? ? ?Family History  ?Problem Relation Age of Onset  ? Heart attack Father   ?     41  ? Breast cancer Mother   ?     dx late 63s, d. 3  ? Cancer Sister   ?     oral cancer dx 45  ? Breast cancer Daughter   ?     dx. 55, currently being treated  ? ? ?Reviw of Systems:  ?Reviewed in the HPI.  All other systems are negative. ? ? ?Physical Exam: ?Blood pressure 136/82, pulse 65, height 6' (1.829 m), weight 207 lb 3.2 oz (94 kg), SpO2 97 %. ? ?GEN:  Well nourished, well developed in no acute distress ?HEENT: Normal ?NECK: No JVD; No carotid bruits ?LYMPHATICS: No lymphadenopathy ?CARDIAC: RRR , no murmurs, rubs, gallops ?RESPIRATORY:  Clear to auscultation without rales, wheezing or rhonchi  ?ABDOMEN: Soft, non-tender, non-distended ?MUSCULOSKELETAL:  No edema; No deformity  ?SKIN: Warm and dry ?NEUROLOGIC:  Alert and oriented x 3 ? ? ?ECG: Nov 28, 2021: Normal sinus rhythm at 65.  Minimal voltage criteria for left ventricular hypertrophy. ? ?   ?Assessment / Plan:  ? ?1. CAD:   He is not having any episodes of chest pain.  Continue current medication. ? ?2.  History of leg edema: Not having any edema. ? ? ?3. hyperliperdemia -lipids were reviewed.  His LDL is well controlled.  Continue good healthy diet and continue atorvastatin. ?. ? ? ?4.  HTN:      Blood pressure is well controlled. ? ? ? ? ?Mertie Moores, MD  ?11/28/2021  1:51 PM    ?[facility] [address],  [address] ?[place], [place]  [address] ?Pager [phone] ?Phone: [phone]; Fax: [phone]  ? ? ?

## 2021-12-26 DIAGNOSIS — R200 Anesthesia of skin: Secondary | ICD-10-CM | POA: Diagnosis not present

## 2021-12-26 DIAGNOSIS — G8929 Other chronic pain: Secondary | ICD-10-CM | POA: Diagnosis not present

## 2021-12-26 DIAGNOSIS — M545 Low back pain, unspecified: Secondary | ICD-10-CM | POA: Diagnosis not present

## 2022-01-04 DIAGNOSIS — M5441 Lumbago with sciatica, right side: Secondary | ICD-10-CM | POA: Diagnosis not present

## 2022-01-04 DIAGNOSIS — R262 Difficulty in walking, not elsewhere classified: Secondary | ICD-10-CM | POA: Diagnosis not present

## 2022-01-09 DIAGNOSIS — R262 Difficulty in walking, not elsewhere classified: Secondary | ICD-10-CM | POA: Diagnosis not present

## 2022-01-09 DIAGNOSIS — M5441 Lumbago with sciatica, right side: Secondary | ICD-10-CM | POA: Diagnosis not present

## 2022-01-12 DIAGNOSIS — M5441 Lumbago with sciatica, right side: Secondary | ICD-10-CM | POA: Diagnosis not present

## 2022-01-12 DIAGNOSIS — R262 Difficulty in walking, not elsewhere classified: Secondary | ICD-10-CM | POA: Diagnosis not present

## 2022-01-16 DIAGNOSIS — R262 Difficulty in walking, not elsewhere classified: Secondary | ICD-10-CM | POA: Diagnosis not present

## 2022-01-16 DIAGNOSIS — M5441 Lumbago with sciatica, right side: Secondary | ICD-10-CM | POA: Diagnosis not present

## 2022-01-17 ENCOUNTER — Encounter (HOSPITAL_BASED_OUTPATIENT_CLINIC_OR_DEPARTMENT_OTHER): Payer: Self-pay

## 2022-01-17 ENCOUNTER — Emergency Department (HOSPITAL_BASED_OUTPATIENT_CLINIC_OR_DEPARTMENT_OTHER): Payer: Federal, State, Local not specified - PPO

## 2022-01-17 ENCOUNTER — Emergency Department (HOSPITAL_BASED_OUTPATIENT_CLINIC_OR_DEPARTMENT_OTHER)
Admission: EM | Admit: 2022-01-17 | Discharge: 2022-01-17 | Disposition: A | Discharge status: Home / Self Care | Payer: Federal, State, Local not specified - PPO | Attending: Emergency Medicine | Admitting: Emergency Medicine

## 2022-01-17 DIAGNOSIS — Z7982 Long term (current) use of aspirin: Secondary | ICD-10-CM | POA: Insufficient documentation

## 2022-01-17 DIAGNOSIS — Z8546 Personal history of malignant neoplasm of prostate: Secondary | ICD-10-CM | POA: Diagnosis not present

## 2022-01-17 DIAGNOSIS — M79604 Pain in right leg: Secondary | ICD-10-CM | POA: Diagnosis not present

## 2022-01-17 DIAGNOSIS — M545 Low back pain, unspecified: Secondary | ICD-10-CM | POA: Diagnosis not present

## 2022-01-17 MED ORDER — OXYCODONE-ACETAMINOPHEN 5-325 MG PO TABS
1.0000 | ORAL_TABLET | Freq: Once | ORAL | Status: AC
  Administered 2022-01-17: 1 via ORAL
  Filled 2022-01-17: qty 1

## 2022-01-17 MED ORDER — OXYCODONE-ACETAMINOPHEN 5-325 MG PO TABS: 1.0000 | ORAL_TABLET | Freq: Four times a day (QID) | ORAL | 0 refills | Status: DC | PRN

## 2022-01-17 MED ORDER — DEXAMETHASONE SODIUM PHOSPHATE 10 MG/ML IJ SOLN
10.0000 mg | Freq: Once | INTRAMUSCULAR | Status: AC
  Administered 2022-01-17: 10 mg via INTRAVENOUS
  Filled 2022-01-17: qty 1

## 2022-01-17 MED ORDER — METHYLPREDNISOLONE 4 MG PO TBPK: ORAL_TABLET | ORAL | 0 refills | Status: DC

## 2022-01-17 NOTE — Discharge Instructions (Addendum)
Workup has been normal. Please take medications as prescribed and instructed.  SEEK IMMEDIATE MEDICAL ATTENTION IF: New numbness, tingling, weakness, or problem with the use of your arms or legs.  Severe back pain not relieved with medications.  Change in bowel or bladder control.  Urinary retention.  Numbness in your groin.  Increasing pain in any areas of the body (such as chest or abdominal pain).  Shortness of breath, dizziness or fainting.  Nausea (feeling sick to your stomach), vomiting, fever, or sweats.

## 2022-01-17 NOTE — ED Notes (Signed)
Patient transported to X-ray 

## 2022-01-17 NOTE — ED Provider Notes (Cosign Needed)
[facility] Provider Note   CSN: [id number] Arrival date & time: 01/17/22  9622     History  Chief Complaint  Patient presents with   Leg Pain    Edward [name] is a 71 y.o. male.  HPI 71 year old male with a past medical history significant for chronic low back pain with prior lumbar fusion presents to the emergency department today for evaluation of low back pain and right leg pain.  Patient reports associated paresthesias and radiculopathy pain.  Symptoms have been ongoing for the past 2 to 3 months.  Patient was seen by his PCP who ordered him an x-ray and physical therapy.  Patient is going to physical therapy and not having any relief of symptoms.  Patient reports that the pins and needle sensation is localized to the lateral aspect of his right leg.  He reports weakness because of the pain.  Patient denies any loss of bowel or bladder, saddle paresthesias or urinary retention.  Does have a prior history of prostate cancer.  Not currently receiving therapy.  Denies any history of IV drug use.  He has been taking his Mobic for the pain with little relief.    Home Medications Prior to Admission medications   Medication Sig Start Date End Date Taking? Authorizing Provider  amLODipine (NORVASC) 10 MG tablet Take 10 mg by mouth daily.     [provider]  aspirin 81 MG chewable tablet Chew 1 tablet by mouth daily in the afternoon. 09/27/19   [provider]  atorvastatin (LIPITOR) 40 MG tablet Take 1 tablet (40 mg total) by mouth daily. 11/28/21   Nahser, Wonda Cheng, MD  cholecalciferol (VITAMIN D) 1000 units tablet Take 1,000 Units by mouth daily.    [provider]  docusate sodium (COLACE) 100 MG capsule Take 100 mg by mouth daily.    [provider]  ferrous sulfate 325 (65 FE) MG tablet Take 325 mg by mouth daily with breakfast.    [provider]  furosemide (LASIX) 40 MG tablet Take 40 mg by mouth daily as  needed for edema.  10/28/18   [provider]  Magnesium 250 MG TABS Take 250 mg by mouth daily.    [provider]  meloxicam (MOBIC) 15 MG tablet Take 15 mg by mouth daily. 06/16/20   [provider]  Multiple Vitamin (MULTIVITAMIN) tablet Take 1 tablet by mouth daily.    [provider]  nitroGLYCERIN (NITROSTAT) 0.4 MG SL tablet Place 1 tablet (0.4 mg total) under the tongue every 5 (five) minutes as needed for chest pain. 08/05/20   Nahser, Wonda Cheng, MD  Omega-3 Fatty Acids (FISH OIL) 1000 MG CAPS Take 1,000 mg by mouth daily.     [provider]  vitamin B-12 (CYANOCOBALAMIN) 1000 MCG tablet Take 1,000 mcg by mouth daily.    [provider]      Allergies    Patient has no known allergies.    Review of Systems   Review of Systems Please refer to the HPI Physical Exam Updated Vital Signs BP (!) 144/94 (BP Location: Right Arm)   Pulse (!) 58   Temp 98 F (36.7 C) (Oral)   Resp 18   Ht 6' (1.829 m)   Wt 90.7 kg   SpO2 98%   BMI 27.12 kg/m  Physical Exam Vitals and nursing note reviewed.  Constitutional:      General: He is not in acute distress.  Appearance: He is well-developed. He is not ill-appearing or toxic-appearing.  HENT:     Head: Normocephalic and atraumatic.  Eyes:     General: No scleral icterus.       Right eye: No discharge.        Left eye: No discharge.  Cardiovascular:     Pulses: Normal pulses.  Pulmonary:     Effort: No respiratory distress.  Musculoskeletal:        General: Normal range of motion.     Cervical back: Normal range of motion.     Comments: Patient with some mild midline L-spine tenderness.  There is no step-offs or deformities.  Does have some right paraspinal tenderness radiates on the right buttocks over the sciatic nerve distribution.  Patient strength is 5 out of 5 in the left lower leg and 4-5 in the right lower leg secondary to pain.  Sensation is intact.  Extremities are warm  to touch.  Cap refill is normal.  PT and DP pulses are bounding bilaterally.  Patient has no palpable cord.  No erythema or warmth of the right calf.  Compartments are soft  Skin:    General: Skin is warm and dry.     Capillary Refill: Capillary refill takes less than 2 seconds.     Coloration: Skin is not pale.  Neurological:     Mental Status: He is alert.     Sensory: No sensory deficit.     Motor: No weakness.  Psychiatric:        Mood and Affect: Mood normal.        Behavior: Behavior normal.        Thought Content: Thought content normal.        Judgment: Judgment normal.     ED Results / Procedures / Treatments   Labs (all labs ordered are listed, but only abnormal results are displayed) Labs Reviewed - No data to display  EKG None  Radiology DG Lumbar Spine 2-3 Views  Result Date: 01/17/2022 CLINICAL DATA:  Lower back pain. EXAM: LUMBAR SPINE - 2-3 VIEW COMPARISON:  None Available. FINDINGS: There is no evidence of lumbar spine fracture. Alignment is normal. Moderate degenerative disc disease is noted at L3-4 and L4-5 with anterior osteophyte formation. IMPRESSION: Moderate multilevel degenerative disc disease. No acute abnormality seen. Electronically Signed   By: Marijo Conception M.D.   On: 01/17/2022 09:41    Procedures Procedures    Medications Ordered in ED Medications  oxyCODONE-acetaminophen (PERCOCET/ROXICET) 5-325 MG per tablet 1 tablet (has no administration in time range)  dexamethasone (DECADRON) injection 10 mg (has no administration in time range)    ED Course/ Medical Decision Making/ A&P                           Medical Decision Making 71 year old male presents the ER today for evaluation of right leg radiculopathy and low back pain.  Denies any red flag symptoms concerning for cauda equina.  Patient denies any trauma.  Does have a prior history of prostate cancer.  Imaging was obtained including x-ray which I reviewed and interpreted shows no  evidence of neoplasm, fracture.  No traumatic malalignment.  Patient symptoms seem consistent with radiculopathy or sciatic nerve pain.  I have low suspicion for DVT or peripheral arterial disease at this time.  Patient was given dose of pain medication and steroids.  Will send home with pain medication and steroids.  Patient needs  outpatient follow-up for likely MRI of his lumbar spine.  Patient is narcotic database was reviewed prior to prescription.  Patient educated on precautions with narcotic pain medications.  Pt is hemodynamically stable, in NAD, & able to ambulate in the ED. Evaluation does not show pathology that would require ongoing emergent intervention or inpatient treatment. I explained the diagnosis to the patient. Pain has been managed & has no complaints prior to dc. Pt is comfortable with above plan and is stable for discharge at this time. All questions were answered prior to disposition. Strict return precautions for f/u to the ED were discussed. Encouraged follow up with PCP.   Problems Addressed: Right leg pain: self-limited or minor problem  Amount and/or Complexity of Data Reviewed Independent Historian: spouse Radiology: ordered.  Risk Prescription drug management.           Final Clinical Impression(s) / ED Diagnoses Final diagnoses:  Right leg pain    Rx / DC Orders ED Discharge Orders          Ordered    methylPREDNISolone (MEDROL DOSEPAK) 4 MG TBPK tablet        01/17/22 1001    oxyCODONE-acetaminophen (PERCOCET/ROXICET) 5-325 MG tablet  Every 6 hours PRN        01/17/22 1001              Doristine Devoid, PA-C 01/17/22 1013

## 2022-01-17 NOTE — ED Triage Notes (Signed)
States being seen for right leg pain/pinched nerve at physical therapy for the past few months. States pain isnt improving.

## 2022-01-19 DIAGNOSIS — R262 Difficulty in walking, not elsewhere classified: Secondary | ICD-10-CM | POA: Diagnosis not present

## 2022-01-19 DIAGNOSIS — M5441 Lumbago with sciatica, right side: Secondary | ICD-10-CM | POA: Diagnosis not present

## 2022-01-23 DIAGNOSIS — R262 Difficulty in walking, not elsewhere classified: Secondary | ICD-10-CM | POA: Diagnosis not present

## 2022-01-23 DIAGNOSIS — M5441 Lumbago with sciatica, right side: Secondary | ICD-10-CM | POA: Diagnosis not present

## 2022-01-25 DIAGNOSIS — Z0000 Encounter for general adult medical examination without abnormal findings: Secondary | ICD-10-CM | POA: Diagnosis not present

## 2022-01-25 DIAGNOSIS — M545 Low back pain, unspecified: Secondary | ICD-10-CM | POA: Diagnosis not present

## 2022-01-25 DIAGNOSIS — R200 Anesthesia of skin: Secondary | ICD-10-CM | POA: Diagnosis not present

## 2022-01-25 DIAGNOSIS — E7800 Pure hypercholesterolemia, unspecified: Secondary | ICD-10-CM | POA: Diagnosis not present

## 2022-01-25 DIAGNOSIS — G8929 Other chronic pain: Secondary | ICD-10-CM | POA: Diagnosis not present

## 2022-01-26 DIAGNOSIS — M5441 Lumbago with sciatica, right side: Secondary | ICD-10-CM | POA: Diagnosis not present

## 2022-01-26 DIAGNOSIS — R262 Difficulty in walking, not elsewhere classified: Secondary | ICD-10-CM | POA: Diagnosis not present

## 2022-01-30 DIAGNOSIS — R262 Difficulty in walking, not elsewhere classified: Secondary | ICD-10-CM | POA: Diagnosis not present

## 2022-01-30 DIAGNOSIS — M5441 Lumbago with sciatica, right side: Secondary | ICD-10-CM | POA: Diagnosis not present

## 2022-02-02 DIAGNOSIS — R262 Difficulty in walking, not elsewhere classified: Secondary | ICD-10-CM | POA: Diagnosis not present

## 2022-02-02 DIAGNOSIS — M5441 Lumbago with sciatica, right side: Secondary | ICD-10-CM | POA: Diagnosis not present

## 2022-02-08 DIAGNOSIS — M549 Dorsalgia, unspecified: Secondary | ICD-10-CM | POA: Diagnosis not present

## 2022-02-08 DIAGNOSIS — M5416 Radiculopathy, lumbar region: Secondary | ICD-10-CM | POA: Diagnosis not present

## 2022-02-08 DIAGNOSIS — R748 Abnormal levels of other serum enzymes: Secondary | ICD-10-CM | POA: Diagnosis not present

## 2022-02-13 DIAGNOSIS — R262 Difficulty in walking, not elsewhere classified: Secondary | ICD-10-CM | POA: Diagnosis not present

## 2022-02-13 DIAGNOSIS — M5441 Lumbago with sciatica, right side: Secondary | ICD-10-CM | POA: Diagnosis not present

## 2022-02-27 DIAGNOSIS — R748 Abnormal levels of other serum enzymes: Secondary | ICD-10-CM | POA: Diagnosis not present

## 2022-03-01 DIAGNOSIS — C61 Malignant neoplasm of prostate: Secondary | ICD-10-CM | POA: Diagnosis not present

## 2022-03-08 DIAGNOSIS — N5201 Erectile dysfunction due to arterial insufficiency: Secondary | ICD-10-CM | POA: Diagnosis not present

## 2022-03-08 DIAGNOSIS — C61 Malignant neoplasm of prostate: Secondary | ICD-10-CM | POA: Diagnosis not present

## 2022-05-08 DIAGNOSIS — Z79899 Other long term (current) drug therapy: Secondary | ICD-10-CM | POA: Diagnosis not present

## 2022-05-08 DIAGNOSIS — E7800 Pure hypercholesterolemia, unspecified: Secondary | ICD-10-CM | POA: Diagnosis not present

## 2022-05-08 DIAGNOSIS — Z131 Encounter for screening for diabetes mellitus: Secondary | ICD-10-CM | POA: Diagnosis not present

## 2022-05-08 DIAGNOSIS — I10 Essential (primary) hypertension: Secondary | ICD-10-CM | POA: Diagnosis not present

## 2022-05-08 DIAGNOSIS — M549 Dorsalgia, unspecified: Secondary | ICD-10-CM | POA: Diagnosis not present

## 2022-06-09 DIAGNOSIS — H43813 Vitreous degeneration, bilateral: Secondary | ICD-10-CM | POA: Diagnosis not present

## 2022-06-09 DIAGNOSIS — H3509 Other intraretinal microvascular abnormalities: Secondary | ICD-10-CM | POA: Diagnosis not present

## 2022-06-09 DIAGNOSIS — H4311 Vitreous hemorrhage, right eye: Secondary | ICD-10-CM | POA: Diagnosis not present

## 2022-06-09 DIAGNOSIS — H31091 Other chorioretinal scars, right eye: Secondary | ICD-10-CM | POA: Diagnosis not present

## 2022-06-09 DIAGNOSIS — H35033 Hypertensive retinopathy, bilateral: Secondary | ICD-10-CM | POA: Diagnosis not present

## 2022-06-13 DIAGNOSIS — E663 Overweight: Secondary | ICD-10-CM | POA: Diagnosis not present

## 2022-06-13 DIAGNOSIS — M549 Dorsalgia, unspecified: Secondary | ICD-10-CM | POA: Diagnosis not present

## 2022-06-13 DIAGNOSIS — Z79899 Other long term (current) drug therapy: Secondary | ICD-10-CM | POA: Diagnosis not present

## 2022-06-16 DIAGNOSIS — H35033 Hypertensive retinopathy, bilateral: Secondary | ICD-10-CM | POA: Diagnosis not present

## 2022-06-16 DIAGNOSIS — H43813 Vitreous degeneration, bilateral: Secondary | ICD-10-CM | POA: Diagnosis not present

## 2022-06-16 DIAGNOSIS — H4311 Vitreous hemorrhage, right eye: Secondary | ICD-10-CM | POA: Diagnosis not present

## 2022-06-16 DIAGNOSIS — H3509 Other intraretinal microvascular abnormalities: Secondary | ICD-10-CM | POA: Diagnosis not present

## 2022-06-16 DIAGNOSIS — H35031 Hypertensive retinopathy, right eye: Secondary | ICD-10-CM | POA: Diagnosis not present

## 2022-06-25 DIAGNOSIS — E876 Hypokalemia: Secondary | ICD-10-CM | POA: Diagnosis not present

## 2022-06-25 DIAGNOSIS — R631 Polydipsia: Secondary | ICD-10-CM | POA: Diagnosis not present

## 2022-06-25 DIAGNOSIS — R350 Frequency of micturition: Secondary | ICD-10-CM | POA: Diagnosis not present

## 2022-06-27 DIAGNOSIS — Z86010 Personal history of colonic polyps: Secondary | ICD-10-CM | POA: Diagnosis not present

## 2022-07-11 ENCOUNTER — Encounter: Payer: Self-pay | Admitting: *Deleted

## 2022-07-11 ENCOUNTER — Telehealth: Payer: Self-pay | Admitting: *Deleted

## 2022-07-11 NOTE — Patient Outreach (Signed)
  Care Coordination   Initial Visit Note   07/11/2022 Name: Edward [name] MRN: [id number] DOB: [birth year]  Edward [name] is a 71 y.o. year old male who sees [facility] for primary care. I spoke with  [name] by phone today.  What matters to the patients health and wellness today?  No needs    Goals Addressed               This Visit's Progress     No needs (pt-stated)        Care Coordination Interventions: Reviewed medications with patient and discussed adherence with no needed refills Reviewed scheduled/upcoming provider appointments including sufficient transportation source Screening for signs and symptoms of depression related to chronic disease state  Assessed social determinant of health barriers          SDOH assessments and interventions completed:  Yes  SDOH Interventions Today    Flowsheet Row Most Recent Value  SDOH Interventions   Food Insecurity Interventions Intervention Not Indicated  Housing Interventions Intervention Not Indicated  Transportation Interventions Intervention Not Indicated  Utilities Interventions Intervention Not Indicated        Care Coordination Interventions:  Yes, provided   Follow up plan: No further intervention required.   Encounter Outcome:  Pt. Visit Completed   Raina Mina, RN Care Management Coordinator [place] Office [phone]

## 2022-07-11 NOTE — Patient Instructions (Signed)
Visit Information  Thank you for taking time to visit with me today. Please don't hesitate to contact me if I can be of assistance to you.   Following are the goals we discussed today:   Goals Addressed               This Visit's Progress     No needs (pt-stated)        Care Coordination Interventions: Reviewed medications with patient and discussed adherence with no needed refills Reviewed scheduled/upcoming provider appointments including sufficient transportation source Screening for signs and symptoms of depression related to chronic disease state  Assessed social determinant of health barriers         Please call the care guide team at [phone] if you need to cancel or reschedule your appointment.   If you are experiencing a Mental Health or [place] or need someone to talk to, please call the Suicide and Crisis Lifeline: 988 call the [place] National Suicide Prevention Lifeline: 9125536091 or TTY: 629 200 4171 TTY 501-288-3776) to talk to a trained counselor call 1-800-273-TALK (toll free, 24 hour hotline)  Patient verbalizes understanding of instructions and care plan provided today and agrees to view in [place]. Active MyChart status and patient understanding of how to access instructions and care plan via MyChart confirmed with patient.     No further follow up required: No needs    Raina Mina, RN Care Management Coordinator [place] Office [phone]

## 2022-07-18 ENCOUNTER — Encounter: Payer: Self-pay | Admitting: *Deleted

## 2022-07-18 NOTE — Telephone Encounter (Signed)
This encounter was created in error - please disregard.

## 2022-07-28 DIAGNOSIS — Z6828 Body mass index (BMI) 28.0-28.9, adult: Secondary | ICD-10-CM | POA: Diagnosis not present

## 2022-07-28 DIAGNOSIS — R030 Elevated blood-pressure reading, without diagnosis of hypertension: Secondary | ICD-10-CM | POA: Diagnosis not present

## 2022-07-28 DIAGNOSIS — E663 Overweight: Secondary | ICD-10-CM | POA: Diagnosis not present

## 2022-07-28 DIAGNOSIS — I10 Essential (primary) hypertension: Secondary | ICD-10-CM | POA: Diagnosis not present

## 2022-07-28 DIAGNOSIS — R5383 Other fatigue: Secondary | ICD-10-CM | POA: Diagnosis not present

## 2022-07-28 DIAGNOSIS — M549 Dorsalgia, unspecified: Secondary | ICD-10-CM | POA: Diagnosis not present

## 2022-07-28 DIAGNOSIS — Z79899 Other long term (current) drug therapy: Secondary | ICD-10-CM | POA: Diagnosis not present

## 2022-12-04 ENCOUNTER — Telehealth: Payer: Self-pay | Admitting: Cardiovascular Disease

## 2022-12-04 NOTE — Telephone Encounter (Signed)
   Pre-operative Risk Assessment    Patient Name: Edward [name]  DOB: 19-Jan-1951 MRN: [id number]      Request for Surgical Clearance    Procedure:   Left shoulder arthroscopy rotator cuff repair  Date of Surgery:  Clearance 12/13/22                                 Surgeon:  Dr. Jodi Geralds Surgeon's Group or Practice Name:  [facility]  Phone number:  [phone] Fax number:  [phone]   Type of Clearance Requested:   - Medical  - Pharmacy:  Hold TBD  by Cardiology   Type of Anesthesia:  General    Additional requests/questions:    SignedBrooks Sailors   12/04/2022, 8:56 AM

## 2022-12-04 NOTE — Telephone Encounter (Signed)
   Name: Edward [name]  DOB: 1951-06-10  MRN: [id number]  Primary Cardiologist: Kristeen Miss, MD   Preoperative team, please contact this patient and set up a phone call appointment for further preoperative risk assessment. Please obtain consent and complete medication review. Thank you for your help.  I confirm that guidance regarding antiplatelet and oral anticoagulation therapy has been completed and, if necessary, noted below.  Ideally aspirin should be continued without interruption, however if the bleeding risk is too great, aspirin may be held for 5-7 days prior to surgery. Please resume aspirin post operatively when it is felt to be safe from a bleeding standpoint.     Carlos Levering, NP 12/04/2022, 12:14 PM [facility]

## 2022-12-04 NOTE — Telephone Encounter (Signed)
Tried to call the pt but no answer.  

## 2022-12-05 NOTE — Telephone Encounter (Signed)
Left message for the pt to call back to schedule a tele pre op appt 

## 2022-12-06 ENCOUNTER — Telehealth: Payer: Self-pay | Admitting: *Deleted

## 2022-12-06 NOTE — Telephone Encounter (Signed)
S/w pt telephone clearance appt made.    Patient Consent for Virtual Visit         Edward [name] has provided verbal consent on 12/06/2022 for a virtual visit (video or telephone).   CONSENT FOR VIRTUAL VISIT FOR:  Edward [name]  By participating in this virtual visit I agree to the following:  I hereby voluntarily request, consent and authorize [facility] and its employed or contracted physicians, physician assistants, nurse practitioners or other licensed health care professionals (the Practitioner), to provide me with telemedicine health care services (the "Services") as deemed necessary by the treating Practitioner. I acknowledge and consent to receive the Services by the Practitioner via telemedicine. I understand that the telemedicine visit will involve communicating with the Practitioner through live audiovisual communication technology and the disclosure of certain medical information by electronic transmission. I acknowledge that I have been given the opportunity to request an in-person assessment or other available alternative prior to the telemedicine visit and am voluntarily participating in the telemedicine visit.  I understand that I have the right to withhold or withdraw my consent to the use of telemedicine in the course of my care at any time, without affecting my right to future care or treatment, and that the Practitioner or I may terminate the telemedicine visit at any time. I understand that I have the right to inspect all information obtained and/or recorded in the course of the telemedicine visit and may receive copies of available information for a reasonable fee.  I understand that some of the potential risks of receiving the Services via telemedicine include:  Delay or interruption in medical evaluation due to technological equipment failure or disruption; Information transmitted may not be sufficient (e.g. poor resolution of images) to allow for appropriate  medical decision making by the Practitioner; and/or  In rare instances, security protocols could fail, causing a breach of personal health information.  Furthermore, I acknowledge that it is my responsibility to provide information about my medical history, conditions and care that is complete and accurate to the best of my ability. I acknowledge that Practitioner's advice, recommendations, and/or decision may be based on factors not within their control, such as incomplete or inaccurate data provided by me or distortions of diagnostic images or specimens that may result from electronic transmissions. I understand that the practice of medicine is not an exact science and that Practitioner makes no warranties or guarantees regarding treatment outcomes. I acknowledge that a copy of this consent can be made available to me via my patient portal [facility] MyChart), or I can request a printed copy by calling the office of [facility].    I understand that my insurance will be billed for this visit.   I have read or had this consent read to me. I understand the contents of this consent, which adequately explains the benefits and risks of the Services being provided via telemedicine.  I have been provided ample opportunity to ask questions regarding this consent and the Services and have had my questions answered to my satisfaction. I give my informed consent for the services to be provided through the use of telemedicine in my medical care

## 2022-12-07 NOTE — Telephone Encounter (Signed)
Pt called in to cancel his preop tele visit. He states they have pushed off his surgery and do not have a specific date yet. He will c/b to r/s later.

## 2022-12-12 ENCOUNTER — Telehealth: Payer: Federal, State, Local not specified - PPO

## 2022-12-12 NOTE — Telephone Encounter (Signed)
error 

## 2023-01-15 ENCOUNTER — Encounter: Payer: Self-pay | Admitting: Cardiovascular Disease

## 2023-01-15 NOTE — Progress Notes (Signed)
Edward [name] Date of Birth  December 30, 1950       [facility] Office     [address]   [place], [place]  [address]          Problem List: Coronary artery disease-status post inferior wall myocardial infarction-status post PTCA and stenting of his right coronary artery - 1999 2. Dyslipidemia 3. Hypertension 4. Hypokalemia 5. fall with subsequent neck fracture   November 18, 2012: Edward [name] is doing fairly well from a cardiac standpoint. He has not had any episodes of chest pain or shortness breath. He is still recovering from his neck fracture in August of 2013. He still has some residual hand tingling and numbness.  He also has some numbness in his feet.  He also has developed a peripheral neuropathy.  Jun 12, 2013:  Edward [name] is doing OK.  He has been found to have a ruptured disc that affected his gait - this ultimately is what caused his fall last year.  No further burining in feet.  Still rehabing  . He is back at work. No CP, no cardiac   Dec 15, 2013:  Edward [name] is doing well.   He has cut out most of his salt.   BP is much better.   No CP.  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  Not as much exercise recently.   Dec. 2, 2015:  Edward [name] is doing well.  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  Edward [name] had a mild chest pain - did not call or take NTG.  May have been indigestion.   He has improved his diet.  He needs fasting labs today He has requested a flu shot.  Dec. 6, 2017:  Doing well . Doing stationary bike  Labs from Dr. Kevan Ny look great   Feb. 28, 2019:  Edward [name] is seen today for follow up of his CAD .  Doing well  Talked about his daughter, Duwayne Heck who works as a Engineer, civil (consulting)  No CP or dyspnea ,  Is not as active as he needs to be  BP is elevated.   Did not take his meds yet today  Has gained some weight    July 08, 2019:  Edward [name] is seen today for follow-up of his coronary artery disease. Having some issues with his  right leg , knee pain Has been diagnosed with prostate cancer. Has prostate surgery  PSA is now 0  Has some leg edema .  ( in on Amlodipine 10 mg a day )   Will check an echo .    Jan. 4, 2022: Edward [name] is seen today for follow up of his CAD - s/p Inf. MI with stenting of his RCA in 1999 Works at the post office - night .  No CP ,  Tries to avoid salty foods but occasionally eats some salty foods.  Takes amlodipine 10 mg a day   Nov 28, 2021: Edward [name] is seen today for follow-up of his coronary artery disease.  He is status post inferior wall myocardial infarction with stenting of his RCA in 1999.  No CP or dysnpea  Limited by his R knee TKA.   Has not worked out as well as he would hope  Lipids are back to the good range.  He has fixed his diet and is back on his atorvastatin    January 16, 2023 Edward [name] is seen for follow up of his CAD  Needs left rotator cuff repair   No cp.  Rides his bike several times a week  He wants to retire soon      Current Outpatient Medications on File Prior to Visit  Medication Sig Dispense Refill   amLODipine (NORVASC) 10 MG tablet Take 10 mg by mouth daily.      aspirin 81 MG chewable tablet Chew 1 tablet by mouth daily in the afternoon.     atorvastatin (LIPITOR) 40 MG tablet Take 1 tablet (40 mg total) by mouth daily. 90 tablet 3   cholecalciferol (VITAMIN D) 1000 units tablet Take 1,000 Units by mouth daily.     docusate sodium (COLACE) 100 MG capsule Take 100 mg by mouth daily.     ferrous sulfate 325 (65 FE) MG tablet Take 325 mg by mouth daily with breakfast.     Magnesium 250 MG TABS Take 250 mg by mouth daily.     Multiple Vitamin (MULTIVITAMIN) tablet Take 1 tablet by mouth daily.     nitroGLYCERIN (NITROSTAT) 0.4 MG SL tablet Place 1 tablet (0.4 mg total) under the tongue every 5 (five) minutes as needed for chest pain. 25 tablet 6   Omega-3 Fatty Acids (FISH OIL) 1000 MG CAPS Take 1,000 mg by mouth daily.       oxyCODONE-acetaminophen (PERCOCET/ROXICET) 5-325 MG tablet Take 1 tablet by mouth every 6 (six) hours as needed for severe pain. 12 tablet 0   vitamin B-12 (CYANOCOBALAMIN) 1000 MCG tablet Take 1,000 mcg by mouth daily.     No current facility-administered medications on file prior to visit.    No Known Allergies  Past Medical History:  Diagnosis Date   Chicken pox    Coronary artery disease    a. 1999 s/p MI with cath/PCI;  b. 05/1999 Ex Cardiolite EF 68%, no ishcemia.   Dyslipidemia    Family history of breast cancer    Family history of oral cancer    History of tobacco abuse    Hypertension    Hypokalemia    Injury of cervical spine (HCC)    a. 02/2012 C4/5   Measles    Mumps    Myocardial infarction [facility])    Nasal bones, closed fracture    a. 02/2012 in setting of presyncope/fall   Near syncope    Pneumonia    Prostate cancer (HCC)    Whooping cough     Past Surgical History:  Procedure Laterality Date   ACHILLES TENDON SURGERY  1989   ANTERIOR CERVICAL DECOMP/DISCECTOMY FUSION  03/13/2012   Procedure: ANTERIOR CERVICAL DECOMPRESSION/DISCECTOMY FUSION 1 LEVEL;  Surgeon: Maeola Harman, MD;  Location: [facility];  Service: Neurosurgery;  Laterality: N/A;  Anterior Cervical Decompression/Fusion. Cervical four-five.   CARDIAC CATHETERIZATION  06/28/1998   single vessel CAD involving the distal RCA/PTCA and stenting of the distal RCA//EF- 50-55%   CLOSED REDUCTION NASAL FRACTURE  03/13/2012   Procedure: CLOSED REDUCTION NASAL FRACTURE;  Surgeon: Wayland Denis, DO;  Location: [facility];  Service: Plastics;  Laterality: N/A;  Internal and external splinting of nasal fracture   LYMPHADENECTOMY Bilateral 02/14/2018   Procedure: [facility];  Surgeon: Heloise Purpura, MD;  Location: [facility];  Service: Urology;  Laterality: Bilateral;   NM MYOVIEW LTD  05/17/2009   normal stress nuclear study/no evidence of ischemia/EF- 68%   petalla tendon surgery  1989   ROBOT  ASSISTED LAPAROSCOPIC RADICAL PROSTATECTOMY N/A 02/14/2018   Procedure: XI ROBOTIC ASSISTED LAPAROSCOPIC RADICAL PROSTATECTOMY;  Surgeon: Heloise Purpura, MD;  Location: [facility];  Service: Urology;  Laterality: N/A;   TOTAL KNEE ARTHROPLASTY Right 09/26/2019   Procedure: TOTAL KNEE ARTHROPLASTY;  Surgeon: Jodi Geralds, MD;  Location: [facility];  Service: Orthopedics;  Laterality: Right;    Social History   Tobacco Use  Smoking Status Former   Packs/day: 1.00   Years: 20.00   Additional pack years: 0.00   Total pack years: 20.00   Types: Cigarettes   Quit date: 11/01/1988   Years since quitting: 34.2  Smokeless Tobacco Never    Social History   Substance and Sexual Activity  Alcohol Use Yes   Comment: occasional alcoholic beverage.    Family History  Problem Relation Age of Onset   Heart attack Father        104   Breast cancer Mother        dx late 1s, d. 38   Cancer Sister        oral cancer dx 42   Breast cancer Daughter        dx. 77, currently being treated    Reviw of Systems:  Reviewed in the HPI.  All other systems are negative.   Physical Exam: Blood pressure 126/82, pulse 74, height 6' (1.829 m), weight 199 lb 3.2 oz (90.4 kg), SpO2 98 %.       GEN:  Well nourished, well developed in no acute distress HEENT: Normal NECK: No JVD; No carotid bruits LYMPHATICS: No lymphadenopathy CARDIAC: RRR , no murmurs, rubs, gallops RESPIRATORY:  Clear to auscultation without rales, wheezing or rhonchi  ABDOMEN: Soft, non-tender, non-distended MUSCULOSKELETAL:  No edema; No deformity  SKIN: Warm and dry NEUROLOGIC:  Alert and oriented x 3          Assessment / Plan:   1. CAD:   no angina.    He is at low risk for his upcoming left rotator cuff repair   2.  History of leg edema:     3. hyperliperdemia  :  will check labs today  His last LDL was 61   4.  HTN:       BP is well controlled.   5..  pre op clearance:   he is at low risk for his upcoming surgery  .  He is on ASA 81 He may hold his ASA for 5-7 days prior to procedure      Kristeen Miss, MD  01/16/2023 12:14 PM    [facility] [address] [place],  [address] [place], [place]  [address] Pager [phone] Phone: [phone]; Fax: [phone]

## 2023-01-16 ENCOUNTER — Ambulatory Visit: Payer: Federal, State, Local not specified - PPO | Attending: Cardiovascular Disease | Admitting: Cardiovascular Disease

## 2023-01-16 ENCOUNTER — Encounter: Payer: Self-pay | Admitting: Cardiovascular Disease

## 2023-01-16 VITALS — BP 126/82 | HR 74 | Ht 72.0 in | Wt 199.2 lb

## 2023-01-16 DIAGNOSIS — E782 Mixed hyperlipidemia: Secondary | ICD-10-CM | POA: Diagnosis not present

## 2023-01-16 DIAGNOSIS — I10 Essential (primary) hypertension: Secondary | ICD-10-CM

## 2023-01-16 DIAGNOSIS — I2510 Atherosclerotic heart disease of native coronary artery without angina pectoris: Secondary | ICD-10-CM | POA: Diagnosis not present

## 2023-01-16 MED ORDER — NITROGLYCERIN 0.4 MG SL SUBL: SUBLINGUAL_TABLET | SUBLINGUAL | 6 refills | Status: DC

## 2023-01-16 NOTE — Patient Instructions (Signed)
Medication Instructions:  Your physician recommends that you continue on your current medications as directed. Please refer to the Current Medication list given to you today.  *If you need a refill on your cardiac medications before your next appointment, please call your pharmacy*   Lab Work: Lipids, BMET, ALT --- TODAY If you have labs (blood work) drawn today and your tests are completely normal, you will receive your results only by: MyChart Message (if you have MyChart) OR A paper copy in the mail If you have any lab test that is abnormal or we need to change your treatment, we will call you to review the results.    Follow-Up: At [facility], you and your health needs are our priority.  As part of our continuing mission to provide you with exceptional heart care, we have created designated Provider Care Teams.  These Care Teams include your primary Cardiologist (physician) and Advanced Practice Providers (APPs -  Physician Assistants and Nurse Practitioners) who all work together to provide you with the care you need, when you need it.   Your next appointment:   1 year(s)  Provider:   Kristeen Miss, MD

## 2023-01-17 LAB — LIPID PANEL
Chol/HDL Ratio: 3.6 ratio (ref 0.0–5.0)
Cholesterol, Total: 139 mg/dL (ref 100–199)
HDL: 39 mg/dL — ABNORMAL LOW (ref >39)
LDL Chol Calc (NIH): 72 mg/dL (ref 0–99)
Triglycerides: 160 mg/dL — ABNORMAL HIGH (ref 0–149)
VLDL Cholesterol Cal: 28 mg/dL (ref 5–40)

## 2023-01-17 LAB — BASIC METABOLIC PANEL
BUN/Creatinine Ratio: 23 (ref 10–24)
BUN: 21 mg/dL (ref 8–27)
CO2: 22 mmol/L (ref 20–29)
Calcium: 9.6 mg/dL (ref 8.6–10.2)
Chloride: 105 mmol/L (ref 96–106)
Creatinine, Ser: 0.91 mg/dL (ref 0.76–1.27)
Glucose: 89 mg/dL (ref 70–99)
Potassium: 3.9 mmol/L (ref 3.5–5.2)
Sodium: 142 mmol/L (ref 134–144)
eGFR: 89 mL/min/{1.73_m2} (ref >59)

## 2023-01-17 LAB — ALT: ALT: 28 IU/L (ref 0–44)

## 2023-01-19 ENCOUNTER — Telehealth: Payer: Self-pay | Admitting: Cardiovascular Disease

## 2023-01-19 NOTE — Telephone Encounter (Signed)
Lipids look good ALT and BMP are normal   Continue current meds.   Returned call to patient and provided above information. No further questions.

## 2023-01-19 NOTE — Telephone Encounter (Signed)
Pt would like a callback regarding his test results. Please advise

## 2023-01-30 ENCOUNTER — Ambulatory Visit: Payer: Federal, State, Local not specified - PPO | Admitting: Nurse Practitioner

## 2023-03-06 ENCOUNTER — Other Ambulatory Visit: Payer: Self-pay

## 2023-03-06 MED ORDER — ATORVASTATIN CALCIUM 40 MG PO TABS: 40.0000 mg | ORAL_TABLET | Freq: Every day | ORAL | 3 refills | Status: DC

## 2023-03-12 ENCOUNTER — Other Ambulatory Visit: Payer: Self-pay | Admitting: Orthopedic Surgery

## 2023-03-23 NOTE — Patient Instructions (Signed)
SURGICAL WAITING ROOM VISITATION Patients having surgery or a procedure may have no more than 2 support people in the waiting area - these visitors may rotate.    Children under the age of 64 must have an adult with them who is not the patient.  If the patient needs to stay at the hospital during part of their recovery, the visitor guidelines for inpatient rooms apply. Pre-op nurse will coordinate an appropriate time for 1 support person to accompany patient in pre-op.  This support person may not rotate.    Please refer to the [facility] website for the visitor guidelines for Inpatients (after your surgery is over and you are in a regular room).       Your procedure is scheduled on: 04-09-23   Report to [facility] Main Entrance    Report to admitting at 10:00AM   Call this number if you have problems the morning of surgery [phone]   Do not eat food :After Midnight.   After Midnight you may have the following liquids until 9:15 AM DAY OF SURGERY  Water Non-Citrus Juices (without pulp, NO RED-Apple, White grape, White cranberry) Black Coffee (NO MILK/CREAM OR CREAMERS, sugar ok)  Clear Tea (NO MILK/CREAM OR CREAMERS, sugar ok) regular and decaf                             Plain Jell-O (NO RED)                                           Fruit ices (not with fruit pulp, NO RED)                                     Popsicles (NO RED)                                                               Sports drinks like Gatorade (NO RED)                   The day of surgery:  Drink ONE (1) Pre-Surgery Clear Ensure or G2 at 9:15 AM the morning of surgery. Drink in one sitting. Do not sip.  This drink was given to you during your hospital  pre-op appointment visit. Nothing else to drink after completing the Pre-Surgery Clear Ensure or G2.          If you have questions, please contact your surgeon's office.   FOLLOW  ANY ADDITIONAL PRE OP INSTRUCTIONS YOU RECEIVED FROM YOUR  SURGEON'S OFFICE!!!     Oral Hygiene is also important to reduce your risk of infection.                                    Remember - BRUSH YOUR TEETH THE MORNING OF SURGERY WITH YOUR REGULAR TOOTHPASTE   Do NOT smoke after Midnight   Take these medicines the morning of surgery with A SIP OF WATER:   Amlodipine  Atorvastatin  Oxycodone if needed  Stop all vitamins and herbal supplements 7 days before surgery  Bring CPAP mask and tubing day of surgery.                              You may not have any metal on your body including  jewelry, and body piercing             Do not wear lotions, powders, cologne, or deodorant              Men may shave face and neck.   Do not bring valuables to the hospital. [facility] IS NOT RESPONSIBLE   FOR VALUABLES.   Contacts, dentures or bridgework may not be worn into surgery.  DO NOT BRING YOUR HOME MEDICATIONS TO THE HOSPITAL. PHARMACY WILL DISPENSE MEDICATIONS LISTED ON YOUR MEDICATION LIST TO YOU DURING YOUR ADMISSION IN THE HOSPITAL!    Patients discharged on the day of surgery will not be allowed to drive home.  Someone NEEDS to stay with you for the first 24 hours after anesthesia.   Special Instructions: Bring a copy of your healthcare power of attorney and living will documents the day of surgery if you haven't scanned them before.              Please read over the following fact sheets you were given: IF YOU HAVE QUESTIONS ABOUT YOUR PRE-OP INSTRUCTIONS PLEASE CALL [phone] Gwen  If you received a COVID test during your pre-op visit  it is requested that you wear a mask when out in public, stay away from anyone that may not be feeling well and notify your surgeon if you develop symptoms. If you test positive for Covid or have been in contact with anyone that has tested positive in the last 10 days please notify you surgeon.  [facility] - Preparing for Surgery Before surgery, you can play an important role.  Because skin is not  sterile, your skin needs to be as free of germs as possible.  You can reduce the number of germs on your skin by washing with CHG (chlorahexidine gluconate) soap before surgery.  CHG is an antiseptic cleaner which kills germs and bonds with the skin to continue killing germs even after washing. Please DO NOT use if you have an allergy to CHG or antibacterial soaps.  If your skin becomes reddened/irritated stop using the CHG and inform your nurse when you arrive at Short Stay. Do not shave (including legs and underarms) for at least 48 hours prior to the first CHG shower.  You may shave your face/neck.  Please follow these instructions carefully:  1.  Shower with CHG Soap the night before surgery and the  morning of surgery.  2.  If you choose to wash your hair, wash your hair first as usual with your normal  shampoo.  3.  After you shampoo, rinse your hair and body thoroughly to remove the shampoo.                             4.  Use CHG as you would any other liquid soap.  You can apply chg directly to the skin and wash.  Gently with a scrungie or clean washcloth.  5.  Apply the CHG Soap to your body ONLY FROM THE NECK DOWN.   Do   not use on face/  open                           Wound or open sores. Avoid contact with eyes, ears mouth and   genitals (private parts).                       Wash face,  Genitals (private parts) with your normal soap.             6.  Wash thoroughly, paying special attention to the area where your    surgery  will be performed.  7.  Thoroughly rinse your body with warm water from the neck down.  8.  DO NOT shower/wash with your normal soap after using and rinsing off the CHG Soap.                9.  Pat yourself dry with a clean towel.            10.  Wear clean pajamas.            11.  Place clean sheets on your bed the night of your first shower and do not  sleep with pets. Day of Surgery : Do not apply any lotions/deodorants the morning of surgery.  Please wear  clean clothes to the hospital/surgery center.  FAILURE TO FOLLOW THESE INSTRUCTIONS MAY RESULT IN THE CANCELLATION OF YOUR SURGERY  PATIENT SIGNATURE_________________________________  NURSE SIGNATURE__________________________________  ________________________________________________________________________

## 2023-03-23 NOTE — Progress Notes (Signed)
COVID Vaccine Completed:  Date of COVID positive in last 90 days:  PCP - [facility] Cardiologist - Kristeen Miss, MD  Cardiac clearance in Epic dated 01-16-23 by Dr. Elease Hashimoto  Chest x-ray -  EKG - 01-16-23 Epic Stress Test - 05-17-09 Epic ECHO - 07-18-19 Epic Cardiac Cath -  Pacemaker/ICD device last checked: Spinal Cord Stimulator:  Bowel Prep -   Sleep Study -  CPAP -   Fasting Blood Sugar -  Checks Blood Sugar _____ times a day  Last dose of GLP1 agonist-  N/A GLP1 instructions:  N/A   Last dose of SGLT-2 inhibitors-  N/A SGLT-2 instructions: N/A   Blood Thinner Instructions:  Time Aspirin Instructions:  ASA 81.  Hold 5-7 days Last Dose:  Activity level:  Can go up a flight of stairs and perform activities of daily living without stopping and without symptoms of chest pain or shortness of breath.  Able to exercise without symptoms  Unable to go up a flight of stairs without symptoms of     Anesthesia review: Hx of MI, CAD, HTN, hx of spinal cord injury  Patient denies shortness of breath, fever, cough and chest pain at PAT appointment  Patient verbalized understanding of instructions that were given to them at the PAT appointment. Patient was also instructed that they will need to review over the PAT instructions again at home before surgery.

## 2023-03-27 ENCOUNTER — Ambulatory Visit (HOSPITAL_COMMUNITY)
Admission: RE | Admit: 2023-03-27 | Discharge: 2023-03-27 | Disposition: A | Discharge status: Home / Self Care | Payer: Federal, State, Local not specified - PPO | Source: Ambulatory Visit | Attending: Orthopedic Surgery | Admitting: Orthopedic Surgery

## 2023-03-27 ENCOUNTER — Encounter (HOSPITAL_COMMUNITY): Payer: Self-pay

## 2023-03-27 ENCOUNTER — Encounter (HOSPITAL_COMMUNITY)
Admission: RE | Admit: 2023-03-27 | Discharge: 2023-03-27 | Disposition: A | Discharge status: Home / Self Care | Payer: Federal, State, Local not specified - PPO | Source: Ambulatory Visit | Attending: Orthopedic Surgery | Admitting: Orthopedic Surgery

## 2023-03-27 ENCOUNTER — Other Ambulatory Visit: Payer: Self-pay

## 2023-03-27 VITALS — BP 129/91 | HR 65 | Temp 98.3°F | Resp 16 | Ht 72.0 in | Wt 196.8 lb

## 2023-03-27 DIAGNOSIS — Z01818 Encounter for other preprocedural examination: Secondary | ICD-10-CM | POA: Diagnosis present

## 2023-03-27 DIAGNOSIS — I10 Essential (primary) hypertension: Secondary | ICD-10-CM | POA: Insufficient documentation

## 2023-03-27 LAB — BASIC METABOLIC PANEL
Anion gap: 10 (ref 5–15)
BUN: 18 mg/dL (ref 8–23)
CO2: 21 mmol/L — ABNORMAL LOW (ref 22–32)
Calcium: 8.7 mg/dL — ABNORMAL LOW (ref 8.9–10.3)
Chloride: 106 mmol/L (ref 98–111)
Creatinine, Ser: 1.09 mg/dL (ref 0.61–1.24)
GFR, Estimated: >60 mL/min (ref >60)
Glucose, Bld: 111 mg/dL — ABNORMAL HIGH (ref 70–99)
Potassium: 3.5 mmol/L (ref 3.5–5.1)
Sodium: 137 mmol/L (ref 135–145)

## 2023-03-30 ENCOUNTER — Telehealth: Payer: Self-pay | Admitting: Cardiovascular Disease

## 2023-03-30 MED ORDER — AMLODIPINE BESYLATE 10 MG PO TABS: 10.0000 mg | ORAL_TABLET | Freq: Every day | ORAL | 2 refills | Status: DC

## 2023-03-30 NOTE — Telephone Encounter (Signed)
Pt's medication was sent to pt's pharmacy as requested. Confirmation received.  °

## 2023-03-30 NOTE — Progress Notes (Signed)
Anesthesia Chart Review   Case: [id number] Date/Time: 04/09/23 1203   Procedure: SHOULDER ARTHROSCOPY WITH MINI OPEN ROTATOR CUFF REPAIR (Left)   Anesthesia type: General   Pre-op diagnosis: LEFT SHOULDER ROTATOR CUFF TEAR   Location: [facility] ROOM 06 / [facility]   Surgeons: Jodi Geralds, MD       DISCUSSION:72 y.o. former smoker with h/o HTN, CAD s/p PTCA and stenting of his right coronary artery in 1999, prostate cancer, left shoulder rotator cuff tear scheduled for above procedure 04/09/2023 with Dr. Jodi Geralds.   Patient was seen by cardiology 01/16/2023.  Patient asymptomatic, rides his bike several times a week.  Per office visit note, "he is at low risk for is upcoming for surgery.  He is on ASA 81.  He may hold his ASA for 5 to 7 days prior to procedure."   VS: BP (!) 129/91   Pulse 65   Temp 36.8 C (Oral)   Resp 16   Ht 6' (1.829 m)   Wt 89.3 kg   SpO2 100%   BMI 26.69 kg/m   PROVIDERS: [facility] is PCP   Kristeen Miss, MD is cardiologist LABS: Labs reviewed: Acceptable for surgery. (all labs ordered are listed, but only abnormal results are displayed)  Labs Reviewed  BASIC METABOLIC PANEL - Abnormal; Notable for the following components:      Result Value   CO2 21 (*)    Glucose, Bld 111 (*)    Calcium 8.7 (*)    All other components within normal limits     IMAGES:   EKG:   CV: Echo 07/18/2019  1. Left ventricular ejection fraction, by visual estimation, is 55 to  60%. The left ventricle has normal function. There is mildly increased  left ventricular hypertrophy.   2. Left ventricular diastolic parameters are consistent with Grade I  diastolic dysfunction (impaired relaxation).   3. The left ventricle has no regional wall motion abnormalities.   4. Global right ventricle has normal systolic function.The right  ventricular size is normal.   5. Left atrial size was normal.   6. Right atrial size was normal.   7. The mitral valve is normal  in structure. No evidence of mitral valve  regurgitation. No evidence of mitral stenosis.   8. The tricuspid valve is normal in structure. Tricuspid valve  regurgitation is trivial.   9. The aortic valve is tricuspid. Aortic valve regurgitation is not  visualized. Mild aortic valve sclerosis without stenosis.  10. The pulmonic valve was normal in structure. Pulmonic valve  regurgitation is trivial.  11. Normal LV systolic function; mild LVH: grade 1 diastolic dysfunction.  Past Medical History:  Diagnosis Date   Chicken pox    Coronary artery disease    a. 1999 s/p MI with cath/PCI;  b. 05/1999 Ex Cardiolite EF 68%, no ishcemia.   Dyslipidemia    Family history of breast cancer    Family history of oral cancer    History of tobacco abuse    Hypertension    Hypokalemia    Injury of cervical spine (HCC)    a. 02/2012 C4/5   Measles    Mumps    Myocardial infarction [facility])    Nasal bones, closed fracture    a. 02/2012 in setting of presyncope/fall   Near syncope    Pneumonia    Prostate cancer (HCC)    Whooping cough     Past Surgical History:  Procedure Laterality Date   ACHILLES TENDON  SURGERY  1989   ANTERIOR CERVICAL DECOMP/DISCECTOMY FUSION  03/13/2012   Procedure: ANTERIOR CERVICAL DECOMPRESSION/DISCECTOMY FUSION 1 LEVEL;  Surgeon: Maeola Harman, MD;  [facility];  Service: Neurosurgery;  Laterality: N/A;  Anterior Cervical Decompression/Fusion. Cervical four-five.   CARDIAC CATHETERIZATION  06/28/1998   single vessel CAD involving the distal RCA/PTCA and stenting of the distal RCA//EF- 50-55%   CLOSED REDUCTION NASAL FRACTURE  03/13/2012   Procedure: CLOSED REDUCTION NASAL FRACTURE;  Surgeon: Wayland Denis, DO;  Location: [facility];  Service: Plastics;  Laterality: N/A;  Internal and external splinting of nasal fracture   LYMPHADENECTOMY Bilateral 02/14/2018   Procedure: [facility];  Surgeon: Heloise Purpura, MD;  Location: [facility];  Service: Urology;   Laterality: Bilateral;   NM MYOVIEW LTD  05/17/2009   normal stress nuclear study/no evidence of ischemia/EF- 68%   petalla tendon surgery  1989   ROBOT ASSISTED LAPAROSCOPIC RADICAL PROSTATECTOMY N/A 02/14/2018   Procedure: XI ROBOTIC ASSISTED LAPAROSCOPIC RADICAL PROSTATECTOMY;  Surgeon: Heloise Purpura, MD;  Location: [facility];  Service: Urology;  Laterality: N/A;   TOTAL KNEE ARTHROPLASTY Right 09/26/2019   Procedure: TOTAL KNEE ARTHROPLASTY;  Surgeon: Jodi Geralds, MD;  Location[facility];  Service: Orthopedics;  Laterality: Right;    MEDICATIONS:  amLODipine (NORVASC) 10 MG tablet   aspirin 81 MG chewable tablet   atorvastatin (LIPITOR) 40 MG tablet   cholecalciferol (VITAMIN D) 1000 units tablet   docusate sodium (COLACE) 100 MG capsule   ferrous sulfate 325 (65 FE) MG tablet   Magnesium 250 MG TABS   meloxicam (MOBIC) 15 MG tablet   Multiple Vitamin (MULTIVITAMIN) tablet   nitroGLYCERIN (NITROSTAT) 0.4 MG SL tablet   Omega-3 Fatty Acids (FISH OIL) 1000 MG CAPS   oxyCODONE-acetaminophen (PERCOCET/ROXICET) 5-325 MG tablet   vitamin B-12 (CYANOCOBALAMIN) 1000 MCG tablet   No current facility-administered medications for this encounter.    Jodell Cipro Ward, PA-C [facility] Pre-Surgical Testing [phone]

## 2023-03-30 NOTE — Anesthesia Preprocedure Evaluation (Addendum)
Anesthesia Evaluation  Patient identified by MRN, date of birth, ID band Patient awake    Reviewed: Allergy & Precautions, H&P , NPO status , Patient's Chart, lab work & pertinent test results  Airway Mallampati: II  TM Distance: >3 FB Neck ROM: Full    Dental no notable dental hx.    Pulmonary neg pulmonary ROS, former smoker   Pulmonary exam normal breath sounds clear to auscultation       Cardiovascular hypertension, + CAD and + Past MI  Normal cardiovascular exam Rhythm:Regular Rate:Normal     Neuro/Psych negative neurological ROS  negative psych ROS   GI/Hepatic negative GI ROS, Neg liver ROS,,,  Endo/Other  negative endocrine ROS    Renal/GU negative Renal ROS  negative genitourinary   Musculoskeletal  (+) Arthritis , Osteoarthritis,    Abdominal   Peds negative pediatric ROS (+)  Hematology negative hematology ROS (+)   Anesthesia Other Findings   Reproductive/Obstetrics negative OB ROS                             Anesthesia Physical Anesthesia Plan  ASA: 3  Anesthesia Plan: General   Post-op Pain Management: Regional block*   Induction: Intravenous  PONV Risk Score and Plan: 2 and Ondansetron, Dexamethasone and Treatment may vary due to age or medical condition  Airway Management Planned: Oral ETT  Additional Equipment:   Intra-op Plan:   Post-operative Plan: Extubation in OR  Informed Consent: I have reviewed the patients History and Physical, chart, labs and discussed the procedure including the risks, benefits and alternatives for the proposed anesthesia with the patient or authorized representative who has indicated his/her understanding and acceptance.     Dental advisory given  Plan Discussed with: CRNA and Surgeon  Anesthesia Plan Comments: (See PAT note 03/27/2023)       Anesthesia Quick Evaluation

## 2023-03-30 NOTE — Telephone Encounter (Signed)
*  STAT* If patient is at the pharmacy, call can be transferred to refill team.   1. Which medications need to be refilled? (please list name of each medication and dose if known) amLODipine (NORVASC) 10 MG tablet    2. Would you like to learn more about the convenience, safety, & potential cost savings by using the [facility]? No     3. Are you open to using the [facility] (Type [facility]. No ).   4. Which pharmacy/location (including street and city if local pharmacy) is medication to be sent to?[facility] [address] [place], [place] - [address] AT [facility] [address]    5. Do they need a 30 day or 90 day supply? 90

## 2023-04-06 ENCOUNTER — Encounter (HOSPITAL_COMMUNITY): Payer: Self-pay | Admitting: Orthopedic Surgery

## 2023-04-06 NOTE — H&P (Signed)
Chief Complaint: left shoulder pain and weakness  HPI: Edward [name] is a 72 y.o. male who presents for evaluation of left shoulder pain and weakness. It has been present for greater than 9 months and has been worsening. He has failed conservative measures. Pain is rated as severe.MRI scan of the left shoulder showed a full-thickness left shoulder rotator cuff tear.  Past Medical History:  Diagnosis Date   Chicken pox    Coronary artery disease    a. 1999 s/p MI with cath/PCI;  b. 05/1999 Ex Cardiolite EF 68%, no ishcemia.   Dyslipidemia    Family history of breast cancer    Family history of oral cancer    History of tobacco abuse    Hypertension    Hypokalemia    Injury of cervical spine (HCC)    a. 02/2012 C4/5   Measles    Mumps    Myocardial infarction [facility])    Nasal bones, closed fracture    a. 02/2012 in setting of presyncope/fall   Near syncope    Pneumonia    Prostate cancer (HCC)    Whooping cough    Past Surgical History:  Procedure Laterality Date   ACHILLES TENDON SURGERY  1989   ANTERIOR CERVICAL DECOMP/DISCECTOMY FUSION  03/13/2012   Procedure: ANTERIOR CERVICAL DECOMPRESSION/DISCECTOMY FUSION 1 LEVEL;  Surgeon: Maeola Harman, MD;  [facility];  Service: Neurosurgery;  Laterality: N/A;  Anterior Cervical Decompression/Fusion. Cervical four-five.   CARDIAC CATHETERIZATION  06/28/1998   single vessel CAD involving the distal RCA/PTCA and stenting of the distal RCA//EF- 50-55%   CLOSED REDUCTION NASAL FRACTURE  03/13/2012   Procedure: CLOSED REDUCTION NASAL FRACTURE;  Surgeon: Wayland Denis, DO;  [facility];  Service: Plastics;  Laterality: N/A;  Internal and external splinting of nasal fracture   LYMPHADENECTOMY Bilateral 02/14/2018   Procedure: [facility];  Surgeon: Heloise Purpura, MD;  [facility];  Service: Urology;  Laterality: Bilateral;   NM MYOVIEW LTD  05/17/2009   normal stress nuclear study/no evidence of  ischemia/EF- 68%   petalla tendon surgery  1989   ROBOT ASSISTED LAPAROSCOPIC RADICAL PROSTATECTOMY N/A 02/14/2018   Procedure: XI ROBOTIC ASSISTED LAPAROSCOPIC RADICAL PROSTATECTOMY;  Surgeon: Heloise Purpura, MD;  [facility];  Service: Urology;  Laterality: N/A;   TOTAL KNEE ARTHROPLASTY Right 09/26/2019   Procedure: TOTAL KNEE ARTHROPLASTY;  Surgeon: Jodi Geralds, MD;  Location[facility];  Service: Orthopedics;  Laterality: Right;   Social History   Socioeconomic History   Marital status: Married    Spouse name: Not on file   Number of children: Not on file   Years of education: Not on file   Highest education level: Not on file  Occupational History   Occupation: Paramedic    Comment: Doctor, hospital  Tobacco Use   Smoking status: Former    Current packs/day: 0.00    Average packs/day: 1 pack/day for 20.0 years (20.0 ttl pk-yrs)    Types: Cigarettes    Start date: 11/01/1968    Quit date: 11/01/1988    Years since quitting: 34.4   Smokeless tobacco: Never  Vaping Use   Vaping status: Never Used  Substance and Sexual Activity   Alcohol use: Yes    Comment: occasional alcoholic beverage.   Drug use: No   Sexual activity: Yes  Other Topics Concern   Not on file  Social History Narrative   Married and lives with wife in [place].  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  Exercises 2-3 days/wk without limitations.   Social Determinants of Health   Financial Resource Strain: Not on file  Food Insecurity: No Food Insecurity (07/11/2022)   Hunger Vital Sign    Worried About Running Out of Food in the Last Year: Never true    Ran Out of Food in the Last Year: Never true  Transportation Needs: No Transportation Needs (07/11/2022)   PRAPARE - Administrator, Civil Service (Medical): No    Lack of Transportation (Non-Medical): No  Physical Activity: Not on file  Stress: Not on file  Social Connections: Not on file   Family History  Problem Relation Age of Onset    Heart attack Father        54   Breast cancer Mother        dx late 14s, d. 76   Cancer Sister        oral cancer dx 73   Breast cancer Daughter        dx. 83, currently being treated   No Known Allergies Prior to Admission medications   Medication Sig Start Date End Date Taking? Authorizing Provider  aspirin 81 MG chewable tablet Chew 1 tablet by mouth daily in the afternoon. 09/27/19  Yes [provider]  atorvastatin (LIPITOR) 40 MG tablet Take 1 tablet (40 mg total) by mouth daily. 03/06/23  Yes Nahser, Deloris Ping, MD  cholecalciferol (VITAMIN D) 1000 units tablet Take 1,000 Units by mouth daily.   Yes [provider]  docusate sodium (COLACE) 100 MG capsule Take 100 mg by mouth daily as needed for moderate constipation.   Yes [provider]  ferrous sulfate 325 (65 FE) MG tablet Take 325 mg by mouth daily with breakfast.   Yes [provider]  Magnesium 250 MG TABS Take 250 mg by mouth daily.   Yes [provider]  meloxicam (MOBIC) 15 MG tablet Take 15 mg by mouth daily. 03/12/23  Yes [provider]  Multiple Vitamin (MULTIVITAMIN) tablet Take 1 tablet by mouth daily.   Yes [provider]  nitroGLYCERIN (NITROSTAT) 0.4 MG SL tablet Dissolve 1 tablet under the tongue every 5 minutes as needed for chest pain. Max of 3 doses, then 911. 01/16/23  Yes Nahser, Deloris Ping, MD  Omega-3 Fatty Acids (FISH OIL) 1000 MG CAPS Take 1,000 mg by mouth daily.    Yes [provider]  oxyCODONE-acetaminophen (PERCOCET/ROXICET) 5-325 MG tablet Take 1 tablet by mouth every 6 (six) hours as needed for severe pain. 01/17/22  Yes Leaphart, Lynann Beaver, PA-C  vitamin B-12 (CYANOCOBALAMIN) 1000 MCG tablet Take 1,000 mcg by mouth daily.   Yes [provider]  amLODipine (NORVASC) 10 MG tablet Take 1 tablet (10 mg total) by mouth daily. 03/30/23   Nahser, Deloris Ping, MD     Positive ROS: positive for joint pains  All other systems have been  reviewed and were otherwise negative with the exception of those mentioned in the HPI and as above.  Physical Exam: There were no vitals filed for this visit.  General: Alert, no acute distress Cardiovascular: No pedal edema Respiratory: No cyanosis, no use of accessory musculature GI: No organomegaly, abdomen is soft and non-tender Skin: No lesions in the area of chief complaint Neurologic: Sensation intact distally Psychiatric: Patient is competent for consent with normal mood and affect Lymphatic: No axillary or cervical lymphadenopathy  MUSCULOSKELETAL: left shoulder exam:Shoulder has extreme weakness in external rotation.  Actively he cannot  lift the arm at all.  Passively I can fully raise the arm and there is mild pain with that.  Assessment/Plan: LEFT SHOULDER ROTATOR CUFF TEAR Plan for Procedure(s):left SHOULDER ARTHROSCOPY WITH MINI OPEN ROTATOR CUFF REPAIR Risks and benefits of the surgical procedure were explained to the patient.  He is ready to move forward with left shoulder arthroscopy with mini open rotator cuff repair on 04/09/23.

## 2023-04-09 ENCOUNTER — Other Ambulatory Visit: Payer: Self-pay

## 2023-04-09 ENCOUNTER — Ambulatory Visit (HOSPITAL_COMMUNITY): Payer: Federal, State, Local not specified - PPO | Admitting: Certified Registered Nurse Anesthetist

## 2023-04-09 ENCOUNTER — Encounter (HOSPITAL_COMMUNITY): Payer: Self-pay | Admitting: Orthopedic Surgery

## 2023-04-09 ENCOUNTER — Encounter (HOSPITAL_COMMUNITY)
Admission: RE | Disposition: A | Discharge status: Home / Self Care | Payer: Self-pay | Source: Ambulatory Visit | Attending: Orthopedic Surgery

## 2023-04-09 ENCOUNTER — Ambulatory Visit (HOSPITAL_COMMUNITY): Payer: Federal, State, Local not specified - PPO | Admitting: Physician Assistant

## 2023-04-09 ENCOUNTER — Ambulatory Visit (HOSPITAL_COMMUNITY)
Admission: RE | Admit: 2023-04-09 | Discharge: 2023-04-09 | Disposition: A | Discharge status: Home / Self Care | Payer: Federal, State, Local not specified - PPO | Source: Ambulatory Visit | Attending: Orthopedic Surgery | Admitting: Orthopedic Surgery

## 2023-04-09 DIAGNOSIS — I2510 Atherosclerotic heart disease of native coronary artery without angina pectoris: Secondary | ICD-10-CM | POA: Insufficient documentation

## 2023-04-09 DIAGNOSIS — Z87891 Personal history of nicotine dependence: Secondary | ICD-10-CM | POA: Diagnosis not present

## 2023-04-09 DIAGNOSIS — M25812 Other specified joint disorders, left shoulder: Secondary | ICD-10-CM | POA: Diagnosis present

## 2023-04-09 DIAGNOSIS — I10 Essential (primary) hypertension: Secondary | ICD-10-CM | POA: Diagnosis not present

## 2023-04-09 DIAGNOSIS — M19012 Primary osteoarthritis, left shoulder: Secondary | ICD-10-CM | POA: Diagnosis present

## 2023-04-09 DIAGNOSIS — I252 Old myocardial infarction: Secondary | ICD-10-CM | POA: Diagnosis not present

## 2023-04-09 DIAGNOSIS — M75102 Unspecified rotator cuff tear or rupture of left shoulder, not specified as traumatic: Secondary | ICD-10-CM | POA: Diagnosis not present

## 2023-04-09 DIAGNOSIS — S46112A Strain of muscle, fascia and tendon of long head of biceps, left arm, initial encounter: Secondary | ICD-10-CM | POA: Diagnosis present

## 2023-04-09 DIAGNOSIS — M75122 Complete rotator cuff tear or rupture of left shoulder, not specified as traumatic: Secondary | ICD-10-CM | POA: Diagnosis present

## 2023-04-09 HISTORY — PX: SHOULDER ARTHROSCOPY WITH OPEN ROTATOR CUFF REPAIR: SHX6092

## 2023-04-09 SURGERY — ARTHROSCOPY, SHOULDER WITH REPAIR, ROTATOR CUFF, OPEN
Anesthesia: General | Site: Shoulder | Laterality: Left

## 2023-04-09 MED ORDER — EPINEPHRINE PF 1 MG/ML IJ SOLN
INTRAMUSCULAR | Status: AC
  Filled 2023-04-09: qty 1

## 2023-04-09 MED ORDER — PHENYLEPHRINE HCL-NACL 20-0.9 MG/250ML-% IV SOLN
INTRAVENOUS | Status: DC | PRN
Start: 2023-04-09 — End: 2023-04-09
  Administered 2023-04-09: 60 ug/min via INTRAVENOUS

## 2023-04-09 MED ORDER — LIDOCAINE HCL (CARDIAC) PF 100 MG/5ML IV SOSY
PREFILLED_SYRINGE | INTRAVENOUS | Status: DC | PRN
  Administered 2023-04-09: 100 mg via INTRAVENOUS

## 2023-04-09 MED ORDER — CEFAZOLIN SODIUM-DEXTROSE 2-4 GM/100ML-% IV SOLN
2.0000 g | INTRAVENOUS | Status: AC
  Administered 2023-04-09: 2 g via INTRAVENOUS
  Filled 2023-04-09: qty 100

## 2023-04-09 MED ORDER — OXYCODONE HCL 5 MG PO TABS: 5.0000 mg | ORAL_TABLET | Freq: Once | ORAL | Status: DC | PRN

## 2023-04-09 MED ORDER — BUPIVACAINE LIPOSOME 1.3 % IJ SUSP
INTRAMUSCULAR | Status: DC | PRN
Start: 2023-04-09 — End: 2023-04-09
  Administered 2023-04-09: 10 mL via PERINEURAL

## 2023-04-09 MED ORDER — TIZANIDINE HCL 2 MG PO TABS: 2.0000 mg | ORAL_TABLET | Freq: Three times a day (TID) | ORAL | 0 refills | Status: DC | PRN

## 2023-04-09 MED ORDER — SODIUM CHLORIDE 0.9 % IR SOLN
Status: DC | PRN
Start: 2023-04-09 — End: 2023-04-09
  Administered 2023-04-09: 3000 mL

## 2023-04-09 MED ORDER — CHLORHEXIDINE GLUCONATE 0.12 % MT SOLN
15.0000 mL | Freq: Once | OROMUCOSAL | Status: AC
  Administered 2023-04-09: 15 mL via OROMUCOSAL

## 2023-04-09 MED ORDER — ROCURONIUM BROMIDE 10 MG/ML (PF) SYRINGE
PREFILLED_SYRINGE | INTRAVENOUS | Status: AC
  Filled 2023-04-09: qty 10

## 2023-04-09 MED ORDER — LIDOCAINE HCL (PF) 2 % IJ SOLN
INTRAMUSCULAR | Status: AC
  Filled 2023-04-09: qty 5

## 2023-04-09 MED ORDER — ONDANSETRON HCL 4 MG/2ML IJ SOLN
INTRAMUSCULAR | Status: AC
  Filled 2023-04-09: qty 2

## 2023-04-09 MED ORDER — ONDANSETRON HCL 4 MG/2ML IJ SOLN
INTRAMUSCULAR | Status: DC | PRN
  Administered 2023-04-09: 4 mg via INTRAVENOUS

## 2023-04-09 MED ORDER — PHENYLEPHRINE HCL (PRESSORS) 10 MG/ML IV SOLN
INTRAVENOUS | Status: DC | PRN
Start: 2023-04-09 — End: 2023-04-09
  Administered 2023-04-09: 80 ug via INTRAVENOUS
  Administered 2023-04-09 (×3): 160 ug via INTRAVENOUS

## 2023-04-09 MED ORDER — ORAL CARE MOUTH RINSE: 15.0000 mL | Freq: Once | OROMUCOSAL | Status: AC

## 2023-04-09 MED ORDER — SUGAMMADEX SODIUM 200 MG/2ML IV SOLN
INTRAVENOUS | Status: DC | PRN
Start: 2023-04-09 — End: 2023-04-09
  Administered 2023-04-09: 200 mg via INTRAVENOUS

## 2023-04-09 MED ORDER — OXYCODONE HCL 5 MG/5ML PO SOLN: 5.0000 mg | Freq: Once | ORAL | Status: DC | PRN

## 2023-04-09 MED ORDER — ONDANSETRON HCL 4 MG/2ML IJ SOLN: 4.0000 mg | Freq: Once | INTRAMUSCULAR | Status: DC | PRN

## 2023-04-09 MED ORDER — LACTATED RINGERS IV SOLN: INTRAVENOUS | Status: DC

## 2023-04-09 MED ORDER — PROPOFOL 10 MG/ML IV BOLUS
INTRAVENOUS | Status: DC | PRN
Start: 2023-04-09 — End: 2023-04-09
  Administered 2023-04-09: 150 mg via INTRAVENOUS

## 2023-04-09 MED ORDER — PHENYLEPHRINE HCL (PRESSORS) 10 MG/ML IV SOLN
INTRAVENOUS | Status: AC
  Filled 2023-04-09: qty 1

## 2023-04-09 MED ORDER — FENTANYL CITRATE PF 50 MCG/ML IJ SOSY: 25.0000 ug | PREFILLED_SYRINGE | INTRAMUSCULAR | Status: DC | PRN

## 2023-04-09 MED ORDER — DEXAMETHASONE SODIUM PHOSPHATE 10 MG/ML IJ SOLN
INTRAMUSCULAR | Status: DC | PRN
  Administered 2023-04-09: 5 mg via INTRAVENOUS

## 2023-04-09 MED ORDER — SODIUM CHLORIDE 0.9 % IR SOLN
Status: DC | PRN
  Administered 2023-04-09: 3000 mL

## 2023-04-09 MED ORDER — FENTANYL CITRATE PF 50 MCG/ML IJ SOSY
100.0000 ug | PREFILLED_SYRINGE | Freq: Once | INTRAMUSCULAR | Status: AC
  Administered 2023-04-09: 50 ug via INTRAVENOUS
  Filled 2023-04-09: qty 2

## 2023-04-09 MED ORDER — DEXAMETHASONE SODIUM PHOSPHATE 10 MG/ML IJ SOLN
INTRAMUSCULAR | Status: AC
  Filled 2023-04-09: qty 1

## 2023-04-09 MED ORDER — ROCURONIUM BROMIDE 100 MG/10ML IV SOLN
INTRAVENOUS | Status: DC | PRN
  Administered 2023-04-09: 60 mg via INTRAVENOUS

## 2023-04-09 MED ORDER — OXYCODONE-ACETAMINOPHEN 5-325 MG PO TABS
1.0000 | ORAL_TABLET | Freq: Four times a day (QID) | ORAL | 0 refills | Status: DC | PRN
Start: 2023-04-09 — End: 2023-10-30

## 2023-04-09 MED ORDER — MIDAZOLAM HCL 2 MG/2ML IJ SOLN
2.0000 mg | Freq: Once | INTRAMUSCULAR | Status: DC
  Filled 2023-04-09: qty 2

## 2023-04-09 MED ORDER — BUPIVACAINE HCL (PF) 0.5 % IJ SOLN
INTRAMUSCULAR | Status: DC | PRN
Start: 2023-04-09 — End: 2023-04-09
  Administered 2023-04-09: 17 mL via PERINEURAL

## 2023-04-09 MED ORDER — ACETAMINOPHEN 10 MG/ML IV SOLN: 1000.0000 mg | Freq: Once | INTRAVENOUS | Status: DC | PRN

## 2023-04-09 SURGICAL SUPPLY — 73 items
AID PSTN UNV HD RSTRNT DISP (MISCELLANEOUS) ×1
APL SKNCLS STERI-STRIP NONHPOA (GAUZE/BANDAGES/DRESSINGS)
BAG COUNTER SPONGE SURGICOUNT (BAG) IMPLANT
BAG SPNG CNTER NS LX DISP (BAG)
BENZOIN TINCTURE PRP APPL 2/3 (GAUZE/BANDAGES/DRESSINGS) IMPLANT
BLADE EXCALIBUR 4.0X13 (MISCELLANEOUS) ×1 IMPLANT
BLADE SURG 15 STRL LF DISP TIS (BLADE) IMPLANT
BLADE SURG 15 STRL SS (BLADE)
BLADE VORTEX 6.0 (BLADE) ×1 IMPLANT
BOOTIES KNEE HIGH SLOAN (MISCELLANEOUS) ×2 IMPLANT
BURR OVAL 8 FLU 5.0X13 (MISCELLANEOUS) IMPLANT
DRAPE IMP U-DRAPE 54X76 (DRAPES) ×2 IMPLANT
DRAPE INCISE IOBAN 66X45 STRL (DRAPES) ×1 IMPLANT
DRAPE ORTHO SPLIT 77X108 STRL (DRAPES) ×2
DRAPE STERI 35X30 U-POUCH (DRAPES) ×1 IMPLANT
DRAPE SURG 17X23 STRL (DRAPES) ×1 IMPLANT
DRAPE SURG ORHT 6 SPLT 77X108 (DRAPES) ×2 IMPLANT
DRAPE U-SHAPE 47X51 STRL (DRAPES) ×1 IMPLANT
DRSG EMULSION OIL 3X16 NADH (GAUZE/BANDAGES/DRESSINGS) IMPLANT
DRSG EMULSION OIL 3X3 NADH (GAUZE/BANDAGES/DRESSINGS) ×1 IMPLANT
DURAPREP 26ML APPLICATOR (WOUND CARE) ×1 IMPLANT
DW OUTFLOW CASSETTE/TUBE SET (MISCELLANEOUS) ×1 IMPLANT
ELECT REM PT RETURN 15FT ADLT (MISCELLANEOUS) ×1 IMPLANT
GAUZE PAD ABD 8X10 STRL (GAUZE/BANDAGES/DRESSINGS) ×2 IMPLANT
GAUZE SPONGE 4X4 12PLY STRL (GAUZE/BANDAGES/DRESSINGS) ×1 IMPLANT
GLOVE BIOGEL PI IND STRL 8 (GLOVE) ×2 IMPLANT
GLOVE ECLIPSE 7.5 STRL STRAW (GLOVE) ×2 IMPLANT
GOWN STRL REUS W/ TWL XL LVL3 (GOWN DISPOSABLE) ×2 IMPLANT
GOWN STRL REUS W/TWL XL LVL3 (GOWN DISPOSABLE) ×2
KIT BASIN OR (CUSTOM PROCEDURE TRAY) ×1 IMPLANT
KIT TURNOVER KIT A (KITS) IMPLANT
MANIFOLD NEPTUNE II (INSTRUMENTS) ×1 IMPLANT
NDL 1/2 CIR CATGUT .05X1.09 (NEEDLE) IMPLANT
NDL SAFETY ECLIP 18X1.5 (MISCELLANEOUS) ×1 IMPLANT
NDL SCORPION MULTI FIRE (NEEDLE) IMPLANT
NDL SUT 6 .5 CRC .975X.05 MAYO (NEEDLE) IMPLANT
NEEDLE 1/2 CIR CATGUT .05X1.09 (NEEDLE) IMPLANT
NEEDLE MAYO TAPER (NEEDLE)
NEEDLE SCORPION MULTI FIRE (NEEDLE) IMPLANT
NS IRRIG 1000ML POUR BTL (IV SOLUTION) IMPLANT
PACK ARTHROSCOPY WL (CUSTOM PROCEDURE TRAY) ×1 IMPLANT
PAD ARMBOARD 7.5X6 YLW CONV (MISCELLANEOUS) ×1 IMPLANT
PASSER SUT SWANSON 36MM LOOP (INSTRUMENTS) IMPLANT
PENCIL SMOKE EVACUATOR (MISCELLANEOUS) IMPLANT
PORT APPOLLO RF 90DEGREE MULTI (SURGICAL WAND) ×1 IMPLANT
RESTRAINT HEAD UNIVERSAL NS (MISCELLANEOUS) ×1 IMPLANT
SET IRRIG Y TYPE TUR BLADDER L (SET/KITS/TRAYS/PACK) ×1 IMPLANT
SLING ARM FOAM STRAP LRG (SOFTGOODS) IMPLANT
SLING ARM FOAM STRAP MED (SOFTGOODS) IMPLANT
SLING ARM FOAM STRAP XLG (SOFTGOODS) IMPLANT
SLING ARM IMMOBILIZER LRG (SOFTGOODS) IMPLANT
SPIKE FLUID TRANSFER (MISCELLANEOUS) IMPLANT
SPONGE T-LAP 4X18 ~~LOC~~+RFID (SPONGE) IMPLANT
SUCTION TUBE FRAZIER 10FR DISP (SUCTIONS) IMPLANT
SUT ETHILON 4 0 PS 2 18 (SUTURE) IMPLANT
SUT MNCRL AB 3-0 PS2 18 (SUTURE) IMPLANT
SUT VIC AB 0 CT1 27 (SUTURE)
SUT VIC AB 0 CT1 27XBRD ANBCTR (SUTURE) IMPLANT
SUT VIC AB 0 CT1 27XBRD ANTBC (SUTURE) IMPLANT
SUT VIC AB 1 CT1 27 (SUTURE)
SUT VIC AB 1 CT1 27XBRD ANBCTR (SUTURE) IMPLANT
SUT VIC AB 1 CT1 36 (SUTURE) IMPLANT
SUT VIC AB 2-0 CT1 27 (SUTURE)
SUT VIC AB 2-0 CT1 TAPERPNT 27 (SUTURE) IMPLANT
SUT VIC AB 2-0 SH 27 (SUTURE)
SUT VIC AB 2-0 SH 27XBRD (SUTURE) IMPLANT
SYR 3ML LL SCALE MARK (SYRINGE) ×1 IMPLANT
TAPE FIBER 2MM 7IN #2 BLUE (SUTURE) IMPLANT
TOWEL OR 17X26 10 PK STRL BLUE (TOWEL DISPOSABLE) ×2 IMPLANT
TUBING ARTHROSCOPY IRRIG 16FT (MISCELLANEOUS) ×1 IMPLANT
TUBING CONNECTING 10 (TUBING) ×1 IMPLANT
WAND ABLATOR APOLLO I90 (BUR) IMPLANT
YANKAUER SUCT BULB TIP NO VENT (SUCTIONS) IMPLANT

## 2023-04-09 NOTE — Anesthesia Procedure Notes (Signed)
Anesthesia Regional Block: Interscalene brachial plexus block   Pre-Anesthetic Checklist: , timeout performed,  Correct Patient, Correct Site, Correct Laterality,  Correct Procedure, Correct Position, site marked,  Risks and benefits discussed,  Surgical consent,  Pre-op evaluation,  At surgeon's request and post-op pain management  Laterality: Left  Prep: chloraprep       Needles:  Injection technique: Single-shot  Needle Type: Echogenic Stimulator Needle     Needle Length: 9cm      Additional Needles:   Procedures:,,,, ultrasound used (permanent image in chart),,     Nerve Stimulator or Paresthesia:  Response: 0.45 mA  Additional Responses:   Narrative:  Start time: 04/09/2023 11:59 AM End time: 04/09/2023 12:09 PM Injection made incrementally with aspirations every 5 mL.  Performed by: Personally  Anesthesiologist: Eilene Ghazi, MD  Additional Notes: Patient tolerated the procedure well without complications

## 2023-04-09 NOTE — Anesthesia Procedure Notes (Signed)
Procedure Name: Intubation Date/Time: 04/09/2023 12:47 PM  Performed by: Cleda Clarks, CRNAPre-anesthesia Checklist: Patient identified, Emergency Drugs available, Suction available and Patient being monitored Patient Re-evaluated:Patient Re-evaluated prior to induction Oxygen Delivery Method: Circle system utilized Preoxygenation: Pre-oxygenation with 100% oxygen Induction Type: IV induction Ventilation: Mask ventilation without difficulty Laryngoscope Size: Miller and 2 Grade View: Grade II Tube type: Oral Tube size: 7.0 mm Number of attempts: 1 Airway Equipment and Method: Stylet and Oral airway Placement Confirmation: ETT inserted through vocal cords under direct vision, positive ETCO2 and breath sounds checked- equal and bilateral Secured at: 21 cm Tube secured with: Tape Dental Injury: Teeth and Oropharynx as per pre-operative assessment

## 2023-04-09 NOTE — Op Note (Addendum)
NAME: Edward [name], GIKAS MEDICAL RECORD NO: [id number] ACCOUNT NO: [id number] DATE OF BIRTH: [birth year] FACILITY: [facility] LOCATION: [facility] PHYSICIAN: Harvie Junior, MD  Operative Report   DATE OF PROCEDURE: 04/09/2023  PREOPERATIVE DIAGNOSES: 1.  Impingement left Shoulder. 2.  AC joint arthritis left Shoulder. 3.  Chronic rotator cuff tear left Shoulder.  POSTOPERATIVE DIAGNOSES: 1.  Impingement left Shoulder. 2.  AC joint arthritis left Shoulder. 3.  Chronic rotator cuff tear Left Shoulder. 4.  Superior labral tear anterior to posterior with detachment of the superior labrum Left shoulder. 5.  Irreparable rotator cuff tear Shoulder.  PROCEDURE: 1.  Arthroscopic subacromial decompression Left shoulder. 2.  Arthroscopic distal clavicle resection from the anterior compartment over 20 mm Left shoulder. 3.  Arthroscopic extensive debridement of the superior labrum anterior to posterior that is anterior labrum, superior labrum and posterior labrum Left shoulder. 4.  Debridement of the glenoid and extensive debridement of the torn edges of the rotator cuff tear Left shoulder. 5. Biceps tenotomy Left shoulder.  SURGEON:  Harvie Junior, MD.  ASSISTANT:  Gus Puma, PA-C who was present entire case and assisted by retraction of the arm, manipulation of the arm and closing to minimize OR time.  BRIEF HISTORY:  The patient is a 72 year old male with a long history significant complaints of left shoulder pain.  He has been treated for a prolonged period of time with conservative care including activity modification, physical therapy, medication  and injection therapy.  He had failed all these things and had MRI showing chronic retracted rotator cuff tear.  We were concerned that we would not be able to repair this cuff, but felt that debridement may be beneficial to him and he was brought to the  operating room for this procedure.  DESCRIPTION OF PROCEDURE:  The patient was brought to the  operating room.  After adequate anesthesia was obtained with a general anesthetic, the patient was placed supine on the operating table and then moved into beach chair position.  All bony  prominences well padded.  Attention was then turned to the left shoulder.  After routine prep and drape arthroscopy was performed that showed that there was chronic and significant tear of the rotator cuff with retraction just shy of the glenoid.  We  took a spatula and mobilized the rotator cuff from the undersurface and the superior surface and then took a grasper and really could not budge the cuff at this point.  He was very concerned that at that point, so we basically took a look in the  glenohumeral joint.  There was mild to moderate arthritic changes.  We debrided this back to a smooth stable rim of articular cartilage.  Biceps tendon actually looked to be well anchored to the superior labrum that is why we put the arm through a range  of motion, it became clear the superior labrum was not well attached, at that point, I felt that he needed a biceps tenotomy.  So through the anterior portal, a shaver and scissors were used to release the biceps tendon.  Once this was done, attention  was turned to that superior labrum, which was obviously torn and catching in the joint, so we did a debridement of the superior labrum, posterior labrum and anterior labrum.  Once this was done, attention was turned to the subacromial space.  We did not  have to leave the glenohumeral joint as the tear was retracted following up that we basically looking at the acromion.  At that point Arthrocare wand was used to take all the periosteum off the acromion and really off of the distal clavicle.  At this  point, the motorized bur was used to resect 20 mm of the distal clavicle.  We also resected, the undersurface of the acromion.  Once this was done, the Arthrocare wand was used to ablate the bony surfaces of the acromioplasty and distal  clavicle  resection.  Attention was then turned back to the edge of the rotator cuff, which were debrided getting out all the duplication and thickness of the edges of the cuff.  There did appear to be some subscapularis attached in the anterior portion and some  infraspinatus attached and maybe teres minor attached posteriorly.  Once this shoulder was debrided and the biceps had been released the shoulder was copiously and thoroughly irrigated and suctioned dry. Prior to that extensive debridement was taken down  to the cuff insertion over laterally as well as the cuff edges towards the glenoid.  Once this was all done, the shoulder was again debrided with a suction shaver back to a smooth and stable rim of remaining cuff and the irrigation was completed,  suctioned dry and instruments were removed from the shoulder.  Sterile compressive dressing was applied as well as a shoulder sling.  The patient was taken to recovery was noted to be in satisfactory condition.  Of note, Gus Puma was present for the  entire case and assisted by retraction of the arm and closing to minimize OR time.   PUS D: 04/09/2023 2:24:23 pm T: 04/09/2023 4:48:00 pm  JOB: [id number]/ [id number]

## 2023-04-09 NOTE — Interval H&P Note (Signed)
History and Physical Interval Note:  04/09/2023 12:00 PM  Edward [name]  has presented today for surgery, with the diagnosis of LEFT SHOULDER ROTATOR CUFF TEAR.  The various methods of treatment have been discussed with the patient and family. After consideration of risks, benefits and other options for treatment, the patient has consented to  Procedure(s): SHOULDER ARTHROSCOPY WITH MINI OPEN ROTATOR CUFF REPAIR (Left) as a surgical intervention.  The patient's history has been reviewed, patient examined, no change in status, stable for surgery.  I have reviewed the patient's chart and labs.  Questions were answered to the patient's satisfaction.     Harvie Junior

## 2023-04-09 NOTE — Brief Op Note (Signed)
04/09/2023  2:17 PM  PATIENT:  Edward [name]  72 y.o. male  PRE-OPERATIVE DIAGNOSIS:  LEFT SHOULDER ROTATOR CUFF TEAR  POST-OPERATIVE DIAGNOSIS:  LEFT SHOULDER ROTATOR CUFF TEAR  PROCEDURE:  Procedure(s): SHOULDER ARTHROSCOPY WITH SUBACROMIAL DECOMPRESSION, DISTAL CLAVICAL RESECTION, BICEPS TENOTOMY, EXTENSIVE DEBRIDMENT OF ROTATOR CUFF TEAR (Left)  SURGEON:  Surgeons and Role:    Jodi Geralds, MD - Primary  PHYSICIAN ASSISTANT:   ASSISTANTS: jim bethune   ANESTHESIA:   general  EBL:  20 mL   BLOOD ADMINISTERED:none  DRAINS: none   LOCAL MEDICATIONS USED:  MARCAINE     SPECIMEN:  No Specimen  DISPOSITION OF SPECIMEN:  N/A  COUNTS:  OK  TOURNIQUET:  * No tourniquets in log *  DICTATION: .Other Dictation: Dictation Number [id number]  PLAN OF CARE: Discharge to home after PACU  PATIENT DISPOSITION:  PACU - hemodynamically stable.   Delay start of Pharmacological VTE agent (>24hrs) due to surgical blood loss or risk of bleeding: no

## 2023-04-09 NOTE — Transfer of Care (Signed)
Immediate Anesthesia Transfer of Care Note  Patient: Edward [name]  Procedure(s) Performed: SHOULDER ARTHROSCOPY WITH SUBACROMIAL DECOMPRESSION, DISTAL CLAVICAL RESECTION, BICEPS TENOTOMY, EXTENSIVE DEBRIDMENT OF ROTATOR CUFF TEAR (Left: Shoulder)  Patient Location: PACU  Anesthesia Type:GA combined with regional for post-op pain  Level of Consciousness: awake, alert , and oriented  Airway & Oxygen Therapy: Patient Spontanous Breathing and Patient connected to face mask oxygen  Post-op Assessment: Report given to RN and Post -op Vital signs reviewed and stable  Post vital signs: Reviewed and stable  Last Vitals:  Vitals Value Taken Time  BP    Temp    Pulse    Resp    SpO2      Last Pain:  Vitals:   04/09/23 1230  TempSrc:   PainSc: 0-No pain         Complications: No notable events documented.

## 2023-04-09 NOTE — Discharge Instructions (Signed)
Discharge Instructions after Arthroscopic Shoulder Repair ° ° °A sling has been provided for you. Remain in your sling at all times. This includes sleeping in your sling.  °Use ice on the shoulder intermittently over the first 48 hours after surgery.  °Pain medicine has been prescribed for you.  °Use your medicine liberally over the first 48 hours, and then you can begin to taper your use. You may take Extra Strength Tylenol or Tylenol only in place of the pain pills. DO NOT take ANY nonsteroidal anti-inflammatory pain medications: Advil, Motrin, Ibuprofen, Aleve, Naproxen, or Narprosyn.  °You may remove your dressing after two days. If the incision sites are still moist, place a Band-Aid over the moist site(s). Change Band-Aids daily until dry.  °You may shower 5 days after surgery. The incisions CANNOT get wet prior to 5 days. Simply allow the water to wash over the site and then pat dry. Do not rub the incisions. Make sure your axilla (armpit) is completely dry after showering.  °Take one aspirin a day for 2 weeks after surgery, unless you have an aspirin sensitivity/ allergy or asthma. ° ° °Please call [phone] during normal business hours or [phone] after hours for any problems. Including the following: ° °- excessive redness of the incisions °- drainage for more than 4 days °- fever of more than 101.5 F ° °*Please note that pain medications will not be refilled after hours or on weekends. ° ° ° °

## 2023-04-09 NOTE — Anesthesia Procedure Notes (Signed)
Anesthesia Procedure Image    

## 2023-04-10 ENCOUNTER — Encounter (HOSPITAL_COMMUNITY): Payer: Self-pay | Admitting: Orthopedic Surgery

## 2023-04-10 NOTE — Anesthesia Postprocedure Evaluation (Signed)
Anesthesia Post Note  Patient: Edward [name]  Procedure(s) Performed: SHOULDER ARTHROSCOPY WITH SUBACROMIAL DECOMPRESSION, DISTAL CLAVICAL RESECTION, BICEPS TENOTOMY, EXTENSIVE DEBRIDMENT OF ROTATOR CUFF TEAR (Left: Shoulder)     Patient location during evaluation: PACU Anesthesia Type: General Level of consciousness: awake and alert Pain management: pain level controlled Vital Signs Assessment: post-procedure vital signs reviewed and stable Respiratory status: spontaneous breathing, nonlabored ventilation, respiratory function stable and patient connected to nasal cannula oxygen Cardiovascular status: blood pressure returned to baseline and stable Postop Assessment: no apparent nausea or vomiting Anesthetic complications: no  No notable events documented.  Last Vitals:  Vitals:   04/09/23 1600 04/09/23 1615  BP: (!) 143/94 (!) 146/91  Pulse: 90 88  Resp: 17 16  Temp:    SpO2: 94% 97%    Last Pain:  Vitals:   04/09/23 1615  TempSrc:   PainSc: 0-No pain                 Hanni Milford S

## 2023-04-27 ENCOUNTER — Emergency Department (HOSPITAL_BASED_OUTPATIENT_CLINIC_OR_DEPARTMENT_OTHER): Payer: Federal, State, Local not specified - PPO

## 2023-04-27 ENCOUNTER — Other Ambulatory Visit: Payer: Self-pay

## 2023-04-27 ENCOUNTER — Encounter (HOSPITAL_BASED_OUTPATIENT_CLINIC_OR_DEPARTMENT_OTHER): Payer: Self-pay

## 2023-04-27 ENCOUNTER — Emergency Department (HOSPITAL_BASED_OUTPATIENT_CLINIC_OR_DEPARTMENT_OTHER)
Admission: EM | Admit: 2023-04-27 | Discharge: 2023-04-27 | Disposition: A | Discharge status: Home / Self Care | Payer: Federal, State, Local not specified - PPO | Attending: Emergency Medicine | Admitting: Emergency Medicine

## 2023-04-27 DIAGNOSIS — K5641 Fecal impaction: Secondary | ICD-10-CM | POA: Diagnosis not present

## 2023-04-27 DIAGNOSIS — Z79899 Other long term (current) drug therapy: Secondary | ICD-10-CM | POA: Insufficient documentation

## 2023-04-27 DIAGNOSIS — I10 Essential (primary) hypertension: Secondary | ICD-10-CM | POA: Diagnosis not present

## 2023-04-27 DIAGNOSIS — R7401 Elevation of levels of liver transaminase levels: Secondary | ICD-10-CM | POA: Insufficient documentation

## 2023-04-27 DIAGNOSIS — R109 Unspecified abdominal pain: Secondary | ICD-10-CM | POA: Diagnosis present

## 2023-04-27 DIAGNOSIS — Z7982 Long term (current) use of aspirin: Secondary | ICD-10-CM | POA: Diagnosis not present

## 2023-04-27 LAB — COMPREHENSIVE METABOLIC PANEL
ALT: 48 U/L — ABNORMAL HIGH (ref 0–44)
AST: 37 U/L (ref 15–41)
Albumin: 4.2 g/dL (ref 3.5–5.0)
Alkaline Phosphatase: 74 U/L (ref 38–126)
Anion gap: 10 (ref 5–15)
BUN: 17 mg/dL (ref 8–23)
CO2: 24 mmol/L (ref 22–32)
Calcium: 8.9 mg/dL (ref 8.9–10.3)
Chloride: 105 mmol/L (ref 98–111)
Creatinine, Ser: 0.93 mg/dL (ref 0.61–1.24)
GFR, Estimated: >60 mL/min (ref >60)
Glucose, Bld: 94 mg/dL (ref 70–99)
Potassium: 3.2 mmol/L — ABNORMAL LOW (ref 3.5–5.1)
Sodium: 139 mmol/L (ref 135–145)
Total Bilirubin: 0.8 mg/dL (ref 0.3–1.2)
Total Protein: 7 g/dL (ref 6.5–8.1)

## 2023-04-27 LAB — CBC
HCT: 38.1 % — ABNORMAL LOW (ref 39.0–52.0)
Hemoglobin: 13.4 g/dL (ref 13.0–17.0)
MCH: 31.5 pg (ref 26.0–34.0)
MCHC: 35.2 g/dL (ref 30.0–36.0)
MCV: 89.4 fL (ref 80.0–100.0)
Platelets: 280 10*3/uL (ref 150–400)
RBC: 4.26 MIL/uL (ref 4.22–5.81)
RDW: 12.7 % (ref 11.5–15.5)
WBC: 5.1 10*3/uL (ref 4.0–10.5)
nRBC: 0 % (ref 0.0–0.2)

## 2023-04-27 LAB — URINALYSIS, ROUTINE W REFLEX MICROSCOPIC
Bilirubin Urine: NEGATIVE
Glucose, UA: NEGATIVE mg/dL
Hgb urine dipstick: NEGATIVE
Ketones, ur: NEGATIVE mg/dL
Leukocytes,Ua: NEGATIVE
Nitrite: NEGATIVE
Protein, ur: NEGATIVE mg/dL
Specific Gravity, Urine: 1.01 (ref 1.005–1.030)
pH: 7.5 (ref 5.0–8.0)

## 2023-04-27 LAB — LIPASE, BLOOD: Lipase: 33 U/L (ref 11–51)

## 2023-04-27 MED ORDER — LACTATED RINGERS IV BOLUS
500.0000 mL | Freq: Once | INTRAVENOUS | Status: AC
  Administered 2023-04-27: 500 mL via INTRAVENOUS

## 2023-04-27 MED ORDER — POTASSIUM CHLORIDE CRYS ER 20 MEQ PO TBCR
40.0000 meq | EXTENDED_RELEASE_TABLET | Freq: Once | ORAL | Status: AC
  Administered 2023-04-27: 40 meq via ORAL
  Filled 2023-04-27: qty 2

## 2023-04-27 MED ORDER — IOHEXOL 300 MG/ML  SOLN
100.0000 mL | Freq: Once | INTRAMUSCULAR | Status: AC | PRN
  Administered 2023-04-27: 100 mL via INTRAVENOUS

## 2023-04-27 NOTE — ED Provider Notes (Signed)
[facility] AT [facility] Provider Note   CSN: [id number] Arrival date & time: 04/27/23  1548     History {Add pertinent medical, surgical, social history, OB history to HPI:1} Chief Complaint  Patient presents with   Constipation   Abdominal Pain    Edward [name] is a 72 y.o. male.   Constipation Associated symptoms: abdominal pain   Abdominal Pain Associated symptoms: constipation        Home Medications Prior to Admission medications   Medication Sig Start Date End Date Taking? Authorizing Provider  amLODipine (NORVASC) 10 MG tablet Take 1 tablet (10 mg total) by mouth daily. 03/30/23   Nahser, Deloris Ping, MD  aspirin 81 MG chewable tablet Chew 1 tablet by mouth daily in the afternoon. 09/27/19   [provider]  atorvastatin (LIPITOR) 40 MG tablet Take 1 tablet (40 mg total) by mouth daily. 03/06/23   Nahser, Deloris Ping, MD  cholecalciferol (VITAMIN D) 1000 units tablet Take 1,000 Units by mouth daily.    [provider]  docusate sodium (COLACE) 100 MG capsule Take 100 mg by mouth daily as needed for moderate constipation.    [provider]  ferrous sulfate 325 (65 FE) MG tablet Take 325 mg by mouth daily with breakfast.    [provider]  Magnesium 250 MG TABS Take 250 mg by mouth daily.    [provider]  meloxicam (MOBIC) 15 MG tablet Take 15 mg by mouth daily. 03/12/23   [provider]  Multiple Vitamin (MULTIVITAMIN) tablet Take 1 tablet by mouth daily.    [provider]  nitroGLYCERIN (NITROSTAT) 0.4 MG SL tablet Dissolve 1 tablet under the tongue every 5 minutes as needed for chest pain. Max of 3 doses, then 911. 01/16/23   Nahser, Deloris Ping, MD  Omega-3 Fatty Acids (FISH OIL) 1000 MG CAPS Take 1,000 mg by mouth daily.     [provider]  oxyCODONE-acetaminophen (PERCOCET/ROXICET) 5-325 MG tablet Take 1-2 tablets by mouth every 6 (six) hours as needed for severe  pain. 04/09/23   Marshia Ly, PA-C  tiZANidine (ZANAFLEX) 2 MG tablet Take 1 tablet (2 mg total) by mouth every 8 (eight) hours as needed for muscle spasms. 04/09/23   Marshia Ly, PA-C  vitamin B-12 (CYANOCOBALAMIN) 1000 MCG tablet Take 1,000 mcg by mouth daily.    [provider]      Allergies    Patient has no known allergies.    Review of Systems   Review of Systems  Gastrointestinal:  Positive for abdominal pain and constipation.    Physical Exam Updated Vital Signs BP 109/64 (BP Location: Right Arm)   Pulse 66   Temp 97.8 F (36.6 C) (Oral)   Resp 16   Ht 6' (1.829 m)   Wt 88.5 kg   SpO2 97%   BMI 26.45 kg/m  Physical Exam  ED Results / Procedures / Treatments   Labs (all labs ordered are listed, but only abnormal results are displayed) Labs Reviewed  COMPREHENSIVE METABOLIC PANEL - Abnormal; Notable for the following components:      Result Value   Potassium 3.2 (*)    ALT 48 (*)    All other components within normal limits  CBC - Abnormal; Notable for the following components:   HCT 38.1 (*)    All other components within normal limits  LIPASE, BLOOD  URINALYSIS, ROUTINE W REFLEX MICROSCOPIC    EKG EKG Interpretation Date/Time:  Friday  April 27 2023 16:07:36 EDT Ventricular Rate:  66 PR Interval:  210 QRS Duration:  96 QT Interval:  392 QTC Calculation: 411 R Axis:   -20  Text Interpretation: Sinus rhythm Borderline left axis deviation No significant change since last tracing Confirmed by Elayne Snare ([id number]) on 04/27/2023 5:14:08 PM  Radiology No results found.  Procedures Procedures  {Document cardiac monitor, telemetry assessment procedure when appropriate:1}  Medications Ordered in ED Medications  iohexol (OMNIPAQUE) 300 MG/ML solution 100 mL (100 mLs Intravenous Contrast Given 04/27/23 1738)    ED Course/ Medical Decision Making/ A&P   {   Click here for ABCD2, HEART and other calculatorsREFRESH Note before signing  :1}                              Medical Decision Making Amount and/or Complexity of Data Reviewed Radiology: ordered.  Risk Prescription drug management.   ***  {Document critical care time when appropriate:1} {Document review of labs and clinical decision tools ie heart score, Chads2Vasc2 etc:1}  {Document your independent review of radiology images, and any outside records:1} {Document your discussion with family members, caretakers, and with consultants:1} {Document social determinants of health affecting pt's care:1} {Document your decision making why or why not admission, treatments were needed:1} Final Clinical Impression(s) / ED Diagnoses Final diagnoses:  None    Rx / DC Orders ED Discharge Orders     None

## 2023-04-27 NOTE — ED Notes (Signed)
Bladder scan was 0ml. 

## 2023-04-27 NOTE — ED Notes (Signed)
Attempted to collect UA. Pt denied urge to void

## 2023-04-27 NOTE — ED Triage Notes (Signed)
The patient stated he has not had a BM since Tuesday. He has tried an enema and laxatives that have not helped. He is also having difficultly urinating.

## 2023-04-27 NOTE — Discharge Instructions (Signed)
You were seen in the ER for evaluation of your constipation.  It was found that she had a fecal impaction.  In laboratory feeling better.  I would like for you to try at least 1-2 scoops of MiraLAX in the morning while you are on the pain medication as one of the main side effects is constipation at this.  Please make sure you are staying well-hydrated as well.  If you have any concerns, new or worsening symptoms, please return to your nearest emergency department for reevaluation.  Contact a health care provider if you: Have ongoing pain in your rectum. Need to use an enema or a suppository more than 2 times a week. Have rectal bleeding. Continue to have problems. The problems may include not being able to go to the bathroom and having long-term constipation. Have pain in your abdomen. Have thin, pencil-like stools. Get help right away if: You have black or tarry stools.

## 2023-10-09 ENCOUNTER — Telehealth: Payer: Self-pay | Admitting: *Deleted

## 2023-10-09 NOTE — Telephone Encounter (Signed)
   Pre-operative Risk Assessment    Patient Name: [name]  DOB: Dec 31, 1950 MRN: [id number]   Date of last office visit: 12/2022 Date of next office visit: NONE   Request for Surgical Clearance    Procedure:   LEFT REVERSE TOTAL SHOULDER ARTHROPLASTY  Date of Surgery:  Clearance TBD                                Surgeon:  Jones Broom MD Surgeon's Group or Practice Name:  [facility] Phone number:  [phone] Fax number:  [phone]   Type of Clearance Requested:   - Medical  - Pharmacy:  Hold Aspirin NOT INDICATED   Type of Anesthesia:   CHOICE   Additional requests/questions:    Wilhemina Cash   10/09/2023, 10:29 AM

## 2023-10-10 ENCOUNTER — Telehealth: Payer: Self-pay | Admitting: *Deleted

## 2023-10-10 NOTE — Telephone Encounter (Signed)
   Name: Edward [name]  DOB: July 09, 1951  MRN: [id number]  Primary Cardiologist: Kristeen Miss, MD  Last OV with Dr. Elease Hashimoto 01/16/23. Preoperative team, please contact this patient and set up a phone call appointment for further preoperative risk assessment. Please obtain consent and complete medication review. Thank you for your help.  This note will be removed from the preop pool.  Please route back to P CV DIV PREOP if needed.  I confirm that guidance regarding antiplatelet and oral anticoagulation therapy has been completed and, if necessary, noted below. -ASA can be held for 5-7 days as previously noted by Dr. Elease Hashimoto (01/16/23)  I also confirmed the patient resides in the state of [place]. As per [facility] [employer] telemedicine laws, the patient must reside in the state in which the provider is licensed. -[place].   Tereso Newcomer, PA-C 10/10/2023, 8:36 AM [facility]

## 2023-10-10 NOTE — Telephone Encounter (Signed)
 Pt has been scheduled a tele visit, 10/16/23 9:20.  Consent on file / medications reconciled.     Patient Consent for Virtual Visit        Edward [name] has provided verbal consent on 10/10/2023 for a virtual visit (video or telephone).   CONSENT FOR VIRTUAL VISIT FOR:  Edward [name]  By participating in this virtual visit I agree to the following:  I hereby voluntarily request, consent and authorize [facility] and its employed or contracted physicians, physician assistants, nurse practitioners or other licensed health care professionals (the Practitioner), to provide me with telemedicine health care services (the "Services") as deemed necessary by the treating Practitioner. I acknowledge and consent to receive the Services by the Practitioner via telemedicine. I understand that the telemedicine visit will involve communicating with the Practitioner through live audiovisual communication technology and the disclosure of certain medical information by electronic transmission. I acknowledge that I have been given the opportunity to request an in-person assessment or other available alternative prior to the telemedicine visit and am voluntarily participating in the telemedicine visit.  I understand that I have the right to withhold or withdraw my consent to the use of telemedicine in the course of my care at any time, without affecting my right to future care or treatment, and that the Practitioner or I may terminate the telemedicine visit at any time. I understand that I have the right to inspect all information obtained and/or recorded in the course of the telemedicine visit and may receive copies of available information for a reasonable fee.  I understand that some of the potential risks of receiving the Services via telemedicine include:  Delay or interruption in medical evaluation due to technological equipment failure or disruption; Information transmitted may not be sufficient  (e.g. poor resolution of images) to allow for appropriate medical decision making by the Practitioner; and/or  In rare instances, security protocols could fail, causing a breach of personal health information.  Furthermore, I acknowledge that it is my responsibility to provide information about my medical history, conditions and care that is complete and accurate to the best of my ability. I acknowledge that Practitioner's advice, recommendations, and/or decision may be based on factors not within their control, such as incomplete or inaccurate data provided by me or distortions of diagnostic images or specimens that may result from electronic transmissions. I understand that the practice of medicine is not an exact science and that Practitioner makes no warranties or guarantees regarding treatment outcomes. I acknowledge that a copy of this consent can be made available to me via my patient portal [facility] MyChart), or I can request a printed copy by calling the office of [facility].    I understand that my insurance will be billed for this visit.   I have read or had this consent read to me. I understand the contents of this consent, which adequately explains the benefits and risks of the Services being provided via telemedicine.  I have been provided ample opportunity to ask questions regarding this consent and the Services and have had my questions answered to my satisfaction. I give my informed consent for the services to be provided through the use of telemedicine in my medical care

## 2023-10-10 NOTE — Telephone Encounter (Signed)
 Pt has been scheduled a tele visit, 10/16/23 9:20.  Consent on file / medications reconciled.

## 2023-10-15 NOTE — Progress Notes (Unsigned)
 Virtual Visit via Telephone Note   Because of Edward [name] co-morbid illnesses, he is at least at moderate risk for complications without adequate follow up.  This format is felt to be most appropriate for this patient at this time.  Due to technical limitations with video connection (technology), today's appointment will be conducted as an audio only telehealth visit, and Edward [name] verbally agreed to proceed in this manner.   All issues noted in this document were discussed and addressed.  No physical exam could be performed with this format.  Evaluation Performed:  Preoperative cardiovascular risk assessment _____________   Date:  10/15/2023   Patient ID:  Edward [name], DOB 1951/01/12, MRN [id number] Patient Location:  Home Provider location:   Office  Primary Care Provider:  Burna Sis Primary Cardiologist:  Kristeen Miss, MD  Chief Complaint / Patient Profile   73 y.o. y/o male with a h/o coronary artery disease, hypertension, prostate cancer who is pending LEFT REVERSE TOTAL SHOULDER ARTHROPLASTY and presents today for telephonic preoperative cardiovascular risk assessment.  History of Present Illness    Edward [name] is a 73 y.o. male who presents via audio/video conferencing for a telehealth visit today.  Pt was last seen in cardiology clinic on 01/16/2023 by Dr. Elease Hashimoto.  At that time Edward [name] was doing well .  The patient is now pending procedure as outlined above. Since his last visit, he continues to be stable from a cardiac standpoint.  Today he denies chest pain, shortness of breath, lower extremity edema, fatigue, palpitations, melena, hematuria, hemoptysis, diaphoresis, weakness, presyncope, syncope, orthopnea, and PND.   Past Medical History    Past Medical History:  Diagnosis Date   Chicken pox    Coronary artery disease    a. 1999 s/p MI with cath/PCI;  b. 05/1999 Ex Cardiolite EF 68%, no ishcemia.   Dyslipidemia    Family  history of breast cancer    Family history of oral cancer    History of tobacco abuse    Hypertension    Hypokalemia    Injury of cervical spine (HCC)    a. 02/2012 C4/5   Measles    Mumps    Myocardial infarction [facility])    Nasal bones, closed fracture    a. 02/2012 in setting of presyncope/fall   Near syncope    Pneumonia    Prostate cancer (HCC)    Whooping cough    Past Surgical History:  Procedure Laterality Date   ACHILLES TENDON SURGERY  1989   ANTERIOR CERVICAL DECOMP/DISCECTOMY FUSION  03/13/2012   Procedure: ANTERIOR CERVICAL DECOMPRESSION/DISCECTOMY FUSION 1 LEVEL;  Surgeon: Maeola Harman, MD;  Location: [facility];  Service: Neurosurgery;  Laterality: N/A;  Anterior Cervical Decompression/Fusion. Cervical four-five.   CARDIAC CATHETERIZATION  06/28/1998   single vessel CAD involving the distal RCA/PTCA and stenting of the distal RCA//EF- 50-55%   CLOSED REDUCTION NASAL FRACTURE  03/13/2012   Procedure: CLOSED REDUCTION NASAL FRACTURE;  Surgeon: Wayland Denis, DO;  Location: [facility];  Service: Plastics;  Laterality: N/A;  Internal and external splinting of nasal fracture   LYMPHADENECTOMY Bilateral 02/14/2018   Procedure: [facility];  Surgeon: Heloise Purpura, MD;  Location: [facility];  Service: Urology;  Laterality: Bilateral;   NM MYOVIEW LTD  05/17/2009   normal stress nuclear study/no evidence of ischemia/EF- 68%   petalla tendon surgery  1989   ROBOT ASSISTED LAPAROSCOPIC RADICAL PROSTATECTOMY N/A 02/14/2018   Procedure: XI ROBOTIC ASSISTED  LAPAROSCOPIC RADICAL PROSTATECTOMY;  Surgeon: Heloise Purpura, MD;  Location: [facility];  Service: Urology;  Laterality: N/A;   SHOULDER ARTHROSCOPY WITH OPEN ROTATOR CUFF REPAIR Left 04/09/2023   Procedure: SHOULDER ARTHROSCOPY WITH SUBACROMIAL DECOMPRESSION, DISTAL CLAVICAL RESECTION, BICEPS TENOTOMY, EXTENSIVE DEBRIDMENT OF ROTATOR CUFF TEAR;  Surgeon: Jodi Geralds, MD;  Location: [facility];  Service: Orthopedics;  Laterality:  Left;   TOTAL KNEE ARTHROPLASTY Right 09/26/2019   Procedure: TOTAL KNEE ARTHROPLASTY;  Surgeon: Jodi Geralds, MD;  Location: [facility];  Service: Orthopedics;  Laterality: Right;    Allergies  No Known Allergies  Home Medications    Prior to Admission medications   Medication Sig Start Date End Date Taking? Authorizing Provider  amLODipine (NORVASC) 10 MG tablet Take 1 tablet (10 mg total) by mouth daily. 03/30/23   Nahser, Deloris Ping, MD  aspirin 81 MG chewable tablet Chew 1 tablet by mouth daily in the afternoon. 09/27/19   [provider]  atorvastatin (LIPITOR) 80 MG tablet Take 80 mg by mouth daily.    [provider]  cholecalciferol (VITAMIN D) 1000 units tablet Take 1,000 Units by mouth daily.    [provider]  docusate sodium (COLACE) 100 MG capsule Take 100 mg by mouth daily as needed for moderate constipation.    [provider]  ferrous sulfate 325 (65 FE) MG tablet Take 325 mg by mouth daily with breakfast.    [provider]  Magnesium 250 MG TABS Take 250 mg by mouth daily.    [provider]  meloxicam (MOBIC) 15 MG tablet Take 15 mg by mouth daily. 03/12/23   [provider]  Multiple Vitamin (MULTIVITAMIN) tablet Take 1 tablet by mouth daily.    [provider]  nitroGLYCERIN (NITROSTAT) 0.4 MG SL tablet Dissolve 1 tablet under the tongue every 5 minutes as needed for chest pain. Max of 3 doses, then 911. 01/16/23   Nahser, Deloris Ping, MD  Omega-3 Fatty Acids (FISH OIL) 1000 MG CAPS Take 1,000 mg by mouth daily.     [provider]  oxyCODONE-acetaminophen (PERCOCET/ROXICET) 5-325 MG tablet Take 1-2 tablets by mouth every 6 (six) hours as needed for severe pain. 04/09/23   Marshia Ly, PA-C  vitamin B-12 (CYANOCOBALAMIN) 1000 MCG tablet Take 1,000 mcg by mouth daily.    [provider]    Physical Exam    Vital Signs:  Edward [name] does not have vital signs available for review  today.  Given telephonic nature of communication, physical exam is limited. AAOx3. NAD. Normal affect.  Speech and respirations are unlabored.  Accessory Clinical Findings    None  Assessment & Plan    1.  Preoperative Cardiovascular Risk Assessment:Procedure:   LEFT REVERSE TOTAL SHOULDER ARTHROPLASTY   Date of Surgery:  Clearance TBD                                  Surgeon:  Jones Broom MD Surgeon's Group or Practice Name:  [facility] Phone number:  [phone] Fax number:  [phone]      Primary Cardiologist: Kristeen Miss, MD  Chart reviewed as part of pre-operative protocol coverage. Given past medical history and time since last visit, based on ACC/AHA guidelines, Edward [name] would be at acceptable risk for the planned procedure without further cardiovascular testing.   His RCRI is low risk, 0.9% risk of major cardiac event.  He is able to  complete greater than 4 METS of physical activity.  Patient was advised that if he develops new symptoms prior to surgery to contact our office to arrange a follow-up appointment.  He verbalized understanding.  His aspirin can be held for 5-7 days prior to his procedure.  Please resume as soon as hemostasis is achieved.  I will route this recommendation to the requesting party via Epic fax function and remove from pre-op pool.       Time:   Today, I have spent 5 minutes with the patient with telehealth technology discussing medical history, symptoms, and management plan.  I spent 10 minutes reviewing his past medical history, cardiac medications, and cardiac testing.   Ronney Asters, NP  10/15/2023, 3:28 PM

## 2023-10-16 ENCOUNTER — Ambulatory Visit: Attending: Internal Medicine

## 2023-10-16 DIAGNOSIS — Z01810 Encounter for preprocedural cardiovascular examination: Secondary | ICD-10-CM

## 2023-10-17 NOTE — Telephone Encounter (Signed)
Office is calling for update. Please advise 

## 2023-10-25 ENCOUNTER — Other Ambulatory Visit: Payer: Self-pay | Admitting: Orthopedic Surgery

## 2023-11-01 NOTE — Progress Notes (Signed)
  PCP -   Caffie Damme ,MD [facility]  [place] [facility] Cardiologist - Laqueta Carina, MD Clearance 10-16-23 Scheryl Marten epic  PPM/ICD -  Device Orders -  Rep Notified -   Chest x-ray - 04-01-23 epic EKG - 04-27-23 epic Stress Test -  ECHO - 07-18-19 epic Cardiac Cath -   Sleep Study -  CPAP -   Fasting Blood Sugar -  Checks Blood Sugar _____ times a day  Blood Thinner Instructions: Aspirin Instructions:ASA 81 mg hold 5 -7 days  last dose 11-01-23  ERAS Protcol - PRE-SURGERY Ensure     COVID vaccine -yes  Activity--Able to climb a flight of stairs without CP or SOB Anesthesia review: HTN, CAD, MI , Sicca syndrome  Patient denies shortness of breath, fever, cough and chest pain at PAT appointment   All instructions explained to the patient, with a verbal understanding of the material. Patient agrees to go over the instructions while at home for a better understanding. Patient also instructed to self quarantine after being tested for COVID-19. The opportunity to ask questions was provided.

## 2023-11-01 NOTE — Patient Instructions (Signed)
 SURGICAL WAITING ROOM VISITATION  Patients having surgery or a procedure may have no more than 2 support people in the waiting area - these visitors may rotate.    Children under the age of 3 must have an adult with them who is not the patient.  Due to an increase in RSV and influenza rates and associated hospitalizations, children ages 3 and under may not visit patients in [facility] hospitals.  Visitors with respiratory illnesses are discouraged from visiting and should remain at home.  If the patient needs to stay at the hospital during part of their recovery, the visitor guidelines for inpatient rooms apply. Pre-op nurse will coordinate an appropriate time for 1 support person to accompany patient in pre-op.  This support person may not rotate.    Please refer to the [facility] website for the visitor guidelines for Inpatients (after your surgery is over and you are in a regular room).       Your procedure is scheduled on: 11-08-23   [facility]    Report to admitting at      0730  AM   Call this number if you have problems the morning of surgery [phone]   Do not eat food :After Midnight.   After Midnight you may have the following liquids until _0700_____ AM/ DAY OF SURGERY  then nothing by mouth  Water Non-Citrus Juices (without pulp, NO RED-Apple, White grape, White cranberry) Black Coffee (NO MILK/CREAM OR CREAMERS, sugar ok)  Clear Tea (NO MILK/CREAM OR CREAMERS, sugar ok) regular and decaf                             Plain Jell-O (NO RED)                                           Fruit ices (not with fruit pulp, NO RED)                                     Popsicles (NO RED)                                                               Sports drinks like Gatorade (NO RED)              Drink 2 Ensure/G2 drinks AT 10:00 PM the night before surgery.        The day of surgery:  Drink ONE (1) Pre-Surgery G2 0700  AM the morning of  surgery. Drink in one sitting. Do not sip.  This drink was given to you during your hospital  pre-op appointment visit. Nothing else to drink after completing the  Pre-Surgery Clear Ensure.          If you have questions, please contact your surgeon's office.   FOLLOW ANY ADDITIONAL PRE OP INSTRUCTIONS YOU RECEIVED FROM YOUR SURGEON'S OFFICE!!!     Oral Hygiene is also important to reduce your risk of infection.  Remember - BRUSH YOUR TEETH THE MORNING OF SURGERY WITH YOUR REGULAR TOOTHPASTE  DENTURES WILL BE REMOVED PRIOR TO SURGERY PLEASE DO NOT APPLY "Poly grip" OR ADHESIVES!!!   Do NOT smoke after Midnight   Stop all vitamins and herbal supplements 7 days before surgery.   Take these medicines the morning of surgery with A SIP OF WATER: Atorvastatin, amlodipine    Bring CPAP mask and tubing day of surgery.                              You may not have any metal on your body including hair pins, jewelry, and body piercing             Do not wear , lotions, powders, /cologne, or deodorant                Men may shave face and neck.   Do not bring valuables to the hospital. [place] IS NOT             RESPONSIBLE   FOR VALUABLES.   Contacts, glasses, dentures or bridgework may not be worn into surgery.   Bring small overnight bag day of surgery.   DO NOT BRING YOUR HOME MEDICATIONS TO THE HOSPITAL. PHARMACY WILL DISPENSE MEDICATIONS LISTED ON YOUR MEDICATION LIST TO YOU DURING YOUR ADMISSION IN THE HOSPITAL!    Patients discharged on the day of surgery will not be allowed to drive home.  Someone NEEDS to stay with you for the first 24 hours after anesthesia.   Special Instructions: Bring a copy of your healthcare power of attorney and living will documents the day of surgery if you haven't scanned them before.              Please read over the following fact sheets you were given: IF YOU HAVE QUESTIONS ABOUT YOUR PRE-OP  INSTRUCTIONS PLEASE CALL [phone]   . If you test positive for Covid or have been in contact with anyone that has tested positive in the last 10 days please notify you surgeon.   [place]- Preparing for Total Shoulder Arthroplasty    Before surgery, you can play an important role. Because skin is not sterile, your skin needs to be as free of germs as possible. You can reduce the number of germs on your skin by using the following products. Benzoyl Peroxide Gel Reduces the number of germs present on the skin Applied twice a day to shoulder area starting two days before surgery    ==================================================================  Please follow these instructions carefully:  BENZOYL PEROXIDE 5% GEL  Please do not use if you have an allergy to benzoyl peroxide.   If your skin becomes reddened/irritated stop using the benzoyl peroxide.  Starting two days before surgery, apply as follows: Apply benzoyl peroxide in the morning and at night. Apply after taking a shower. If you are not taking a shower clean entire shoulder front, back, and side along with the armpit with a clean wet washcloth.  Place a quarter-sized dollop on your shoulder and rub in thoroughly, making sure to cover the front, back, and side of your shoulder, along with the armpit.   2 days before ____ AM   ____ PM              1 day before ____ AM   ____ PM  Do this twice a day for two days.  (Last application is the night before surgery, AFTER using the CHG soap as described below).  Do NOT apply benzoyl peroxide gel on the day of surgery.      Pre-operative 5 CHG Bath Instructions   You can play a key role in reducing the risk of infection after surgery. Your skin needs to be as free of germs as possible. You can reduce the number of germs on your skin by washing with CHG (chlorhexidine gluconate) soap before surgery. CHG is an antiseptic soap that kills germs and  continues to kill germs even after washing.   DO NOT use if you have an allergy to chlorhexidine/CHG or antibacterial soaps. If your skin becomes reddened or irritated, stop using the CHG and notify one of our RNs at [phone].   Please shower with the CHG soap starting 4 days before surgery using the following schedule:     Please keep in mind the following:  DO NOT shave, including legs and underarms, starting the day of your first shower.   You may shave your face at any point before/day of surgery.  Place clean sheets on your bed the day you start using CHG soap. Use a clean washcloth (not used since being washed) for each shower. DO NOT sleep with pets once you start using the CHG.   CHG Shower Instructions:  If you choose to wash your hair and private area, wash first with your normal shampoo/soap.  After you use shampoo/soap, rinse your hair and body thoroughly to remove shampoo/soap residue.  Turn the water OFF and apply about 3 tablespoons (45 ml) of CHG soap to a CLEAN washcloth.  Apply CHG soap ONLY FROM YOUR NECK DOWN TO YOUR TOES (washing for 3-5 minutes)  DO NOT use CHG soap on face, private areas, open wounds, or sores.  Pay special attention to the area where your surgery is being performed.  If you are having back surgery, having someone wash your back for you may be helpful. Wait 2 minutes after CHG soap is applied, then you may rinse off the CHG soap.  Pat dry with a clean towel  Put on clean clothes/pajamas   If you choose to wear lotion, please use ONLY the CHG-compatible lotions on the back of this paper.     Additional instructions for the day of surgery: DO NOT APPLY any lotions, deodorants, cologne, or perfumes.   Put on clean/comfortable clothes.  Brush your teeth.  Ask your nurse before applying any prescription medications to the skin.      CHG Compatible Lotions   Aveeno Moisturizing lotion  Cetaphil Moisturizing Cream  Cetaphil Moisturizing  Lotion  Clairol Herbal Essence Moisturizing Lotion, Dry Skin  Clairol Herbal Essence Moisturizing Lotion, Extra Dry Skin  Clairol Herbal Essence Moisturizing Lotion, Normal Skin  Curel Age Defying Therapeutic Moisturizing Lotion with Alpha Hydroxy  Curel Extreme Care Body Lotion  Curel Soothing Hands Moisturizing Hand Lotion  Curel Therapeutic Moisturizing Cream, Fragrance-Free  Curel Therapeutic Moisturizing Lotion, Fragrance-Free  Curel Therapeutic Moisturizing Lotion, Original Formula  Eucerin Daily Replenishing Lotion  Eucerin Dry Skin Therapy Plus Alpha Hydroxy Crme  Eucerin Dry Skin Therapy Plus Alpha Hydroxy Lotion  Eucerin Original Crme  Eucerin Original Lotion  Eucerin Plus Crme Eucerin Plus Lotion  Eucerin TriLipid Replenishing Lotion  Keri Anti-Bacterial Hand Lotion  Keri Deep Conditioning Original Lotion Dry Skin Formula Softly Scented  Keri Deep Conditioning Original Lotion, Fragrance Free Sensitive Skin Formula  Keri Lotion Fast [employer] Fragrance Free Sensitive Skin Formula  Keri Lotion Fast Absorbing Softly Scented Dry Skin Formula  Keri Original Lotion  Keri Skin Renewal Lotion Keri Silky Smooth Lotion  Keri Silky Smooth Sensitive Skin Lotion  Nivea Body Creamy Conditioning Oil  Nivea Body Extra Enriched Lotion  Nivea Body Original Lotion  Nivea Body Sheer Moisturizing Lotion Nivea Crme  Nivea Skin Firming Lotion  NutraDerm 30 Skin Lotion  NutraDerm Skin Lotion  NutraDerm Therapeutic Skin Cream  NutraDerm Therapeutic Skin Lotion  ProShield Protective Hand Cream  Provon moisturizing lotion   Incentive Spirometer  An incentive spirometer is a tool that can help keep your lungs clear and active. This tool measures how well you are filling your lungs with each breath. Taking long deep breaths may help reverse or decrease the chance of developing breathing (pulmonary) problems (especially infection) following: A long period of time when you are unable to move  or be active. BEFORE THE PROCEDURE  If the spirometer includes an indicator to show your best effort, your nurse or respiratory therapist will set it to a desired goal. If possible, sit up straight or lean slightly forward. Try not to slouch. Hold the incentive spirometer in an upright position. INSTRUCTIONS FOR USE  Sit on the edge of your bed if possible, or sit up as far as you can in bed or on a chair. Hold the incentive spirometer in an upright position. Breathe out normally. Place the mouthpiece in your mouth and seal your lips tightly around it. Breathe in slowly and as deeply as possible, raising the piston or the ball toward the top of the column. Hold your breath for 3-5 seconds or for as long as possible. Allow the piston or ball to fall to the bottom of the column. Remove the mouthpiece from your mouth and breathe out normally. Rest for a few seconds and repeat Steps 1 through 7 at least 10 times every 1-2 hours when you are awake. Take your time and take a few normal breaths between deep breaths. The spirometer may include an indicator to show your best effort. Use the indicator as a goal to work toward during each repetition. After each set of 10 deep breaths, practice coughing to be sure your lungs are clear. If you have an incision (the cut made at the time of surgery), support your incision when coughing by placing a pillow or rolled up towels firmly against it. Once you are able to get out of bed, walk around indoors and cough well. You may stop using the incentive spirometer when instructed by your caregiver.  RISKS AND COMPLICATIONS Take your time so you do not get dizzy or light-headed. If you are in pain, you may need to take or ask for pain medication before doing incentive spirometry. It is harder to take a deep breath if you are having pain. AFTER USE Rest and breathe slowly and easily. It can be helpful to keep track of a log of your progress. Your caregiver can  provide you with a simple table to help with this. If you are using the spirometer at home, follow these instructions: SEEK MEDICAL CARE IF:  You are having difficultly using the spirometer. You have trouble using the spirometer as often as instructed. Your pain medication is not giving enough relief while using the spirometer. You develop fever of 100.5 F (38.1 C) or higher. SEEK IMMEDIATE MEDICAL CARE IF:  You cough up bloody sputum that had not been present  before. You develop fever of 102 F (38.9 C) or greater. You develop worsening pain at or near the incision site. MAKE SURE YOU:  Understand these instructions. Will watch your condition. Will get help right away if you are not doing well or get worse. Document Released: 11/27/2006 Document Revised: 10/09/2011 Document Reviewed: 01/28/2007 [facility] Patient Information 2014 [place], [place].   ________________________________________________________________________

## 2023-11-02 ENCOUNTER — Encounter (HOSPITAL_COMMUNITY)
Admission: RE | Admit: 2023-11-02 | Discharge: 2023-11-02 | Disposition: A | Discharge status: Home / Self Care | Source: Ambulatory Visit | Attending: Orthopedic Surgery | Admitting: Orthopedic Surgery

## 2023-11-02 ENCOUNTER — Encounter (HOSPITAL_COMMUNITY): Payer: Self-pay

## 2023-11-02 ENCOUNTER — Other Ambulatory Visit: Payer: Self-pay

## 2023-11-02 VITALS — BP 138/88 | HR 60 | Temp 98.7°F | Resp 16 | Ht 72.0 in | Wt 203.0 lb

## 2023-11-02 DIAGNOSIS — Z01812 Encounter for preprocedural laboratory examination: Secondary | ICD-10-CM | POA: Insufficient documentation

## 2023-11-02 DIAGNOSIS — Z01818 Encounter for other preprocedural examination: Secondary | ICD-10-CM

## 2023-11-02 DIAGNOSIS — I10 Essential (primary) hypertension: Secondary | ICD-10-CM

## 2023-11-02 HISTORY — DX: Unspecified osteoarthritis, unspecified site: M19.90

## 2023-11-02 HISTORY — DX: Sciatica, unspecified side: M54.30

## 2023-11-02 LAB — CBC
HCT: 40.6 % (ref 39.0–52.0)
Hemoglobin: 13.4 g/dL (ref 13.0–17.0)
MCH: 31.9 pg (ref 26.0–34.0)
MCHC: 33 g/dL (ref 30.0–36.0)
MCV: 96.7 fL (ref 80.0–100.0)
Platelets: 248 10*3/uL (ref 150–400)
RBC: 4.2 MIL/uL — ABNORMAL LOW (ref 4.22–5.81)
RDW: 12.7 % (ref 11.5–15.5)
WBC: 7 10*3/uL (ref 4.0–10.5)
nRBC: 0 % (ref 0.0–0.2)

## 2023-11-02 LAB — BASIC METABOLIC PANEL WITH GFR
Anion gap: 9 (ref 5–15)
BUN: 17 mg/dL (ref 8–23)
CO2: 24 mmol/L (ref 22–32)
Calcium: 8.9 mg/dL (ref 8.9–10.3)
Chloride: 106 mmol/L (ref 98–111)
Creatinine, Ser: 1.53 mg/dL — ABNORMAL HIGH (ref 0.61–1.24)
GFR, Estimated: 48 mL/min — ABNORMAL LOW (ref >60)
Glucose, Bld: 98 mg/dL (ref 70–99)
Potassium: 4 mmol/L (ref 3.5–5.1)
Sodium: 139 mmol/L (ref 135–145)

## 2023-11-02 LAB — SURGICAL PCR SCREEN
MRSA, PCR: NEGATIVE
Staphylococcus aureus: NEGATIVE

## 2023-11-03 ENCOUNTER — Emergency Department (HOSPITAL_COMMUNITY)

## 2023-11-03 ENCOUNTER — Observation Stay (HOSPITAL_COMMUNITY)

## 2023-11-03 ENCOUNTER — Encounter (HOSPITAL_COMMUNITY): Payer: Self-pay

## 2023-11-03 ENCOUNTER — Observation Stay (HOSPITAL_COMMUNITY)
Admission: EM | Admit: 2023-11-03 | Discharge: 2023-11-04 | Disposition: A | Discharge status: Home / Self Care | Attending: Family Medicine | Admitting: Family Medicine

## 2023-11-03 ENCOUNTER — Other Ambulatory Visit: Payer: Self-pay

## 2023-11-03 DIAGNOSIS — Z8546 Personal history of malignant neoplasm of prostate: Secondary | ICD-10-CM | POA: Insufficient documentation

## 2023-11-03 DIAGNOSIS — Z87891 Personal history of nicotine dependence: Secondary | ICD-10-CM | POA: Insufficient documentation

## 2023-11-03 DIAGNOSIS — G459 Transient cerebral ischemic attack, unspecified: Secondary | ICD-10-CM | POA: Diagnosis not present

## 2023-11-03 DIAGNOSIS — Z79899 Other long term (current) drug therapy: Secondary | ICD-10-CM | POA: Diagnosis not present

## 2023-11-03 DIAGNOSIS — D509 Iron deficiency anemia, unspecified: Secondary | ICD-10-CM | POA: Diagnosis not present

## 2023-11-03 DIAGNOSIS — I639 Cerebral infarction, unspecified: Secondary | ICD-10-CM | POA: Diagnosis present

## 2023-11-03 DIAGNOSIS — I2510 Atherosclerotic heart disease of native coronary artery without angina pectoris: Secondary | ICD-10-CM | POA: Diagnosis not present

## 2023-11-03 DIAGNOSIS — R531 Weakness: Secondary | ICD-10-CM | POA: Diagnosis present

## 2023-11-03 DIAGNOSIS — Z789 Other specified health status: Secondary | ICD-10-CM

## 2023-11-03 DIAGNOSIS — I10 Essential (primary) hypertension: Secondary | ICD-10-CM | POA: Insufficient documentation

## 2023-11-03 DIAGNOSIS — I6621 Occlusion and stenosis of right posterior cerebral artery: Secondary | ICD-10-CM

## 2023-11-03 DIAGNOSIS — N179 Acute kidney failure, unspecified: Secondary | ICD-10-CM | POA: Insufficient documentation

## 2023-11-03 DIAGNOSIS — Z96651 Presence of right artificial knee joint: Secondary | ICD-10-CM | POA: Insufficient documentation

## 2023-11-03 DIAGNOSIS — Z7982 Long term (current) use of aspirin: Secondary | ICD-10-CM | POA: Insufficient documentation

## 2023-11-03 DIAGNOSIS — I6381 Other cerebral infarction due to occlusion or stenosis of small artery: Secondary | ICD-10-CM | POA: Diagnosis not present

## 2023-11-03 DIAGNOSIS — I6350 Cerebral infarction due to unspecified occlusion or stenosis of unspecified cerebral artery: Principal | ICD-10-CM

## 2023-11-03 DIAGNOSIS — E785 Hyperlipidemia, unspecified: Secondary | ICD-10-CM | POA: Diagnosis not present

## 2023-11-03 LAB — DIFFERENTIAL
Abs Immature Granulocytes: 0.01 10*3/uL (ref 0.00–0.07)
Basophils Absolute: 0 10*3/uL (ref 0.0–0.1)
Basophils Relative: 1 %
Eosinophils Absolute: 0.1 10*3/uL (ref 0.0–0.5)
Eosinophils Relative: 2 %
Immature Granulocytes: 0 %
Lymphocytes Relative: 29 %
Lymphs Abs: 1.5 10*3/uL (ref 0.7–4.0)
Monocytes Absolute: 0.5 10*3/uL (ref 0.1–1.0)
Monocytes Relative: 11 %
Neutro Abs: 3 10*3/uL (ref 1.7–7.7)
Neutrophils Relative %: 57 %

## 2023-11-03 LAB — HEMOGLOBIN A1C
Hgb A1c MFr Bld: 5.6 % (ref 4.8–5.6)
Mean Plasma Glucose: 114.02 mg/dL

## 2023-11-03 LAB — I-STAT CHEM 8, ED
BUN: 17 mg/dL (ref 8–23)
Calcium, Ion: 1.09 mmol/L — ABNORMAL LOW (ref 1.15–1.40)
Chloride: 104 mmol/L (ref 98–111)
Creatinine, Ser: 1.5 mg/dL — ABNORMAL HIGH (ref 0.61–1.24)
Glucose, Bld: 97 mg/dL (ref 70–99)
HCT: 36 % — ABNORMAL LOW (ref 39.0–52.0)
Hemoglobin: 12.2 g/dL — ABNORMAL LOW (ref 13.0–17.0)
Potassium: 3.8 mmol/L (ref 3.5–5.1)
Sodium: 139 mmol/L (ref 135–145)
TCO2: 26 mmol/L (ref 22–32)

## 2023-11-03 LAB — COMPREHENSIVE METABOLIC PANEL WITH GFR
ALT: 21 U/L (ref 0–44)
AST: 21 U/L (ref 15–41)
Albumin: 3.6 g/dL (ref 3.5–5.0)
Alkaline Phosphatase: 58 U/L (ref 38–126)
Anion gap: 8 (ref 5–15)
BUN: 16 mg/dL (ref 8–23)
CO2: 26 mmol/L (ref 22–32)
Calcium: 8.9 mg/dL (ref 8.9–10.3)
Chloride: 103 mmol/L (ref 98–111)
Creatinine, Ser: 1.4 mg/dL — ABNORMAL HIGH (ref 0.61–1.24)
GFR, Estimated: 53 mL/min — ABNORMAL LOW (ref >60)
Glucose, Bld: 101 mg/dL — ABNORMAL HIGH (ref 70–99)
Potassium: 3.7 mmol/L (ref 3.5–5.1)
Sodium: 137 mmol/L (ref 135–145)
Total Bilirubin: 0.8 mg/dL (ref 0.0–1.2)
Total Protein: 6.2 g/dL — ABNORMAL LOW (ref 6.5–8.1)

## 2023-11-03 LAB — CBC
HCT: 36.7 % — ABNORMAL LOW (ref 39.0–52.0)
Hemoglobin: 12.7 g/dL — ABNORMAL LOW (ref 13.0–17.0)
MCH: 32.3 pg (ref 26.0–34.0)
MCHC: 34.6 g/dL (ref 30.0–36.0)
MCV: 93.4 fL (ref 80.0–100.0)
Platelets: 229 10*3/uL (ref 150–400)
RBC: 3.93 MIL/uL — ABNORMAL LOW (ref 4.22–5.81)
RDW: 12.6 % (ref 11.5–15.5)
WBC: 5.2 10*3/uL (ref 4.0–10.5)
nRBC: 0 % (ref 0.0–0.2)

## 2023-11-03 LAB — LIPID PANEL
Cholesterol: 124 mg/dL (ref 0–200)
HDL: 37 mg/dL — ABNORMAL LOW (ref >40)
LDL Cholesterol: 68 mg/dL (ref 0–99)
Total CHOL/HDL Ratio: 3.4 ratio
Triglycerides: 93 mg/dL (ref <150)
VLDL: 19 mg/dL (ref 0–40)

## 2023-11-03 LAB — TSH: TSH: 0.619 u[IU]/mL (ref 0.350–4.500)

## 2023-11-03 MED ORDER — FERROUS SULFATE 325 (65 FE) MG PO TABS
325.0000 mg | ORAL_TABLET | Freq: Every day | ORAL | Status: DC
  Administered 2023-11-04: 325 mg via ORAL
  Filled 2023-11-03: qty 1

## 2023-11-03 MED ORDER — FISH OIL 1200 MG PO CAPS: 1200.0000 mg | ORAL_CAPSULE | Freq: Every day | ORAL | Status: DC

## 2023-11-03 MED ORDER — POTASSIUM 99 MG PO TABS
99.0000 mg | ORAL_TABLET | Freq: Every day | ORAL | Status: DC
Start: 2023-11-03 — End: 2023-11-03

## 2023-11-03 MED ORDER — ENOXAPARIN SODIUM 40 MG/0.4ML IJ SOSY
40.0000 mg | PREFILLED_SYRINGE | INTRAMUSCULAR | Status: DC
  Administered 2023-11-03: 40 mg via SUBCUTANEOUS
  Filled 2023-11-03: qty 0.4

## 2023-11-03 MED ORDER — ASPIRIN 81 MG PO CHEW
81.0000 mg | CHEWABLE_TABLET | Freq: Every day | ORAL | Status: DC
  Administered 2023-11-03 – 2023-11-04 (×2): 81 mg via ORAL
  Filled 2023-11-03 (×2): qty 1

## 2023-11-03 MED ORDER — NITROGLYCERIN 0.4 MG SL SUBL: 0.4000 mg | SUBLINGUAL_TABLET | SUBLINGUAL | Status: DC | PRN

## 2023-11-03 MED ORDER — VITAMIN D 25 MCG (1000 UNIT) PO TABS
2000.0000 [IU] | ORAL_TABLET | Freq: Every day | ORAL | Status: DC
  Administered 2023-11-03 – 2023-11-04 (×2): 2000 [IU] via ORAL
  Filled 2023-11-03 (×2): qty 2

## 2023-11-03 MED ORDER — VITAMIN B-12 1000 MCG PO TABS
1000.0000 ug | ORAL_TABLET | Freq: Every day | ORAL | Status: DC
  Administered 2023-11-03 – 2023-11-04 (×2): 1000 ug via ORAL
  Filled 2023-11-03 (×2): qty 1

## 2023-11-03 MED ORDER — ATORVASTATIN CALCIUM 80 MG PO TABS
80.0000 mg | ORAL_TABLET | Freq: Every day | ORAL | Status: DC
  Administered 2023-11-04: 80 mg via ORAL
  Filled 2023-11-03 (×2): qty 1

## 2023-11-03 MED ORDER — ADULT MULTIVITAMIN W/MINERALS CH
1.0000 | ORAL_TABLET | Freq: Every day | ORAL | Status: DC
  Administered 2023-11-03 – 2023-11-04 (×2): 1 via ORAL
  Filled 2023-11-03 (×2): qty 1

## 2023-11-03 MED ORDER — CLOPIDOGREL BISULFATE 75 MG PO TABS
75.0000 mg | ORAL_TABLET | Freq: Every day | ORAL | Status: DC
  Administered 2023-11-03 – 2023-11-04 (×2): 75 mg via ORAL
  Filled 2023-11-03 (×2): qty 1

## 2023-11-03 MED ORDER — DOCUSATE SODIUM 100 MG PO CAPS
100.0000 mg | ORAL_CAPSULE | Freq: Every day | ORAL | Status: DC
  Filled 2023-11-03 (×2): qty 1

## 2023-11-03 MED ORDER — ENOXAPARIN SODIUM 40 MG/0.4ML IJ SOSY: 40.0000 mg | PREFILLED_SYRINGE | INTRAMUSCULAR | Status: DC

## 2023-11-03 MED ORDER — MAGNESIUM OXIDE -MG SUPPLEMENT 400 (240 MG) MG PO TABS
400.0000 mg | ORAL_TABLET | Freq: Every day | ORAL | Status: DC
  Administered 2023-11-03 – 2023-11-04 (×2): 400 mg via ORAL
  Filled 2023-11-03 (×2): qty 1

## 2023-11-03 MED ORDER — LACTATED RINGERS IV SOLN: INTRAVENOUS | Status: DC

## 2023-11-03 MED ORDER — IOHEXOL 350 MG/ML SOLN
75.0000 mL | Freq: Once | INTRAVENOUS | Status: AC | PRN
  Administered 2023-11-03: 75 mL via INTRAVENOUS

## 2023-11-03 NOTE — Assessment & Plan Note (Addendum)
 Versus CVA.  CT angiogram negative for acute process though with high-grade stenosis of P2.  No evidence of carotid stenosis though with atherosclerosis. Neuro exam intact on exam, no residual deficits, passed bedside swallow screen. Awaiting MRI brain. We will admit for full TIA workup. -Admitted to [facility] Medicine Teaching Service, med/tele bed, Dr.McIntyre attending -Appreciate/agree with neuro service recs, including:  -MRI brain WO contrast  -A1c, fasting lipid panel  -Echocardiogram  -Begin DAPT home aspirin 81 mg daily and Plavix 75 mg daily  -PT/OT/speech eval and treat, appreciate recs  -Stroke team to follow -A.m. BMP, CBC -Vital signs - Neurochecks every 4

## 2023-11-03 NOTE — Plan of Care (Signed)
 [facility]MT[facility] Brief Progress Note  Edward [name] is a 73 y.o. male admitted for stroke workup.  Patient reports complete resolution of symptoms.  He has not had any more numbness, tingling, weakness, changes in vision since being in the emergency department.  He is back to normal strength.   O: BP (!) 145/94   Pulse 67   Temp 97.9 F (36.6 C)   Resp 18   SpO2 100%   Well-appearing, no acute distress Cardio: Regular rate, regular rhythm, no murmurs on exam Pulm: Lungs clear, no wheezing, no increased work of breathing Neuro: CN II: PERRL CN III, IV,VI: EOMI CV V: Normal sensation in V1, V2, V3 CVII: Symmetric smile and brow raise CN VIII: Normal hearing CN IX,X: Symmetric palate raise  CN XI: 5/5 shoulder shrug CN XII: Symmetric tongue protrusion  UE and LE strength 5/5 Normal sensation in UE and LE bilaterally    MRI brain: 10 mm acute infarct of the posterior right occipital lobe  A/P: Stroke: Reviewed MRI results with the patient.  Normal exam with no acute neurological deficits.  A1c 5.6, LDL 68. - Permissive hypertension for at least 24 hours: Can consider adding back home 10 mg of amlodipine tomorrow. - Echo pending - Continue aspirin and Plavix 75 mg  - Continue Lipitor 80 mg daily - PT/OT/SLP pending - Heart healthy diet, passed bedside swallow - Orders reviewed. Labs for AM ordered, which was adjusted as needed.  - If condition changes, plan includes repeat brain imaging.    Glendale Chard, DO 11/03/2023, 9:31 PM PGY-2, [facility] Night Resident  Please page [phone] with questions.

## 2023-11-03 NOTE — ED Provider Notes (Signed)
 [place] [facility]GENCY DEPARTMENT AT [facility] Provider Note   CSN: [id number] Arrival date & time: 11/03/23  1118     History  Chief Complaint  Patient presents with   Transient Ischemic Attack    WAYDE GOPAUL is a 73 y.o. male.  HPI   The patient has a history of hypertension pneumonia coronary artery disease sciatica rotator cuff syndrome.  Patient presented to the ED for evaluation of possible TIA.  Patient states short time ago patient had sudden onset of feeling acute weakness on the left side primarily involving his left arm and left leg.  Patient states he could not stand.  He could not raise his arm.  Felt very weak.  He did not have any trouble with his speech or his vision.  He also did have a headache.  Patient states the symptoms lasted approximately 7 to 10 minutes.  He called EMS.  By the time they arrived the symptoms had resolved.  Currently denies any complaints at this time  Home Medications Prior to Admission medications   Medication Sig Start Date End Date Taking? Authorizing Provider  amLODipine (NORVASC) 10 MG tablet Take 1 tablet (10 mg total) by mouth daily. 03/30/23  Yes Nahser, Deloris Ping, MD  aspirin 81 MG chewable tablet Chew 81 mg by mouth daily in the afternoon. 09/27/19  Yes [provider]  atorvastatin (LIPITOR) 80 MG tablet Take 80 mg by mouth daily.   Yes [provider]  Cholecalciferol (VITAMIN D) 50 MCG (2000 UT) CAPS Take 2,000 Units by mouth daily.   Yes [provider]  docusate sodium (COLACE) 100 MG capsule Take 100 mg by mouth daily.   Yes [provider]  ferrous sulfate 325 (65 FE) MG tablet Take 325 mg by mouth daily with breakfast.   Yes [provider]  Magnesium 250 MG TABS Take 250 mg by mouth daily.   Yes [provider]  meloxicam (MOBIC) 15 MG tablet Take 15 mg by mouth daily. 03/12/23  Yes [provider]  Multiple Vitamin (MULTIVITAMIN) tablet Take 1 tablet  by mouth daily. 50+   Yes [provider]  nitroGLYCERIN (NITROSTAT) 0.4 MG SL tablet Dissolve 1 tablet under the tongue every 5 minutes as needed for chest pain. Max of 3 doses, then 911. 01/16/23  Yes Nahser, Deloris Ping, MD  Omega-3 Fatty Acids (FISH OIL) 1200 MG CAPS Take 1,200 mg by mouth daily.   Yes [provider]  Potassium 99 MG TABS Take 99 mg by mouth daily.   Yes [provider]  vitamin B-12 (CYANOCOBALAMIN) 1000 MCG tablet Take 1,000 mcg by mouth daily.   Yes [provider]      Allergies    Patient has no known allergies.    Review of Systems   Review of Systems  Physical Exam Updated Vital Signs BP 138/88   Pulse 67   Temp 98.1 F (36.7 C) (Oral)   Resp 18   SpO2 100%  Physical Exam Vitals and nursing note reviewed.  Constitutional:      General: He is not in acute distress.    Appearance: He is well-developed.  HENT:     Head: Normocephalic and atraumatic.     Right Ear: External ear normal.     Left Ear: External ear normal.  Eyes:     General: No visual field deficit or scleral icterus.       Right eye: No discharge.  Left eye: No discharge.     Conjunctiva/sclera: Conjunctivae normal.  Neck:     Trachea: No tracheal deviation.  Cardiovascular:     Rate and Rhythm: Normal rate and regular rhythm.  Pulmonary:     Effort: Pulmonary effort is normal. No respiratory distress.     Breath sounds: Normal breath sounds. No stridor. No wheezing or rales.  Abdominal:     General: Bowel sounds are normal. There is no distension.     Palpations: Abdomen is soft.     Tenderness: There is no abdominal tenderness. There is no guarding or rebound.  Musculoskeletal:        General: No tenderness.     Cervical back: Neck supple.  Skin:    General: Skin is warm and dry.     Findings: No rash.  Neurological:     Mental Status: He is alert and oriented to person, place, and time.     Cranial Nerves: No cranial nerve deficit,  dysarthria or facial asymmetry.     Sensory: No sensory deficit.     Motor: No abnormal muscle tone, seizure activity or pronator drift.     Coordination: Coordination normal.     Comments:  able to hold both legs off bed for 5 seconds, sensation intact in all extremities,  no left or right sided neglect, normal finger-nose exam bilaterally, no nystagmus noted   Psychiatric:        Mood and Affect: Mood normal.     ED Results / Procedures / Treatments   Labs (all labs ordered are listed, but only abnormal results are displayed) Labs Reviewed  CBC - Abnormal; Notable for the following components:      Result Value   RBC 3.93 (*)    Hemoglobin 12.7 (*)    HCT 36.7 (*)    All other components within normal limits  COMPREHENSIVE METABOLIC PANEL WITH GFR - Abnormal; Notable for the following components:   Glucose, Bld 101 (*)    Creatinine, Ser 1.40 (*)    Total Protein 6.2 (*)    GFR, Estimated 53 (*)    All other components within normal limits  I-STAT CHEM 8, ED - Abnormal; Notable for the following components:   Creatinine, Ser 1.50 (*)    Calcium, Ion 1.09 (*)    Hemoglobin 12.2 (*)    HCT 36.0 (*)    All other components within normal limits  DIFFERENTIAL    EKG EKG Interpretation Date/Time:  Saturday November 03 2023 11:23:42 EDT Ventricular Rate:  67 PR Interval:  225 QRS Duration:  97 QT Interval:  379 QTC Calculation: 400 R Axis:   -12  Text Interpretation: Sinus rhythm Prolonged PR interval No significant change since last tracing Confirmed by Linwood Dibbles [phone]) on 11/03/2023 11:30:00 AM  Radiology CT ANGIO HEAD NECK W WO CM Result Date: 11/03/2023 CLINICAL DATA:  Stroke/TIA. Episode involving sudden onset of left-sided weakness, numbness and tingling. Symptoms have since resolved. EXAM: CT ANGIOGRAPHY HEAD AND NECK WITH AND WITHOUT CONTRAST TECHNIQUE: Multidetector CT imaging of the head and neck was performed using the standard protocol during bolus administration  of intravenous contrast. Multiplanar CT image reconstructions and MIPs were obtained to evaluate the vascular anatomy. Carotid stenosis measurements (when applicable) are obtained utilizing NASCET criteria, using the distal internal carotid diameter as the denominator. RADIATION DOSE REDUCTION: This exam was performed according to the departmental dose-optimization program which includes automated exposure control, adjustment of the mA and/or kV according to patient  size and/or use of iterative reconstruction technique. CONTRAST:  75mL OMNIPAQUE IOHEXOL 350 MG/ML SOLN COMPARISON:  CT head without contrast 03/10/2012 FINDINGS: CT HEAD FINDINGS Brain: No acute infarct, hemorrhage, or mass lesion is present. Mild generalized atrophy and moderate diffuse white matter disease demonstrates some progression since the prior exam. The ventricles are of normal size. No significant extraaxial fluid collection is present. The brainstem and cerebellum are within normal limits. Midline structures are within normal limits. Vascular: Atherosclerotic calcifications are present within the cavernous internal carotid arteries bilaterally. No hyperdense vessel is present. Skull: Calvarium is intact. No focal lytic or blastic lesions are present. No significant extracranial soft tissue lesion is present. Sinuses/Orbits: The paranasal sinuses and mastoid air cells are clear. The globes and orbits are within normal limits. Other: Review of the MIP images confirms the above findings CTA NECK FINDINGS Aortic arch: A 3 vessel arch configuration is present. Atherosclerotic calcifications are present in the distal arch and at the origin of the left common carotid artery. No significant stenosis is present. No aneurysm or dissection is present. Right carotid system: The right common carotid artery is within normal limits. Atherosclerotic calcifications are present at the right common carotid artery and proximal right ICA without significant  stenosis. The more distal cervical right ICA is normal. Left carotid system: The left common carotid artery is otherwise within normal limits. Atherosclerotic calcifications are present at the left carotid bifurcation. No significant stenosis is present. The cervical left ICA is otherwise normal. Vertebral arteries: The right vertebral artery is dominant. Both vertebral arteries originate from the subclavian arteries without significant stenosis. No significant stenosis is present in either vertebral artery in the neck. Skeleton: Solid fusion is present at C3-4 and C4-5. Facet hypertrophy contributes to foraminal stenosis bilaterally at C3-4. Uncovertebral spurring is present at C5-6 and C6-7. Other neck: The soft tissues of the neck are otherwise unremarkable. Salivary glands are within normal limits. Thyroid is normal. No significant adenopathy is present. No focal mucosal or submucosal lesions are present. Upper chest: The lung apices are clear. The thoracic inlet is within normal limits. Review of the MIP images confirms the above findings CTA HEAD FINDINGS Anterior circulation: Atherosclerotic calcifications are present within the cavernous internal carotid arteries without significant stenosis through the ICA termini. The A1 and M1 segments are normal. The anterior communicating artery is patent. The ACA and MCA branch vessels are within normal limits. No aneurysm is present. Posterior circulation: The right vertebral artery is the dominant vessel. The left vertebral artery is centrally terminates at the PICA. Basilar artery is within normal limits. Superior cerebellar arteries are patent bilaterally. Both posterior cerebral arteries originate from the basilar tip. A high-grade stenosis is present in the right P2 segment. Distal PCA branch vessels are attenuated. Left PCA branch vessels are normal. Venous sinuses: The dural sinuses are patent. The straight sinus and deep cerebral veins are intact. Cortical  veins are within normal limits. No significant vascular malformation is evident. Anatomic variants: None Review of the MIP images confirms the above findings IMPRESSION: 1. High-grade stenosis of the right P2 segment with attenuation of distal PCA branch vessels. 2. No other significant proximal stenosis, aneurysm, or branch vessel occlusion within the Circle of Willis. 3. Atherosclerotic changes at the carotid bifurcations bilaterally and cavernous internal carotid arteries bilaterally without significant stenosis. 4. Solid fusion at C3-4 and C4-5. 5. Facet hypertrophy contributes to foraminal stenosis bilaterally at C3-4. Electronically Signed   By: Virl Son.D.  On: 11/03/2023 13:26    Procedures Procedures    Medications Ordered in ED Medications  iohexol (OMNIPAQUE) 350 MG/ML injection 75 mL (75 mLs Intravenous Contrast Given 11/03/23 1253)    ED Course/ Medical Decision Making/ A&P Clinical Course as of 11/03/23 1428  Sat Nov 03, 2023  1339 CT angio head and neck shows high-grade stenosis of the right P2 segment with attenuation of distal PCA branch vessels.  Atherosclerotic changes at the carotid bifurcations bilaterally.  CT does not show any signs of acute abnormality [JK]  1417 Case discussed with Family medicine service.  Will see pt in consultation [JK]    Clinical Course User Index [JK] Linwood Dibbles, MD       ABCD2 Score: 4                        moderate risk score Medical Decision Making Differential diagnosis includes but not limited to stroke, TIA, cerebral hemorrhage  Problems Addressed: TIA (transient ischemic attack): acute illness or injury that poses a threat to life or bodily functions  Amount and/or Complexity of Data Reviewed Labs: ordered. Decision-making details documented in ED Course. Radiology: ordered and independent interpretation performed.  Risk Prescription drug management. Decision regarding hospitalization.   Patient presented to  the ED for evaluation of acute onset of weakness in the left arm and left leg.  Presentation concerning for possible TIA occult stroke.  Patient symptoms all resolved before he arrived in the ED.  On my exam he does not have any focal weakness.  Patient does have some limitation in his movement but this is on the other side and related to his orthopedic conditions.  Patient remained stable in the ED.  No recurrent symptoms.   No signs of acute stroke on CT.  No evidence of carotid stenosis on CT angio.  I have consulted with neurology service Dr. Donovan Kail .  Will see patient in consultation and recommends admission to hospital for further TIA workup        Final Clinical Impression(s) / ED Diagnoses Final diagnoses:  TIA (transient ischemic attack)    Rx / DC Orders ED Discharge Orders     None         Linwood Dibbles, MD 11/03/23 1428

## 2023-11-03 NOTE — Assessment & Plan Note (Signed)
 Creatinine 1.53 on presentation, up from baseline around 1.  Likely prerenal. -Start IV fluids -Encourage p.o. -AM BMP

## 2023-11-03 NOTE — Assessment & Plan Note (Addendum)
 IDA: Continue ferrous sulfate 325 daily CAD: Aspirin as above, continue home PRN nitroglycerin 0.4 mg per chest pain HLD: Continue atorvastatin 80 mg daily Home supplements: Continue vitamin D, B12, multivitamin, magnesium

## 2023-11-03 NOTE — H&P (Addendum)
 Hospital Admission History and Physical Service Pager: [phone]  Patient name: Edward [name] Medical record number: [id number] Date of Birth: 1951/01/20 Age: 73 y.o. Gender: male  Primary Care Provider: Caffie Damme, MD Consultants: Neurology Code Status: Full code Preferred Emergency Contact: Edward [name], spouse [phone])  Chief Complaint: Transient sudden-onset left upper and lower limb weakness  Assessment and Plan: Edward [name] is a 73 y.o. male presenting with suspected TIA. PMH includes HTN, prostate cancer in remission, CAD c/b MI (cath 1999), HLD, C-spine injury (2013, C4/C5), left rotator cuff arthritis, sciatica. Differential for this patient's presentation of this includes:  TIA, most likely: Transient left-sided upper and lower extremity weakness, resolved after 30 minutes without visualization on CT head imaging is pathognomonic for TIA. CVA, possible: No ongoing symptomatology or imaging findings corresponding with CVA.  MRI ordered, will confirm diagnosis. Focal seizure, less likely: No convulsive activity noted.  Patient maintained full consciousness throughout episode.  No postictal state.  No previous history of seizure.  No previous history of CVA, or other seizure nidus. Hemiplegic migraine, less likely: Transient, no aura, no history of migraine.  Bitemporal presentation would be unusual for migraine. Assessment & Plan TIA (transient ischemic attack) Versus CVA.  CT angiogram negative for acute process though with high-grade stenosis of P2.  No evidence of carotid stenosis though with atherosclerosis. Neuro exam intact on exam, no residual deficits, passed bedside swallow screen. Awaiting MRI brain. We will admit for full TIA workup. -Admitted to [facility] Teaching Service, med/tele bed, Dr.McIntyre attending -Appreciate/agree with neuro service recs, including:  -MRI brain WO contrast  -A1c, fasting lipid panel  -Echocardiogram  -Begin DAPT  home aspirin 81 mg daily and Plavix 75 mg daily  -PT/OT/speech eval and treat, appreciate recs  -Stroke team to follow -A.m. BMP, CBC -Vital signs - Neurochecks every 4 AKI (acute kidney injury) (HCC) Creatinine 1.53 on presentation, up from baseline around 1.  Likely prerenal. -Start IV fluids -Encourage p.o. -AM BMP Hypertension BP acceptable.  Holding amlodipine while awaiting MRI to confirm no stroke. -Add back home amlodipine as indicated Chronic health problem IDA: Continue ferrous sulfate 325 daily CAD: Aspirin as above, continue home PRN nitroglycerin 0.4 mg per chest pain HLD: Continue atorvastatin 80 mg daily Home supplements: Continue vitamin D, B12, multivitamin, magnesium  FEN/GI: Heart VTE Prophylaxis: SCDs until confirm negative MRI to reduce potential risk of hemorrhagic conversion  Disposition: Home with wife  History of Present Illness:  Edward [name] is a 73 y.o. male with HTN, prostate cancer, CAD c/b MI (cath 1999), HLD, C-spine injury (2013, C4/C5), left rotator cuff arthritis, sciatica and pneumonia presenting with a 30-minute episode of transient left hand and left lower limb weakness.  On interview, patient describes sitting at his desk earlier this morning when he noticed that his left hand felt like "mush", and feeling weakness in his left leg.  Did not feel he could get out of his chair, and asked daughter to alert EMS.  By the time they arrived ~ 30 minutes later, symptoms had entirely resolved.    Patient denies aphasia, confusion, vision changes.  No loss of consciousness.  Notes two-day history of bitemporal headache, which is unusual.  Daughter (who works as a Engineer, civil (consulting)) witnessed patient at time of event, did not note right facial droop and his smile was symmetric.  Patient has left rotator cuff stiffness s/p left shoulder arthroscopy 04/2023.  Limited movement.  Also notes limited movement of right leg  secondary to sciatica diagnosis and received right  TKA 2021.  Patient denies history of previous CVA, TIA, seizure.  Patient takes aspirin daily, although notes he misses his meds occasionally.  In the ED, patient had a unremarkable neurological exam, limited by previously known .  CTA head showed high-grade stenosis of right P2 segment, otherwise no acute findings.  Review Of Systems: Per HPI.  Pertinent Past Medical History: CAD status post MI in 1999 HLD HTN Cervical spine injury status post correction 02/2012 Prostate cancer status post resection, remote Sciatica with right sided muscle wasting secondary to slipped disk Complete rotator cuff tear of left shoulder Osteoarthritis Remainder reviewed in history tab.   Pertinent Past Surgical History: Left shoulder arthroscopy (04/2023) Right total knee arthroplasty (2021) Cardiac cath (distal RCA stent, 1999) Anterior cervical decompression (2013) Remainder reviewed in history tab.   Pertinent Social History: Tobacco use: Former and quit in 2000, 20 pack years Alcohol use: Occasional Other Substance use: Denies Lives with wife, has a daughter who is present in room  Pertinent Family History: Father: MI, 42 Mother: Breast CA Sister: Oral cancer Daughter: Breast cancer Remainder reviewed in history tab.   Important Outpatient Medications: Amlodipine 10 mg daily Atorvastatin 80 mg daily Aspirin 81 mg daily Ferrous sulfate 325 mg daily Nitroglycerin 0.4 mg as needed for chest pain Omega-3, potassium, B12, multivitamin supplementation Remainder reviewed in medication history.   Objective: BP 138/88   Pulse 67   Temp 98.1 F (36.7 C) (Oral)   Resp 18   SpO2 100%  Exam: General: Well-appearing older male laying in bed, no acute distress, conversant and appropriate Eyes: Non-icteric Cardiovascular: Regular rate and rhythm, no murmurs rubs or gallops Respiratory: Clear to auscultation bilaterally, no wheezes or rhonchi or rales Gastrointestinal: No tenderness to  palpation, bowel sounds present MSK: Limited ROM in left upper and right lower extremity secondary to pain Derm: No obvious lesions Neuro:   Mental status: Patient alert and oriented to situation, self, responsive to questions, alert, attentive followed verbal commands without issue.  Language intact.  No slurring noted.  CN: II: Visual fields intact III-VI: EOMI, PERRL V: Facial sensation intact bilaterally VII: Facial movement intact bilaterally, smile symmetrical VIII: Patient auditory signs grossly intact IX - X: Soft palate raises bilaterally XII: Tongue protrusion intact  Sensation: Sensation intact and equal in bilateral upper and lower extremities Strength: Patient strength 5 out of 5 in all extremities, ROM limited to pain in left shoulder and right hip, baseline  Psych: Appropriate mood and affect, friendly  Labs:  CBC BMET  Recent Labs  Lab 11/03/23 1138 11/03/23 1216  WBC 5.2  --   HGB 12.7* 12.2*  HCT 36.7* 36.0*  PLT 229  --    Recent Labs  Lab 11/03/23 1138 11/03/23 1216  NA 137 139  K 3.7 3.8  CL 103 104  CO2 26  --   BUN 16 17  CREATININE 1.40* 1.50*  GLUCOSE 101* 97  CALCIUM 8.9  --     Pertinent additional labs   CBC diff: WNL Cr. 1.5 (BL ~0.9)  EKG: Sinus rhythm, QTc 400  Imaging Studies Performed:  CTA Angiography Head and Neck (4/5):  1. High-grade stenosis of the right P2 segment with attenuation of distal PCA branch vessels. 2. No other significant proximal stenosis, aneurysm, or branch vessel occlusion within the Circle of Willis. 3. Atherosclerotic changes at the carotid bifurcations bilaterally and cavernous internal carotid arteries bilaterally without significant stenosis. 4. Solid fusion at  C3-4 and C4-5. 5. Facet hypertrophy contributes to foraminal stenosis bilaterally at C3-4.  MRI brain without contrast (4/5): Awaiting  Tomie China, MD 11/03/2023, 2:04 PM PGY-1, [facility] Family Medicine  FPTS Intern  pager: [phone], text pages welcome Secure chat group [facility] Teaching Service    I agree with the assessment and plan as documented above.  Janeal Holmes, MD PGY-2, [facility] Medicine

## 2023-11-03 NOTE — Progress Notes (Signed)
 OT Cancellation Note  Patient Details Name: GIORGIO CHABOT MRN: [id number] DOB: 1950-10-20   Cancelled Treatment:    Reason Eval/Treat Not Completed: Patient at procedure or test/ unavailable (Pt at MRI, OT will follow-up with pt as able)  11/03/2023  AB, OTR/L  Acute Rehabilitation Services  Office: [phone]   Tristan Schroeder 11/03/2023, 5:06 PM

## 2023-11-03 NOTE — ED Triage Notes (Addendum)
 Pt BIB [facility] from home d/t possible TIA. Pt had an episode where he had a sudden onset of L weakness, numbness and tingling , also lack of coordination. Pt woke up a bad headache. Symptoms are all resolved upon EMS arrival. A&O X4. VSS. LKN 940 . L arm drift d/t previous surgery.   BP 148/85 CBG 116 HR 70  99% RA

## 2023-11-03 NOTE — Consult Note (Signed)
 NEUROLOGY CONSULT NOTE   Date of service: November 03, 2023 Patient Name: Edward [name] MRN:  [id number] DOB:  03-29-1951 Chief Complaint: "Left side weakness " Requesting Provider: Linwood Dibbles, MD  History of Present Illness  Edward [name] is a 73 y.o. male with hx of HTN, HLD, prostate cancer, CAD s/p MI, sciatica pain, who presents to [facility] Ed via EMS for evaluation of left arm and leg weakness. Patient states he woke at 0600 in his normal state of health and at 1010 while sitting at his computer when he suddenly developed left arm and leg weakness and was unable to get up. He endorses having a headache for the last few days. He denies any facial droop, slurred speech or vision changes.  CT with no acute abnormalities. CTA with High-grade stenosis of the right P2 segment   LKW: 1010 Modified rankin score: 0-Completely asymptomatic and back to baseline post- stroke IV Thrombolysis: No mild symptoms  EVT:  No LVO   NIHSS components Score: Comment  1a Level of Conscious 0[x]  1[]  2[]  3[]      1b LOC Questions 0[x]  1[]  2[]       1c LOC Commands 0[x]  1[]  2[]       2 Best Gaze 0[x]  1[]  2[]       3 Visual 0[x]  1[]  2[]  3[]      4 Facial Palsy 0[x]  1[]  2[]  3[]      5a Motor Arm - left 0[x]  1[]  2[]  3[]  4[]  UN[]  Rotator cuff injury, weak left grip  5b Motor Arm - Right 0[x]  1[]  2[]  3[]  4[]  UN[]    6a Motor Leg - Left 0[x]  1[]  2[]  3[]  4[]  UN[]    6b Motor Leg - Right 0[x]  1[]  2[]  3[]  4[]  UN[]  Old weakness due to sciatica pain   7 Limb Ataxia 0[x]  1[]  2[]  3[]  UN[]   Slowed fine motor skills on left   8 Sensory 0[]  1[x]  2[]  UN[]    Decreased on right due to sciatica -old  61 Best Language 0[x]  1[]  2[]  3[]      10 Dysarthria 0[x]  1[]  2[]  UN[]      11 Extinct. and Inattention 0[x]  1[]  2[]       TOTAL: 1      ROS   Comprehensive ROS performed and pertinent positives documented in HPI    Past History   Past Medical History:  Diagnosis Date   Arthritis    Chicken pox    Coronary artery disease    a.  1999 s/p MI with cath/PCI;  b. 05/1999 Ex Cardiolite EF 68%, no ishcemia.   Dyslipidemia    Family history of breast cancer    Family history of oral cancer    History of tobacco abuse    Hypertension    Hypokalemia    Injury of cervical spine (HCC)    a. 02/2012 C4/5   Measles    Mumps    Myocardial infarction [facility])    Nasal bones, closed fracture    a. 02/2012 in setting of presyncope/fall   Near syncope    Pneumonia    pt denies   Prostate cancer [facility])    Sciatica    Whooping cough     Past Surgical History:  Procedure Laterality Date   ACHILLES TENDON SURGERY  08/01/1987   ANTERIOR CERVICAL DECOMP/DISCECTOMY FUSION  03/13/2012   Procedure: ANTERIOR CERVICAL DECOMPRESSION/DISCECTOMY FUSION 1 LEVEL;  Surgeon: Maeola Harman, MD;  Location: [facility] NEURO ORS;  Service: Neurosurgery;  Laterality: N/A;  Anterior Cervical Decompression/Fusion. Cervical four-five.   CARDIAC  CATHETERIZATION  06/28/1998   single vessel CAD involving the distal RCA/PTCA and stenting of the distal RCA//EF- 50-55%   CLOSED REDUCTION NASAL FRACTURE  03/13/2012   Procedure: CLOSED REDUCTION NASAL FRACTURE;  Surgeon: Wayland Denis, DO;  Location: [facility] NEURO ORS;  Service: Plastics;  Laterality: N/A;  Internal and external splinting of nasal fracture   eye vein bleed      leaky vein on retina   LYMPHADENECTOMY Bilateral 02/14/2018   Procedure: [facility];  Surgeon: Heloise Purpura, MD;  Location: [facility] ORS;  Service: Urology;  Laterality: Bilateral;   NM MYOVIEW LTD  05/17/2009   normal stress nuclear study/no evidence of ischemia/EF- 68%   petalla tendon surgery  08/01/1987   ROBOT ASSISTED LAPAROSCOPIC RADICAL PROSTATECTOMY N/A 02/14/2018   Procedure: XI ROBOTIC ASSISTED LAPAROSCOPIC RADICAL PROSTATECTOMY;  Surgeon: Heloise Purpura, MD;  Location: [facility] ORS;  Service: Urology;  Laterality: N/A;   SHOULDER ARTHROSCOPY WITH OPEN ROTATOR CUFF REPAIR Left 04/09/2023   Procedure: SHOULDER ARTHROSCOPY WITH  SUBACROMIAL DECOMPRESSION, DISTAL CLAVICAL RESECTION, BICEPS TENOTOMY, EXTENSIVE DEBRIDMENT OF ROTATOR CUFF TEAR;  Surgeon: Jodi Geralds, MD;  Location: [facility] ORS;  Service: Orthopedics;  Laterality: Left;   TOTAL KNEE ARTHROPLASTY Right 09/26/2019   Procedure: TOTAL KNEE ARTHROPLASTY;  Surgeon: Jodi Geralds, MD;  Location: [facility] ORS;  Service: Orthopedics;  Laterality: Right;    Family History: Family History  Problem Relation Age of Onset   Heart attack Father        78   Breast cancer Mother        dx late 75s, d. 41   Cancer Sister        oral cancer dx 58   Breast cancer Daughter        dx. 38, currently being treated    Social History  reports that he quit smoking about 35 years ago. His smoking use included cigarettes. He started smoking about 55 years ago. He has a 20 pack-year smoking history. He has never used smokeless tobacco. He reports that he does not currently use alcohol. He reports that he does not use drugs.  No Known Allergies  Medications  No current facility-administered medications for this encounter.  Current Outpatient Medications:    amLODipine (NORVASC) 10 MG tablet, Take 1 tablet (10 mg total) by mouth daily., Disp: 90 tablet, Rfl: 2   aspirin 81 MG chewable tablet, Chew 81 mg by mouth daily in the afternoon., Disp: , Rfl:    atorvastatin (LIPITOR) 80 MG tablet, Take 80 mg by mouth daily., Disp: , Rfl:    Cholecalciferol (VITAMIN D) 50 MCG (2000 UT) CAPS, Take 2,000 Units by mouth daily., Disp: , Rfl:    docusate sodium (COLACE) 100 MG capsule, Take 100 mg by mouth daily., Disp: , Rfl:    ferrous sulfate 325 (65 FE) MG tablet, Take 325 mg by mouth daily with breakfast., Disp: , Rfl:    Magnesium 250 MG TABS, Take 250 mg by mouth daily., Disp: , Rfl:    meloxicam (MOBIC) 15 MG tablet, Take 15 mg by mouth daily., Disp: , Rfl:    Multiple Vitamin (MULTIVITAMIN) tablet, Take 1 tablet by mouth daily. 50+, Disp: , Rfl:    nitroGLYCERIN (NITROSTAT) 0.4 MG SL tablet,  Dissolve 1 tablet under the tongue every 5 minutes as needed for chest pain. Max of 3 doses, then 911., Disp: 25 tablet, Rfl: 6   Omega-3 Fatty Acids (FISH OIL) 1200 MG CAPS, Take 1,200 mg by mouth daily., Disp: , Rfl:  Potassium 99 MG TABS, Take 99 mg by mouth daily., Disp: , Rfl:    vitamin B-12 (CYANOCOBALAMIN) 1000 MCG tablet, Take 1,000 mcg by mouth daily., Disp: , Rfl:   Vitals   Vitals:   11/03/23 1123 11/03/23 1124 11/03/23 1125 11/03/23 1145  BP: (!) 142/89   138/88  Pulse: 66 66  67  Resp: 14 14  18   Temp:   98.1 F (36.7 C)   TempSrc:   Oral   SpO2: 99% 100%  100%    There is no height or weight on file to calculate BMI.  Physical Exam   Constitutional: Appears well-developed and well-nourished.  Psych: Affect appropriate to situation.  Eyes: No scleral injection.  HENT: No OP obstruction.  Head: Normocephalic.  Cardiovascular: Normal rate and regular rhythm.  Respiratory: Effort normal, non-labored breathing.  GI: Soft.  No distension. There is no tenderness.  Skin: WDI.   Neurologic Examination   Mental Status -  Level of arousal and orientation to time, place, and person were intact. Language including expression, naming, repetition, comprehension was assessed and found intact. Attention span and concentration were normal. Recent and remote memory were intact. Fund of Knowledge was assessed and was intact.  Cranial Nerves II - XII - II - Visual field intact OU. III, IV, VI - Extraocular movements intact. V - Facial sensation intact bilaterally. VII - Facial movement intact bilaterally. VIII - Hearing & vestibular intact bilaterally. X - Palate elevates symmetrically. XI - Chin turning & shoulder shrug intact bilaterally. XII - Tongue protrusion intact.  Motor Strength - Right arm 5/5, left arm 4/5 (rotator cuff injury), weak left grip, right leg 4/5, left 4/5. Bulk was normal and fasciculations were absent.   Motor Tone - Muscle tone was assessed at  the neck and appendages and was normal . Sensory - decreased on the lateral aspect of right leg (old) Coordination - The patient had normal movements in the hands and feet with no ataxia or dysmetria. Decreased fine motor skills on left hand  Gait and Station - deferred.  Labs/Imaging/Neurodiagnostic studies   CBC:  Recent Labs  Lab Nov 18, 2023 0940 11/03/23 1138 11/03/23 1216  WBC 7.0 5.2  --   NEUTROABS  --  3.0  --   HGB 13.4 12.7* 12.2*  HCT 40.6 36.7* 36.0*  MCV 96.7 93.4  --   PLT 248 229  --    Basic Metabolic Panel:  Lab Results  Component Value Date   NA 139 11/03/2023   K 3.8 11/03/2023   CO2 26 11/03/2023   GLUCOSE 97 11/03/2023   BUN 17 11/03/2023   CREATININE 1.50 (H) 11/03/2023   CALCIUM 8.9 11/03/2023   GFRNONAA 53 (L) 11/03/2023   GFRAA 90 07/29/2020   Lipid Panel:  Lab Results  Component Value Date   LDLCALC 72 01/16/2023   HgbA1c: No results found for: "HGBA1C" Urine Drug Screen: No results found for: "LABOPIA", "COCAINSCRNUR", "LABBENZ", "AMPHETMU", "THCU", "LABBARB"  Alcohol Level No results found for: "ETH" INR  Lab Results  Component Value Date   INR 1.1 09/19/2019   APTT  Lab Results  Component Value Date   APTT 32 09/19/2019   AED levels: No results found for: "PHENYTOIN", "ZONISAMIDE", "LAMOTRIGINE", "LEVETIRACETA"  CT Head without contrast(Personally reviewed): No acute abnormality   CT angio Head and Neck with contrast(Personally reviewed): High-grade stenosis of the right P2 segment   ASSESSMENT   TAYSHAWN PURNELL is a 73 y.o. male hx of HTN, HLD, prostate  cancer, CAD s/p MI, sciatica pain, who presents to [facility] via EMS for evaluation of episode of sudden onset left arm and leg weakness with spontaneous resolution.  RECOMMENDATIONS  - admit for Stroke/TIA workup  - HgbA1c, fasting lipid panel - MRI of the brain without contrast - Frequent neuro checks - Echocardiogram - CTA head and neck - Prophylactic therapy-Antiplatelet  med: Aspirin - dose 81mg  and plavix 75mg  daily  - Risk factor modification - Telemetry monitoring - PT consult, OT consult, Speech consult - Stroke team to follow ______________________________________________________________________    Signed, Mathews Argyle, NP [facility]  NEUROHOSPITALIST ADDENDUM Performed a face to face diagnostic evaluation.   I have reviewed the contents of history and physical exam as documented by PA/ARNP/Resident and agree with above documentation.  I have discussed and formulated the above plan as documented. Edits to the note have been made as needed.  Impression/Key exam findings/Plan: sudden onset brief L arm and L leg weakness at 1010AM while sitting on the couch. Unable to get up, daughter attempted and could not get him up. L arm was numb and flopping around. Symptoms resolved in 7-10 mins. EMS called and symptoms had resolved prior to arrival of EMS. Brought in to the ED. Episode is most consistent with potential stroke/TIA. Risk factors include hx of HTN and HLD. Unlikely for C spine to cause sudden onset numbness weakness while sitting with spontaneous and full resolution.  Admit for TIA/stroke workup as above.  Erick Blinks, MD [facility] [phone]   If 7pm to 7am, please call on call as listed on AMION.

## 2023-11-03 NOTE — Assessment & Plan Note (Signed)
 BP acceptable.  Holding amlodipine while awaiting MRI to confirm no stroke. -Add back home amlodipine as indicated

## 2023-11-04 ENCOUNTER — Telehealth: Payer: Self-pay | Admitting: Physician Assistant

## 2023-11-04 ENCOUNTER — Observation Stay (HOSPITAL_BASED_OUTPATIENT_CLINIC_OR_DEPARTMENT_OTHER)

## 2023-11-04 DIAGNOSIS — Z7902 Long term (current) use of antithrombotics/antiplatelets: Secondary | ICD-10-CM | POA: Diagnosis not present

## 2023-11-04 DIAGNOSIS — G459 Transient cerebral ischemic attack, unspecified: Secondary | ICD-10-CM | POA: Diagnosis not present

## 2023-11-04 DIAGNOSIS — R29700 NIHSS score 0: Secondary | ICD-10-CM

## 2023-11-04 DIAGNOSIS — E785 Hyperlipidemia, unspecified: Secondary | ICD-10-CM

## 2023-11-04 DIAGNOSIS — I639 Cerebral infarction, unspecified: Secondary | ICD-10-CM | POA: Diagnosis not present

## 2023-11-04 DIAGNOSIS — I63531 Cerebral infarction due to unspecified occlusion or stenosis of right posterior cerebral artery: Secondary | ICD-10-CM | POA: Diagnosis not present

## 2023-11-04 DIAGNOSIS — Z87891 Personal history of nicotine dependence: Secondary | ICD-10-CM

## 2023-11-04 DIAGNOSIS — Z7982 Long term (current) use of aspirin: Secondary | ICD-10-CM

## 2023-11-04 LAB — CBC
HCT: 40.2 % (ref 39.0–52.0)
Hemoglobin: 14 g/dL (ref 13.0–17.0)
MCH: 32.1 pg (ref 26.0–34.0)
MCHC: 34.8 g/dL (ref 30.0–36.0)
MCV: 92.2 fL (ref 80.0–100.0)
Platelets: 251 10*3/uL (ref 150–400)
RBC: 4.36 MIL/uL (ref 4.22–5.81)
RDW: 12.8 % (ref 11.5–15.5)
WBC: 4.5 10*3/uL (ref 4.0–10.5)
nRBC: 0 % (ref 0.0–0.2)

## 2023-11-04 LAB — ECHOCARDIOGRAM COMPLETE
Area-P 1/2: 2.99 cm2
S' Lateral: 2.6 cm

## 2023-11-04 LAB — BASIC METABOLIC PANEL WITH GFR
Anion gap: 10 (ref 5–15)
BUN: 17 mg/dL (ref 8–23)
CO2: 24 mmol/L (ref 22–32)
Calcium: 9.1 mg/dL (ref 8.9–10.3)
Chloride: 106 mmol/L (ref 98–111)
Creatinine, Ser: 0.98 mg/dL (ref 0.61–1.24)
GFR, Estimated: >60 mL/min (ref >60)
Glucose, Bld: 101 mg/dL — ABNORMAL HIGH (ref 70–99)
Potassium: 3.5 mmol/L (ref 3.5–5.1)
Sodium: 140 mmol/L (ref 135–145)

## 2023-11-04 MED ORDER — CLOPIDOGREL BISULFATE 75 MG PO TABS: 75.0000 mg | ORAL_TABLET | Freq: Every day | ORAL | 0 refills | Status: AC

## 2023-11-04 MED ORDER — ASPIRIN 81 MG PO CHEW
81.0000 mg | CHEWABLE_TABLET | Freq: Every day | ORAL | 0 refills | Status: AC
Start: 2023-11-04 — End: 2023-11-23

## 2023-11-04 NOTE — Progress Notes (Signed)
 SLP Cancellation Note  Patient Details Name: ELDRA WORD MRN: [id number] DOB: 26-Jan-1951   Cancelled treatment:       Reason Eval/Treat Not Completed: Pt passed [place], started on diet, tolerating well. No needs for swallow eval identified, will d/c swallow eval order   Demetrica Zipp, Riley Nearing 11/04/2023, 7:31 AM

## 2023-11-04 NOTE — Hospital Course (Addendum)
 Edward [name] is a 73 y.o.male with a history of HTN, prostate cancer in remission, CAD c/b MI (cath 1999), HLD, C-spine injury (2013, C4/C5), left rotator cuff arthritis, sciatica  who was admitted to the [facility] Medicine Teaching Service at [facility] for sudden onset left upper and lower limb weakness. His hospital course is detailed below:  Stroke Presented with transient left-sided upper and lower extremity weakness which resolved after 30 minutes.  CT did not reflect any acute changes however MRI brain revealed 10 mm acute infarct in posterior right occipital lobe.  Neurology initiated Plavix 75 mg daily and continued aspirin and atorvastatin.  Risk stratification labs were not outstanding.  Echocardiogram showed low risk for embolic cause of stroke.  Neurology recommended 21 days of aspirin and Plavix followed by Plavix indefinitely.  Patient was discharged with instructions for 30-day cardiac monitor to received in the mail and follow-up with cardiology as well as neurology as outpatient stroke team.  AKI Presented with creatinine 1.53 with baseline approximately 1.  He received IV fluids and the next morning creatinine back to baseline.  Other chronic conditions were medically managed with home medications and formulary alternatives as necessary (iron deficiency anemia, CAD, HLD)  PCP Follow-up Recommendations: Recommended outpatient PT referral for strength, balance, and functional mobility 3 times per week. Ensure cardiology follow-up, stroke clinic follow-up.

## 2023-11-04 NOTE — Progress Notes (Signed)
  Echocardiogram 2D Echocardiogram has been performed.  Edward [name] 11/04/2023, 12:06 PM

## 2023-11-04 NOTE — Progress Notes (Signed)
     Daily Progress Note Intern Pager: [phone]  Patient name: Edward [name] Medical record number: [id number] Date of birth: 1950/11/27 Age: 73 y.o. Gender: male  Primary Care Provider: Caffie Damme, MD Consultants: Neurology Code Status: Full  Pt Overview and Major Events to Date:  11/03/23: Admitted  Assessment and Plan: Edward [name] is a 73 y.o. male presenting with right posterior occipital lobe stroke. Pertinent PMH/PSH includes HTN, prostate cancer in remission, CAD c/b MI (cath 1999), HLD, C-spine injury (2013, C4/C5), left rotator cuff arthritis, sciatica .  Assessment & Plan Stroke [facility]) in right occipital lobe MRI brain showed 10 mm acute infarct in the posterior right occipital lobe.  Versus CVA.  CT angiogram negative for acute process though with high-grade stenosis of P2.  No evidence of carotid stenosis though with atherosclerosis. Neuro exam intact on exam, no residual deficits, passed bedside swallow screen.  -Neurology following, appreciate recommendations -Follow-up echocardiogram -Continue Aspirin 81 milligrams daily, Plavix 75 mg daily -PT/OT/SLP eval and treat -Vital signs -Neurochecks every 4 hours AKI (acute kidney injury) (HCC) Creatinine 0.98.  AKI resolved.  Discontinue IV fluids. Hypertension BP acceptable.  Home medications include amlodipine 10 mg daily. -Add back home amlodipine as indicated Chronic health problem IDA: Continue ferrous sulfate 325 daily CAD: Aspirin as above, continue home PRN nitroglycerin 0.4 mg per chest pain HLD: Continue atorvastatin 80 mg daily Home supplements: Continue vitamin D, B12, multivitamin, magnesium  FEN/GI: Heart healthy PPx: Lovenox Dispo:Pending PT recommendations .    Subjective:  Patient notes he is doing well, he does not have any residual dizziness or nausea.  He was able to eat all of his meals.  He does not have any concerns and is aware that today's plan is echo and working with physical therapy  and perhaps going home.  Objective: Temp:  [97.9 F (36.6 C)-98.1 F (36.7 C)] 97.9 F (36.6 C) (04/05 1850) Pulse Rate:  [66-86] 67 (04/05 2100) Resp:  [11-18] 18 (04/05 2100) BP: (122-145)/(83-97) 145/94 (04/05 2100) SpO2:  [99 %-100 %] 100 % (04/05 2100) Physical Exam: General: Well-appearing, NAD Cardiovascular: RRR, murmurs auscultated Respiratory: CTAB Neuro: A&O x 4, CN II through XII intact, strength normal and sensation intact of bilateral upper and lower extremities, no focal neurological deficits appreciable  Laboratory: Most recent CBC Lab Results  Component Value Date   WBC 5.2 11/03/2023   HGB 12.2 (L) 11/03/2023   HCT 36.0 (L) 11/03/2023   MCV 93.4 11/03/2023   PLT 229 11/03/2023   Most recent BMP    Latest Ref Rng & Units 11/03/2023   12:16 PM  BMP  Glucose 70 - 99 mg/dL 97   BUN 8 - 23 mg/dL 17   Creatinine 3.08 - 1.24 mg/dL 6.57   Sodium 846 - 962 mmol/L 139   Potassium 3.5 - 5.1 mmol/L 3.8   Chloride 98 - 111 mmol/L 104    Imaging/Diagnostic Tests: CTA head/neck: High-grade stenosis of the right P2 segment of distal PCA branch vessels MRI brain: 10 mm acute infarct in the posterior right occipital lobe  [name] Mattocks, DO 11/04/2023, 1:30 AM  PGY-3, [facility] FPTS Intern pager: [phone], text pages welcome Secure chat group [facility] Teaching Service

## 2023-11-04 NOTE — Plan of Care (Signed)
 Problem: Education: Goal: Knowledge of General Education information will improve Description: Including pain rating scale, medication(s)/side effects and non-pharmacologic comfort measures 11/04/2023 1522 by Joanne Chars, RN Outcome: Progressing 11/04/2023 1522 by Joanne Chars, RN Outcome: Progressing 11/04/2023 1520 by Joanne Chars, RN Outcome: Progressing   Problem: Health Behavior/Discharge Planning: Goal: Ability to manage health-related needs will improve 11/04/2023 1522 by Joanne Chars, RN Outcome: Progressing 11/04/2023 1522 by Joanne Chars, RN Outcome: Progressing 11/04/2023 1520 by Joanne Chars, RN Outcome: Progressing   Problem: Clinical Measurements: Goal: Ability to maintain clinical measurements within normal limits will improve 11/04/2023 1522 by Joanne Chars, RN Outcome: Progressing 11/04/2023 1522 by Joanne Chars, RN Outcome: Progressing 11/04/2023 1520 by Joanne Chars, RN Outcome: Progressing Goal: Will remain free from infection 11/04/2023 1522 by Joanne Chars, RN Outcome: Progressing 11/04/2023 1522 by Joanne Chars, RN Outcome: Progressing 11/04/2023 1520 by Joanne Chars, RN Outcome: Progressing Goal: Diagnostic test results will improve 11/04/2023 1522 by Joanne Chars, RN Outcome: Progressing 11/04/2023 1522 by Joanne Chars, RN Outcome: Progressing 11/04/2023 1520 by Joanne Chars, RN Outcome: Progressing Goal: Respiratory complications will improve 11/04/2023 1522 by Joanne Chars, RN Outcome: Progressing 11/04/2023 1522 by Joanne Chars, RN Outcome: Progressing 11/04/2023 1520 by Joanne Chars, RN Outcome: Progressing Goal: Cardiovascular complication will be avoided 11/04/2023 1522 by Joanne Chars, RN Outcome: Progressing 11/04/2023 1522 by Joanne Chars, RN Outcome: Progressing 11/04/2023 1520 by Joanne Chars, RN Outcome: Progressing   Problem: Activity: Goal: Risk for activity intolerance will decrease 11/04/2023 1522 by Joanne Chars, RN Outcome:  Progressing 11/04/2023 1522 by Joanne Chars, RN Outcome: Progressing 11/04/2023 1520 by Joanne Chars, RN Outcome: Progressing   Problem: Nutrition: Goal: Adequate nutrition will be maintained 11/04/2023 1522 by Joanne Chars, RN Outcome: Progressing 11/04/2023 1522 by Joanne Chars, RN Outcome: Progressing 11/04/2023 1520 by Joanne Chars, RN Outcome: Progressing   Problem: Coping: Goal: Level of anxiety will decrease 11/04/2023 1522 by Joanne Chars, RN Outcome: Progressing 11/04/2023 1522 by Joanne Chars, RN Outcome: Progressing 11/04/2023 1520 by Joanne Chars, RN Outcome: Progressing   Problem: Elimination: Goal: Will not experience complications related to bowel motility 11/04/2023 1522 by Joanne Chars, RN Outcome: Progressing 11/04/2023 1522 by Joanne Chars, RN Outcome: Progressing 11/04/2023 1520 by Joanne Chars, RN Outcome: Progressing Goal: Will not experience complications related to urinary retention 11/04/2023 1522 by Joanne Chars, RN Outcome: Progressing 11/04/2023 1522 by Joanne Chars, RN Outcome: Progressing 11/04/2023 1520 by Joanne Chars, RN Outcome: Progressing   Problem: Pain Managment: Goal: General experience of comfort will improve and/or be controlled 11/04/2023 1522 by Joanne Chars, RN Outcome: Progressing 11/04/2023 1522 by Joanne Chars, RN Outcome: Progressing 11/04/2023 1520 by Joanne Chars, RN Outcome: Progressing   Problem: Safety: Goal: Ability to remain free from injury will improve 11/04/2023 1522 by Joanne Chars, RN Outcome: Progressing 11/04/2023 1522 by Joanne Chars, RN Outcome: Progressing 11/04/2023 1520 by Joanne Chars, RN Outcome: Progressing   Problem: Skin Integrity: Goal: Risk for impaired skin integrity will decrease 11/04/2023 1522 by Joanne Chars, RN Outcome: Progressing 11/04/2023 1522 by Joanne Chars, RN Outcome: Progressing 11/04/2023 1520 by Joanne Chars, RN Outcome: Progressing   Problem: Education: Goal: Knowledge of disease  or condition will improve 11/04/2023 1522 by Joanne Chars, RN Outcome: Progressing 11/04/2023 1522 by Joanne Chars, RN Outcome: Progressing Goal: Knowledge of secondary prevention will improve (MUST DOCUMENT ALL) 11/04/2023 1522 by Joanne Chars, RN Outcome: Progressing 11/04/2023 1522 by Joanne Chars, RN Outcome: Progressing Goal: Knowledge of patient specific risk factors will improve (DELETE if not current risk factor) 11/04/2023  1522 by Joanne Chars, RN Outcome: Progressing 11/04/2023 1522 by Joanne Chars, RN Outcome: Progressing   Problem: Ischemic Stroke/TIA Tissue Perfusion: Goal: Complications of ischemic stroke/TIA will be minimized 11/04/2023 1522 by Joanne Chars, RN Outcome: Progressing 11/04/2023 1522 by Joanne Chars, RN Outcome: Progressing   Problem: Coping: Goal: Will verbalize positive feelings about self 11/04/2023 1522 by Joanne Chars, RN Outcome: Progressing 11/04/2023 1522 by Joanne Chars, RN Outcome: Progressing Goal: Will identify appropriate support needs 11/04/2023 1522 by Joanne Chars, RN Outcome: Progressing 11/04/2023 1522 by Joanne Chars, RN Outcome: Progressing   Problem: Health Behavior/Discharge Planning: Goal: Ability to manage health-related needs will improve 11/04/2023 1522 by Joanne Chars, RN Outcome: Progressing 11/04/2023 1522 by Joanne Chars, RN Outcome: Progressing Goal: Goals will be collaboratively established with patient/family 11/04/2023 1522 by Joanne Chars, RN Outcome: Progressing 11/04/2023 1522 by Joanne Chars, RN Outcome: Progressing   Problem: Self-Care: Goal: Ability to participate in self-care as condition permits will improve 11/04/2023 1522 by Joanne Chars, RN Outcome: Progressing 11/04/2023 1522 by Joanne Chars, RN Outcome: Progressing Goal: Verbalization of feelings and concerns over difficulty with self-care will improve 11/04/2023 1522 by Joanne Chars, RN Outcome: Progressing 11/04/2023 1522 by Joanne Chars, RN Outcome:  Progressing Goal: Ability to communicate needs accurately will improve 11/04/2023 1522 by Joanne Chars, RN Outcome: Progressing 11/04/2023 1522 by Joanne Chars, RN Outcome: Progressing   Problem: Nutrition: Goal: Risk of aspiration will decrease 11/04/2023 1522 by Joanne Chars, RN Outcome: Progressing 11/04/2023 1522 by Joanne Chars, RN Outcome: Progressing Goal: Dietary intake will improve 11/04/2023 1522 by Joanne Chars, RN Outcome: Progressing 11/04/2023 1522 by Joanne Chars, RN Outcome: Progressing

## 2023-11-04 NOTE — Plan of Care (Signed)

## 2023-11-04 NOTE — Progress Notes (Signed)
 STROKE TEAM PROGRESS NOTE   BRIEF HPI [name] is a 73 y.o. male with history of HTN, HLD, prostate cancer, CAD s/p MI, sciatic pain presenting with sudden left arm and leg weakness on 4/5 in addition to a few days of headache complaints.   SIGNIFICANT HOSPITAL EVENTS 4/5:  - Presented to ED d/t acute onset of left-sided weakness with spontaneous resolution. NIH: 1 for sensory deficit on neurological team assessment. - MRI brain with 10 mm acute infarct in the posterior right occipital lobe.  - CTA with high-grade stenosis of the right P2 segment with attenuation of distal PCA branch vessels.   INTERIM HISTORY/SUBJECTIVE  No family at bedside. On exam today, patient reports resolution of presenting symptoms and back to his baseline neurologic and functional status.  Vital signs stable overnight.  Patient reports that he was running errands yesterday and putting groceries away when he had a sudden onset of a headache and he sat down because he was feeling badly.  He then noticed his bottom lip was tingling and when he tried to get up from the table to go sit on the couch he felt like his hand was mush/rubbery.  He also could not stand on his "good" left leg and called his daughter stating that his "left side was gone" requiring EMS transport to the hospital.  The symptoms lasted approximately 30 to 40 minutes prior to resolution.  OBJECTIVE CBC    Component Value Date/Time   WBC 4.5 11/04/2023 0452   RBC 4.36 11/04/2023 0452   HGB 14.0 11/04/2023 0452   HCT 40.2 11/04/2023 0452   PLT 251 11/04/2023 0452   MCV 92.2 11/04/2023 0452   MCH 32.1 11/04/2023 0452   MCHC 34.8 11/04/2023 0452   RDW 12.8 11/04/2023 0452   LYMPHSABS 1.5 11/03/2023 1138   MONOABS 0.5 11/03/2023 1138   EOSABS 0.1 11/03/2023 1138   BASOSABS 0.0 11/03/2023 1138   BMET    Component Value Date/Time   NA 140 11/04/2023 0452   NA 142 01/16/2023 1230   K 3.5 11/04/2023 0452   CL 106 11/04/2023 0452    CO2 24 11/04/2023 0452   GLUCOSE 101 (H) 11/04/2023 0452   BUN 17 11/04/2023 0452   BUN 21 01/16/2023 1230   CREATININE 0.98 11/04/2023 0452   CALCIUM 9.1 11/04/2023 0452   EGFR 89 01/16/2023 1230   GFRNONAA >60 11/04/2023 0452   Lab Results  Component Value Date   HGBA1C 5.6 11/03/2023   Lab Results  Component Value Date   CHOL 124 11/03/2023   HDL 37 (L) 11/03/2023   LDLCALC 68 11/03/2023   LDLDIRECT 81.6 07/01/2014   TRIG 93 11/03/2023   CHOLHDL 3.4 11/03/2023   IMAGING past 24 hours MR BRAIN WO CONTRAST Result Date: 11/03/2023 CLINICAL DATA:  TIA. Episode of sudden onset left-sided weakness, numbness and tingling which subsequently resolved. EXAM: MRI HEAD WITHOUT CONTRAST TECHNIQUE: Multiplanar, multiecho pulse sequences of the brain and surrounding structures were obtained without intravenous contrast. COMPARISON:  CT angio head neck 11/03/2023 FINDINGS: Brain: A 10 mm acute infarct is present in the posterior right occipital lobe. T2 and FLAIR signal changes are associated. Mild atrophy and white matter changes are present otherwise. No other acute infarct is present. The ventricles are proportionate to the degree of atrophy. Deep brain nuclei are within normal limits. No significant extraaxial fluid collection is present. The brainstem and cerebellum are within normal limits. The internal auditory canals are within normal limits. Midline  structures are within normal limits. Vascular: Flow is present in the major intracranial arteries. Skull and upper cervical spine: The craniocervical junction is normal. The cervical spine is fused at C3-4. The ventral CSF is effaced at this level. Marrow signal is normal. Sinuses/Orbits: The paranasal sinuses and mastoid air cells are clear. The globes and orbits are within normal limits. IMPRESSION: 1. 10 mm acute infarct in the posterior right occipital lobe. 2. Mild atrophy and white matter disease. This likely reflects the sequela of chronic  microvascular ischemia. 3. Fusion of the cervical spine at C3-4. These results were called by telephone at the time of interpretation on 11/03/2023 at 6:04 pm to provider Dr. Derry Lory, who verbally acknowledged these results. Electronically Signed   By: Marin Roberts M.D.   On: 11/03/2023 18:07   CT ANGIO HEAD NECK W WO CM Result Date: 11/03/2023 CLINICAL DATA:  Stroke/TIA. Episode involving sudden onset of left-sided weakness, numbness and tingling. Symptoms have since resolved. EXAM: CT ANGIOGRAPHY HEAD AND NECK WITH AND WITHOUT CONTRAST TECHNIQUE: Multidetector CT imaging of the head and neck was performed using the standard protocol during bolus administration of intravenous contrast. Multiplanar CT image reconstructions and MIPs were obtained to evaluate the vascular anatomy. Carotid stenosis measurements (when applicable) are obtained utilizing NASCET criteria, using the distal internal carotid diameter as the denominator. RADIATION DOSE REDUCTION: This exam was performed according to the departmental dose-optimization program which includes automated exposure control, adjustment of the mA and/or kV according to patient size and/or use of iterative reconstruction technique. CONTRAST:  75mL OMNIPAQUE IOHEXOL 350 MG/ML SOLN COMPARISON:  CT head without contrast 03/10/2012 FINDINGS: CT HEAD FINDINGS Brain: No acute infarct, hemorrhage, or mass lesion is present. Mild generalized atrophy and moderate diffuse white matter disease demonstrates some progression since the prior exam. The ventricles are of normal size. No significant extraaxial fluid collection is present. The brainstem and cerebellum are within normal limits. Midline structures are within normal limits. Vascular: Atherosclerotic calcifications are present within the cavernous internal carotid arteries bilaterally. No hyperdense vessel is present. Skull: Calvarium is intact. No focal lytic or blastic lesions are present. No significant  extracranial soft tissue lesion is present. Sinuses/Orbits: The paranasal sinuses and mastoid air cells are clear. The globes and orbits are within normal limits. Other: Review of the MIP images confirms the above findings CTA NECK FINDINGS Aortic arch: A 3 vessel arch configuration is present. Atherosclerotic calcifications are present in the distal arch and at the origin of the left common carotid artery. No significant stenosis is present. No aneurysm or dissection is present. Right carotid system: The right common carotid artery is within normal limits. Atherosclerotic calcifications are present at the right common carotid artery and proximal right ICA without significant stenosis. The more distal cervical right ICA is normal. Left carotid system: The left common carotid artery is otherwise within normal limits. Atherosclerotic calcifications are present at the left carotid bifurcation. No significant stenosis is present. The cervical left ICA is otherwise normal. Vertebral arteries: The right vertebral artery is dominant. Both vertebral arteries originate from the subclavian arteries without significant stenosis. No significant stenosis is present in either vertebral artery in the neck. Skeleton: Solid fusion is present at C3-4 and C4-5. Facet hypertrophy contributes to foraminal stenosis bilaterally at C3-4. Uncovertebral spurring is present at C5-6 and C6-7. Other neck: The soft tissues of the neck are otherwise unremarkable. Salivary glands are within normal limits. Thyroid is normal. No significant adenopathy is present. No focal mucosal or  submucosal lesions are present. Upper chest: The lung apices are clear. The thoracic inlet is within normal limits. Review of the MIP images confirms the above findings CTA HEAD FINDINGS Anterior circulation: Atherosclerotic calcifications are present within the cavernous internal carotid arteries without significant stenosis through the ICA termini. The A1 and M1  segments are normal. The anterior communicating artery is patent. The ACA and MCA branch vessels are within normal limits. No aneurysm is present. Posterior circulation: The right vertebral artery is the dominant vessel. The left vertebral artery is centrally terminates at the PICA. Basilar artery is within normal limits. Superior cerebellar arteries are patent bilaterally. Both posterior cerebral arteries originate from the basilar tip. A high-grade stenosis is present in the right P2 segment. Distal PCA branch vessels are attenuated. Left PCA branch vessels are normal. Venous sinuses: The dural sinuses are patent. The straight sinus and deep cerebral veins are intact. Cortical veins are within normal limits. No significant vascular malformation is evident. Anatomic variants: None Review of the MIP images confirms the above findings IMPRESSION: 1. High-grade stenosis of the right P2 segment with attenuation of distal PCA branch vessels. 2. No other significant proximal stenosis, aneurysm, or branch vessel occlusion within the Circle of Willis. 3. Atherosclerotic changes at the carotid bifurcations bilaterally and cavernous internal carotid arteries bilaterally without significant stenosis. 4. Solid fusion at C3-4 and C4-5. 5. Facet hypertrophy contributes to foraminal stenosis bilaterally at C3-4. Electronically Signed   By: Marin Roberts M.D.   On: 11/03/2023 13:26   Vitals:   11/04/23 0500 11/04/23 0600 11/04/23 0705 11/04/23 1057  BP: 128/80 127/69 110/70 120/86  Pulse:   77 63  Resp: 12 13 17 17   Temp:   (!) 97.3 F (36.3 C) 98 F (36.7 C)  TempSrc:   Oral Oral  SpO2: 99% 97% 100% 100%   PHYSICAL EXAM General:  Alert, well-nourished, well-developed African American male in no acute distress Psych:  Mood and affect appropriate for situation, patient is calm and cooperative with exam CV: Regular rate and rhythm on monitor Respiratory:  Regular, unlabored respirations on room air GI:  Abdomen soft and nontender  NEURO:  Mental Status: AA&Ox3, patient is able to give clear and coherent history Speech/Language: speech is without dysarthria or aphasia.  Naming, repetition, fluency, and comprehension intact.  Cranial Nerves:  II: PERRL. Visual fields full.  III, IV, VI: EOMI. Eyelids elevate symmetrically.  V: Sensation is intact to light touch and symmetrical to face.  VII: Face is symmetrical resting and smiling VIII: Hearing is intact to voice. IX, X: Palate elevates symmetrically. Phonation is normal.  ZO:[id number] shrug 5/5. XII: Tongue protrudes midline Motor: Confrontational strength exam somewhat limited due to chronic pain.  Patient is unable to elevate the left upper extremity fully due to rotator cuff issues/pain limited assessment.  5/5 biceps, triceps, wrists.  Decreased fine motor movements in the left hand likely related to physical arthritic changes of the fingers.  RLE strength assessment is also pain limited with chronic right hip pain.  Within this limitation, he is able to elevate bilateral lower extremities antigravity without vertical drift. Right upper extremity 5/5 strength throughout.  Tone: is normal and bulk is normal Sensation: Intact and symmetric to light touch bilaterally. Extinction absent to light touch to DSS.   Coordination: FTN intact bilaterally, HKS: no ataxia in BLE.No drift.  Gait: deferred  Most Recent NIH 0   ASSESSMENT/PLAN  Edward [name] is a 73 y.o. male hx of HTN,  HLD, prostate cancer, CAD s/p MI, sciatica pain, who presents to [facility] via EMS for evaluation of episode of sudden onset left arm and leg weakness with spontaneous resolution.   NIH on Admission: 1  Acute Ischemic Infarct:  right posterior occipital 10 mm acute infarct likely clinically silent.  Symptoms do not correlate with location of infarct on MRI brain.  Likely patient had embolic event with TIA in addition to silent acute infarct shown on MRI brain. Etiology:  Likely embolic Code Stroke CT head No acute abnormality.  CTA head & neck  High-grade stenosis of the right P2 segment with attenuation of distal PCA branch vessels No other significant proximal stenosis, aneurysm, or branch vessel occlusion MRI   10 mm acute infarct in the posterior right occipital lobe.  Mild atrophy and white matter disease, likely chronic microvascular ischemia.  2D Echo: ejection fraction 60 to 65%.  Normal left atrial size Will need 30 day event monitor at discharge. Messaged cardiology APP for assistance with monitor placement.  LDL 68 HgbA1c 5.6 VTE prophylaxis - lovenox subcutaneous  aspirin 81 mg daily prior to admission, now on aspirin 81 mg daily and clopidogrel 75 mg daily for 3 weeks and then plavix alone. Therapy recommendations:  Outpatient PT, pending OT evaluation, no ST needs identified  Disposition:  PENDING  Hypertension Home meds:  amlodipine 10mg   Stable Blood Pressure Goal: BP less than 220/110  for 24 hours after symptom onset, then normotensive Avoid hypotension  Hyperlipidemia Home meds:  Lipitor 80mg , resumed in hospital LDL 68, goal < 70 Continue statin at discharge  Diabetes type II Controlled Home meds:  none HgbA1c 5.6, goal < 7.0 CBGs SSI  Tobacco Abuse Former cigarette smoker 20 pack-year tobacco smoking history, quit smoking 35 years ago   Other Stroke Risk Factors Coronary artery disease  Other Active Problems Sciatica Rotator cuff syndrome- left shoulder  Hospital day # 0  Lanae Boast, AGACNP-BC [facility] Pager: [phone]  I have personally obtained history,examined this patient, reviewed notes, independently viewed imaging studies, participated in medical decision making and plan of care.ROS completed by me personally and pertinent positives fully documented  I have made any additions or clarifications directly to the above note. Agree with note above.  Patient presented with transient  left-sided paresthesias and hemiparesis likely due to right hemispheric TIA and MRI shows silent right occipital infarct.  CT angiogram shows right P2 occlusion.  Patient likely had a posterior circulation embolic source which below close and went distally.  Recommend aspirin and Plavix for 3 weeks followed by Plavix alone and aggressive risk factor modification.  Patient will need outpatient 30-day heart monitor for paroxysmal A-fib after discharge.  Greater than 50% time during this 50-minute visit was spent in counseling and coordination of care and discussion with patient and care team.  Follow-up as an outpatient to stroke clinic in 2 months.  Can you call for questions.  Stroke team will sign off. Delia Heady, MD Medical Director [facility] Pager: [phone] 11/04/2023 3:13 PM  To contact Stroke Continuity provider, please refer to [email or web address]. After hours, contact General Neurology

## 2023-11-04 NOTE — Assessment & Plan Note (Addendum)
 BP acceptable.  Home medications include amlodipine 10 mg daily. -Add back home amlodipine as indicated

## 2023-11-04 NOTE — Assessment & Plan Note (Signed)
 IDA: Continue ferrous sulfate 325 daily CAD: Aspirin as above, continue home PRN nitroglycerin 0.4 mg per chest pain HLD: Continue atorvastatin 80 mg daily Home supplements: Continue vitamin D, B12, multivitamin, magnesium

## 2023-11-04 NOTE — Discharge Summary (Signed)
 Family Medicine Teaching [facility] Discharge Summary  Patient name: Edward [name] Medical record number: [id number] Date of birth: 1950/09/01 Age: 73 y.o. Gender: male Date of Admission: 11/03/2023  Date of Discharge: 11/04/2023 Admitting Physician: Tomie China, MD  Primary Care Provider: Caffie Damme, MD Consultants: Neurology  Indication for Hospitalization: Stroke  Discharge Diagnoses/Problem List:  Principal Problem for Admission: Stroke Other Problems addressed during stay:  Principal Problem:   Stroke [facility]) in right occipital lobe Active Problems:   Chronic health problem   AKI (acute kidney injury) (HCC)   Hypertension   On deep vein thrombosis (DVT) prophylaxis    Brief Hospital Course:  Edward [name] is a 73 y.o.male with a history of HTN, prostate cancer in remission, CAD c/b MI (cath 1999), HLD, C-spine injury (2013, C4/C5), left rotator cuff arthritis, sciatica  who was admitted to the [facility] Medicine Teaching Service at [facility] for sudden onset left upper and lower limb weakness. His hospital course is detailed below:  Stroke Presented with transient left-sided upper and lower extremity weakness which resolved after 30 minutes.  CT did not reflect any acute changes however MRI brain revealed 10 mm acute infarct in posterior right occipital lobe.  Neurology initiated Plavix 75 mg daily and continued aspirin and atorvastatin.  Risk stratification labs were not outstanding.  Echocardiogram showed low risk for embolic cause of stroke.  Neurology recommended 21 days of aspirin and Plavix followed by Plavix indefinitely.  Patient was discharged with instructions for 30-day cardiac monitor to received in the mail and follow-up with cardiology as well as neurology as outpatient stroke team.  AKI Presented with creatinine 1.53 with baseline approximately 1.  He received IV fluids and the next morning creatinine back to baseline.  Other chronic conditions were  medically managed with home medications and formulary alternatives as necessary (iron deficiency anemia, CAD, HLD)  PCP Follow-up Recommendations: Recommended outpatient PT referral for strength, balance, and functional mobility 3 times per week. Ensure cardiology follow-up, stroke clinic follow-up.  Disposition: home with outpatient PT  Discharge Condition: stable  Discharge Exam:  Vitals:   11/04/23 1357 11/04/23 1459  BP: 117/85 138/81  Pulse: 80 62  Resp: 18 17  Temp:  98.2 F (36.8 C)  SpO2: 100% 96%  Exam per Dr. Royal Piedra AM 11/04/23 General: Well-appearing, NAD Cardiovascular: RRR, murmurs auscultated Respiratory: CTAB Neuro: A&O x 4, CN II through XII intact, strength normal and sensation intact of bilateral upper and lower extremities, no focal neurological deficits appreciable  Significant Procedures: None  Significant Labs and Imaging:  Recent Labs  Lab 11/03/23 1138 11/03/23 1216 11/04/23 0452  WBC 5.2  --  4.5  HGB 12.7* 12.2* 14.0  HCT 36.7* 36.0* 40.2  PLT 229  --  251   Recent Labs  Lab 11/03/23 1138 11/03/23 1216 11/04/23 0452  NA 137 139 140  K 3.7 3.8 3.5  CL 103 104 106  CO2 26  --  24  GLUCOSE 101* 97 101*  BUN 16 17 17   CREATININE 1.40* 1.50* 0.98  CALCIUM 8.9  --  9.1  ALKPHOS 58  --   --   AST 21  --   --   ALT 21  --   --   ALBUMIN 3.6  --   --     MRI Brain 11/03/23 IMPRESSION: 1. 10 mm acute infarct in the posterior right occipital lobe. 2. Mild atrophy and white matter disease. This likely reflects the sequela of chronic microvascular ischemia.  3. Fusion of the cervical spine at C3-4.  ECHO 11/04/23  1. Left ventricular ejection fraction, by estimation, is 60 to 65%. The  left ventricle has normal function. The left ventricle has no regional  wall motion abnormalities. Left ventricular diastolic parameters are  consistent with Grade I diastolic  dysfunction (impaired relaxation).   2. Right ventricular systolic function  is normal. The right ventricular  size is normal. There is normal pulmonary artery systolic pressure.   3. The mitral valve is normal in structure. No evidence of mitral valve  regurgitation. No evidence of mitral stenosis.   4. The aortic valve is tricuspid. There is mild calcification of the  aortic valve. There is mild thickening of the aortic valve. Aortic valve  regurgitation is not visualized. Aortic valve sclerosis is present, with  no evidence of aortic valve stenosis.   5. The inferior vena cava is normal in size with greater than 50%  respiratory variability, suggesting right atrial pressure of 3 mmHg.   Results/Tests Pending at Time of Discharge: None  Discharge Medications:  Allergies as of 11/04/2023   No Known Allergies      Medication List     PAUSE taking these medications    amLODipine 10 MG tablet Wait to take this until your doctor or other care provider tells you to start again. Commonly known as: NORVASC Take 1 tablet (10 mg total) by mouth daily.       STOP taking these medications    Fish Oil 1200 MG Caps   meloxicam 15 MG tablet Commonly known as: MOBIC   Potassium 99 MG Tabs       TAKE these medications    aspirin 81 MG chewable tablet Chew 1 tablet (81 mg total) by mouth daily in the afternoon for 19 days.   atorvastatin 80 MG tablet Commonly known as: LIPITOR Take 80 mg by mouth daily.   clopidogrel 75 MG tablet Commonly known as: PLAVIX Take 1 tablet (75 mg total) by mouth daily. Start taking on: November 05, 2023   cyanocobalamin 1000 MCG tablet Commonly known as: VITAMIN B12 Take 1,000 mcg by mouth daily.   docusate sodium 100 MG capsule Commonly known as: COLACE Take 100 mg by mouth daily.   ferrous sulfate 325 (65 FE) MG tablet Take 325 mg by mouth daily with breakfast.   Magnesium 250 MG Tabs Take 250 mg by mouth daily.   multivitamin tablet Take 1 tablet by mouth daily. 50+   nitroGLYCERIN 0.4 MG SL  tablet Commonly known as: NITROSTAT Dissolve 1 tablet under the tongue every 5 minutes as needed for chest pain. Max of 3 doses, then 911.   Vitamin D 50 MCG (2000 UT) Caps Take 2,000 Units by mouth daily.        Discharge Instructions: Please refer to Patient Instructions section of EMR for full details.  Patient was counseled important signs and symptoms that should prompt return to medical care, changes in medications, dietary instructions, activity restrictions, and follow up appointments.   Follow-Up Appointments:  Follow-up Information     Caffie Damme, MD. Schedule an appointment as soon as possible for a visit in 1 week(s).   Specialty: Family Medicine Why: Make appointment to see how you are doing with your regular doctor. Contact information: [address] [place] [address] [phone]         Tereso Newcomer, PA-C Follow up.   Why: CARDIOLOGY FOLLOW-UP SCHEDULED:  Monday Dec 31, 2023 10:55 AM (Arrive  by 10:40 AM)  PLEASE NOTE THIS IS AT OUR NEW LOCATION ON [address]. DISREGARD THE LISTING FOR [address] BELOW.  You have a follow-up appointment with a provider at [facility] in [place].  We are currently in the process of transitioning from two locations to one.  Effective November 26, 2023, all appointments that were previously scheduled at either our [facility] or [name] locations will be moved to our new location at [address] [place], [place], [address].  The phone number for our new location will be [phone].  Dr. Harvie Bridge office will also send you a monitor to wear for 30 days. It will come with instructions for how to apply and mail back. Contact information: [facility] **NOTE NEW ADDRESS** [phone]        Micki Riley, MD. Schedule an appointment as soon as possible for a visit in 2 month(s).   Specialties: Neurology, Radiology Why: Stroke Clinic f/u Contact information: [address] [place]  [phone]                 Celine Mans, MD 11/04/2023, 4:11 PM PGY-2, [facility]

## 2023-11-04 NOTE — Discharge Instructions (Addendum)
 Dear [name],  Thank you for letting us participate in your care. You were hospitalized for a Stroke [facility]). You were observed and started on aspirin and plavix to help prevent further strokes.  POST-HOSPITAL & CARE INSTRUCTIONS You will take Aspirin for 19 more days then stop. Please continue taking Plavix until your follow-up with neurology.  A cardiac monitor will be mailed to your house, instruction will be inside on how to place. You will then see your cardiologist at the appointment below to discuss the results. Go to your follow up appointments (listed below)   DOCTOR'S APPOINTMENT   Future Appointments  Date Time Provider Department Center  12/31/2023 10:55 AM Beatrice Lecher, PA-C [facility]    Follow-up Information     Caffie Damme, MD. Schedule an appointment as soon as possible for a visit in 1 week(s).   Specialty: Family Medicine Why: Make appointment to see how you are doing with your regular doctor. Contact information: [address] [place] [address] [phone]                 Take care and be well!  Family Medicine Teaching Service Inpatient Team [place]  [facility]  [address] [place], [place] [address] [phone]

## 2023-11-04 NOTE — Telephone Encounter (Signed)
 Per neurology/internal medicine request - needs 30 day monitor for stroke. Patient of Dr. Elease Hashimoto. Primary team notified patient that this will be mailed to his house. I already arranged f/u with APP.

## 2023-11-04 NOTE — Assessment & Plan Note (Addendum)
 MRI brain showed 10 mm acute infarct in the posterior right occipital lobe.  Versus CVA.  CT angiogram negative for acute process though with high-grade stenosis of P2.  No evidence of carotid stenosis though with atherosclerosis. Neuro exam intact on exam, no residual deficits, passed bedside swallow screen.  -Neurology following, appreciate recommendations -Follow-up echocardiogram -Continue Aspirin 81 milligrams daily, Plavix 75 mg daily -PT/OT/SLP eval and treat -Vital signs -Neurochecks every 4 hours

## 2023-11-04 NOTE — Assessment & Plan Note (Addendum)
 Creatinine 0.98.  AKI resolved.  Discontinue IV fluids.

## 2023-11-04 NOTE — Evaluation (Signed)
 Physical Therapy Evaluation Patient Details Name: Edward [name] MRN: [id number] DOB: 01/29/1951 Today's Date: 11/04/2023  History of Present Illness  Pt is a 73 yo male admitted to [facility] on 11/03/23 for L sided weakness to which he reports has resolved. Pt receiving further evaluation for possible TIA. PMH of sciatica, rotator cuff syndrome, HTN, CAD.  Clinical Impression  Pt is presenting close to baseline level of function. Pt intermittently uses a SPC at home. RW available; pt advised to use RW initially at home until he feels more stable on his feet. Currently pt is Mod I with bed mobility, sit to stand without an AD and gait with RW. Pt states understanding on safety education. Due to pt current functional status, home set up and available assistance at home recommending skilled physical therapy services 3x/week in order to address strength, balance and functional mobility to decrease risk for falls, injury and re-hospitalization. Pt will be discharged at this time from acute care skilled physical therapy services; please re-consult if further needs arise.           If plan is discharge home, recommend the following: Other (comment) (as needed assistance)     Equipment Recommendations None recommended by PT     Functional Status Assessment Patient has not had a recent decline in their functional status     Precautions / Restrictions Precautions Precautions: Fall Restrictions Weight Bearing Restrictions Per Provider Order: No      Mobility  Bed Mobility Overal bed mobility: Modified Independent      Transfers Overall transfer level: Modified independent Equipment used: None       General transfer comment: slight instability. Uses stretcher to stable himself.    Ambulation/Gait Ambulation/Gait assistance: Modified independent (Device/Increase time) Gait Distance (Feet): 400 Feet Assistive device: Rolling walker (2 wheels) Gait Pattern/deviations: Step-through pattern,  WFL(Within Functional Limits) Gait velocity: Slightly decreased Gait velocity interpretation: >2.62 ft/sec, indicative of community ambulatory   General Gait Details: no significant deviations  Stairs Stairs:  (Pt demonstrates through current sit to stand and gait that he is able to navigate stairs per home set up with use of rails at mod I.)             Balance Overall balance assessment: Modified Independent       Pertinent Vitals/Pain Pain Assessment Pain Assessment: No/denies pain    Home Living Family/patient expects to be discharged to:: Private residence Living Arrangements: Spouse/significant other;Other relatives (son, grandchildren, daughter) Available Help at Discharge: Available 24 hours/day Type of Home: House Home Access: Stairs to enter Entrance Stairs-Rails: Left;Right;Can reach both Entrance Stairs-Number of Steps: 6 Alternate Level Stairs-Number of Steps: 20 Home Layout: Two level;Able to live on main level with bedroom/bathroom Home Equipment: Cane - single point;Rolling Walker (2 wheels);Rollator (4 wheels);Shower seat - built in      Prior Function Prior Level of Function : Driving             Mobility Comments: Pt reports 1x fall with sciatic nerve pain. Pt reports pain/weakness in the RLE due to back pain due to a ruptured disc in low back that has previously been corrected but continues to bother him. Pt reports occasionally the RLE givers out on him. pt states he usually ambulates without cane but will normally carry it with him just in case. ADLs Comments: Pt reports independence with ADL"s and IADL's.     Extremity/Trunk Assessment   Upper Extremity Assessment Upper Extremity Assessment: Overall WFL for tasks assessed;RUE deficits/detail  RUE Deficits / Details: 4-/5 throughout RUE, 1/5 r shoulder flexion due to failed RC surgery was scheduled for reverse total shoulder this week which was canceled due to recent hospitalization    Lower  Extremity Assessment Lower Extremity Assessment: Overall WFL for tasks assessed;RLE deficits/detail RLE Deficits / Details: Pt has ~10 degree loss in R knee due to previous knee surgery and weakness. 5/5 strength throughout bil LE    Cervical / Trunk Assessment Cervical / Trunk Assessment: Normal  Communication   Communication Communication: No apparent difficulties    Cognition Arousal: Alert Behavior During Therapy: WFL for tasks assessed/performed   PT - Cognitive impairments: No apparent impairments     Following commands: Intact       Cueing Cueing Techniques: Verbal cues     General Comments General comments (skin integrity, edema, etc.): No noted skin issues. Pt demonstrates no signs/symptoms of cardiac/respiratory distress throughout session. Pt demonstrates good coordination with accuracy and slight decrease in speed bil        Assessment/Plan    PT Assessment All further PT needs can be met in the next venue of care  PT Problem List Decreased mobility;Decreased balance;Decreased range of motion       PT Treatment Interventions      PT Goals (Current goals can be found in the Care Plan section)  Acute Rehab PT Goals PT Goal Formulation: All assessment and education complete, DC therapy     AM-PAC PT "6 Clicks" Mobility  Outcome Measure Help needed turning from your back to your side while in a flat bed without using bedrails?: None Help needed moving from lying on your back to sitting on the side of a flat bed without using bedrails?: None Help needed moving to and from a bed to a chair (including a wheelchair)?: None Help needed standing up from a chair using your arms (e.g., wheelchair or bedside chair)?: None Help needed to walk in hospital room?: None Help needed climbing 3-5 steps with a railing? : None 6 Click Score: 24    End of Session Equipment Utilized During Treatment: Gait belt Activity Tolerance: Patient tolerated treatment well Patient  left: in bed;with call bell/phone within reach;with family/visitor present Nurse Communication: Mobility status PT Visit Diagnosis: Unsteadiness on feet (R26.81)    Time: 0454-0981 PT Time Calculation (min) (ACUTE ONLY): 32 min   Charges:   PT Evaluation $PT Eval Low Complexity: 1 Low PT Treatments $Therapeutic Activity: 8-22 mins PT General Charges $$ ACUTE PT VISIT: 1 Visit         Harrel Carina, DPT, CLT  Acute Rehabilitation Services Office: [phone] (Secure chat preferred)   Claudia Desanctis 11/04/2023, 10:58 AM

## 2023-11-08 ENCOUNTER — Ambulatory Visit (HOSPITAL_COMMUNITY): Admission: RE | Admit: 2023-11-08 | Source: Home / Self Care | Admitting: Orthopedic Surgery

## 2023-11-08 ENCOUNTER — Encounter (HOSPITAL_COMMUNITY): Admission: RE | Payer: Self-pay | Source: Home / Self Care

## 2023-11-08 ENCOUNTER — Telehealth: Payer: Self-pay | Admitting: Cardiovascular Disease

## 2023-11-08 SURGERY — ARTHROPLASTY, SHOULDER, TOTAL, REVERSE
Anesthesia: Choice | Site: Shoulder | Laterality: Left

## 2023-11-08 NOTE — Telephone Encounter (Signed)
 Patient is requesting to speak with a nurse in regard to a heart monitor. Please advise.

## 2023-11-08 NOTE — Telephone Encounter (Signed)
 Patient had misinterpreted self pay option paragraph on monitor instructions as test not being covered by his insurance.  Patient given [employer] number to call to receive estimated quote on his out of pocket cost. He has not received his monitor yet, but will call us if he needs help with the application.

## 2023-12-11 ENCOUNTER — Ambulatory Visit: Attending: Cardiovascular Disease

## 2023-12-11 DIAGNOSIS — I639 Cerebral infarction, unspecified: Secondary | ICD-10-CM

## 2023-12-14 ENCOUNTER — Ambulatory Visit: Payer: Self-pay | Admitting: Cardiovascular Disease

## 2023-12-14 DIAGNOSIS — I639 Cerebral infarction, unspecified: Secondary | ICD-10-CM | POA: Diagnosis not present

## 2023-12-30 DIAGNOSIS — E7800 Pure hypercholesterolemia, unspecified: Secondary | ICD-10-CM | POA: Insufficient documentation

## 2023-12-30 NOTE — Progress Notes (Unsigned)
 OFFICE NOTE Date:  12/31/2023  ID:  Edward [name], DOB [birth year], MRN [id number] PCP: Bertrum Brodie, MD  [facility] Providers Cardiologist:  Ahmad Alert, MD     Patient Profile:     Coronary artery disease  Inferior MI in 05/1998 s/p BMS to RCA Hx of CVA  TTE 11/04/23: EF 60-65, no RWMA, Gr 1 DD, NL RVSF, NL PASP, AV sclerosis Monitor 11/2023: rare PVCs, no significant arrhythmias, no AFib   Hypertension  Hyperlipidemia  Prostate CA        Discussed the use of AI scribe software for clinical note transcription with the patient, who gave verbal consent to proceed. History of Present Illness Edward ESQUIVIAS is a 73 y.o. male  He was last seen by Dr. Alroy Aspen in 12/2022. He was admitted 4/5-4/6 with R brain stroke (MRI w post R occipital lobe CVA). Neuro recommended Clopidogrel  and ASA x 3 weeks, then Clopidogrel  indefinitely. TTE showed normal EF. OP monitor was obtained and was neg for AFib.   He is here with his wife.  He notes left-sided weakness in his hand and leg that prompted his presentation to the hospital.  This resolved after about 45 minutes.  He has not had any recurrent symptoms and does not have any residual deficits.  He has not had chest pain, pressure, heaviness, shortness of breath, or syncope.   ROS-See HPI    Studies Reviewed:       Results LABS LDL: 68 mg/dL (40/98/1191)  Risk Assessment/Calculations:          Physical Exam:  VS:  BP 126/78   Pulse 78   Ht 6' (1.829 m)   Wt 204 lb 6.4 oz (92.7 kg)   SpO2 97%   BMI 27.72 kg/m    Wt Readings from Last 3 Encounters:  12/31/23 204 lb 6.4 oz (92.7 kg)  11/02/23 203 lb (92.1 kg)  04/27/23 195 lb (88.5 kg)    Constitutional:      Appearance: Healthy appearance. Not in distress.  Neck:     Vascular: JVD normal.  Pulmonary:     Breath sounds: Normal breath sounds. No wheezing. No rales.  Cardiovascular:     Normal rate. Regular rhythm.     Murmurs: There is no murmur.  Edema:     Peripheral edema absent.  Abdominal:     Palpations: Abdomen is soft.        Assessment and Plan: Assessment & Plan Coronary artery disease involving native coronary artery of native heart without angina pectoris History of myocardial infarction treated with a bare metal stent in 1999. Currently asymptomatic with no angina.  - Continue Plavix  75 mg daily. - Continue amlodipine  10 mg daily. - Continue Lipitor 80 mg daily. - Continue nitroglycerin  as needed. - Follow-up 6 months History of stroke MRI revealed a 10 mm acute infarct in the right posterior occipital lobe, likely clinically silent.  Notes from neurology were reviewed.  It was noted that his symptoms symptoms did not correlate with the infarct location, suggesting an embolic event with transient ischemic attack (TIA) in addition to the silent acute infarct. Echocardiogram showed normal ejection fraction (60-65%) with grade one diastolic dysfunction and no embolic source. Outpatient monitoring showed no atrial fibrillation, only rare premature ventricular contractions (PVCs) and no significant arrhythmias. Neurology recommended Plavix  and aspirin  for three weeks, then Plavix  indefinitely. He has not experienced further symptoms since discharge. - Refill Plavix  75 mg daily. - Discontinue aspirin . - Discuss long-term  antiplatelet therapy with neurology at the upcoming appointment. Primary hypertension Blood pressure is well-controlled with readings between 120s/70s mmHg. - Continue amlodipine  10 mg daily. Pure hypercholesterolemia Hyperlipidemia with a goal LDL of less than 55. Recent LDL was 68. Lipitor dosage was increased during hospitalization. Lipids will be rechecked at the end of the month with primary care. - Continue Lipitor 80 mg daily.         Dispo:  Return in about 6 months (around 07/01/2024) for Routine Follow Up, w/ Marlyse Single, PA-C. Signed, Marlyse Single, PA-C

## 2023-12-31 ENCOUNTER — Encounter: Payer: Self-pay | Admitting: Physician Assistant

## 2023-12-31 ENCOUNTER — Ambulatory Visit: Attending: Physician Assistant | Admitting: Physician Assistant

## 2023-12-31 VITALS — BP 126/78 | HR 78 | Ht 72.0 in | Wt 204.4 lb

## 2023-12-31 DIAGNOSIS — Z8673 Personal history of transient ischemic attack (TIA), and cerebral infarction without residual deficits: Secondary | ICD-10-CM

## 2023-12-31 DIAGNOSIS — I2510 Atherosclerotic heart disease of native coronary artery without angina pectoris: Secondary | ICD-10-CM | POA: Diagnosis not present

## 2023-12-31 DIAGNOSIS — I10 Essential (primary) hypertension: Secondary | ICD-10-CM | POA: Diagnosis not present

## 2023-12-31 DIAGNOSIS — E7800 Pure hypercholesterolemia, unspecified: Secondary | ICD-10-CM | POA: Diagnosis not present

## 2023-12-31 MED ORDER — CLOPIDOGREL BISULFATE 75 MG PO TABS: 75.0000 mg | ORAL_TABLET | Freq: Every day | ORAL | 1 refills | Status: DC

## 2023-12-31 NOTE — Assessment & Plan Note (Signed)
 History of myocardial infarction treated with a bare metal stent in 1999. Currently asymptomatic with no angina.  - Continue Plavix  75 mg daily. - Continue amlodipine  10 mg daily. - Continue Lipitor 80 mg daily. - Continue nitroglycerin  as needed. - Follow-up 6 months

## 2023-12-31 NOTE — Assessment & Plan Note (Signed)
 Hyperlipidemia with a goal LDL of less than 55. Recent LDL was 68. Lipitor dosage was increased during hospitalization. Lipids will be rechecked at the end of the month with primary care. - Continue Lipitor 80 mg daily.

## 2023-12-31 NOTE — Assessment & Plan Note (Signed)
 Blood pressure is well-controlled with readings between 120s/70s mmHg. - Continue amlodipine  10 mg daily.

## 2023-12-31 NOTE — Patient Instructions (Signed)
 Medication Instructions:  Until you see the neurologist (Dr. Janett Medin): Stop Aspirin  Start back on Clopidogrel  75 mg once daily  You can discuss with the neurologist whether or not to continue this regimen.  *If you need a refill on your cardiac medications before your next appointment, please call your pharmacy*   Follow-Up: At [facility], you and your health needs are our priority.  As part of our continuing mission to provide you with exceptional heart care, our providers are all part of one team.  This team includes your primary Cardiologist (physician) and Advanced Practice Providers or APPs (Physician Assistants and Nurse Practitioners) who all work together to provide you with the care you need, when you need it.  Your next appointment:   6 month(s)  Provider:   Marlyse Single, PA-C          We recommend signing up for the patient portal called "MyChart".  Sign up information is provided on this After Visit Summary.  MyChart is used to connect with patients for Virtual Visits (Telemedicine).  Patients are able to view lab/test results, encounter notes, upcoming appointments, etc.  Non-urgent messages can be sent to your provider as well.   To learn more about what you can do with MyChart, go to [email or web address].   Other Instructions

## 2024-01-02 ENCOUNTER — Encounter: Payer: Self-pay | Admitting: Neurology

## 2024-01-22 ENCOUNTER — Ambulatory Visit: Admitting: Neurology

## 2024-01-22 ENCOUNTER — Encounter: Payer: Self-pay | Admitting: Neurology

## 2024-01-22 VITALS — BP 140/83 | HR 83 | Ht 72.0 in | Wt 203.0 lb

## 2024-01-22 DIAGNOSIS — I639 Cerebral infarction, unspecified: Secondary | ICD-10-CM | POA: Diagnosis not present

## 2024-01-22 DIAGNOSIS — G459 Transient cerebral ischemic attack, unspecified: Secondary | ICD-10-CM | POA: Diagnosis not present

## 2024-01-22 DIAGNOSIS — E7849 Other hyperlipidemia: Secondary | ICD-10-CM | POA: Diagnosis not present

## 2024-01-22 NOTE — Patient Instructions (Addendum)
 I had a long d/w patient about his recent  silent stroke, TIA,risk for recurrent stroke/TIAs, personally independently reviewed imaging studies and stroke evaluation results and answered questions.Continue Plavix  75 mg daily  for secondary stroke prevention and maintain strict control of hypertension with blood pressure goal below 130/90, diabetes with hemoglobin A1c goal below 6.5% and lipids with LDL cholesterol goal below 70 mg/dL. I also advised the patient to eat a healthy diet with plenty of whole grains, cereals, fruits and vegetables, exercise regularly and maintain ideal body weight Followup in the future with my nurse practitioner in 6 months or call earlier if needed.  Stroke Prevention Some medical conditions and behaviors can lead to a higher chance of having a stroke. You can help prevent a stroke by eating healthy, exercising, not smoking, and managing any medical conditions you have. Stroke is a leading cause of functional impairment. Primary prevention is particularly important because a majority of strokes are first-time events. Stroke changes the lives of not only those who experience a stroke but also their family and other caregivers. How can this condition affect me? A stroke is a medical emergency and should be treated right away. A stroke can lead to brain damage and can sometimes be life-threatening. If a person gets medical treatment right away, there is a better chance of surviving and recovering from a stroke. What can increase my risk? The following medical conditions may increase your risk of a stroke: Cardiovascular disease. High blood pressure (hypertension). Diabetes. High cholesterol. Sickle cell disease. Blood clotting disorders (hypercoagulable state). Obesity. Sleep disorders (obstructive sleep apnea). Other risk factors include: Being older than age 30. Having a history of blood clots, stroke, or mini-stroke (transient ischemic attack, TIA). Genetic factors,  such as race, ethnicity, or a family history of stroke. Smoking cigarettes or using other tobacco products. Taking birth control pills, especially if you also use tobacco. Heavy use of alcohol or drugs, especially cocaine and methamphetamine. Physical inactivity. What actions can I take to prevent this? Manage your health conditions High cholesterol levels. Eating a healthy diet is important for preventing high cholesterol. If cholesterol cannot be managed through diet alone, you may need to take medicines. Take any prescribed medicines to control your cholesterol as told by your health care provider. Hypertension. To reduce your risk of stroke, try to keep your blood pressure below 130/80. Eating a healthy diet and exercising regularly are important for controlling blood pressure. If these steps are not enough to manage your blood pressure, you may need to take medicines. Take any prescribed medicines to control hypertension as told by your health care provider. Ask your health care provider if you should monitor your blood pressure at home. Have your blood pressure checked every year, even if your blood pressure is normal. Blood pressure increases with age and some medical conditions. Diabetes. Eating a healthy diet and exercising regularly are important parts of managing your blood sugar (glucose). If your blood sugar cannot be managed through diet and exercise, you may need to take medicines. Take any prescribed medicines to control your diabetes as told by your health care provider. Get evaluated for obstructive sleep apnea. Talk to your health care provider about getting a sleep evaluation if you snore a lot or have excessive sleepiness. Make sure that any other medical conditions you have, such as atrial fibrillation or atherosclerosis, are managed. Nutrition Follow instructions from your health care provider about what to eat or drink to help manage your health condition. These  instructions may include: Reducing your daily calorie intake. Limiting how much salt (sodium) you use to 1,500 milligrams (mg) each day. Using only healthy fats for cooking, such as olive oil, canola oil, or sunflower oil. Eating healthy foods. You can do this by: Choosing foods that are high in fiber, such as whole grains, and fresh fruits and vegetables. Eating at least 5 servings of fruits and vegetables a day. Try to fill one-half of your plate with fruits and vegetables at each meal. Choosing lean protein foods, such as lean cuts of meat, poultry without skin, fish, tofu, beans, and nuts. Eating low-fat dairy products. Avoiding foods that are high in sodium. This can help lower blood pressure. Avoiding foods that have saturated fat, trans fat, and cholesterol. This can help prevent high cholesterol. Avoiding processed and prepared foods. Counting your daily carbohydrate intake.  Lifestyle If you drink alcohol: Limit how much you have to: 0-1 drink a day for women who are not pregnant. 0-2 drinks a day for men. Know how much alcohol is in your drink. In the [place] one drink equals one 12 oz bottle of beer ( ), one 5 oz glass of wine ( ), or one 1 oz glass of hard liquor (44mL). Do not use any products that contain nicotine or tobacco. These products include cigarettes, chewing tobacco, and vaping devices, such as e-cigarettes. If you need help quitting, ask your health care provider. Avoid secondhand smoke. Do not use drugs. Activity  Try to stay at a healthy weight. Get at least 30 minutes of exercise on most days, such as: Fast walking. Biking. Swimming. Medicines Take over-the-counter and prescription medicines only as told by your health care provider. Aspirin  or blood thinners (antiplatelets or anticoagulants) may be recommended to reduce your risk of forming blood clots that can lead to stroke. Avoid taking birth control pills. Talk to your health care provider about  the risks of taking birth control pills if: You are over 46 years old. You smoke. You get very bad headaches. You have had a blood clot. Where to find more information American Stroke Association: www.strokeassociation.org Get help right away if: You or a loved one has any symptoms of a stroke. BE FAST is an easy way to remember the main warning signs of a stroke: B - Balance. Signs are dizziness, sudden trouble walking, or loss of balance. E - Eyes. Signs are trouble seeing or a sudden change in vision. F - Face. Signs are sudden weakness or numbness of the face, or the face or eyelid drooping on one side. A - Arms. Signs are weakness or numbness in an arm. This happens suddenly and usually on one side of the body. S - Speech. Signs are sudden trouble speaking, slurred speech, or trouble understanding what people say. T - Time. Time to call emergency services. Write down what time symptoms started. You or a loved one has other signs of a stroke, such as: A sudden, severe headache with no known cause. Nausea or vomiting. Seizure. These symptoms may represent a serious problem that is an emergency. Do not wait to see if the symptoms will go away. Get medical help right away. Call your local emergency services (911 in the [place] Do not drive yourself to the hospital. Summary You can help to prevent a stroke by eating healthy, exercising, not smoking, limiting alcohol intake, and managing any medical conditions you may have. Do not use any products that contain nicotine or tobacco. These include cigarettes, chewing tobacco, and  vaping devices, such as e-cigarettes. If you need help quitting, ask your health care provider. Remember BE FAST for warning signs of a stroke. Get help right away if you or a loved one has any of these signs. This information is not intended to replace advice given to you by your health care provider. Make sure you discuss any questions you have with your health care  provider. Document Revised: 06/19/2022 Document Reviewed: 06/19/2022 Elsevier Patient Education  2024 [employer].

## 2024-01-22 NOTE — Progress Notes (Signed)
 [facility] [address] [place]. [place] [address] [phone]       OFFICE FOLLOW-UP NOTE  [name] Date of Birth:  Oct 06, 1950 Medical Record Number:  [id number]   HPI: Mr. [name] is a 73 year old pleasant African-American male seen today for initial office follow-up visit following hospital consultation for stroke in April 2025.  History is obtained from the patient and review of electronic medical records and I personally reviewed pertinent available imaging films in PACS. He has past medical history of hypertension, hyperlipidemia, coronary artery disease, status post MI, sciatica prostate cancer.  Patient presented on 11/03/2023 for evaluation for sudden onset of left arm and leg weakness and unable to get up.  He also had a headache ongoing for the prior few days.  He denied any facial droop, slurred speech or vision changes.  CT head on admission showed no acute abnormalities and CT angiogram showed high-grade stenosis of the right P2 segment.  NIH stroke scale on admission was 1 only for left-sided sensory deficits.  MRI scan of the brain showed a 10 mm acute infarct involving posterior right occipital lobe which would not explain his symptoms.  His symptoms residual is resolved spontaneously while in the hospital.  Transthoracic echo showed ejection fraction of 60 to 65%.  Left atrial size was normal.  LDL cholesterol 68 mg percent.  Hemoglobin A1c was 5.6.  Telemetry monitoring during hospitalization did not show any paroxysmal A-fib.  Patient was discharged on aspirin  and Plavix  for 3 weeks followed by Plavix  alone.  He states he has done well since then.  His sensory symptoms are completely resolved.  He has had no recurrent stroke or TIA symptoms.  He remains on Plavix  which is tolerating well with minor bruising but no bleeding.  He states his blood pressure is under good control.  He is tolerating Lipitor well without muscle aches and pains.  He is retired.   He is independent in all activities of daily living.  He did undergo 30-day external heart monitor which showed no evidence of paroxysmal A-fib.  He does have a rotator cuff injury in the left shoulder as well as a sciatica and his back pain which seem to be his main complaints at this time. ROS:   14 system review of systems is positive for headache, numbness, bruising, sciatica and left shoulder pain and all other systems negative  PMH:  Past Medical History:  Diagnosis Date   Arthritis    Chicken pox    Coronary artery disease    a. 1999 s/p MI with cath/PCI;  b. 05/1999 Ex Cardiolite EF 68%, no ishcemia.   Dyslipidemia    Family history of breast cancer    Family history of oral cancer    History of tobacco abuse    Hypertension    Hypokalemia    Injury of cervical spine (HCC)    a. 02/2012 C4/5   Measles    Mumps    Myocardial infarction [facility])    Nasal bones, closed fracture    a. 02/2012 in setting of presyncope/fall   Near syncope    Pneumonia    pt denies   Prostate cancer (HCC)    Sciatica    Whooping cough     Social History:  Social History   Socioeconomic History   Marital status: Married    Spouse name: Not on file   Number of children: Not on file   Years of education: Not on file  Highest education level: Not on file  Occupational History   Occupation: Paramedic    Comment: Doctor, hospital  Tobacco Use   Smoking status: Former    Current packs/day: 0.00    Average packs/day: 1 pack/day for 20.0 years (20.0 ttl pk-yrs)    Types: Cigarettes    Start date: 11/01/1968    Quit date: 11/01/1988    Years since quitting: 35.2   Smokeless tobacco: Never  Vaping Use   Vaping status: Never Used  Substance and Sexual Activity   Alcohol use: Not Currently    Comment: occasional alcoholic beverage.   Drug use: No   Sexual activity: Yes  Other Topics Concern   Not on file  Social History Narrative   Married and lives with wife in [place].  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  Exercises 2-3 days/wk without limitations.   Social Drivers of Corporate investment banker Strain: Not on file  Food Insecurity: No Food Insecurity (11/04/2023)   Hunger Vital Sign    Worried About Running Out of Food in the Last Year: Never true    Ran Out of Food in the Last Year: Never true  Transportation Needs: No Transportation Needs (11/04/2023)   PRAPARE - Administrator, Civil Service (Medical): No    Lack of Transportation (Non-Medical): No  Physical Activity: Not on file  Stress: Not on file  Social Connections: Moderately Isolated (11/04/2023)   Social Connection and Isolation Panel    Frequency of Communication with Friends and Family: More than three times a week    Frequency of Social Gatherings with Friends and Family: More than three times a week    Attends Religious Services: Never    Database administrator or Organizations: No    Attends Banker Meetings: Never    Marital Status: Married  Catering manager Violence: Not At Risk (11/04/2023)   Humiliation, Afraid, Rape, and Kick questionnaire    Fear of Current or Ex-Partner: No    Emotionally Abused: No    Physically Abused: No    Sexually Abused: No    Medications:   Current Outpatient Medications on File Prior to Visit  Medication Sig Dispense Refill   amLODipine  (NORVASC ) 10 MG tablet Take 10 mg by mouth daily.     atorvastatin  (LIPITOR) 80 MG tablet Take 80 mg by mouth daily.     Cholecalciferol  (VITAMIN D ) 50 MCG (2000 UT) CAPS Take 2,000 Units by mouth daily.     clopidogrel  (PLAVIX ) 75 MG tablet Take 1 tablet (75 mg total) by mouth daily. 30 tablet 1   docusate sodium  (COLACE) 100 MG capsule Take 100 mg by mouth daily.     ferrous sulfate  325 (65 FE) MG tablet Take 325 mg by mouth daily with breakfast.     Magnesium  250 MG TABS Take 250 mg by mouth daily.     meloxicam (MOBIC) 15 MG tablet Take 15 mg by mouth daily.     Multiple Vitamin (MULTIVITAMIN) tablet Take 1  tablet by mouth daily. 50+     nitroGLYCERIN  (NITROSTAT ) 0.4 MG SL tablet Dissolve 1 tablet under the tongue every 5 minutes as needed for chest pain. Max of 3 doses, then 911. 25 tablet 6   oxyCODONE -acetaminophen  (PERCOCET/ROXICET) 5-325 MG tablet Take 1 tablet by mouth 4 (four) times daily as needed.     vitamin B-12 (CYANOCOBALAMIN ) 1000 MCG tablet Take 1,000 mcg by mouth daily.     No  current facility-administered medications on file prior to visit.    Allergies:  No Known Allergies  Physical Exam General: well developed, well nourished, seated, in no evident distress Head: head normocephalic and atraumatic.  Neck: supple with no carotid or supraclavicular bruits Cardiovascular: regular rate and rhythm, no murmurs Musculoskeletal: no deformity Skin:  no rash/petichiae Vascular:  Normal pulses all extremities Vitals:   01/22/24 0831  BP: (!) 140/83  Pulse: 83   Neurologic Exam Mental Status: Awake and fully alert. Oriented to place and time. Recent and remote memory intact. Attention span, concentration and fund of knowledge appropriate. Mood and affect appropriate.  Cranial Nerves: Fundoscopic exam reveals sharp disc margins. Pupils equal, briskly reactive to light. Extraocular movements full without nystagmus. Visual fields full to confrontation. Hearing intact. Facial sensation intact. Face, tongue, palate moves normally and symmetrically.  Motor: Normal bulk and tone. Normal strength in all tested extremity muscles. Sensory.: intact to touch ,pinprick .position and vibratory sensation.  Coordination: Rapid alternating movements normal in all extremities. Finger-to-nose and heel-to-shin performed accurately bilaterally. Gait and Station: Arises from chair without difficulty. Stance is normal. Gait demonstrates normal stride length and balance . Able to heel, toe and tandem walk without difficulty.  Reflexes: 1+ and symmetric. Toes downgoing.   NIHSS  0 Modified Rankin   1   ASSESSMENT: 73 year old African-American male with left-sided numbness in April 2025 likely right hemispheric TIA along with silent right occipital infarct likely from small vessel disease.  Vascular risk factors of hypertension ,hyperlipidemia, intracranial stenosis and age.     PLAN:I had a long d/w patient about his recent  silent stroke, TIA,risk for recurrent stroke/TIAs, personally independently reviewed imaging studies and stroke evaluation results and answered questions.Continue Plavix  75 mg daily  for secondary stroke prevention and maintain strict control of hypertension with blood pressure goal below 130/90, diabetes with hemoglobin A1c goal below 6.5% and lipids with LDL cholesterol goal below 70 mg/dL. I also advised the patient to eat a healthy diet with plenty of whole grains, cereals, fruits and vegetables, exercise regularly and maintain ideal body weight Followup in the future with my nurse practitioner in 6 months or call earlier if needed.  Greater than 50% of time during this 35 minute visit was spent on counseling,explanation of diagnosis of TIA and intracranial stenosis, planning of further management, discussion with patient and family and coordination of care Eather Popp, MD Note: This document was prepared with digital dictation and possible smart phrase technology. Any transcriptional errors that result from this process are unintentional

## 2024-02-28 ENCOUNTER — Other Ambulatory Visit: Payer: Self-pay | Admitting: Physician Assistant

## 2024-03-13 ENCOUNTER — Ambulatory Visit (INDEPENDENT_AMBULATORY_CARE_PROVIDER_SITE_OTHER): Admitting: Podiatry

## 2024-03-13 ENCOUNTER — Ambulatory Visit (INDEPENDENT_AMBULATORY_CARE_PROVIDER_SITE_OTHER)

## 2024-03-13 ENCOUNTER — Encounter: Payer: Self-pay | Admitting: Podiatry

## 2024-03-13 VITALS — Ht 72.0 in | Wt 203.0 lb

## 2024-03-13 DIAGNOSIS — I83229 Varicose veins of left lower extremity with both ulcer of unspecified site and inflammation: Secondary | ICD-10-CM

## 2024-03-13 DIAGNOSIS — B351 Tinea unguium: Secondary | ICD-10-CM | POA: Diagnosis not present

## 2024-03-13 DIAGNOSIS — I83219 Varicose veins of right lower extremity with both ulcer of unspecified site and inflammation: Secondary | ICD-10-CM

## 2024-03-13 DIAGNOSIS — L97919 Non-pressure chronic ulcer of unspecified part of right lower leg with unspecified severity: Secondary | ICD-10-CM | POA: Diagnosis not present

## 2024-03-13 DIAGNOSIS — L97929 Non-pressure chronic ulcer of unspecified part of left lower leg with unspecified severity: Secondary | ICD-10-CM | POA: Diagnosis not present

## 2024-03-13 DIAGNOSIS — L97521 Non-pressure chronic ulcer of other part of left foot limited to breakdown of skin: Secondary | ICD-10-CM

## 2024-03-13 MED ORDER — EFINACONAZOLE 10 % EX SOLN: 1.0000 [drp] | Freq: Every day | CUTANEOUS | 11 refills | Status: DC

## 2024-03-13 MED ORDER — CEPHALEXIN 500 MG PO CAPS: 500.0000 mg | ORAL_CAPSULE | Freq: Three times a day (TID) | ORAL | 0 refills | Status: DC

## 2024-03-13 MED ORDER — MUPIROCIN 2 % EX OINT: 1.0000 | TOPICAL_OINTMENT | Freq: Two times a day (BID) | CUTANEOUS | 2 refills | Status: DC

## 2024-03-13 NOTE — Patient Instructions (Signed)
 Start antibiotics, keflex  that was sent over.  Wash the area with soap and water  daily.  Apply mupirocin  ointment followed by dressing. Monitor for any signs/symptoms of infection. Call the office immediately if any occur or go directly to the emergency room. Call with any questions/concerns.  --  Efinaconazole  Topical solution What is this medication? EFINACONAZOLE  (e FEE na KON a zole) treats fungal infections of the nails. It belongs to a group of medications called antifungals. It will not treat infections caused by bacteria or viruses. This medicine may be used for other purposes; ask your health care provider or pharmacist if you have questions. COMMON BRAND NAME(S): JUBLIA  What should I tell my care team before I take this medication? They need to know if you have any of these conditions: An unusual or allergic reaction to efinaconazole , other medications, foods, dyes or preservatives Pregnant or trying to get pregnant Breast-feeding How should I use this medication? This medication is for external use only. Do not take by mouth. Wash your hands before and after use. Do not get it in your eyes. If you do, rinse your eyes with plenty of cool tap water . Use it as directed on the prescription label. Do not use it more often than directed. Use the medication for the full course as directed by your care team, even if you think you are better. Do not stop using it unless your care team tells you to stop it early. This medication comes with INSTRUCTIONS FOR USE. Ask your pharmacist for directions on how to use this medication. Read the information carefully. Talk to your pharmacist or care team if you have questions. Talk to your care team about the use of this medication in children. While it may be prescribed for children as young as 6 years for selected conditions, precautions do apply. Overdosage: If you think you have taken too much of this medicine contact a poison control center or  emergency room at once. NOTE: This medicine is only for you. Do not share this medicine with others. What if I miss a dose? If you miss a dose, use it as soon as you can. If it is almost time for your next dose, use only that dose. Do not use double or extra doses. What may interact with this medication? Interactions are not expected. Do not use any other skin products on the same area of skin without talking to your care team. This list may not describe all possible interactions. Give your health care provider a list of all the medicines, herbs, non-prescription drugs, or dietary supplements you use. Also tell them if you smoke, drink alcohol, or use illegal drugs. Some items may interact with your medicine. What should I watch for while using this medication? Visit your care team for regular checks on your progress. It may be some time before you see the benefit from this medication. Do not use nail polish or other nail cosmetic products on the treated nails. What side effects may I notice from receiving this medication? Side effects that you should report to your care team as soon as possible: Allergic reactions--skin rash, itching, hives, swelling of the face, lips, tongue, or throat Side effects that usually do not require medical attention (report to your care team if they continue or are bothersome): Ingrown nails Mild skin irritation, redness, or dryness This list may not describe all possible side effects. Call your doctor for medical advice about side effects. You may report side effects to FDA  at 1-800-FDA-1088. Where should I keep my medication? Keep out of the reach of children and pets. Store at room temperature between 20 and 25 degrees C (68 and 77 degrees F). Do not freeze. Keep the container tightly closed. Get rid of any unused medication after the expiration date. This medication is flammable. Avoid exposure to heat, flame, and smoking. To get rid of medications that are no  longer needed or have expired: Take the medication to a medication take-back program. Check with your pharmacy or law enforcement to find a location. If you cannot return the medication, ask your pharmacist or care team how to get rid of this medication safely. NOTE: This sheet is a summary. It may not cover all possible information. If you have questions about this medicine, talk to your doctor, pharmacist, or health care provider.  2024 Elsevier/Gold Standard (2021-09-26 00:00:00)

## 2024-03-15 NOTE — Progress Notes (Signed)
 Subjective:   Patient ID: Edward [name], male   DOB: 73 y.o.   MRN: [id number]   HPI Chief Complaint  Patient presents with   Callouses    Pt is here due to spot on the left foot, possible ulcer.   73 year old male presents the office today with concerns of a spot on the bottom of his left second toe.  He does not recall any injuries or stepping any foreign objects.  He is not sure of how this started.  He has not had any recent treatment.  No swelling, redness or any drainage.    Also has concerns about nail fungus.  No recent treatment for this.   Review of Systems  All other systems reviewed and are negative.  Past Medical History:  Diagnosis Date   Arthritis    Chicken pox    Coronary artery disease    a. 1999 s/p MI with cath/PCI;  b. 05/1999 Ex Cardiolite EF 68%, no ishcemia.   Dyslipidemia    Family history of breast cancer    Family history of oral cancer    History of tobacco abuse    Hypertension    Hypokalemia    Injury of cervical spine (HCC)    a. 02/2012 C4/5   Measles    Mumps    Myocardial infarction [facility])    Nasal bones, closed fracture    a. 02/2012 in setting of presyncope/fall   Near syncope    Pneumonia    pt denies   Prostate cancer [facility])    Sciatica    Whooping cough     Past Surgical History:  Procedure Laterality Date   ACHILLES TENDON SURGERY  08/01/1987   ANTERIOR CERVICAL DECOMP/DISCECTOMY FUSION  03/13/2012   Procedure: ANTERIOR CERVICAL DECOMPRESSION/DISCECTOMY FUSION 1 LEVEL;  Surgeon: Fairy Levels, MD;  Location: [facility];  Service: Neurosurgery;  Laterality: N/A;  Anterior Cervical Decompression/Fusion. Cervical four-five.   CARDIAC CATHETERIZATION  06/28/1998   single vessel CAD involving the distal RCA/PTCA and stenting of the distal RCA//EF- 50-55%   CLOSED REDUCTION NASAL FRACTURE  03/13/2012   Procedure: CLOSED REDUCTION NASAL FRACTURE;  Surgeon: Estefana Reichert, DO;  Location: [facility];  Service: Plastics;  Laterality:  N/A;  Internal and external splinting of nasal fracture   eye vein bleed      leaky vein on retina   LYMPHADENECTOMY Bilateral 02/14/2018   Procedure: [facility];  Surgeon: Renda Glance, MD;  Location: [facility];  Service: Urology;  Laterality: Bilateral;   NM MYOVIEW  LTD  05/17/2009   normal stress nuclear study/no evidence of ischemia/EF- 68%   petalla tendon surgery  08/01/1987   ROBOT ASSISTED LAPAROSCOPIC RADICAL PROSTATECTOMY N/A 02/14/2018   Procedure: XI ROBOTIC ASSISTED LAPAROSCOPIC RADICAL PROSTATECTOMY;  Surgeon: Renda Glance, MD;  Location: [facility] ORS;  Service: Urology;  Laterality: N/A;   SHOULDER ARTHROSCOPY WITH OPEN ROTATOR CUFF REPAIR Left 04/09/2023   Procedure: SHOULDER ARTHROSCOPY WITH SUBACROMIAL DECOMPRESSION, DISTAL CLAVICAL RESECTION, BICEPS TENOTOMY, EXTENSIVE DEBRIDMENT OF ROTATOR CUFF TEAR;  Surgeon: Yvone Rush, MD;  Location: [facility] ORS;  Service: Orthopedics;  Laterality: Left;   TOTAL KNEE ARTHROPLASTY Right 09/26/2019   Procedure: TOTAL KNEE ARTHROPLASTY;  Surgeon: Yvone Rush, MD;  Location: [facility];  Service: Orthopedics;  Laterality: Right;     Current Outpatient Medications:    amLODipine  (NORVASC ) 10 MG tablet, Take 10 mg by mouth daily., Disp: , Rfl:    atorvastatin  (LIPITOR) 80 MG tablet, Take 80 mg by mouth daily., Disp: ,  Rfl:    cephALEXin  (KEFLEX ) 500 MG capsule, Take 1 capsule (500 mg total) by mouth 3 (three) times daily., Disp: 21 capsule, Rfl: 0   Cholecalciferol  (VITAMIN D ) 50 MCG (2000 UT) CAPS, Take 2,000 Units by mouth daily., Disp: , Rfl:    clopidogrel  (PLAVIX ) 75 MG tablet, TAKE 1 TABLET BY MOUTH DAILY, Disp: 90 tablet, Rfl: 0   docusate sodium  (COLACE) 100 MG capsule, Take 100 mg by mouth daily., Disp: , Rfl:    Efinaconazole  10 % SOLN, Apply 1 drop topically daily., Disp: 4 mL, Rfl: 11   ferrous sulfate  325 (65 FE) MG tablet, Take 325 mg by mouth daily with breakfast., Disp: , Rfl:    Magnesium  250 MG TABS, Take 250 mg by mouth  daily., Disp: , Rfl:    meloxicam (MOBIC) 15 MG tablet, Take 15 mg by mouth daily., Disp: , Rfl:    Multiple Vitamin (MULTIVITAMIN) tablet, Take 1 tablet by mouth daily. 50+, Disp: , Rfl:    mupirocin  ointment (BACTROBAN ) 2 %, Apply 1 Application topically 2 (two) times daily., Disp: 30 g, Rfl: 2   nitroGLYCERIN  (NITROSTAT ) 0.4 MG SL tablet, Dissolve 1 tablet under the tongue every 5 minutes as needed for chest pain. Max of 3 doses, then 911., Disp: 25 tablet, Rfl: 6   oxyCODONE -acetaminophen  (PERCOCET/ROXICET) 5-325 MG tablet, Take 1 tablet by mouth 4 (four) times daily as needed., Disp: , Rfl:    vitamin B-12 (CYANOCOBALAMIN ) 1000 MCG tablet, Take 1,000 mcg by mouth daily., Disp: , Rfl:   No Known Allergies        Objective:  Physical Exam  General: AAO x3, NAD  Dermatological: As pictured below there is a hyperkeratotic, scab to superficial area of skin breakdown noted as pictured below as well.  There is no probing, undermining or tunneling.  There is no surrounding erythema, ascending cellulitis.  No malodor.  Nails are hypertrophic, dystrophic with yellow, brown discoloration.      Vascular: Dorsalis Pedis artery and Posterior Tibial artery pedal pulses are palpable bilateral with immedate capillary fill time.There is no pain with calf compression, swelling, warmth, erythema.   Neruologic: Grossly intact via light touch bilateral.   Musculoskeletal: No pain to the area.  Gait: Unassisted, Nonantalgic.       Assessment:   Skin lesion, ulcer left second toe     Plan:  -Treatment options discussed including all alternatives, risks, and complications -Etiology of symptoms were discussed -X-rays were obtained and reviewed with the patient.  3 views of the foot were obtained.  No evidence of acute fracture.  No cortical changes suggest osteomyelitis.  No soft tissue emphysema.  No foreign object. -Sharply debrided the skin lesion without any complications or bleeding within  #312 with scalpel as pictured above. -Prescribe mupirocin  ointment. -Keflex   -For the toenail fungus prescribed Jublia .  Return for toe ulcer in 2-3 weeks.     Donnice JONELLE Fees DPM

## 2024-04-07 ENCOUNTER — Ambulatory Visit (INDEPENDENT_AMBULATORY_CARE_PROVIDER_SITE_OTHER): Admitting: Podiatry

## 2024-04-07 DIAGNOSIS — L97521 Non-pressure chronic ulcer of other part of left foot limited to breakdown of skin: Secondary | ICD-10-CM | POA: Diagnosis not present

## 2024-04-07 DIAGNOSIS — M79671 Pain in right foot: Secondary | ICD-10-CM | POA: Diagnosis not present

## 2024-04-07 DIAGNOSIS — B351 Tinea unguium: Secondary | ICD-10-CM | POA: Diagnosis not present

## 2024-04-07 DIAGNOSIS — Z860101 Personal history of adenomatous and serrated colon polyps: Secondary | ICD-10-CM | POA: Insufficient documentation

## 2024-04-07 DIAGNOSIS — G629 Polyneuropathy, unspecified: Secondary | ICD-10-CM | POA: Insufficient documentation

## 2024-04-07 NOTE — Progress Notes (Signed)
 Subjective:   Patient ID: Edward [name] Solian, male   DOB: 73 y.o.   MRN: [id number]   HPI Chief Complaint  Patient presents with   Toe ulcer, left    Pt stated that he feels like everything is better he stated that he got a toe spacer for his foot and he feels like that has helped some, he mentioned that he got some new shoes     73 year old male presents the office today for follow-up evaluation of a spot on the bottom of his left second toe.  He states has been doing well and this is resolved.  No swelling or redness or drainage.  Also getting heel pain on the right heel.  He has a callus on the bottom of the heel as well.  No injuries that he reports.  No swelling.    Review of Systems  All other systems reviewed and are negative.    Objective:  Physical Exam  General: AAO x3, NAD  Dermatological: There on the plantar aspect the second toe is resolved.  There is no open lesions identified there is no edema, erythema.  The right plantar heel is a hyperkeratotic lesion without any underlying ulceration, drainage or signs of infection.  No open lesions.  Nails unchanged  Vascular: Dorsalis Pedis artery and Posterior Tibial artery pedal pulses are palpable bilateral with immedate capillary fill time.There is no pain with calf compression, swelling, warmth, erythema.   Neruologic: Grossly intact via light touch bilateral.   Musculoskeletal: There is tenderness palpation of the plantar aspect the right heel.  There is atrophy of the fat pad noted.  Appears to be a bursa palpable underneath this area as well.  There is no pain with lateral compression of the calcaneus.  No edema, erythema.  Gait: Unassisted, Nonantalgic.       Assessment:   Skin lesion, ulcer left second toe-resolved; right heel pain     Plan:  Skin lesion left foot, healed - No signs of infection of the wound is healed at this time.  Monitor for any signs or symptoms of infection or reoccurrence.  Right heel  pain -As a courtesy I debrided the callus with any complications or bleeding.  Discussed shoes, inserts to help offload.  Discussed topical medications such as Voltaren gel.  Consider steroid injection if needed.  X-ray if symptoms persist.  Onychomycosis - Continue Jublia   Return if symptoms worsen or fail to improve.  Donnice JONELLE Fees DPM

## 2024-04-07 NOTE — Patient Instructions (Signed)

## 2024-05-14 ENCOUNTER — Encounter (HOSPITAL_BASED_OUTPATIENT_CLINIC_OR_DEPARTMENT_OTHER): Payer: Self-pay | Admitting: Emergency Medicine

## 2024-05-14 ENCOUNTER — Emergency Department (HOSPITAL_BASED_OUTPATIENT_CLINIC_OR_DEPARTMENT_OTHER)
Admission: EM | Admit: 2024-05-14 | Discharge: 2024-05-14 | Disposition: A | Discharge status: Home / Self Care | Attending: Emergency Medicine | Admitting: Emergency Medicine

## 2024-05-14 ENCOUNTER — Other Ambulatory Visit: Payer: Self-pay

## 2024-05-14 DIAGNOSIS — Z8673 Personal history of transient ischemic attack (TIA), and cerebral infarction without residual deficits: Secondary | ICD-10-CM | POA: Diagnosis not present

## 2024-05-14 DIAGNOSIS — I10 Essential (primary) hypertension: Secondary | ICD-10-CM | POA: Insufficient documentation

## 2024-05-14 DIAGNOSIS — Z87442 Personal history of urinary calculi: Secondary | ICD-10-CM | POA: Insufficient documentation

## 2024-05-14 DIAGNOSIS — M5431 Sciatica, right side: Secondary | ICD-10-CM

## 2024-05-14 DIAGNOSIS — M545 Low back pain, unspecified: Secondary | ICD-10-CM | POA: Diagnosis present

## 2024-05-14 DIAGNOSIS — Z79899 Other long term (current) drug therapy: Secondary | ICD-10-CM | POA: Insufficient documentation

## 2024-05-14 DIAGNOSIS — M5441 Lumbago with sciatica, right side: Secondary | ICD-10-CM | POA: Insufficient documentation

## 2024-05-14 DIAGNOSIS — Z7901 Long term (current) use of anticoagulants: Secondary | ICD-10-CM | POA: Diagnosis not present

## 2024-05-14 LAB — CBC WITH DIFFERENTIAL/PLATELET
Abs Immature Granulocytes: 0.01 K/uL (ref 0.00–0.07)
Basophils Absolute: 0 K/uL (ref 0.0–0.1)
Basophils Relative: 1 %
Eosinophils Absolute: 0.1 K/uL (ref 0.0–0.5)
Eosinophils Relative: 2 %
HCT: 38.9 % — ABNORMAL LOW (ref 39.0–52.0)
Hemoglobin: 13.5 g/dL (ref 13.0–17.0)
Immature Granulocytes: 0 %
Lymphocytes Relative: 37 %
Lymphs Abs: 1.7 K/uL (ref 0.7–4.0)
MCH: 31.9 pg (ref 26.0–34.0)
MCHC: 34.7 g/dL (ref 30.0–36.0)
MCV: 92 fL (ref 80.0–100.0)
Monocytes Absolute: 0.5 K/uL (ref 0.1–1.0)
Monocytes Relative: 11 %
Neutro Abs: 2.2 K/uL (ref 1.7–7.7)
Neutrophils Relative %: 49 %
Platelets: 222 K/uL (ref 150–400)
RBC: 4.23 MIL/uL (ref 4.22–5.81)
RDW: 13 % (ref 11.5–15.5)
WBC: 4.5 K/uL (ref 4.0–10.5)
nRBC: 0 % (ref 0.0–0.2)

## 2024-05-14 LAB — BASIC METABOLIC PANEL WITH GFR
Anion gap: 10 (ref 5–15)
BUN: 12 mg/dL (ref 8–23)
CO2: 26 mmol/L (ref 22–32)
Calcium: 9.1 mg/dL (ref 8.9–10.3)
Chloride: 105 mmol/L (ref 98–111)
Creatinine, Ser: 0.8 mg/dL (ref 0.61–1.24)
GFR, Estimated: >60 mL/min (ref >60)
Glucose, Bld: 91 mg/dL (ref 70–99)
Potassium: 3.8 mmol/L (ref 3.5–5.1)
Sodium: 141 mmol/L (ref 135–145)

## 2024-05-14 LAB — URINALYSIS, ROUTINE W REFLEX MICROSCOPIC
Bilirubin Urine: NEGATIVE
Glucose, UA: NEGATIVE mg/dL
Hgb urine dipstick: NEGATIVE
Ketones, ur: NEGATIVE mg/dL
Leukocytes,Ua: NEGATIVE
Nitrite: NEGATIVE
Protein, ur: NEGATIVE mg/dL
Specific Gravity, Urine: 1.015 (ref 1.005–1.030)
pH: 8 (ref 5.0–8.0)

## 2024-05-14 MED ORDER — CYCLOBENZAPRINE HCL 10 MG PO TABS: 10.0000 mg | ORAL_TABLET | Freq: Two times a day (BID) | ORAL | 0 refills | Status: AC | PRN

## 2024-05-14 MED ORDER — CYCLOBENZAPRINE HCL 5 MG PO TABS
5.0000 mg | ORAL_TABLET | Freq: Once | ORAL | Status: AC
  Administered 2024-05-14: 5 mg via ORAL
  Filled 2024-05-14: qty 1

## 2024-05-14 MED ORDER — SODIUM CHLORIDE 0.9 % IV BOLUS
1000.0000 mL | Freq: Once | INTRAVENOUS | Status: AC
  Administered 2024-05-14: 1000 mL via INTRAVENOUS

## 2024-05-14 MED ORDER — LIDOCAINE 5 % EX PTCH
1.0000 | MEDICATED_PATCH | CUTANEOUS | Status: DC
  Administered 2024-05-14: 1 via TRANSDERMAL
  Filled 2024-05-14: qty 1

## 2024-05-14 NOTE — ED Provider Notes (Signed)
 [place] [facility] Provider Note   CSN: [id number] Arrival date & time: 05/14/24  1527     Patient presents with: Back Pain   Edward [name] is a 73 y.o. male.  With a history of sciatica, hypertension, TIA and kidney stone who presents to the ED for back pain.  Patient had hematuria and was diagnosed with a small kidney stone last month.  Since that time he has had intermittent right flank pain.  Was not given any analgesic prescriptions for this.  Did see urology in the office and they told him it would likely pass on its own.  For the last week right lower back pain has been worsening with radiation down the right leg.  Pain made worse with moving the right leg and ambulation.  Pain localized over right lower lumbar and right buttock.  No fevers, chills nausea vomiting recent back surgeries.  States the right lower extremity is painful but is not significantly weaker    Back Pain      Prior to Admission medications   Medication Sig Start Date End Date Taking? Authorizing Provider  cyclobenzaprine (FLEXERIL) 10 MG tablet Take 1 tablet (10 mg total) by mouth 2 (two) times daily as needed for up to 3 days for muscle spasms. 05/14/24 05/17/24 Yes Pamella Ozell LABOR, DO  amLODipine  (NORVASC ) 10 MG tablet Take 10 mg by mouth daily.    [provider]  atorvastatin  (LIPITOR) 80 MG tablet Take 80 mg by mouth daily.    [provider]  cephALEXin  (KEFLEX ) 500 MG capsule Take 1 capsule (500 mg total) by mouth 3 (three) times daily. 03/13/24   Gershon Donnice SAUNDERS, DPM  Cholecalciferol  (VITAMIN D ) 50 MCG (2000 UT) CAPS Take 2,000 Units by mouth daily.    [provider]  clopidogrel  (PLAVIX ) 75 MG tablet TAKE 1 TABLET BY MOUTH DAILY 02/29/24   Lelon Hamilton T, PA-C  docusate sodium  (COLACE) 100 MG capsule Take 100 mg by mouth daily.    [provider]  Efinaconazole  10 % SOLN Apply 1 drop topically daily. 03/13/24   Gershon Donnice SAUNDERS, DPM  ferrous sulfate  325 (65 FE) MG tablet Take 325 mg by mouth daily with breakfast.    [provider]  hydrochlorothiazide  (HYDRODIURIL ) 25 MG tablet 1 tablet in the morning Orally Once a day; Duration: 90 days 02/23/21   [provider]  Magnesium  250 MG TABS Take 250 mg by mouth daily.    [provider]  meloxicam (MOBIC) 15 MG tablet Take 15 mg by mouth daily. 12/30/23   [provider]  Multiple Vitamin (MULTIVITAMIN) tablet Take 1 tablet by mouth daily. 50+    [provider]  mupirocin  ointment (BACTROBAN ) 2 % Apply 1 Application topically 2 (two) times daily. 03/13/24   Gershon Donnice SAUNDERS, DPM  nitroGLYCERIN  (NITROSTAT ) 0.4 MG SL tablet Dissolve 1 tablet under the tongue every 5 minutes as needed for chest pain. Max of 3 doses, then 911. 01/16/23   Nahser, Aleene PARAS, MD  oxyCODONE -acetaminophen  (PERCOCET/ROXICET) 5-325 MG tablet Take 1 tablet by mouth 4 (four) times daily as needed. 12/23/23   [provider]  vitamin B-12 (CYANOCOBALAMIN ) 1000 MCG tablet Take 1,000 mcg by mouth daily.    [provider]    Allergies: Patient has no known allergies.    Review of Systems  Musculoskeletal:  Positive for back pain.    Updated Vital Signs BP (!) 161/86 (BP Location: Right  Arm)   Pulse 72   Temp 98 F (36.7 C)   Resp 20   Wt 86.2 kg   SpO2 98%   BMI 25.77 kg/m   Physical Exam Vitals and nursing note reviewed.  HENT:     Head: Normocephalic and atraumatic.  Eyes:     Pupils: Pupils are equal, round, and reactive to light.  Cardiovascular:     Rate and Rhythm: Normal rate and regular rhythm.  Pulmonary:     Effort: Pulmonary effort is normal.     Breath sounds: Normal breath sounds.  Abdominal:     Palpations: Abdomen is soft.     Tenderness: There is no abdominal tenderness. There is no right CVA tenderness or left CVA tenderness.  Musculoskeletal:     Comments: 5 out of 5 motor strength bilateral  upper and lower extremities Sensation intact to touch throughout 2+ DP pulses over bilateral lower extremities No midline tenderness of lumbar spine No step-off deformity Tenderness over right paraspinal lumbar region and right buttock  Skin:    General: Skin is warm and dry.  Neurological:     Mental Status: He is alert.  Psychiatric:        Mood and Affect: Mood normal.     (all labs ordered are listed, but only abnormal results are displayed) Labs Reviewed  CBC WITH DIFFERENTIAL/PLATELET - Abnormal; Notable for the following components:      Result Value   HCT 38.9 (*)    All other components within normal limits  BASIC METABOLIC PANEL WITH GFR  URINALYSIS, ROUTINE W REFLEX MICROSCOPIC    EKG: None  Radiology: No results found.   Procedures   Medications Ordered in the ED  lidocaine  (LIDODERM ) 5 % 1 patch (1 patch Transdermal Patch Applied 05/14/24 1702)  sodium chloride  0.9 % bolus 1,000 mL (0 mLs Intravenous Stopped 05/14/24 1806)  cyclobenzaprine (FLEXERIL) tablet 5 mg (5 mg Oral Given 05/14/24 1701)    Clinical Course as of 05/14/24 1838  Wed May 14, 2024  1832 .  No evidence of kidney injury.  Patient reports symptomatic relief after interventions here.  Will give him a few days of Flexeril to go home with instruction for PCP follow-up [MP]    Clinical Course User Index [MP] Dariusz, Brase, DO                                 Medical Decision Making 73 year old male with history as above presenting for acute on chronic right back pain right lower extremity pain x 1 week.  Recently seen for hematuria and found to have small kidney stone on the right side has already seen urology.  No intervention necessary.  Now here with right lower back pain.  No midline tenderness focal neurologic deficit, sensory deficits or asymmetric motor weakness on my exam although it is painful in the right lower back when he moves the right lower extremity especially with hip  flexion.  Suspect this is most likely sciatica flare however given recent kidney stone hematuria will obtain laboratory workup to evaluate for leukocytosis and renal impairment.  Will medicate with with IV fluids, Toradol , Flexeril and topical lidocaine  patch for multimodal approach to sciatica.  No red flag symptoms that would warrant CT or MRI at this time  Amount and/or Complexity of Data Reviewed Labs: ordered.  Risk Prescription drug management.        Final diagnoses:  Sciatica of right side    ED Discharge Orders          Ordered    cyclobenzaprine (FLEXERIL) 10 MG tablet  2 times daily PRN        05/14/24 1838               Jayon, Matton, DO 05/14/24 1838

## 2024-05-14 NOTE — Discharge Instructions (Signed)
 You were seen in the emergency ferment for sciatica Your urine and blood test looked okay There is no evidence of kidney stone Your symptoms improved after medications We gave you a few days of Flexeril to take from your pharmacy Do not drink alcohol or drive with taking Flexeril as this can make you sleepy Take only as needed for back pain and spasms Follow-up with your PCP within 1 week Return to the emergency room for severe pain if you are unable to walk or have any other concerns

## 2024-05-14 NOTE — ED Triage Notes (Signed)
 Reports right lower back pain since his kidney stone 1 month ago . 1 week ago started having worsening to right back radiating to right leg , unable to walk due to pain , reports weakness also to same limb x 1 week . Alert and oriented x 4 .  Hx sciatica

## 2024-05-31 ENCOUNTER — Telehealth: Payer: Self-pay | Admitting: Physician Assistant

## 2024-07-03 MED ORDER — AMLODIPINE BESYLATE 10 MG PO TABS: 10.0000 mg | ORAL_TABLET | Freq: Every day | ORAL | 0 refills | Status: AC

## 2024-07-03 NOTE — Addendum Note (Signed)
 Addended by: WILFRED, Trine Fread  C on: 07/03/2024 11:21 AM   Modules accepted: Orders

## 2024-07-03 NOTE — Telephone Encounter (Signed)
*  STAT* If patient is at the pharmacy, call can be transferred to refill team.   1. Which medications need to be refilled? (please list name of each medication and dose if known)   amLODipine  (NORVASC ) 10 MG tablet   2. Would you like to learn more about the convenience, safety, & potential cost savings by using the [facility]?   3. Are you open to using the [facility] (Type [facility]. ).   4. Which pharmacy/location (including street and city if local pharmacy) is medication to be sent to?  [facility] [id number] - [place], [place] - [address]   5. Do they need a 30 day or 90 day supply?   Patient stated he is completely out of this medication.  Patient has appointment scheduled on 1/8 with Dr. Michele.

## 2024-07-03 NOTE — Telephone Encounter (Signed)
 Requested Prescriptions   Signed Prescriptions Disp Refills   clopidogrel  (PLAVIX ) 75 MG tablet 90 tablet 2    Sig: TAKE 1 TABLET BY MOUTH DAILY    Authorizing Provider: WEAVER, SCOTT T    Ordering User: COOK, ERIC L   amLODipine  (NORVASC ) 10 MG tablet 90 tablet 0    Sig: Take 1 tablet (10 mg total) by mouth daily.    Authorizing Provider: INTERNATIONAL PAPER, [place] T    Ordering User: Herminia Warren  C

## 2024-08-07 ENCOUNTER — Ambulatory Visit: Attending: Cardiology | Admitting: Cardiology

## 2024-08-07 ENCOUNTER — Encounter: Payer: Self-pay | Admitting: Cardiology

## 2024-08-07 VITALS — BP 126/70 | HR 77 | Resp 16 | Ht 72.0 in | Wt 195.8 lb

## 2024-08-07 DIAGNOSIS — I252 Old myocardial infarction: Secondary | ICD-10-CM

## 2024-08-07 DIAGNOSIS — Z8673 Personal history of transient ischemic attack (TIA), and cerebral infarction without residual deficits: Secondary | ICD-10-CM

## 2024-08-07 DIAGNOSIS — Z955 Presence of coronary angioplasty implant and graft: Secondary | ICD-10-CM | POA: Diagnosis not present

## 2024-08-07 DIAGNOSIS — I10 Essential (primary) hypertension: Secondary | ICD-10-CM

## 2024-08-07 DIAGNOSIS — I2510 Atherosclerotic heart disease of native coronary artery without angina pectoris: Secondary | ICD-10-CM

## 2024-08-07 DIAGNOSIS — E7800 Pure hypercholesterolemia, unspecified: Secondary | ICD-10-CM | POA: Diagnosis not present

## 2024-08-07 NOTE — Patient Instructions (Signed)
 Medication Instructions:  Your physician recommends that you continue on your current medications as directed. Please refer to the Current Medication list given to you today.  *If you need a refill on your cardiac medications before your next appointment, please call your pharmacy*  Lab Work: None ordered If you have labs (blood work) drawn today and your tests are completely normal, you will receive your results only by: MyChart Message (if you have MyChart) OR A paper copy in the mail If you have any lab test that is abnormal or we need to change your treatment, we will call you to review the results.  Testing/Procedures: None ordered  Follow-Up: At [facility], you and your health needs are our priority.  As part of our continuing mission to provide you with exceptional heart care, our providers are all part of one team.  This team includes your primary Cardiologist (physician) and Advanced Practice Providers or APPs (Physician Assistants and Nurse Practitioners) who all work together to provide you with the care you need, when you need it.  Your next appointment:   1 year(s)  Provider:   Madonna Large, DO    We recommend signing up for the patient portal called MyChart.  Sign up information is provided on this After Visit Summary.  MyChart is used to connect with patients for Virtual Visits (Telemedicine).  Patients are able to view lab/test results, encounter notes, upcoming appointments, etc.  Non-urgent messages can be sent to your provider as well.   To learn more about what you can do with MyChart, go to [email or web address].

## 2024-08-07 NOTE — Progress Notes (Signed)
 " Cardiology Office Note:  .   Date:  08/07/2024  ID:  Ozell LITTIE Solian, DOB September 29, 1950, MRN [id number] PCP:  Claudene Round, MD  Former Cardiology Providers: Dr. Aleene Passe  [facility] Providers Cardiologist:  Madonna Large, DO, [facility] (established care 08/07/2024)  Cardiology APP:  Lelon Glendia DASEN, PA-C  Electrophysiologist:  None  Click to update primary MD,subspecialty MD or APP then REFRESH:1}    Chief Complaint  Patient presents with   Coronary artery disease involving native coronary artery of   Follow-up    History of Present Illness: Edward   LEWIS [name] is a 74 y.o. African-American male whose past medical history and cardiovascular risk factors includes: Coronary artery disease, history of inferior MI status post BMS to RCA, history of CVA, hypertension, hyperlipidemia, prostate cancer.  Formally under the care of Dr. Aleene Passe who last saw ANTONIE BORJON back in June 2024. I am seeing him for the first time to re-establishing care.   In April 2025 patient was admitted for right brain stroke with MRI findings of right occipital lobe CVA.  Neurology recommended dual antiplatelet therapy for the first 3 weeks and then clopidogrel  indefinitely.  Patient did undergo trans thoracic echocardiogram which noted preserved LVEF and outpatient cardiac monitor did not illustrate atrial fibrillation during the monitoring period.  He was last seen by Glendia Lelon in June 2025 and prior to that was under the care of of Dr. Passe he is here to reestablish care with myself.  Since last office visit Josph denies any anginal chest pain or heart failure symptoms.   No hospitalizations or urgent care visits for cardiovascular reasons.   He has been compliant with his medical therapy and endorses no concerns.  Physical endurance remains stable - walking about 4000 steps / day, yardwork, and coaches basketball. Home SBP ranges between <167mmHG.     Review of Systems: .   Review of  Systems  Cardiovascular:  Negative for chest pain, claudication, irregular heartbeat, leg swelling, near-syncope, orthopnea, palpitations, paroxysmal nocturnal dyspnea and syncope.  Respiratory:  Negative for shortness of breath.   Hematologic/Lymphatic: Negative for bleeding problem.    Studies Reviewed:   EKG: EKG Interpretation Date/Time:  Thursday August 07 2024 09:05:29 EST Ventricular Rate:  70 PR Interval:  206 QRS Duration:  90 QT Interval:  372 QTC Calculation: 401 R Axis:   -21  Text Interpretation: Sinus rhythm with marked sinus arrhythmia with occasional Premature ventricular complexes Possible Anterior infarct , age undetermined When compared with ECG of 03-Nov-2023 11:23, Premature ventricular complexes , new Since last tracing Otherwise no significant change Confirmed by Large Madonna ([id number]) on 08/07/2024 9:17:28 AM  Echocardiogram: April 2025 1. Left ventricular ejection fraction, by estimation, is 60 to 65%. The  left ventricle has normal function. The left ventricle has no regional  wall motion abnormalities. Left ventricular diastolic parameters are  consistent with Grade I diastolic  dysfunction (impaired relaxation).   2. Right ventricular systolic function is normal. The right ventricular  size is normal. There is normal pulmonary artery systolic pressure.   3. The mitral valve is normal in structure. No evidence of mitral valve  regurgitation. No evidence of mitral stenosis.   4. The aortic valve is tricuspid. There is mild calcification of the  aortic valve. There is mild thickening of the aortic valve. Aortic valve  regurgitation is not visualized. Aortic valve sclerosis is present, with  no evidence of aortic valve stenosis.   5. The  inferior vena cava is normal in size with greater than 50%  respiratory variability, suggesting right atrial pressure of 3 mmHg.    RADIOLOGY: MRI brain without contrast: April 2025: 1. 10 mm acute infarct in the  posterior right occipital lobe. 2. Mild atrophy and white matter disease. This likely reflects the sequela of chronic microvascular ischemia. 3. Fusion of the cervical spine at C3-4.  Risk Assessment/Calculations:   NA   Labs:       Latest Ref Rng & Units 05/14/2024    4:45 PM 11/04/2023    4:52 AM 11/03/2023   12:16 PM  CBC  WBC 4.0 - 10.5 K/uL 4.5  4.5    Hemoglobin 13.0 - 17.0 g/dL 86.4  85.9  87.7   Hematocrit 39.0 - 52.0 % 38.9  40.2  36.0   Platelets 150 - 400 K/uL 222  251         Latest Ref Rng & Units 05/14/2024    4:45 PM 11/04/2023    4:52 AM 11/03/2023   12:16 PM  BMP  Glucose 70 - 99 mg/dL 91  898  97   BUN 8 - 23 mg/dL 12  17  17    Creatinine 0.61 - 1.24 mg/dL 9.19  9.01  8.49   Sodium 135 - 145 mmol/L 141  140  139   Potassium 3.5 - 5.1 mmol/L 3.8  3.5  3.8   Chloride 98 - 111 mmol/L 105  106  104   CO2 22 - 32 mmol/L 26  24    Calcium  8.9 - 10.3 mg/dL 9.1  9.1        Latest Ref Rng & Units 05/14/2024    4:45 PM 11/04/2023    4:52 AM 11/03/2023   12:16 PM  CMP  Glucose 70 - 99 mg/dL 91  898  97   BUN 8 - 23 mg/dL 12  17  17    Creatinine 0.61 - 1.24 mg/dL 9.19  9.01  8.49   Sodium 135 - 145 mmol/L 141  140  139   Potassium 3.5 - 5.1 mmol/L 3.8  3.5  3.8   Chloride 98 - 111 mmol/L 105  106  104   CO2 22 - 32 mmol/L 26  24    Calcium  8.9 - 10.3 mg/dL 9.1  9.1      Lab Results  Component Value Date   CHOL 124 11/03/2023   HDL 37 (L) 11/03/2023   LDLCALC 68 11/03/2023   LDLDIRECT 81.6 07/01/2014   TRIG 93 11/03/2023   CHOLHDL 3.4 11/03/2023   No results for input(s): LIPOA in the last 8760 hours. No components found for: NTPROBNP No results for input(s): PROBNP in the last 8760 hours. Recent Labs    11/03/23 1604  TSH 0.619    Physical Exam:    Today's Vitals   08/07/24 0902  BP: 126/70  Pulse: 77  Resp: 16  SpO2: 97%  Weight: 195 lb 12.8 oz (88.8 kg)  Height: 6' (1.829 m)   Body mass index is 26.56 kg/m. Wt Readings from Last 3  Encounters:  08/07/24 195 lb 12.8 oz (88.8 kg)  05/14/24 190 lb (86.2 kg)  03/13/24 203 lb (92.1 kg)    Physical Exam  Constitutional: No distress.  hemodynamically stable  Neck: No JVD present.  Cardiovascular: Normal rate, regular rhythm, S1 normal and S2 normal. Exam reveals no gallop, no S3 and no S4.  No murmur heard. Pulmonary/Chest: Effort normal and breath sounds normal. No stridor.  He has no wheezes. He has no rales.  Musculoskeletal:        General: No edema.     Cervical back: Neck supple.  Skin: Skin is warm.   Impression & Recommendation(s):  Impression:   ICD-10-CM   1. Coronary artery disease involving native coronary artery of native heart without angina pectoris  I25.10 EKG 12-Lead    2. History of myocardial infarction  I25.2     3. S/p bare metal coronary artery stent  Z95.5     4. History of stroke  Z86.73     5. Primary hypertension  I10     6. Pure hypercholesterolemia  E78.00        Recommendation(s):  Coronary artery disease involving native coronary artery of native heart without angina pectoris History of myocardial infarction S/p bare metal coronary artery stent Denies anginal chest pain or heart failure symptoms. EKG is nonischemic. No additional diagnostic testing warranted at this time in an asymptomatic male. Continue Plavix  75 mg p.o. daily. Continue Lipitor 80 mg p.o. daily  History of stroke History of right posterior occipital lobe infarct. Thankfully no residual focal neurological deficits Follows up with neurology regularly. Continue antiplatelets and lipid-lowering agents Reemphasized importance of secondary prevention  Primary hypertension Office blood pressures are very well-controlled. Currently on amlodipine  10 mg p.o. daily. Patient states that he stopped hydrochlorothiazide  25 mg p.o. daily as his systolic blood pressures are consistently less than 120 mmHg after discussing with PCP.  Pure  hypercholesterolemia Continue Lipitor 80 mg p.o. nightly LDL 68 mg/dL as of April 7974. He will have his lipids rechecked with PCP if needed would recommend adding Zetia 10 mg p.o. daily to obtain a goal LDL <55 mg/dL given his history of CAD, prior coronary interventions, and recent stroke.  Patient agreeable with the plan of care.   Orders Placed:  Orders Placed This Encounter  Procedures   EKG 12-Lead     Final Medication List:   No orders of the defined types were placed in this encounter.   Medications Discontinued During This Encounter  Medication Reason   cephALEXin  (KEFLEX ) 500 MG capsule Patient Preference   Cholecalciferol  (VITAMIN D ) 50 MCG (2000 UT) CAPS Patient Preference   docusate sodium  (COLACE) 100 MG capsule Patient Preference   Efinaconazole  10 % SOLN Patient Preference   ferrous sulfate  325 (65 FE) MG tablet Patient Preference   hydrochlorothiazide  (HYDRODIURIL ) 25 MG tablet Patient Preference   Magnesium  250 MG TABS Patient Preference   meloxicam (MOBIC) 15 MG tablet Patient Preference   Multiple Vitamin (MULTIVITAMIN) tablet Patient Preference   mupirocin  ointment (BACTROBAN ) 2 % Patient Preference   nitroGLYCERIN  (NITROSTAT ) 0.4 MG SL tablet Patient Preference   oxyCODONE -acetaminophen  (PERCOCET/ROXICET) 5-325 MG tablet Patient Preference   vitamin B-12 (CYANOCOBALAMIN ) 1000 MCG tablet Patient Preference    Current Medications[1]  Consent:   NA  Disposition:   1 year follow-up sooner if needed  His questions and concerns were addressed to his satisfaction. He voices understanding of the recommendations provided during this encounter.    Signed, Madonna Michele HAS, [facility]  A Division of [facility] [address], [place], [place] [address]  08/07/2024 11:47 AM     [1]  Current Outpatient Medications:    amLODipine  (NORVASC ) 10 MG tablet, Take 1 tablet (10 mg total) by mouth daily., Disp: 90 tablet, Rfl: 0    atorvastatin  (LIPITOR) 80 MG tablet, Take 80 mg by mouth daily., Disp: , Rfl:  clopidogrel  (PLAVIX ) 75 MG tablet, TAKE 1 TABLET BY MOUTH DAILY, Disp: 90 tablet, Rfl: 2   latanoprost (XALATAN) 0.005 % ophthalmic solution, Place 1 drop into the left eye at bedtime., Disp: , Rfl:   "

## 2024-08-11 NOTE — Progress Notes (Unsigned)
 " [facility] [address] [place]. [place] [address] [phone]       OFFICE FOLLOW-UP NOTE  Edward [name] Date of Birth:  20-Aug-1950 Medical Record Number:  [id number]    Primary neurologist: Dr. Rosemarie Reason for visit: Stroke follow-up  No chief complaint on file.     HPI:   Update 08/12/2024 JM: Patient returns for follow-up visit.  Overall stable without new or reoccurring stroke/TIA symptoms.  Continues on Plavix  and atorvastatin  without side effects.  Routinely follows with PCP and cardiology.        Initial visit 01/22/2024 Dr. Rosemarie: Edward [name] is a 74 year old pleasant African-American male seen today for initial office follow-up visit following hospital consultation for stroke in April 2025.  History is obtained from the patient and review of electronic medical records and I personally reviewed pertinent available imaging films in PACS. He has past medical history of hypertension, hyperlipidemia, coronary artery disease, status post MI, sciatica prostate cancer.  Patient presented on 11/03/2023 for evaluation for sudden onset of left arm and leg weakness and unable to get up.  He also had a headache ongoing for the prior few days.  He denied any facial droop, slurred speech or vision changes.  CT head on admission showed no acute abnormalities and CT angiogram showed high-grade stenosis of the right P2 segment.  NIH stroke scale on admission was 1 only for left-sided sensory deficits.  MRI scan of the brain showed a 10 mm acute infarct involving posterior right occipital lobe which would not explain his symptoms.  His symptoms residual is resolved spontaneously while in the hospital.  Transthoracic echo showed ejection fraction of 60 to 65%.  Left atrial size was normal.  LDL cholesterol 68 mg percent.  Hemoglobin A1c was 5.6.  Telemetry monitoring during hospitalization did not show any paroxysmal A-fib.  Patient was discharged on aspirin  and Plavix   for 3 weeks followed by Plavix  alone.  He states he has done well since then.  His sensory symptoms are completely resolved.  He has had no recurrent stroke or TIA symptoms.  He remains on Plavix  which is tolerating well with minor bruising but no bleeding.  He states his blood pressure is under good control.  He is tolerating Lipitor well without muscle aches and pains.  He is retired.  He is independent in all activities of daily living.  He did undergo 30-day external heart monitor which showed no evidence of paroxysmal A-fib.  He does have a rotator cuff injury in the left shoulder as well as a sciatica and his back pain which seem to be his main complaints at this time   ROS:   14 system review of systems is positive for those listed in HPI and all other systems negative  PMH:  Past Medical History:  Diagnosis Date   Arthritis    Chicken pox    Coronary artery disease    a. 1999 s/p MI with cath/PCI;  b. 05/1999 Ex Cardiolite EF 68%, no ishcemia.   Dyslipidemia    Family history of breast cancer    Family history of oral cancer    History of tobacco abuse    Hypertension    Hypokalemia    Injury of cervical spine (HCC)    a. 02/2012 C4/5   Measles    Mumps    Myocardial infarction [facility])    Nasal bones, closed fracture    a. 02/2012 in setting of presyncope/fall   Near syncope  Pneumonia    pt denies   Prostate cancer (HCC)    Sciatica    Whooping cough     Social History:  Social History   Socioeconomic History   Marital status: Married    Spouse name: Not on file   Number of children: Not on file   Years of education: Not on file   Highest education level: Not on file  Occupational History   Occupation: paramedic    Comment: doctor, hospital  Tobacco Use   Smoking status: Former    Current packs/day: 0.00    Average packs/day: 1 pack/day for 20.0 years (20.0 ttl pk-yrs)    Types: Cigarettes    Start date: 11/01/1968    Quit date: 11/01/1988    Years since  quitting: 35.8   Smokeless tobacco: Never  Vaping Use   Vaping status: Never Used  Substance and Sexual Activity   Alcohol use: Not Currently    Comment: occasional alcoholic beverage.   Drug use: No   Sexual activity: Yes  Other Topics Concern   Not on file  Social History Narrative   Married and lives with wife in [place].  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  Exercises 2-3 days/wk without limitations.   Social Drivers of Health   Tobacco Use: Medium Risk (08/07/2024)   Patient History    Smoking Tobacco Use: Former    Smokeless Tobacco Use: Never    Passive Exposure: Not on file  Financial Resource Strain: Not on file  Food Insecurity: No Food Insecurity (11/04/2023)   Hunger Vital Sign    Worried About Running Out of Food in the Last Year: Never true    Ran Out of Food in the Last Year: Never true  Transportation Needs: No Transportation Needs (11/04/2023)   PRAPARE - Administrator, Civil Service (Medical): No    Lack of Transportation (Non-Medical): No  Physical Activity: Not on file  Stress: Not on file  Social Connections: Moderately Isolated (11/04/2023)   Social Connection and Isolation Panel    Frequency of Communication with Friends and Family: More than three times a week    Frequency of Social Gatherings with Friends and Family: More than three times a week    Attends Religious Services: Never    Database Administrator or Organizations: No    Attends Banker Meetings: Never    Marital Status: Married  Catering Manager Violence: Not At Risk (11/04/2023)   Humiliation, Afraid, Rape, and Kick questionnaire    Fear of Current or Ex-Partner: No    Emotionally Abused: No    Physically Abused: No    Sexually Abused: No  Depression (PHQ2-9): Low Risk (07/11/2022)   Depression (PHQ2-9)    PHQ-2 Score: 0  Alcohol Screen: Not on file  Housing: Low Risk (11/04/2023)   Housing Stability Vital Sign    Unable to Pay for Housing in the Last Year: No     Number of Times Moved in the Last Year: 0    Homeless in the Last Year: No  Utilities: Not At Risk (11/04/2023)   AHC Utilities    Threatened with loss of utilities: No  Health Literacy: Not on file    Medications:   Current Outpatient Medications on File Prior to Visit  Medication Sig Dispense Refill   amLODipine  (NORVASC ) 10 MG tablet Take 1 tablet (10 mg total) by mouth daily. 90 tablet 0   atorvastatin  (LIPITOR) 80 MG tablet Take 80  mg by mouth daily.     clopidogrel  (PLAVIX ) 75 MG tablet TAKE 1 TABLET BY MOUTH DAILY 90 tablet 2   latanoprost (XALATAN) 0.005 % ophthalmic solution Place 1 drop into the left eye at bedtime.     No current facility-administered medications on file prior to visit.    Allergies:  No Known Allergies  Physical Exam There were no vitals filed for this visit. There is no height or weight on file to calculate BMI.   General: well developed, well nourished, seated, in no evident distress Head: head normocephalic and atraumatic.  Neck: supple with no carotid or supraclavicular bruits Cardiovascular: regular rate and rhythm, no murmurs Musculoskeletal: no deformity Skin:  no rash/petichiae Vascular:  Normal pulses all extremities  Neurologic Exam Mental Status: Awake and fully alert. Oriented to place and time. Recent and remote memory intact. Attention span, concentration and fund of knowledge appropriate. Mood and affect appropriate.  Cranial Nerves: Pupils equal, briskly reactive to light. Extraocular movements full without nystagmus. Visual fields full to confrontation. Hearing intact. Facial sensation intact. Face, tongue, palate moves normally and symmetrically.  Motor: Normal bulk and tone. Normal strength in all tested extremity muscles. Sensory.: intact to touch ,pinprick .position and vibratory sensation.  Coordination: Rapid alternating movements normal in all extremities. Finger-to-nose and heel-to-shin performed accurately bilaterally. Gait  and Station: Arises from chair without difficulty. Stance is normal. Gait demonstrates normal stride length and balance . Able to heel, toe and tandem walk without difficulty.  Reflexes: 1+ and symmetric. Toes downgoing.        ASSESSMENT: 74 year old African-American male with left-sided numbness in April 2025 likely right hemispheric TIA along with silent right occipital infarct likely from small vessel disease.  Vascular risk factors of hypertension ,hyperlipidemia, intracranial stenosis and age.     PLAN:  - Continue Plavix  75 mg twice daily and atorvastatin  80 mg daily for secondary stroke prevention managed/prescribed by PCP - Continue close PCP follow-up for aggressive stroke risk factor management including BP goal<130/90 and HLD with LDL goal<70       I personally spent a total of *** minutes in the care of the patient today including {Time Based Coding:210964241}.  Harlene Bogaert, AGNP-BC  [facility] [address] [place], [place] [address]  Phone [phone] Fax [phone] Note: This document was prepared with digital dictation and possible smart phrase technology. Any transcriptional errors that result from this process are unintentional.   "

## 2024-08-12 ENCOUNTER — Encounter: Payer: Self-pay | Admitting: Adult Health

## 2024-08-12 ENCOUNTER — Ambulatory Visit: Admitting: Adult Health
